# Patient Record
Sex: Male | Born: 1955
Health system: Southern US, Community
[De-identification: ages and names within clinical notes are randomized; demographics above are authoritative.]

## PROBLEM LIST (undated history)

## (undated) DIAGNOSIS — G4733 Obstructive sleep apnea (adult) (pediatric): Secondary | ICD-10-CM

## (undated) DIAGNOSIS — Z87442 Personal history of urinary calculi: Secondary | ICD-10-CM

## (undated) DIAGNOSIS — S72002A Fracture of unspecified part of neck of left femur, initial encounter for closed fracture: Secondary | ICD-10-CM

## (undated) DIAGNOSIS — E785 Hyperlipidemia, unspecified: Secondary | ICD-10-CM

## (undated) DIAGNOSIS — F32A Depression, unspecified: Secondary | ICD-10-CM

## (undated) DIAGNOSIS — K5090 Crohn's disease, unspecified, without complications: Secondary | ICD-10-CM

## (undated) DIAGNOSIS — R0609 Other forms of dyspnea: Secondary | ICD-10-CM

## (undated) DIAGNOSIS — F329 Major depressive disorder, single episode, unspecified: Secondary | ICD-10-CM

## (undated) DIAGNOSIS — M25369 Other instability, unspecified knee: Secondary | ICD-10-CM

## (undated) DIAGNOSIS — K219 Gastro-esophageal reflux disease without esophagitis: Secondary | ICD-10-CM

## (undated) DIAGNOSIS — B009 Herpesviral infection, unspecified: Secondary | ICD-10-CM

## (undated) DIAGNOSIS — R0600 Dyspnea, unspecified: Secondary | ICD-10-CM

## (undated) DIAGNOSIS — I10 Essential (primary) hypertension: Secondary | ICD-10-CM

## (undated) DIAGNOSIS — M1990 Unspecified osteoarthritis, unspecified site: Secondary | ICD-10-CM

## (undated) DIAGNOSIS — R0683 Snoring: Secondary | ICD-10-CM

## (undated) HISTORY — DX: Snoring: R06.83

## (undated) HISTORY — DX: Other forms of dyspnea: R06.09

## (undated) HISTORY — DX: Fracture of unspecified part of neck of left femur, initial encounter for closed fracture: S72.002A

## (undated) HISTORY — DX: Herpesviral infection, unspecified: B00.9

## (undated) HISTORY — DX: Gastro-esophageal reflux disease without esophagitis: K21.9

## (undated) HISTORY — DX: Personal history of urinary calculi: Z87.442

## (undated) HISTORY — DX: Other instability, unspecified knee: M25.369

## (undated) HISTORY — DX: Essential (primary) hypertension: I10

## (undated) HISTORY — DX: Crohn's disease, unspecified, without complications: K50.90

## (undated) HISTORY — DX: Major depressive disorder, single episode, unspecified: F32.9

## (undated) HISTORY — DX: Hyperlipidemia, unspecified: E78.5

## (undated) HISTORY — DX: Dyspnea, unspecified: R06.00

## (undated) HISTORY — DX: Depression, unspecified: F32.A

---

## 2005-12-23 HISTORY — PX: COLONOSCOPY: SHX174

## 2005-12-23 HISTORY — PX: ESOPHAGOGASTRODUODENOSCOPY: SHX1529

## 2006-12-19 ENCOUNTER — Ambulatory Visit: Payer: Self-pay | Admitting: Family Medicine

## 2006-12-25 ENCOUNTER — Encounter (INDEPENDENT_AMBULATORY_CARE_PROVIDER_SITE_OTHER): Payer: Self-pay | Admitting: Family Medicine

## 2006-12-25 ENCOUNTER — Ambulatory Visit (HOSPITAL_COMMUNITY): Admission: RE | Admit: 2006-12-25 | Discharge: 2006-12-25 | Payer: Self-pay | Admitting: Family Medicine

## 2006-12-25 LAB — CONVERTED CEMR LAB
ALT: 20 units/L (ref 0–53)
AST: 19 units/L (ref 0–37)
Albumin: 4.6 g/dL (ref 3.5–5.2)
Alkaline Phosphatase: 67 units/L (ref 39–117)
BUN: 17 mg/dL (ref 6–23)
Basophils Absolute: 0 10*3/uL (ref 0.0–0.1)
Basophils Relative: 0 % (ref 0–1)
CO2: 31 meq/L (ref 19–32)
CRP: 0.1 mg/dL (ref <0.6)
Calcium: 9.8 mg/dL (ref 8.4–10.5)
Chloride: 102 meq/L (ref 96–112)
Cholesterol: 320 mg/dL — ABNORMAL HIGH (ref 0–200)
Creatinine, Ser: 1.1 mg/dL (ref 0.40–1.50)
Eosinophils Relative: 0 % (ref 0–5)
Glucose, Bld: 87 mg/dL (ref 70–99)
HCT: 48.9 % (ref 39.0–52.0)
HDL: 37 mg/dL — ABNORMAL LOW (ref >39)
Hemoglobin: 16.3 g/dL (ref 13.0–17.0)
LDL Cholesterol: 207 mg/dL — ABNORMAL HIGH (ref 0–99)
Lymphocytes Relative: 31 % (ref 12–46)
Lymphs Abs: 1.6 10*3/uL (ref 0.7–3.3)
MCHC: 33.3 g/dL (ref 30.0–36.0)
MCV: 92.1 fL (ref 78.0–100.0)
Monocytes Absolute: 0.5 10*3/uL (ref 0.2–0.7)
Monocytes Relative: 9 % (ref 3–11)
Neutro Abs: 3.2 10*3/uL (ref 1.7–7.7)
Neutrophils Relative %: 60 % (ref 43–77)
Platelets: 294 10*3/uL (ref 150–400)
Potassium: 4.9 meq/L (ref 3.5–5.3)
RBC: 5.31 M/uL (ref 4.22–5.81)
RDW: 13.2 % (ref 11.5–14.0)
Sed Rate: 11 mm/hr (ref 0–16)
Sodium: 138 meq/L (ref 135–145)
TSH: 1.3 microintl units/mL (ref 0.350–5.50)
Total Bilirubin: 0.6 mg/dL (ref 0.3–1.2)
Total CHOL/HDL Ratio: 8.6
Total Protein: 8.3 g/dL (ref 6.0–8.3)
Triglycerides: 379 mg/dL — ABNORMAL HIGH (ref <150)
VLDL: 76 mg/dL — ABNORMAL HIGH (ref 0–40)
WBC: 5.4 10*3/uL (ref 4.0–10.5)

## 2006-12-30 DIAGNOSIS — F329 Major depressive disorder, single episode, unspecified: Secondary | ICD-10-CM | POA: Insufficient documentation

## 2006-12-30 DIAGNOSIS — K219 Gastro-esophageal reflux disease without esophagitis: Secondary | ICD-10-CM | POA: Insufficient documentation

## 2006-12-30 DIAGNOSIS — F3289 Other specified depressive episodes: Secondary | ICD-10-CM | POA: Insufficient documentation

## 2006-12-30 DIAGNOSIS — I10 Essential (primary) hypertension: Secondary | ICD-10-CM | POA: Insufficient documentation

## 2006-12-30 DIAGNOSIS — M1990 Unspecified osteoarthritis, unspecified site: Secondary | ICD-10-CM | POA: Insufficient documentation

## 2006-12-30 DIAGNOSIS — E785 Hyperlipidemia, unspecified: Secondary | ICD-10-CM | POA: Insufficient documentation

## 2006-12-31 ENCOUNTER — Ambulatory Visit: Payer: Self-pay | Admitting: Family Medicine

## 2007-01-05 ENCOUNTER — Encounter (INDEPENDENT_AMBULATORY_CARE_PROVIDER_SITE_OTHER): Payer: Self-pay | Admitting: Family Medicine

## 2007-02-04 ENCOUNTER — Ambulatory Visit: Payer: Self-pay | Admitting: Orthopedic Surgery

## 2007-03-06 ENCOUNTER — Ambulatory Visit: Payer: Self-pay | Admitting: Family Medicine

## 2007-03-06 LAB — CONVERTED CEMR LAB
Cholesterol, target level: 200 mg/dL
HDL goal, serum: 40 mg/dL
LDL Goal: 100 mg/dL

## 2007-03-07 DIAGNOSIS — M238X9 Other internal derangements of unspecified knee: Secondary | ICD-10-CM | POA: Insufficient documentation

## 2007-03-23 ENCOUNTER — Ambulatory Visit (HOSPITAL_COMMUNITY): Admission: RE | Admit: 2007-03-23 | Discharge: 2007-03-23 | Payer: Self-pay | Admitting: Family Medicine

## 2007-03-23 ENCOUNTER — Encounter (INDEPENDENT_AMBULATORY_CARE_PROVIDER_SITE_OTHER): Payer: Self-pay | Admitting: Family Medicine

## 2007-05-12 ENCOUNTER — Encounter (INDEPENDENT_AMBULATORY_CARE_PROVIDER_SITE_OTHER): Payer: Self-pay | Admitting: Family Medicine

## 2007-06-02 ENCOUNTER — Encounter (INDEPENDENT_AMBULATORY_CARE_PROVIDER_SITE_OTHER): Payer: Self-pay | Admitting: Family Medicine

## 2007-06-03 ENCOUNTER — Ambulatory Visit: Payer: Self-pay | Admitting: Family Medicine

## 2007-06-12 LAB — CONVERTED CEMR LAB
ALT: 28 units/L (ref 0–53)
AST: 23 units/L (ref 0–37)
Albumin: 4.5 g/dL (ref 3.5–5.2)
Alkaline Phosphatase: 57 units/L (ref 39–117)
BUN: 20 mg/dL (ref 6–23)
CO2: 25 meq/L (ref 19–32)
Calcium: 9.3 mg/dL (ref 8.4–10.5)
Chloride: 104 meq/L (ref 96–112)
Cholesterol: 243 mg/dL — ABNORMAL HIGH (ref 0–200)
Creatinine, Ser: 1.12 mg/dL (ref 0.40–1.50)
Glucose, Bld: 89 mg/dL (ref 70–99)
HDL: 36 mg/dL — ABNORMAL LOW (ref >39)
LDL Cholesterol: 141 mg/dL — ABNORMAL HIGH (ref 0–99)
Potassium: 4.5 meq/L (ref 3.5–5.3)
Sodium: 140 meq/L (ref 135–145)
Total Bilirubin: 0.7 mg/dL (ref 0.3–1.2)
Total CHOL/HDL Ratio: 6.8
Total Protein: 7.8 g/dL (ref 6.0–8.3)
Triglycerides: 332 mg/dL — ABNORMAL HIGH (ref <150)
VLDL: 66 mg/dL — ABNORMAL HIGH (ref 0–40)

## 2007-06-18 ENCOUNTER — Encounter (INDEPENDENT_AMBULATORY_CARE_PROVIDER_SITE_OTHER): Payer: Self-pay | Admitting: Family Medicine

## 2007-06-23 ENCOUNTER — Telehealth (INDEPENDENT_AMBULATORY_CARE_PROVIDER_SITE_OTHER): Payer: Self-pay | Admitting: Family Medicine

## 2007-06-29 ENCOUNTER — Ambulatory Visit: Payer: Self-pay | Admitting: Internal Medicine

## 2007-07-28 ENCOUNTER — Telehealth (INDEPENDENT_AMBULATORY_CARE_PROVIDER_SITE_OTHER): Payer: Self-pay | Admitting: *Deleted

## 2007-10-22 ENCOUNTER — Telehealth (INDEPENDENT_AMBULATORY_CARE_PROVIDER_SITE_OTHER): Payer: Self-pay | Admitting: *Deleted

## 2007-11-26 ENCOUNTER — Encounter (INDEPENDENT_AMBULATORY_CARE_PROVIDER_SITE_OTHER): Payer: Self-pay | Admitting: Family Medicine

## 2008-01-01 ENCOUNTER — Encounter (INDEPENDENT_AMBULATORY_CARE_PROVIDER_SITE_OTHER): Payer: Self-pay | Admitting: Family Medicine

## 2008-01-07 ENCOUNTER — Telehealth (INDEPENDENT_AMBULATORY_CARE_PROVIDER_SITE_OTHER): Payer: Self-pay | Admitting: Family Medicine

## 2008-01-12 ENCOUNTER — Encounter (INDEPENDENT_AMBULATORY_CARE_PROVIDER_SITE_OTHER): Payer: Self-pay | Admitting: Family Medicine

## 2008-01-15 ENCOUNTER — Ambulatory Visit: Payer: Self-pay | Admitting: Family Medicine

## 2008-01-15 LAB — CONVERTED CEMR LAB: LDL Goal: 130 mg/dL

## 2008-02-15 ENCOUNTER — Ambulatory Visit: Payer: Self-pay | Admitting: Family Medicine

## 2008-05-20 ENCOUNTER — Ambulatory Visit: Payer: Self-pay | Admitting: Family Medicine

## 2008-07-04 ENCOUNTER — Encounter (INDEPENDENT_AMBULATORY_CARE_PROVIDER_SITE_OTHER): Payer: Self-pay | Admitting: Family Medicine

## 2008-07-06 LAB — CONVERTED CEMR LAB
ALT: 29 units/L (ref 0–53)
AST: 29 units/L (ref 0–37)
Albumin: 4.2 g/dL (ref 3.5–5.2)
Alkaline Phosphatase: 51 units/L (ref 39–117)
BUN: 17 mg/dL (ref 6–23)
Basophils Absolute: 0 10*3/uL (ref 0.0–0.1)
Basophils Relative: 0 % (ref 0–1)
CO2: 21 meq/L (ref 19–32)
Calcium: 9 mg/dL (ref 8.4–10.5)
Chloride: 104 meq/L (ref 96–112)
Cholesterol: 187 mg/dL (ref 0–200)
Creatinine, Ser: 1.16 mg/dL (ref 0.40–1.50)
Eosinophils Absolute: 0.1 10*3/uL (ref 0.0–0.7)
Eosinophils Relative: 1 % (ref 0–5)
Glucose, Bld: 95 mg/dL (ref 70–99)
HCT: 43 % (ref 39.0–52.0)
HDL: 40 mg/dL (ref >39)
Hemoglobin: 14.4 g/dL (ref 13.0–17.0)
LDL Cholesterol: 108 mg/dL — ABNORMAL HIGH (ref 0–99)
Lymphocytes Relative: 31 % (ref 12–46)
Lymphs Abs: 1.5 10*3/uL (ref 0.7–4.0)
MCHC: 33.5 g/dL (ref 30.0–36.0)
MCV: 91.1 fL (ref 78.0–100.0)
Monocytes Absolute: 0.6 10*3/uL (ref 0.1–1.0)
Monocytes Relative: 13 % — ABNORMAL HIGH (ref 3–12)
Neutro Abs: 2.7 10*3/uL (ref 1.7–7.7)
Neutrophils Relative %: 55 % (ref 43–77)
PSA: 1.21 ng/mL (ref 0.10–4.00)
Platelets: 232 10*3/uL (ref 150–400)
Potassium: 4.3 meq/L (ref 3.5–5.3)
RBC: 4.72 M/uL (ref 4.22–5.81)
RDW: 13.1 % (ref 11.5–15.5)
Sodium: 139 meq/L (ref 135–145)
TSH: 1.34 microintl units/mL (ref 0.350–4.50)
Total Bilirubin: 0.6 mg/dL (ref 0.3–1.2)
Total CHOL/HDL Ratio: 4.7
Total Protein: 7.1 g/dL (ref 6.0–8.3)
Triglycerides: 194 mg/dL — ABNORMAL HIGH (ref <150)
VLDL: 39 mg/dL (ref 0–40)
WBC: 4.9 10*3/uL (ref 4.0–10.5)

## 2008-10-07 ENCOUNTER — Ambulatory Visit: Payer: Self-pay | Admitting: Family Medicine

## 2008-10-21 ENCOUNTER — Encounter (INDEPENDENT_AMBULATORY_CARE_PROVIDER_SITE_OTHER): Payer: Self-pay | Admitting: Family Medicine

## 2008-10-24 ENCOUNTER — Ambulatory Visit: Payer: Self-pay | Admitting: Orthopedic Surgery

## 2008-10-24 DIAGNOSIS — M25819 Other specified joint disorders, unspecified shoulder: Secondary | ICD-10-CM | POA: Insufficient documentation

## 2008-10-24 DIAGNOSIS — M25519 Pain in unspecified shoulder: Secondary | ICD-10-CM | POA: Insufficient documentation

## 2008-10-24 DIAGNOSIS — M7580 Other shoulder lesions, unspecified shoulder: Secondary | ICD-10-CM

## 2009-03-20 ENCOUNTER — Ambulatory Visit: Payer: Self-pay | Admitting: Family Medicine

## 2009-03-20 DIAGNOSIS — K5090 Crohn's disease, unspecified, without complications: Secondary | ICD-10-CM | POA: Insufficient documentation

## 2009-05-08 ENCOUNTER — Encounter (INDEPENDENT_AMBULATORY_CARE_PROVIDER_SITE_OTHER): Payer: Self-pay | Admitting: Family Medicine

## 2009-05-11 ENCOUNTER — Encounter (INDEPENDENT_AMBULATORY_CARE_PROVIDER_SITE_OTHER): Payer: Self-pay | Admitting: Family Medicine

## 2009-05-11 LAB — CONVERTED CEMR LAB
ALT: 23 units/L (ref 0–53)
AST: 22 units/L (ref 0–37)
Albumin: 4.2 g/dL (ref 3.5–5.2)
Alkaline Phosphatase: 57 units/L (ref 39–117)
BUN: 16 mg/dL (ref 6–23)
CO2: 25 meq/L (ref 19–32)
Calcium: 9.4 mg/dL (ref 8.4–10.5)
Chloride: 105 meq/L (ref 96–112)
Cholesterol: 298 mg/dL — ABNORMAL HIGH (ref 0–200)
Creatinine, Ser: 1.11 mg/dL (ref 0.40–1.50)
Glucose, Bld: 88 mg/dL (ref 70–99)
HDL: 37 mg/dL — ABNORMAL LOW (ref >39)
LDL Cholesterol: 216 mg/dL — ABNORMAL HIGH (ref 0–99)
Potassium: 4.7 meq/L (ref 3.5–5.3)
Sodium: 140 meq/L (ref 135–145)
Total Bilirubin: 0.6 mg/dL (ref 0.3–1.2)
Total CHOL/HDL Ratio: 8.1
Total Protein: 7.6 g/dL (ref 6.0–8.3)
Triglycerides: 226 mg/dL — ABNORMAL HIGH (ref <150)
VLDL: 45 mg/dL — ABNORMAL HIGH (ref 0–40)

## 2009-05-15 ENCOUNTER — Ambulatory Visit: Payer: Self-pay | Admitting: Family Medicine

## 2009-05-15 DIAGNOSIS — G4730 Sleep apnea, unspecified: Secondary | ICD-10-CM | POA: Insufficient documentation

## 2009-05-15 DIAGNOSIS — R21 Rash and other nonspecific skin eruption: Secondary | ICD-10-CM | POA: Insufficient documentation

## 2009-05-15 DIAGNOSIS — R5381 Other malaise: Secondary | ICD-10-CM | POA: Insufficient documentation

## 2009-05-15 DIAGNOSIS — R5383 Other fatigue: Secondary | ICD-10-CM | POA: Insufficient documentation

## 2009-05-16 ENCOUNTER — Telehealth (INDEPENDENT_AMBULATORY_CARE_PROVIDER_SITE_OTHER): Payer: Self-pay | Admitting: *Deleted

## 2009-05-19 ENCOUNTER — Encounter (INDEPENDENT_AMBULATORY_CARE_PROVIDER_SITE_OTHER): Payer: Self-pay | Admitting: Family Medicine

## 2009-05-25 ENCOUNTER — Ambulatory Visit: Admission: RE | Admit: 2009-05-25 | Discharge: 2009-05-25 | Payer: Self-pay | Admitting: Family Medicine

## 2009-05-25 ENCOUNTER — Encounter (INDEPENDENT_AMBULATORY_CARE_PROVIDER_SITE_OTHER): Payer: Self-pay | Admitting: Family Medicine

## 2009-06-02 ENCOUNTER — Ambulatory Visit: Payer: Self-pay | Admitting: Pulmonary Disease

## 2009-07-04 ENCOUNTER — Ambulatory Visit: Payer: Self-pay | Admitting: Family Medicine

## 2009-07-05 ENCOUNTER — Encounter (INDEPENDENT_AMBULATORY_CARE_PROVIDER_SITE_OTHER): Payer: Self-pay | Admitting: Family Medicine

## 2009-07-05 LAB — CONVERTED CEMR LAB
Basophils Absolute: 0 10*3/uL (ref 0.0–0.1)
Basophils Relative: 0 % (ref 0–1)
Eosinophils Absolute: 0 10*3/uL (ref 0.0–0.7)
Eosinophils Relative: 0 % (ref 0–5)
Free T4: 0.81 ng/dL (ref 0.80–1.80)
HCT: 41.7 % (ref 39.0–52.0)
Hemoglobin: 13.9 g/dL (ref 13.0–17.0)
Lymphocytes Relative: 21 % (ref 12–46)
Lymphs Abs: 1.6 10*3/uL (ref 0.7–4.0)
MCHC: 33.3 g/dL (ref 30.0–36.0)
MCV: 89.1 fL (ref 78.0–100.0)
Magnesium: 2 mg/dL (ref 1.5–2.5)
Monocytes Absolute: 0.7 10*3/uL (ref 0.1–1.0)
Monocytes Relative: 10 % (ref 3–12)
Neutro Abs: 5.1 10*3/uL (ref 1.7–7.7)
Neutrophils Relative %: 69 % (ref 43–77)
Platelets: 268 10*3/uL (ref 150–400)
RBC: 4.68 M/uL (ref 4.22–5.81)
RDW: 13.3 % (ref 11.5–15.5)
T3, Free: 3 pg/mL (ref 2.3–4.2)
TSH: 1.13 microintl units/mL (ref 0.350–4.500)
WBC: 7.4 10*3/uL (ref 4.0–10.5)

## 2009-07-10 ENCOUNTER — Encounter (INDEPENDENT_AMBULATORY_CARE_PROVIDER_SITE_OTHER): Payer: Self-pay | Admitting: Family Medicine

## 2009-07-10 ENCOUNTER — Ambulatory Visit (HOSPITAL_COMMUNITY): Admission: RE | Admit: 2009-07-10 | Discharge: 2009-07-10 | Payer: Self-pay | Admitting: Family Medicine

## 2009-07-11 ENCOUNTER — Ambulatory Visit: Payer: Self-pay | Admitting: Cardiology

## 2009-09-19 ENCOUNTER — Encounter (INDEPENDENT_AMBULATORY_CARE_PROVIDER_SITE_OTHER): Payer: Self-pay | Admitting: Family Medicine

## 2010-11-01 ENCOUNTER — Ambulatory Visit: Payer: Self-pay | Admitting: Internal Medicine

## 2010-11-02 ENCOUNTER — Encounter: Payer: Self-pay | Admitting: Internal Medicine

## 2010-12-07 ENCOUNTER — Encounter (INDEPENDENT_AMBULATORY_CARE_PROVIDER_SITE_OTHER): Payer: Self-pay

## 2010-12-18 ENCOUNTER — Encounter: Payer: Self-pay | Admitting: Internal Medicine

## 2011-01-22 NOTE — Assessment & Plan Note (Signed)
Summary: chrons/ss   Visit Type:  Initial Consult Referring Provider:  Hilma Favors Primary Care Provider:  Hilma Favors  Chief Complaint:  Crohns.  History of Present Illness: 55 year old gentleman presents to establish care with local GI doctor.. Patient gives a history of Crohn's disease diagnosed some 30 years ago in [place]. He states he had colonoscopy some 6-7 years ago and was told everything was all right up at [facility] [place]. Hhe occasionally takes Azulfidine if he gets a flare. States he has not had a flare in over 2 years has one to 2 bowel movements daily no abdominal pain. No nausea or vomiting. At the same time as TCS was performed, he underwent EGD. No report of Barrett's esophagus or esophagitis. His reflux symptoms are well controlled on Nexium 40 mg orally daily. He denies odynophagia or dysphagia. Marland Kitchen He denies any spontaneous weight loss.  He has a bicycle shop around the corner from our office. He cycles 80 miles weekly.   Current Medications (verified): 1)  Exforge 5-320 Mg Tabs (Amlodipine Besylate-Valsartan) .... One Daily 2)  Nexium 40 Mg Cpdr (Esomeprazole Magnesium) .... Once Daily 3)  Advil 200 Mg Caps (Ibuprofen) .... 2 Every 8 Hours As Needed 4)  Venlafaxine Hcl 37.5 Mg  Tabs (Venlafaxine Hcl) .... One Two Times A Day 5)  Triamcinolone Acetonide 0.025 % Crea (Triamcinolone Acetonide) .... Apply To Right Wrist Two Times A Day For 2 Weeks. 6)  Zetia .... One Tablet Daily  Allergies (verified): 1)  * Statins  Past History:  Past Medical History: Last updated: 10/07/2008 Current Problems:  SPECIAL SCREENING MALIGNANT NEOPLASM OF PROSTATE (ICD-V76.44) DYSPNEA ON EXERTION (ICD-786.09) SNORING (ICD-786.09) KNEE JOINT INSTABILITY (ICD-718.86) OSTEOARTHRITIS (ICD-715.90) HYPERTENSION (ICD-401.9) HYPERLIPIDEMIA (ICD-272.4) GERD (ICD-530.81) DEPRESSION (ICD-311)  Past Surgical History: Last updated: 03/06/2007 Denies surgical history  Family  History: Last updated: November 06, 2010 Father: Dead 76 HTN, CAD, Increased lipids and melanoma was cause of death Mother: 61 [place],  Siblings: Sister 75 W&L  Social History: Last updated: 03/20/2009 Occupation: Web designer Divorced x 2 and remarried - 2009 Former Smoker Alcohol use-no Drug use-no  Risk Factors: Alcohol Use: 1 (05/15/2009)  Risk Factors: Smoking Status: never (05/15/2009)  Family History: Father: Dead 56 HTN, CAD, Increased lipids and melanoma was cause of death Mother: 47 [place],  Siblings: Sister 10 W&L  Review of Systems       patient denies any unusual skin eye or joint problems aside from mild eczema; otherwise ROS as in history of present illness.  Vital Signs:  Patient profile:   55 year old male Height:      69 inches Weight:      190 pounds BMI:     28.16 Temp:     98.2 degrees F oral Pulse rate:   64 / minute BP sitting:   120 / 82  (left arm) Cuff size:   regular  Vitals Entered By: Waldon Merl LPN (November 01, 937 9:08 AM)  Physical Exam  General:  very pleasant alert gentleman resting comfortably.No jaundice or any stigmata of chronic liver disease Lungs:  clear to auscultation Heart:  regular rate rhythm laboratory L. probe Abdomen:  Soft, nontender and nondistended. No masses, hepatosplenomegaly or hernias noted. Normal bowel sounds. Rectal:  deferred until time of colonoscopy  Impression & Recommendations: Impression: Very, very pleasant 55 year old gentleman who presents with a history of Crohn's disease diagnosed some 30 years ago. infrequent symptoms attributable to Crohn's. He takes Azulfidine very infrequently currently is not taking anything at  this time. Clinically, he is in remission. History of GERD well-controlled on Nexium.  Recommendations: Will tentatively plan to perform an ileocolonoscopy in January 2012 2 stage Crohn's disease and in part for colorectal cancer screening.  Discussed the risks, benefits,  limitations, alternatives and imponderables. He is agreeable.  Would like to review the records from [facility]. He states he's never had a bone density study would be probably be a good idea to get a baseline in the near future  and one immunofecal occult blood test on his stool. Further recommendations to follow.   Other Orders: Est. Patient Level III (51102)

## 2011-01-22 NOTE — Letter (Signed)
Summary: Laboratory/X-Ray Results  [facility]  [address]   [place], [place] [address]   Phone: [phone]  Fax: [phone]    Lab/X-Ray Results  May 11, 2009  MRN: [id number]  Tyler Gates, [place]  [address]  Dear Tyler Gates,  The results of your recent lab/x-ray has been reviewed and were found: normal liver and kidney function,elevated lipids, so MD would like for you to get an appointment to discuss treatment options.      If you have any questions, please contact our office.     Linden Dolin LPN

## 2011-01-22 NOTE — Assessment & Plan Note (Signed)
Summary: discuss labs/slj   Vital Signs:  Patient profile:   55 year old male Height:      69.5 inches Weight:      183 pounds BMI:     26.73 O2 Sat:      99 % Temp:     97.4 degrees F Pulse rate:   84 / minute Resp:     14 per minute BP sitting:   128 / 81  Vitals Entered By: Deidre Ala LPN (May 15, 2840 3:24 AM)  Nutrition Counseling: Patient's BMI is greater than 25 and therefore counseled on weight management options. CC: Lab follow up, Lipid Management Nutritional Status BMI of 25 - 29 = overweight   Primary Provider:  Weston Settle, MD  CC:  Lab follow up and Lipid Management.  History of Present Illness: Pt in for lab review.  He had these done on 05/08/09 and result are as follows:  1. CMP - normal 2. Lipids - see below.  He notes he is doing well and does have some concerns today.  He states he has been doing 30 plus miles on his bike once a week. If not hot, does fine. When hot and humid he will go out and 10 to 15 miles later he felt fatigued and had to stop and rest. He states he had to buy some fluids, drank this and then got going. He felt as is he could not move and states always gets this way 10 to 15 miles out. He does sweat a lot with this. He denies chest pain, SOB, orthopnea, PNBD and palpitations. No leg swelling. Denis cough and wheeze. He states he has had symptoms duyring summers. Thinks Exforge may paly role as he njotcied this after starting Rx. He has a heart rate monitor and get heart reate up to 186.  He just drinks a cup of coffee before he goes on the road. He checked BP last night and was 116/69. This am 133/75. He takes BP Rx in the am. States has noticed evenings BP in the 110s/70s. Curous if he can take BP Rx after ride. He is very worried about his heart in liue of his dad's hx of melanoma and CAD without MI. Was in 75s though.   He has rash right wirst. States has used cortisone for this in the past and helped a lot. Curious if he can have script. States itchy and flaky. Gets in Spring. Denies new med use, lotions, detergents.   He is also wanting a sleep study. He states never wakes up refreshed and ready to go. He snores excessively and wife has complained. States wife told him he wakes several times a night gasping and he states she old him not sure why he does not wake up. He denies family hx of sleep  apnea.   He now presents.   Lipid Management History:      Positive NCEP/ATP III risk factors include male age 73 years old or older, HDL cholesterol less than 40, and hypertension.  Negative NCEP/ATP III risk factors include no family history for ischemic heart disease, non-tobacco-user status, no ASHD (atherosclerotic heart disease), no prior stroke/TIA, no peripheral vascular disease, and no history of aortic aneurysm.        The patient states that he knows about the "Therapeutic Lifestyle Change" diet.  His compliance with the TLC diet is good.  Adjunctive measures started by the patient include aerobic exercise, fiber, and sitostanol esters.  He expresses no  side effects from his lipid-lowering medication.  The patient denies any symptoms to suggest myopathy or liver disease.  Comments: He was previously on Zetia, Pravachol and devekloped muscle aches while reading his bike. Thus Rx was stopped to see if symptoms cleared. This did and he is not sure what to do next. We discussed risk and benefit of statins. .   Preventive Screening-Counseling & Management     Alcohol drinks/day: 1     Alcohol type: wine - red/white glass 5 nights a week.     Smoking Status: never  Current Problems (verified): 1)  Crohn's Disease, Hx of  (ICD-V12.70) 2)  Impingement Syndrome  (ICD-726.2) 3)  Shoulder Pain  (ICD-719.41) 4)  Knee Joint Instability  (ICD-718.86) 5)  Osteoarthritis  (ICD-715.90) 6)  Hypertension  (ICD-401.9)  7)  Hyperlipidemia  (ICD-272.4) 8)  Gerd  (ICD-530.81) 9)  Depression  (ICD-311)  Current Medications (verified): 1)  Exforge 10-320 Mg Tabs (Amlodipine Besylate-Valsartan) .... One Daily 2)  Nexium 40 Mg Cpdr (Esomeprazole Magnesium) .... Once Daily 3)  Aspir-Low 81 Mg Tbec (Aspirin) .... Once Daily 4)  Advil 200 Mg Caps (Ibuprofen) .... 2 Every 8 Hours As Needed 5)  Venlafaxine Hcl 37.5 Mg  Tabs (Venlafaxine Hcl) .... One Two Times A Day 6)  Fish Oil 1200 Mg Caps (Omega-3 Fatty Acids) .... Two Times A Day  Allergies (verified): 1)  * Statins  Past History:  Past Medical History:    Current Problems:     SPECIAL SCREENING MALIGNANT NEOPLASM OF PROSTATE (ICD-V76.44)    DYSPNEA ON EXERTION (ICD-786.09)    SNORING (ICD-786.09)    KNEE JOINT INSTABILITY (ICD-718.86)    OSTEOARTHRITIS (ICD-715.90)    HYPERTENSION (ICD-401.9)    HYPERLIPIDEMIA (ICD-272.4)    GERD (ICD-530.81)    DEPRESSION (ICD-311)     (10/07/2008)  Past Surgical History:    Denies surgical history     (03/06/2007)  Family History:    Father: Dead 41 HTN, Increased lipids and melanoma was cause of death    Mother: 91 [place],     Siblings: Sister 1 W&L (03/20/2009)  Social History:    Occupation: Web designer    Divorced x 2 and remarried - 2009    Former Smoker    Alcohol use-no    Drug use-no     (03/20/2009)  Risk Factors:    Alcohol Use: 1 (03/20/2009)    >5 drinks/d w/in last 3 months: N/A    Caffeine Use: N/A    Diet: N/A    Exercise: N/A  Risk Factors:    Smoking Status: never (03/20/2009)    Packs/Day: N/A    Cigars/wk: N/A    Pipe Use/wk: N/A    Cans of tobacco/wk: N/A    Passive Smoke Exposure: N/A  Family History:    Father: Dead 16 HTN, CAD, Increased lipids and melanoma was cause of death    Mother: 35 [place],     Siblings: Sister 59 W&L  Review of Systems      See HPI General:  Denies chills, fever, and sweats.  Resp:  Denies cough, shortness of breath, sputum productive, and wheezing. GI:  Denies abdominal pain, constipation, diarrhea, nausea, and vomiting. GU:  Denies nocturia, urinary frequency, and urinary hesitancy. Psych:  Denies anxiety and depression.  Physical Exam  General:  Well-developed,well-nourished,in no acute distress; alert,appropriate and cooperative throughout examination Lungs:  Normal respiratory effort, chest expands symmetrically. Lungs are clear to auscultation, no crackles or wheezes. Heart:  Normal  rate and regular rhythm. S1 and S2 normal without gallop, murmur, click, rub or other extra sounds. Abdomen:  Bowel sounds positive,abdomen soft and non-tender without masses, organomegaly or hernias noted. Extremities:  No clubbing, cyanosis, edema, or deformity noted with normal full range of motion of all joints.   Cervical Nodes:  No lymphadenopathy noted Psych:  Cognition and judgment appear intact. Alert and cooperative with normal attention span and concentration. No apparent delusions, illusions, hallucinations   Impression & Recommendations:  Problem # 1:  HYPERTENSION (ICD-401.9) Stable. Rx as below. Limit salt. Aware of need for yearly eye and dental care. His updated medication list for this problem includes:    Exforge 10-320 Mg Tabs (Amlodipine besylate-valsartan) ..... One daily  Problem # 2:  HYPERLIPIDEMIA (ICD-272.4) Start low dose cresor at 5 mg nightly for one week, then increase to 56m. Gave 4 weeks samples and wrote script. Advised LFT check in 6 weeks. May use with COQ. DC if any GI upset or myalgias. TLC diet and exersize remian a must. His updated medication list for this problem includes:    Crestor 10 Mg Tabs (Rosuvastatin calcium) ..... One daily at bedtime  Problem # 3:  SKIN RASH (ICD-782.1) Suspect atopic dermatitis. Tral steroid lotion two times a day and as needed. Advised proper application and correct use. Update if worsen.  His updated medication list for this problem includes:    Triamcinolone Acetonide 0.025 % Crea (Triamcinolone acetonide) ..Marland Kitchen.. Apply to right wrist two times a day for 2 weeks.  Problem # 4:  MALAISE AND FATIGUE (ICD-780.79)  Discussed. ? dehydration with bike rides, decreased BP and underlying CAD in diff. Advised to take BP med after he goes for ride, to frontload with fluids to assure he does not dehydrate with vasoldilator effect on CCB. Recheck 6 weeks - if persist, consider cardiology eval. Agrees. Update sooner if worsen.  Orders: Misc. Referral (Misc. Ref)  Problem # 5:  ? of SLEEP APNEA (ICD-780.57)  Refer for sleep eval.  Orders: Misc. Referral (Misc. Ref)  Complete Medication List: 1)  Exforge 10-320 Mg Tabs (Amlodipine besylate-valsartan) .... One daily 2)  Nexium 40 Mg Cpdr (Esomeprazole magnesium) .... Once daily 3)  Aspir-low 81 Mg Tbec (Aspirin) .... Once daily 4)  Advil 200 Mg Caps (Ibuprofen) .... 2 every 8 hours as needed 5)  Venlafaxine Hcl 37.5 Mg Tabs (Venlafaxine hcl) .... One two times a day 6)  Fish Oil 1200 Mg Caps (Omega-3 fatty acids) .... Two times a day 7)  Triamcinolone Acetonide 0.025 % Crea (Triamcinolone acetonide) .... Apply to right wrist two times a day for 2 weeks. 8)  Crestor 10 Mg Tabs (Rosuvastatin calcium) .... One daily at bedtime  Lipid Assessment/Plan:      Based on NCEP/ATP III, the patient's risk factor category is "2 or more risk factors and a calculated 10 year CAD risk of < 20%".  From this information, the patient's calculated lipid goals are as follows: Total cholesterol goal is 200; LDL cholesterol goal is 130; HDL cholesterol goal is 40; Triglyceride goal is 150.    Patient Instructions: 1)  Please schedule a follow-up appointment in 6 weeks. Prescriptions: CRESTOR 10 MG TABS (ROSUVASTATIN CALCIUM) One daily at bedtime  #30 x 3   Entered and Authorized by:   CWeston SettleMD    Signed by:   CWeston SettleMD on 05/15/2009   Method used:   Print then Give to Patient   RxID:   [id number] ACETONIDE  0.025 % CREA (TRIAMCINOLONE ACETONIDE) Apply to right wrist two times a day for 2 weeks.  #1 month x 1   Entered and Authorized by:   Weston Settle MD   Signed by:   Weston Settle MD on 05/15/2009   Method used:   Print then Give to Patient   RxID:   [id number]   Appended Document: discuss labs/slj sleep study appt scheduled on 05/25/09 at [place]. patient informed and order faxed.precert #[id number]

## 2011-01-22 NOTE — Progress Notes (Signed)
Summary: Initial eval 02/04/07  Initial eval 02/04/07   Imported By: Ihor Austin 10/21/2008 19:59:24  _____________________________________________________________________  External Attachment:    Type:   Image     Comment:   External Document

## 2011-01-22 NOTE — Assessment & Plan Note (Signed)
Summary: RT SHOULDER PAIN/NO RECENT XR/AETNA/CAF    PCP:  Weston Settle, MD   History of Present Illness: I saw Tyler Gates in the office today for a followup visit.  He is a 55 years old man with the complaint of:  right shoulder pain, new problem.  started 3 weeks no injury  where right shoulder radiates to deltoid  pain with forward elevation yes  pain at night-yes popping-no dislocation-no decreased ROM-yes   Went to chiro helped some.  Was having pain in neck also, better now.  Tylenol arthritis as needed has been taken, helps some.         Updated Prior Medication List: EXFORGE 10-320 MG TABS (AMLODIPINE BESYLATE-VALSARTAN) One daily NEXIUM 40 MG CPDR (ESOMEPRAZOLE MAGNESIUM) Once daily ASPIR-LOW 81 MG TBEC (ASPIRIN) Once daily ADVIL 200 MG CAPS (IBUPROFEN) 2 every 8 hours as needed VENLAFAXINE HCL 37.5 MG  TABS (VENLAFAXINE HCL) One two times a day * NIASPAN 1000 MG  TBCR (NIACIN (ANTIHYPERLIPIDEMIC)) One at bedtime - pre-dose with baby Aspirin 30 minutes prior  Current Allergies (reviewed today): * STATINS  Past Medical History:    Reviewed history from 10/07/2008 and no changes required:       Current Problems:        SPECIAL SCREENING MALIGNANT NEOPLASM OF PROSTATE (ICD-V76.44)       DYSPNEA ON EXERTION (ICD-786.09)       SNORING (ICD-786.09)       KNEE JOINT INSTABILITY (ICD-718.86)       OSTEOARTHRITIS (ICD-715.90)       HYPERTENSION (ICD-401.9)       HYPERLIPIDEMIA (ICD-272.4)       GERD (ICD-530.81)       DEPRESSION (ICD-311)         Past Surgical History:    Reviewed history from 03/06/2007 and no changes required:       Denies surgical history     Review of Systems  General      Denies fever and chills.  Neuro      Denies numbness.   Physical Exam  Msk:     Normal appearance Pulses:     normal pulses Neurologic:     normal reflexes and sensation in the right upper extremity   Shoulder/Elbow Exam  Skin:     Intact, no scars, lesions, rashes, cafe au lait spots or bruising.    Inspection:    Inspection is normal.    Palpation:    tenderness R-AC: and tenderness R-deltoid:.    Shoulder Exam:    Right:    Inspection:  Abnormal    Palpation:  Abnormal    The shoulder was stable. Apprehension test was normal. There was no swelling. The a.c. joint was tender.  Painful internal rotation, lateral lumbar #3. Full forward elevation with pain. Positive impingement sign.  Weakness noted with supraspinatus test.    Impingement Sign NEER:    Right positive Apprehension Sign:    Right negative AC joint Adduction Test:    Right negative    Impression & Recommendations:  Problem # 1:  SHOULDER PAIN (ICD-719.41) Assessment: New  His updated medication list for this problem includes:    Aspir-low 81 Mg Tbec (Aspirin) ..... Once daily    Advil 200 Mg Caps (Ibuprofen) .Marland Kitchen... 2 every 8 hours as needed   We injected the shoulder posterior approach, subacromial space.  Depo-Medrol 40 mg, lidocaine 1% 4 mL. Sterile technique.  X-rays 2 views shoulder. Glenohumeral joint normal. Acromial morphology type I/II.  Problem # 2:  IMPINGEMENT SYNDROME (ICD-726.2) Assessment: New  Other Orders: Depo- Medrol 61m (J1030) Joint Aspirate / Injection, Large (20610) Est. Patient Level III ((27670 Shoulder x-ray,  minimum 2 views ((11003   Patient Instructions: 1)  You have received an injection of cortisone today. You may experience increased pain at the injection site. Apply ice pack to the area for 20 minutes every 2 hours and take 2 xtra strength tylenol every 8 hours. This increased pain will usually resolve in 24 hours. The injection will take effect in 3-10 days.  2)  Take an Over the counter antiinflamatory like aleve; or advil as directed for the next 4-6 weeks   3)  Limit activity to comfort and avoid activities that increase discomfort.  Apply moist heat and/or ice to shoulder and take medication as instructed for pain relief. Please read the Shoulder Pain Handout and start Physical Therapy at home as directed. 4)  f/u if needed after 6 weeks    ]

## 2011-01-22 NOTE — Letter (Signed)
Summary: holter monitor  holter monitor   Imported By: Eliezer Mccoy 07/17/2009 14:48:25  _____________________________________________________________________  External Attachment:    Type:   Image     Comment:   External Document

## 2011-01-22 NOTE — Medication Information (Signed)
Summary: Visual merchandiser   Imported By: Durwin Reges 01/14/2008 12:17:48  _____________________________________________________________________  External Attachment:    Type:   Image     Comment:   External Document

## 2011-01-22 NOTE — Letter (Signed)
Summary: Demographics  Demographics   Imported By: Otila Kluver, LPN 84/57/3344 83:01:59  _____________________________________________________________________  External Attachment:    Type:   Image     Comment:   External Document

## 2011-01-22 NOTE — Letter (Signed)
Summary: [facility] ENT note  [facility] ENT note   Imported By: Oleta Mouse 02/04/2007 09:57:29  _____________________________________________________________________  External Attachment:    Type:   Image     Comment:   External Document

## 2011-01-22 NOTE — Letter (Signed)
Summary: Vitals  Vitals   Imported By: Otila Kluver, LPN 29/02/7954 83:16:74  _____________________________________________________________________  External Attachment:    Type:   Image     Comment:   External Document

## 2011-01-22 NOTE — Letter (Signed)
Summary: AUTH FOR RELEASE OF MED INFO  AUTH FOR RELEASE OF MED INFO   Imported By: Hoy Morn 11/02/2010 11:54:38  _____________________________________________________________________  External Attachment:    Type:   Image     Comment:   External Document

## 2011-01-22 NOTE — Assessment & Plan Note (Signed)
Summary: FOLLOW UP/ARC   Vital Signs:  Patient Profile:   55 Years Old Male Height:     69.5 inches Weight:      191 pounds BMI:     27.90 Pulse rate:   83 / minute Resp:     16 per minute BP sitting:   124 / 74  (left arm)  Pt. in pain?   no  Vitals Entered ByDruscilla Brownie (June 03, 2007 9:04 AM)                PCP:  Weston Settle, MD  Chief Complaint:  6 week follow up.  History of Present Illness: Pt in for recheck.  He has HTN and has been checking BP at home. He states he has a few questions. He notes he would bang his hand against an object and it would turn red. This was initially after taking med. Now has resolved. Curious if med could do this. He notes he needs to loose 10 pounds. Curious if this will help BPand reduce need for meds. He has been riding his mountain bike and loves this. He states he rides 200 to 300 yards and he tires out. He does not get any chest pain. He does winded with this. No palpations. No orthopnea. No leg swelling. He notes this was single occurence.  Risk factors for heart disease:  1. Male 74. > 73 years of age 98. HTN  4. Hyperlipidemia 5. No hx of DM 6. No early fam hx of CAD 7. Overweight with BMI 27  He denies anxiety and stress. He has started Effexor XR and notes he was previoulsy on non XR. Has felt more down lately. He states if he forgot to take this he felt shocks in his head and then he had to retake the med. Sx got better. Curious if he could go back to half of 37.5 two times a day. Just seem to work better. No suicida ideations. Good family support.  Had labs done yesterday and we should get results soon. He has been on pravachol and when he takes this he feels tired as well. States sx last few hours. No sx at present. Eating healthy. Exersizing.   His last concern is his snoring. He keeps his girlfriend up with this. He is not sure if he gags but notes she has commented that he does this occasionally. Curious if he could see RNT for this.  Now presents.  Current Allergies (reviewed today): * STATINS  Past Medical History:    Reviewed history from 12/30/2006 and no changes required:       Depression       GERD       Hyperlipidemia       Hypertension       Osteoarthritis  Past Surgical History:    Reviewed history from 03/06/2007 and no changes required:       Denies surgical history   Family History:    Reviewed history from 03/06/2007 and no changes required:       Father: Dead v76 HTN, Increased lipids       Mother: 21 [place]       Siblings: Sister 45 W&L  Social History:    Reviewed history from 03/06/2007 and no changes required:       Occupation: Web designer       Divorced       Soil scientist       Former Smoker  Alcohol use-no       Drug use-no    Review of Systems      See HPI   Physical Exam  General:     Well-developed,well-nourished,in no acute distress; alert,appropriate and cooperative throughout examination Lungs:     Normal respiratory effort, chest expands symmetrically. Lungs are clear to auscultation, no crackles or wheezes. Heart:     Normal rate and regular rhythm. S1 and S2 normal without gallop, murmur, click, rub or other extra sounds. Abdomen:     Bowel sounds positive,abdomen soft and non-tender without masses, organomegaly or hernias noted. Extremities:     No clubbing, cyanosis, edema, or deformity noted with normal full range of motion of all joints.   Cervical Nodes:     No lymphadenopathy noted Psych:     Cognition and judgment appear intact. Alert and cooperative with normal attention span and concentration. No apparent delusions, illusions, hallucinations    Impression & Recommendations:  Problem # 1:  DEPRESSION (ICD-311)  Discussed. Suspect sx related to Effexor XR. DC and chnage back to plain Effexor. Councelled risk and benefit. If sx persist or worsen, needs immediate recheck. Pt agrees. His updated medication list for this problem includes:    Effexor 37.5 Mg Tabs (Venlafaxine hcl) ..... Half two times a day   Problem # 2:  HYPERTENSION (ICD-401.9) BP at goal. Meds as is. Councelled diet, exersize and low salt intake. Yearly eye exam a must. His updated medication list for this problem includes:    Exforge 10-320 Mg Tabs (Amlodipine besylate-valsartan) ..... One daily  Orders: EKG w/ Interpretation (93000)   Problem # 3:  HYPERLIPIDEMIA (ICD-272.4) Await labs and optomize. TLC a must. His updated medication list for this problem includes:    Pravachol 40 Mg Tabs (Pravastatin sodium) ..... One daily   Problem # 4:  OSTEOARTHRITIS (ICD-715.90) Stable. Meds as is with exersize as tolerated. His updated medication list for this problem includes:    Aspir-low 81 Mg Tbec (Aspirin) ..... Once daily    Advil 200 Mg Caps (Ibuprofen) .Marland Kitchen... 2 every 8 hours as needed   Problem # 5:  SNORING (ICD-786.09) Refer ENT per patient request. Neck circ 15 and 3/4 inch. Consider sleepstudy if ENT does not feel structural abnormality present.  Problem # 6:  DYSPNEA ON EXERTION (ICD-786.09) Multiple risk factors for CAD. No chest pain - just feels he is giving out when he exerts himself. EKG done this morning shows normal findings: . Will refer for Stress test via treadmill. Diff includes deconditioning, cardiac cause, and pulmonary cause as well as even allerguic reaction given pollen counts in this area. Advised cardiac cause needs to be evaluated first. He agrees. Nurse to schedule test. If sx worsen, to ED. Orders: EKG w/ Interpretation (93000)   Problem # 7:  Preventive Health Care (ICD-V70.0) Review and optomize next few visits.  Medications Added to Medication List This Visit:  1)  Effexor 37.5 Mg Tabs (Venlafaxine hcl) .... Half two times a day   Patient Instructions: 1)  Please schedule a follow-up appointment in 6 weeks. Prescriptions: EFFEXOR 37.5 MG TABS (VENLAFAXINE HCL) Half two times a day  #One month x 3   Entered and Authorized by:   Weston Settle MD   Signed by:   Weston Settle MD on 06/03/2007   Method used:   Print then Give to Patient   RxID:   [id number]     EKG  Procedure date:  06/10/2007  Findings:  Normal sinus rhythm with rate of:  64 bpm

## 2011-01-22 NOTE — Progress Notes (Signed)
Summary: FYI-Sleep Study appt  Phone Note From Other Clinic   Summary of Call: Vaughan Basta from [facility] called with a Sleep Study appt for patient for June 3rd. Patient is aware of appt.Marland KitchenMarland KitchenBut she is requesting office notes to be sent.Marland KitchenMarland KitchenI will send everything she needs. Initial call taken by: Durwin Reges,  May 16, 2009 10:11 AM  Follow-up for Phone Call        noted. Follow-up by: Linden Dolin,  May 17, 2009 8:42 AM

## 2011-01-22 NOTE — Progress Notes (Signed)
Summary: effexor script  Phone Note Outgoing Call   Call placed by: Deidre Ala,  June 23, 2007 3:25 PM Call placed to: [facility] Summary of Call: Call was placed tp [employer] about the Effexor script. They were not sending out his script because he had effexor xr filled in May. I explained that he was no longer on the xr, but has been switchto regular effexor. they agreed to send out his new prescription. Initial call taken by: Deidre Ala,  June 23, 2007 3:27 PM

## 2011-01-22 NOTE — Progress Notes (Signed)
Summary: need RX  Phone Note Call from Patient   Reason for Call: Talk to Nurse Summary of Call: patient tried getting [facility] to fax the Rx to our office and they refused. Nexium 68m  Rx# [phone] fax#[phone] phone#[phone] call taken by: ADurwin Reges  October 22, 2007 10:21 AM      Prescriptions: NEXIUM 40 MG CPDR (ESOMEPRAZOLE MAGNESIUM) Once daily  #90 x 1   Entered by:   KDeidre Ala  Authorized by:   CWeston SettleMD   Signed by:   KDeidre Alaon 10/22/2007   Method used:   Print then Give to Patient   RxID::   [id number]

## 2011-01-22 NOTE — Assessment & Plan Note (Signed)
Summary: 4 WEEK FOLLOW UP/ARC   Vital Signs:  Patient Profile:   55 Years Old Male Height:     69.5 inches Weight:      196 pounds BMI:     28.63 O2 Sat:      98 % Temp:     97.6 degrees F Pulse rate:   82 / minute Resp:     14 per minute BP sitting:   131 / 86  Vitals Entered By: Deidre Ala (February 15, 2008 9:57 AM)                 PCP:  Weston Settle, MD  Chief Complaint:  follow up visit.  History of Present Illness: Pt in for recheck.  He has his labs in from work. This was done in the evening and labs were non-fasting. He hd life-insurance physical done and thus labs were done.  1. CMP: GGT 75 - other enzymes normal 2. Lipids:  TC 219, Tig 475, HDL 32, LDL 91 3. HIV - negative 4. UA - negative 5. Cocaine - negative  He has never used Niaspan or Tricor. He states labs look a lot better. States he has not had lipid valuses this good since 15 years ago. He is watching diet and exersize. He notes he had side-effects on some lipid agents. Not sure which ones.  He is curious if he would tolerate the other Rxs. He states he is also wondering with his diet and exersize if he can have reactions to this.He states he wants to be active and he fears medication can impinge this. Cost is also big concern.  He now presents.   Lipid Management History:      Positive NCEP/ATP III risk factors include male age 82 years old or older, HDL cholesterol less than 40, and hypertension.  Negative NCEP/ATP III risk factors include no family history for ischemic heart disease, non-tobacco-user status, no ASHD (atherosclerotic heart disease), no prior stroke/TIA, no peripheral vascular disease, and no history of aortic aneurysm.        The patient states that he knows about the "Therapeutic Lifestyle Change" diet.  His compliance with the TLC diet is good.       Prior Medications Reviewed Using: Patient Recall  Updated Prior Medication List:  EXFORGE 10-320 MG TABS (AMLODIPINE BESYLATE-VALSARTAN) One daily NEXIUM 40 MG CPDR (ESOMEPRAZOLE MAGNESIUM) Once daily PRAVACHOL 40 MG TABS (PRAVASTATIN SODIUM) One daily ASPIR-LOW 81 MG TBEC (ASPIRIN) Once daily ADVIL 200 MG CAPS (IBUPROFEN) 2 every 8 hours as needed VENLAFAXINE HCL 37.5 MG  TABS (VENLAFAXINE HCL) One two times a day ZETIA 10 MG  TABS (EZETIMIBE) One daily  Current Allergies (reviewed today): * STATINS  Past Medical History:    Reviewed history from 12/30/2006 and no changes required:       Depression       GERD       Hyperlipidemia       Hypertension       Osteoarthritis  Past Surgical History:    Reviewed history from 03/06/2007 and no changes required:       Denies surgical history   Family History:    Reviewed history from 03/06/2007 and no changes required:       Father: Dead v76 HTN, Increased lipids       Mother: 38 [place]       Siblings: Sister 61 W&L  Social History:    Reviewed history from 03/06/2007 and no changes required:  Occupation: Careers information officer       Former Smoker       Alcohol use-no       Drug use-no   Risk Factors: Tobacco use:  quit Drug use:  no Alcohol use:  no  Family History Risk Factors:    Family History of MI in females < 68 years old:  no    Family History of MI in males < 73 years old:  no  Colonoscopy History:    Date of Last Colonoscopy:  12/23/2005   Review of Systems      See HPI  General      Denies fatigue and malaise.  Resp      Denies cough, shortness of breath, sputum productive, and wheezing.  GI      Denies abdominal pain, constipation, diarrhea, nausea, and vomiting.  GU      Denies nocturia, urinary frequency, and urinary hesitancy.  Psych      Denies anxiety and depression.   Physical Exam  General:     Well-developed,well-nourished,in no acute distress; alert,appropriate and cooperative throughout examination Lungs:      Normal respiratory effort, chest expands symmetrically. Lungs are clear to auscultation, no crackles or wheezes. Heart:     Normal rate and regular rhythm. S1 and S2 normal without gallop, murmur, click, rub or other extra sounds. Abdomen:     Bowel sounds positive,abdomen soft and non-tender without masses, organomegaly or hernias noted. Extremities:     No clubbing, cyanosis, edema, or deformity noted with normal full range of motion of all joints.   Psych:     Cognition and judgment appear intact. Alert and cooperative with normal attention span and concentration. No apparent delusions, illusions, hallucinations    Impression & Recommendations:  Problem # 1:  HYPERTENSION (ICD-401.9) Stable Rx as is. Councelled TLC. He is cost concerned and I advised him to check with insurance on what they cover in this group. He will do so and touch base with [place]. His updated medication list for this problem includes:    Exforge 10-320 Mg Tabs (Amlodipine besylate-valsartan) ..... One daily   Problem # 2:  HYPERLIPIDEMIA (ICD-272.4) LDL markedly improved. However, HDL lower and Trig still very high. DC Zetia and start Niaspan. Recheck 6 weeks. TLC a must. Discussede risk and beenfit and reviewed cost. Agreeable. He also wants me to update his Cardiologist and we will fax note to Dr. Harrington Challenger for his convenience. His updated medication list for this problem includes:    Pravachol 40 Mg Tabs (Pravastatin sodium) ..... One daily    Niaspan 500 Mg Tbcr (Niacin (antihyperlipidemic)) ..... One at bedtime - pre-dose with baby aspirin 30 minutes prior   Problem # 3:  GERD (ICD-530.81) Nexium expensive. Again, he will call insurance and check on what they will cover. Cont rx as is for now. His updated medication list for this problem includes:    Nexium 40 Mg Cpdr (Esomeprazole magnesium) ..... Once daily   Problem # 4:  Preventaitve Care Needs PSA, rectal on next visit. Agrees.   Complete Medication List: 1)  Exforge 10-320 Mg Tabs (Amlodipine besylate-valsartan) .... One daily 2)  Nexium 40 Mg Cpdr (Esomeprazole magnesium) .... Once daily 3)  Pravachol 40 Mg Tabs (Pravastatin sodium) .... One daily 4)  Aspir-low 81 Mg Tbec (Aspirin) .... Once daily 5)  Advil 200 Mg Caps (Ibuprofen) .... 2 every 8 hours  as needed 6)  Venlafaxine Hcl 37.5 Mg Tabs (Venlafaxine hcl) .... One two times a day 7)  Niaspan 500 Mg Tbcr (Niacin (antihyperlipidemic)) .... One at bedtime - pre-dose with baby aspirin 30 minutes prior  Lipid Assessment/Plan:      Based on NCEP/ATP III, the patient's risk factor category is "2 or more risk factors and a calculated 10 year CAD risk of < 20%".  From this information, the patient's calculated lipid goals are as follows: Total cholesterol goal is 200; LDL cholesterol goal is 130; HDL cholesterol goal is 40; Triglyceride goal is 150.     Patient Instructions: 1)  Please schedule a follow-up appointment in 6 weeks.    Prescriptions: NIASPAN 500 MG  TBCR (NIACIN (ANTIHYPERLIPIDEMIC)) One at bedtime - pre-dose with baby Aspirin 30 minutes prior  #30 x 3   Entered and Authorized by:   Weston Settle MD   Signed by:   Weston Settle MD on 02/15/2008   Method used:   Print then Give to Patient   RxID:   [id number]  ]  Preventive Care Screening  Colonoscopy:    Next Due:  12/2008  Appended Document: 4 WEEK FOLLOW UP/ARC this office note has been faxed to Dr Ross-Cardiology...[phone].Marland KitchenMarland Kitchenarc

## 2011-01-22 NOTE — Assessment & Plan Note (Signed)
Summary: F/U   Vital Signs:  Patient Profile:   55 Years Old Male Height:     69.5 inches Weight:      192 pounds BMI:     28.05 O2 Sat:      98 % Temp:     97.7 degrees F Pulse rate:   87 / minute Resp:     14 per minute BP sitting:   158 / 96  Pt. in pain?   no                Visit Type:  Recheck PCP:  Weston Settle, MD  Chief Complaint:  elevated BP's.  History of Present Illness: Pt states he is still having a lot of knee pain.  He was given a cortisone shot in his knee - right side. He states it helped but now it is starting to wear off. States pain can be as bad as 10/10. Worse with activity. Notes today it is not as bad. Left knee is 1 at present and right is 3 or 4. States he feels he gets swelling behind his right. He does not get locking but has popping sensation. He notes he can walk along and it feels as if his right knee is being hyperextended. Told Dr. Adron Bene this and was told it could be normal. Also told he was getting older - did not like to hear this.  Curious if he needs MRI as he knows something is going on. He notes he has been on his Naproxen and it just does not work - thinks [employer] better.  He notes he is disappointed as he is spending lots of money on this - and he is not seeing results.  Hypertension History:      He complains of headache, but denies chest pain, palpitations, dyspnea with exertion, orthopnea, PND, peripheral edema, visual symptoms, neurologic problems, syncope, and side effects from treatment.  He notes no problems with any antihypertensive medication side effects.  Further comments include: Pt on Ramipril. States he was switched as insurance would not cover Altace. He gets headaches when his BP goes up. Wants something done. He tries to exersize by riding his bike. Marland Kitchen         Positive major cardiovascular risk factors include male age 40 years old or older, hyperlipidemia, and hypertension.  Negative major cardiovascular risk factors include negative family history for ischemic heart disease and non-tobacco-user status.        Further assessment for target organ damage reveals no history of ASHD, stroke/TIA, or peripheral vascular disease.    Lipid Management History:      Positive NCEP/ATP III risk factors include male age 53 years old or older, HDL cholesterol less than 40, and hypertension.  Negative NCEP/ATP III risk factors include no family history for ischemic heart disease, non-tobacco-user status, no ASHD (atherosclerotic heart disease), no prior stroke/TIA, no peripheral vascular disease, and no history of aortic aneurysm.        The patient states that he knows about the "Therapeutic Lifestyle Change" diet.  His compliance with the TLC diet is good.  The patient expresses understanding of adjunctive measures for cholesterol lowering.  Adjunctive measures started by the patient include aerobic exercise, fiber, ASA, and omega-3 supplements.  Comments: Started Pravachol - off Zetia. No side-effects on it. Started January 08/2007. Saw adds on Zetia and states he just did not want it anymore. Has tried other statins i.e. Lipitor, Zocor and they made him feel  miserable and gave him diarrhea.    Prior Medications: Current Allergies: * STATINS  Past Surgical History:    Denies surgical history   Family History:    Father: Dead v74 HTN, Increased lipids    Mother: 52 [place]    Siblings: Sister 31 W&L  Social History:    Occupation: Mining engineer    Former Smoker    Alcohol use-no    Drug use-no   Risk Factors:  Tobacco use:  quit Drug use:  no Alcohol use:  no  Family History Risk Factors:    Family History of MI in females < 84 years old:  no    Family History of MI in males < 31 years old:  no   Colonoscopy History:     Date of Last Colonoscopy:  12/23/2005    Results:  Colonic Polyps and Chrons    Review of Systems      See HPI   Physical Exam  General:     Well-developed,well-nourished,in no acute distress; alert,appropriate and cooperative throughout examination Lungs:     Normal respiratory effort, chest expands symmetrically. Lungs are clear to auscultation, no crackles or wheezes. Heart:     Normal rate and regular rhythm. S1 and S2 normal without gallop, murmur, click, rub or other extra sounds. Abdomen:     Bowel sounds positive,abdomen soft and non-tender without masses, organomegaly or hernias noted. Extremities:     No clubbing, cyanosis, edema, or deformity noted with normal full range of motion of all joints.      Impression & Recommendations:  Problem # 1:  HYPERTENSION (ICD-401.9) Discussed. BP not at goal. Pt losing faith in meds. Swith to [employer]. Diet, exersize and low salt intake a must. recheck 6 weeks. His updated medication list for this problem includes:    Exforge 10-160 Mg Tabs (Amlodipine besylate-valsartan) ..... One by mouth daily   Problem # 2:  OSTEOARTHRITIS (ICD-715.90) MRI right knee given sx of hyperextension and feeling as if it is going to give out. Await result and optomize. His updated medication list for this problem includes:    Aspir-low 81 Mg Tbec (Aspirin) ..... Once daily    Advil 200 Mg Caps (Ibuprofen) .Marland Kitchen... 2 every 8 hours as needed   Problem # 3:  HYPERLIPIDEMIA (ICD-272.4) Severe. Tolerating Pravchol. TLC a must. Recheck and optomize per ATP III. Consider adding Tricor or Lovasa. His updated medication list for this problem includes:    Pravachol 40 Mg Tabs (Pravastatin sodium) ..... One daily  Orders: T-Comprehensive Metabolic Panel ([id number]) T-Lipid Profile ([id number])   Problem # 4:  DEPRESSION (ICD-311) Stable. Meds as is. His updated medication list for this problem includes:     Effexor Xr 75 Mg Cp24 (Venlafaxine hcl) ..... Once daily   Medications Added to Medication List This Visit: 1)  Effexor Xr 75 Mg Cp24 (Venlafaxine hcl) .... Once daily 2)  Exforge 10-160 Mg Tabs (Amlodipine besylate-valsartan) .... One by mouth daily 3)  Nexium 40 Mg Cpdr (Esomeprazole magnesium) .... Once daily 4)  Pravachol 40 Mg Tabs (Pravastatin sodium) .... One daily 5)  Aspir-low 81 Mg Tbec (Aspirin) .... Once daily 6)  Advil 200 Mg Caps (Ibuprofen) .... 2 every 8 hours as needed  Hypertension Assessment/Plan:      The patient's hypertensive risk group is category B: At least one risk factor (excluding diabetes) with no target organ damage.  His calculated 10 year risk of coronary  heart disease is 22 %.  Today's blood pressure is 158/96.  His blood pressure goal is < 140/90.  Lipid Assessment/Plan:      Based on NCEP/ATP III, the patient's risk factor category is "2 or more risk factors and a calculated 10 year CAD risk of > 20%".  From this information, the patient's calculated lipid goals are as follows: Total cholesterol goal is 200; LDL cholesterol goal is 100; HDL cholesterol goal is 40; Triglyceride goal is 150.     Patient Instructions: 1)  Please schedule a follow-up appointment in 6 weeks.  Appended Document: Orders Update    Clinical Lists Changes  Problems: Added new problem of KNEE JOINT INSTABILITY (TVG-102.54) Orders: Added new Test order of T-MRI Lower extremity joint w/o contrast (86282) - Signed     Appended Document: F/U mri appt 03/16/07 at 9:30

## 2011-01-22 NOTE — Assessment & Plan Note (Signed)
Summary: NEEDS REFILLS/SLJ   Vital Signs:  Patient Profile:   55 Years Old Male Height:     69.5 inches Weight:      192 pounds BMI:     28.05 O2 Sat:      99 % Pulse rate:   73 / minute Resp:     12 per minute BP sitting:   129 / 82  Vitals Entered By: Deidre Ala (May 20, 2008 9:07 AM)                 PCP:  Weston Settle, MD  Chief Complaint:  needs refills.  History of Present Illness: Pt in for recheck.  He notes he is doing well.  He states he needs refills on Nexium. He is rquesting 3 month supply for this as he uses [facility]. He states this helps his GERD a lot. He has a good apetite. He denies nausea and vomitting and notes no diarrhea or constipation. He needs repeat colonoscopy in 2010.   He is on Niaspan. Has tolerated well and states no sweats or flushing. He is watching diet, riding bike. He does try to use aspirin prior to use and states had single spell only where he felt like burning up.   He now presents.    Hypertension History:      He denies headache, chest pain, palpitations, dyspnea with exertion, orthopnea, PND, peripheral edema, visual symptoms, neurologic problems, syncope, and side effects from treatment.  He notes no problems with any antihypertensive medication side effects.  Further comments include: Tolerating medications. Rides bike 50 miles a week. Loves it. Marland Kitchen        Positive major cardiovascular risk factors include male age 69 years old or older, hyperlipidemia, and hypertension.  Negative major cardiovascular risk factors include negative family history for ischemic heart disease and non-tobacco-user status.        Further assessment for target organ damage reveals no history of ASHD, stroke/TIA, or peripheral vascular disease.    Lipid Management History:       Positive NCEP/ATP III risk factors include male age 23 years old or older, HDL cholesterol less than 40, and hypertension.  Negative NCEP/ATP III risk factors include no family history for ischemic heart disease, non-tobacco-user status, no ASHD (atherosclerotic heart disease), no prior stroke/TIA, no peripheral vascular disease, and no history of aortic aneurysm.        The patient states that he knows about the "Therapeutic Lifestyle Change" diet.  His compliance with the TLC diet is good.  The patient expresses understanding of adjunctive measures for cholesterol lowering.  Adjunctive measures started by the patient include aerobic exercise, fiber, ASA, and omega-3 supplements.  He expresses no side effects from his lipid-lowering medication.  The patient denies any symptoms to suggest myopathy or liver disease from his "statin" therapy.        Prior Medications Reviewed Using: Patient Recall  Updated Prior Medication List: EXFORGE 10-320 MG TABS (AMLODIPINE BESYLATE-VALSARTAN) One daily NEXIUM 40 MG CPDR (ESOMEPRAZOLE MAGNESIUM) Once daily PRAVACHOL 40 MG TABS (PRAVASTATIN SODIUM) One daily ASPIR-LOW 81 MG TBEC (ASPIRIN) Once daily ADVIL 200 MG CAPS (IBUPROFEN) 2 every 8 hours as needed VENLAFAXINE HCL 37.5 MG  TABS (VENLAFAXINE HCL) One two times a day NIASPAN 500 MG  TBCR (NIACIN (ANTIHYPERLIPIDEMIC)) One at bedtime - pre-dose with baby Aspirin 30 minutes prior  Current Allergies (reviewed today): * STATINS  Past Medical History:    Reviewed history from 12/30/2006 and no changes required:  Depression       GERD       Hyperlipidemia       Hypertension       Osteoarthritis  Past Surgical History:    Reviewed history from 03/06/2007 and no changes required:       Denies surgical history   Family History:    Reviewed history from 03/06/2007 and no changes required:       Father: Dead v76 HTN, Increased lipids       Mother: 29 [place]       Siblings: Sister 14 W&L   Social History:    Reviewed history from 03/06/2007 and no changes required:       Occupation: Web designer       Divorced       Soil scientist       Former Smoker       Alcohol use-no       Drug use-no    Review of Systems      See HPI  General      Denies chills, fever, and sweats.  Resp      Denies cough, shortness of breath, sputum productive, and wheezing.  GI      Denies abdominal pain, constipation, diarrhea, nausea, and vomiting.  GU      Denies nocturia, urinary frequency, and urinary hesitancy.  Psych      Denies anxiety and depression.      Doing well emotionally with venlaxafine.   Physical Exam  General:     Well-developed,well-nourished,in no acute distress; alert,appropriate and cooperative throughout examination Lungs:     Normal respiratory effort, chest expands symmetrically. Lungs are clear to auscultation, no crackles or wheezes. Heart:     Normal rate and regular rhythm. S1 and S2 normal without gallop, murmur, click, rub or other extra sounds. Abdomen:     Bowel sounds positive,abdomen soft and non-tender without masses, organomegaly or hernias noted. Extremities:     No clubbing, cyanosis, edema, or deformity noted with normal full range of motion of all joints.   Psych:     Cognition and judgment appear intact. Alert and cooperative with normal attention span and concentration. No apparent delusions, illusions, hallucinations    Impression & Recommendations:  Problem # 1:  HYPERTENSION (ICD-401.9) Discussed. Stable. Rx as is. TLC a must. Advised eye care and dental care. Check renal function and lytes.  His updated medication list for this problem includes:    Exforge 10-320 Mg Tabs (Amlodipine besylate-valsartan) ..... One daily  Orders: UA Dipstick w/o Micro (manual) (60630) T-Comprehensive Metabolic Panel (16010-93235) T-CBC w/Diff ([id number])   Problem # 2:  HYPERLIPIDEMIA (ICD-272.4)  Repeat labs to assure ATP III goals met. Diet, exersize and high fiber intake a must. Check Lipids and LFTs. His updated medication list for this problem includes:    Pravachol 40 Mg Tabs (Pravastatin sodium) ..... One daily    Niaspan 500 Mg Tbcr (Niacin (antihyperlipidemic)) ..... One at bedtime - pre-dose with baby aspirin 30 minutes prior  Orders: T-Comprehensive Metabolic Panel ([id number]) T-Lipid Profile (15176-16073) T-TSH (71062-69485)   Problem # 3:  GERD (ICD-530.81) Stable. Rx as is. TLC a must. Refill med. His updated medication list for this problem includes:    Nexium 40 Mg Cpdr (Esomeprazole magnesium) ..... Once daily   Problem # 4:  DEPRESSION (ICD-311) Stable. Rx as is. Councelled stresscoping skills. His updated medication list for this problem includes:    Venlafaxine Hcl 37.5 Mg Tabs (Venlafaxine hcl) .Marland KitchenMarland KitchenMarland KitchenMarland Kitchen  One two times a day   Problem # 5:  Preventive Health Care (ICD-V70.0) PSA drawn. Rectal next visit. Up to date for colonoscopy.  Complete Medication List: 1)  Exforge 10-320 Mg Tabs (Amlodipine besylate-valsartan) .... One daily 2)  Nexium 40 Mg Cpdr (Esomeprazole magnesium) .... Once daily 3)  Pravachol 40 Mg Tabs (Pravastatin sodium) .... One daily 4)  Aspir-low 81 Mg Tbec (Aspirin) .... Once daily 5)  Advil 200 Mg Caps (Ibuprofen) .... 2 every 8 hours as needed 6)  Venlafaxine Hcl 37.5 Mg Tabs (Venlafaxine hcl) .... One two times a day 7)  Niaspan 500 Mg Tbcr (Niacin (antihyperlipidemic)) .... One at bedtime - pre-dose with baby aspirin 30 minutes prior  Other Orders: T-PSA  ([id number])  Hypertension Assessment/Plan:      The patient's hypertensive risk group is category B: At least one risk factor (excluding diabetes) with no target organ damage.  His calculated 10 year risk of coronary heart disease is 9 %.  Today's blood pressure is 129/82.  His blood pressure goal is < 140/90.  Lipid Assessment/Plan:       Based on NCEP/ATP III, the patient's risk factor category is "2 or more risk factors and a calculated 10 year CAD risk of < 20%".  From this information, the patient's calculated lipid goals are as follows: Total cholesterol goal is 200; LDL cholesterol goal is 130; HDL cholesterol goal is 40; Triglyceride goal is 150.     Patient Instructions: 1)  Please schedule a follow-up appointment in 3 months.   Prescriptions: PRAVACHOL 40 MG TABS (PRAVASTATIN SODIUM) One daily  #90 x 1   Entered and Authorized by:   Weston Settle MD   Signed by:   Weston Settle MD on 05/20/2008   Method used:   Print then Give to Patient   RxID:   [id number] VENLAFAXINE HCL 37.5 MG  TABS (VENLAFAXINE HCL) One two times a day  #180 x 1   Entered and Authorized by:   Weston Settle MD   Signed by:   Weston Settle MD on 05/20/2008   Method used:   Print then Give to Patient   RxID:   [id number] NIASPAN 500 MG  TBCR (NIACIN (ANTIHYPERLIPIDEMIC)) One at bedtime - pre-dose with baby Aspirin 30 minutes prior  #90 x 1   Entered and Authorized by:   Weston Settle MD   Signed by:   Weston Settle MD on 05/20/2008   Method used:   Print then Give to Patient   RxID:   [id number] [place] 40 MG CPDR (ESOMEPRAZOLE MAGNESIUM) Once daily  #90 x 1   Entered and Authorized by:   Weston Settle MD   Signed by:   Weston Settle MD on 05/20/2008   Method used:   Print then Give to Patient   RxID:   [id number] EXFORGE 10-320 MG TABS (AMLODIPINE BESYLATE-VALSARTAN) One daily  #90 x 1   Entered and Authorized by:   Weston Settle MD   Signed by:   Weston Settle MD on 05/20/2008   Method used:   Print then Give to Patient   RxID:   [id number]  ]  Appended Document: NEEDS REFILLS/SLJ    Clinical Lists Changes  Observations: Added new observation of APPEARANCE U: Clear (05/20/2008 10:09)  Added new observation of BILIRUBIN UR: negative (05/20/2008 10:09) Added new observation of SPEC GR URIN: 1.015  (05/20/2008 10:09) Added new observation of [place] URINE: 7.5  (05/20/2008 10:09) Added new observation of UROBILINOGEN: 1.0  (05/20/2008 10:09)  Added new observation of WBC DIPSTK U: negative  (05/20/2008 10:09) Added new observation of NITRITE URN: negative  (05/20/2008 10:09) Added new observation of PROTEIN, URN: 30  (05/20/2008 10:09) Added new observation of BLOOD UR DIP: negative  (05/20/2008 10:09) Added new observation of KETONES URN: trace (5)  (05/20/2008 10:09) Added new observation of GLUCOSE, URN: negative  (05/20/2008 10:09) Added new observation of UA COLOR: yellow  (05/20/2008 10:09)       Laboratory Results   Urine Tests    Routine Urinalysis   Color: yellow Appearance: Clear Glucose: negative   (Normal Range: Negative) Bilirubin: negative   (Normal Range: Negative) Ketone: trace (5)   (Normal Range: Negative) Spec. Gravity: 1.015   (Normal Range: 1.003-1.035) Blood: negative   (Normal Range: Negative) pH: 7.5   (Normal Range: 5.0-8.0) Protein: 30   (Normal Range: Negative) Urobilinogen: 1.0   (Normal Range: 0-1) Nitrite: negative   (Normal Range: Negative) Leukocyte Esterace: negative   (Normal Range: Negative)

## 2011-01-22 NOTE — Progress Notes (Signed)
Summary: need Rx  Phone Note Call from Patient   Reason for Call: Talk to Nurse Summary of Call: patient is requesting a Rx to sent to [facility] [place] for his bp medication... Initial call taken by: Durwin Reges,  July 28, 2007 1:51 PM  Follow-up for Phone Call        [facility]. Follow-up by: Deidre Ala,  July 28, 2007 2:43 PM         Appended Document: need Rx Pt called at 11:20am and states " I called yesterday to get a refill on my BP meds. and I went by the pharmacy and they said they had not recieved anything from our office".Requests someone" call him back and let him know whats going on".[phone]  Appended Document: need Rx Checked pts chart and saw where his BP med had been called in yesterday to [facility]. and informed him  and he states "he will call them".

## 2011-01-22 NOTE — Letter (Signed)
Summary: Radiology  Radiology   Imported By: Otila Kluver, LPN 87/57/9728 20:60:15  _____________________________________________________________________  External Attachment:    Type:   Image     Comment:   External Document

## 2011-01-22 NOTE — Letter (Signed)
Summary: New Patient Screening  New Patient Screening   Imported By: Otila Kluver, LPN 10/12/1172 56:70:14  _____________________________________________________________________  External Attachment:    Type:   Image     Comment:   External Document

## 2011-01-22 NOTE — Letter (Signed)
Summary: sleep study  sleep study   Imported By: Eliezer Mccoy 06/30/2009 08:47:48  _____________________________________________________________________  External Attachment:    Type:   Image     Comment:   External Document

## 2011-01-22 NOTE — Letter (Signed)
Summary: feeling better letter  feeling better letter   Imported By: Eliezer Mccoy 10/21/2008 10:39:02  _____________________________________________________________________  External Attachment:    Type:   Image     Comment:   External Document

## 2011-01-22 NOTE — Letter (Signed)
Summary: effexor refill  effexor refill   Imported By: Charna Busman 06/22/2007 14:43:30  _____________________________________________________________________  External Attachment:    Type:   Image     Comment:   External Document

## 2011-01-22 NOTE — Letter (Signed)
Summary: Records Release  Records Release   Imported By: Otila Kluver, LPN 56/72/0919 80:22:17  _____________________________________________________________________  External Attachment:    Type:   Image     Comment:   External Document

## 2011-01-22 NOTE — Miscellaneous (Signed)
Summary: Orders Update  Clinical Lists Changes  Orders: Added new Referral order of Cardiology Referral (Cardiology) - Signed   Appended Document: Orders Update appoint set with [place] on 06/08/07 at 1030am, message left for pt to call

## 2011-01-22 NOTE — Letter (Signed)
Summary: FlowSheet  FlowSheet   Imported By: Otila Kluver, LPN 23/70/2301 72:09:10  _____________________________________________________________________  External Attachment:    Type:   Image     Comment:   External Document

## 2011-01-22 NOTE — Medication Information (Signed)
Summary: PRAVACHOL REFILL  PRAVACHOL REFILL   Imported By: Oleta Mouse 05/12/2007 13:14:49  _____________________________________________________________________  External Attachment:    Type:   Image     Comment:   External Document

## 2011-01-22 NOTE — Letter (Signed)
Summary: [facility] approval for sleep study  [facility] approval for sleep study   Imported By: Eliezer Mccoy 05/24/2009 15:23:52  _____________________________________________________________________  External Attachment:    Type:   Image     Comment:   External Document

## 2011-01-22 NOTE — Letter (Signed)
Summary: Correspondence  Correspondence   Imported By: Otila Kluver, LPN 68/37/2902 11:15:52  _____________________________________________________________________  External Attachment:    Type:   Image     Comment:   External Document

## 2011-01-22 NOTE — Medication Information (Signed)
Summary: Exforge 10-310m  Exforge 10-3268m  Imported By: AmDurwin Reges2/11/2007 12:03:05  _____________________________________________________________________  External Attachment:    Type:   Image     Comment:   External Document

## 2011-01-22 NOTE — Assessment & Plan Note (Signed)
Summary: follow up/arc   Vital Signs:  Patient profile:   55 year old male Height:      69.5 inches Weight:      185 pounds BMI:     27.03 O2 Sat:      99 % Pulse rate:   74 / minute Resp:     12 per minute BP sitting:   134 / 81  Vitals Entered By: Deidre Ala LPN (March 21, 1223 8:25 AM) CC: Recheck, Hypertension Management, Lipid Management Is Patient Diabetic? No   Primary Provider:  Weston Settle, MD  CC:  Recheck.  History of Present Illness: Pt in for recheck.  He states he is doing well. He has a hx of right shoulder pain. States saw Dr. Aline Brochure for this and had cortisone and gave home exersizes. He states pain on right has resolved. Happy with this. However slipped on ice in February and landed on left. States  saw chiropracter immediately and had some ROM strecthes and exersize and this has helped. States getting better and notes may consider seeing Dr. Aline Brochure if persists.  He had a lot of myalgia on his statin and this was stopped. He notes the myalgias cleared and he adds he stopped Niacin as well. He notes he is watching diet and he cont toi watch diet. He started Fish oil and is now on 1000 mg daily. He adds he bought a book called "racing weight" and states about weight management for athletes. He states he eats fresh fruit now and statets loves blueberries. He stopped eating "empty calories" and feels good. He is agreeable with lab recheck.  He now presents.  Hypertension History:      He denies headache, chest pain, palpitations, dyspnea with exertion, orthopnea, PND, peripheral edema, visual symptoms, neurologic problems, syncope, and side effects from treatment.  He notes no problems with any antihypertensive medication side effects.  Further comments include: Cont on Rx. Watching diet and exersize. He is limiting salt and states tries to avoid for most part.         Positive major cardiovascular risk factors include male age 68 years old or older, hyperlipidemia, and hypertension.  Negative major cardiovascular risk factors include negative family history for ischemic heart disease and non-tobacco-user status.        Further assessment for target organ damage reveals no history of ASHD, stroke/TIA, or peripheral vascular disease.    Lipid Management History:      Positive NCEP/ATP III risk factors include male age 48 years old or older and hypertension.  Negative NCEP/ATP III risk factors include no family history for ischemic heart disease, non-tobacco-user status, no ASHD (atherosclerotic heart disease), no prior stroke/TIA, no peripheral vascular disease, and no history of aortic aneurysm.        The patient states that he does not know about the "Therapeutic Lifestyle Change" diet.  His compliance with the TLC diet is good.  The patient expresses understanding of adjunctive measures for cholesterol lowering.     Preventive Screening-Counseling & Management     Alcohol drinks/day: 1     Alcohol type: wine - red/white glass 5 nights a week.     Smoking Status: never  Current Problems (verified): 1)  Impingement Syndrome  (ICD-726.2) 2)  Shoulder Pain  (ICD-719.41) 3)  Knee Joint Instability  (ICD-718.86) 4)  Osteoarthritis  (ICD-715.90) 5)  Hypertension  (ICD-401.9) 6)  Hyperlipidemia  (ICD-272.4) 7)  Gerd  (ICD-530.81) 8)  Depression  (ICD-311)  Current Medications (  verified): 1)  Exforge 10-320 Mg Tabs (Amlodipine Besylate-Valsartan) .... One Daily 2)  Nexium 40 Mg Cpdr (Esomeprazole Magnesium) .... Once Daily 3)  Aspir-Low 81 Mg Tbec (Aspirin) .... Once Daily 4)  Advil 200 Mg Caps (Ibuprofen) .... 2 Every 8 Hours As Needed 5)  Venlafaxine Hcl 37.5 Mg  Tabs (Venlafaxine Hcl) .... One Two Times A Day  Allergies (verified): 1)  * Statins  Family History:    Reviewed history from 03/06/2007 and no changes required:        Father: Dead 76 HTN, Increased lipids and melanoma was cause of death       Mother: 66 [place],        Siblings: Sister 80 W&L  Social History:    Reviewed history from 03/06/2007 and no changes required:       Occupation: Web designer       Divorced x 2 and remarried - 2009       Former Smoker       Alcohol use-no       Drug use-no    Smoking Status:  never  Review of Systems      See HPI General:  Denies chills, fever, and sweats. Resp:  Denies cough, shortness of breath, sputum productive, and wheezing. GI:  Denies abdominal pain, constipation, diarrhea, nausea, and vomiting. GU:  Denies nocturia, urinary frequency, and urinary hesitancy. Psych:  Denies anxiety and depression.  Physical Exam  General:  Well-developed,well-nourished,in no acute distress; alert,appropriate and cooperative throughout examination Lungs:  Normal respiratory effort, chest expands symmetrically. Lungs are clear to auscultation, no crackles or wheezes. Heart:  Normal rate and regular rhythm. S1 and S2 normal without gallop, murmur, click, rub or other extra sounds. Abdomen:  Bowel sounds positive,abdomen soft and non-tender without masses, organomegaly or hernias noted. Extremities:  No clubbing, cyanosis, edema, or deformity noted with normal full range of motion of all joints.   Cervical Nodes:  No lymphadenopathy noted Psych:  Cognition and judgment appear intact. Alert and cooperative with normal attention span and concentration. No apparent delusions, illusions, hallucinations   Impression & Recommendations:  Problem # 1:  HYPERLIPIDEMIA (ICD-272.4) Repeat labs and optomize. TLC diet advised. Cont OTC fish oil for now. Orders: T-Comprehensive Metabolic Panel ([id number]) T-Lipid Profile [phone])    Problem # 2:  HYPERTENSION (ICD-401.9) Rx as below. TLC diet advised with low salt diet. Check renal function and lytes. His updated medication list for this problem includes:     Exforge 10-320 Mg Tabs (Amlodipine besylate-valsartan) ..... One daily  Orders: T-Comprehensive Metabolic Panel ([id number])  Problem # 3:  GERD (ICD-530.81) Rx as below. TLC diet advised.  His updated medication list for this problem includes:    Nexium 40 Mg Cpdr (Esomeprazole magnesium) ..... Once daily  Problem # 4:  SHOULDER PAIN (ICD-719.41) Resolved on right but now fall and some left shoulder pain. Cont Rx as per Chiropracter and see Dr. Aline Brochure if needed. His updated medication list for this problem includes:    Aspir-low 81 Mg Tbec (Aspirin) ..... Once daily    Advil 200 Mg Caps (Ibuprofen) .Marland Kitchen... 2 every 8 hours as needed  Problem # 5:  DEPRESSION (ICD-311) Stable. Rx as below. New scripts given. His updated medication list for this problem includes:    Venlafaxine Hcl 37.5 Mg Tabs (Venlafaxine hcl) ..... One two times a day  Problem # 6:  CROHN'S DISEASE, HX OF (ICD-V12.70) Well male exam in June/JUly with GI eval, prostate check etc.  Complete Medication List: 1)  Exforge 10-320 Mg Tabs (Amlodipine besylate-valsartan) .... One daily 2)  Nexium 40 Mg Cpdr (Esomeprazole magnesium) .... Once daily 3)  Aspir-low 81 Mg Tbec (Aspirin) .... Once daily 4)  Advil 200 Mg Caps (Ibuprofen) .... 2 every 8 hours as needed 5)  Venlafaxine Hcl 37.5 Mg Tabs (Venlafaxine hcl) .... One two times a day  Hypertension Assessment/Plan:      The patient's hypertensive risk group is category B: At least one risk factor (excluding diabetes) with no target organ damage.  His calculated 10 year risk of coronary heart disease is 11 %.  Today's blood pressure is 134/81.  His blood pressure goal is < 140/90.  Lipid Assessment/Plan:       Based on NCEP/ATP III, the patient's risk factor category is "2 or more risk factors and a calculated 10 year CAD risk of < 20%".  From this information, the patient's calculated lipid goals are as follows: Total cholesterol goal is 200; LDL cholesterol goal is 130; HDL cholesterol goal is 40; Triglyceride goal is 150.     Patient Instructions: 1)  Please schedule a follow-up appointment in 3 months. Prescriptions: VENLAFAXINE HCL 37.5 MG  TABS (VENLAFAXINE HCL) One two times a day  #180 x 1   Entered and Authorized by:   Weston Settle MD   Signed by:   Weston Settle MD on 03/20/2009   Method used:   Print then Give to Patient   RxID:   [id number] [place] 40 MG CPDR (ESOMEPRAZOLE MAGNESIUM) Once daily  #90 x 1   Entered and Authorized by:   Weston Settle MD   Signed by:   Weston Settle MD on 03/20/2009   Method used:   Print then Give to Patient   RxID:   [id number] EXFORGE 10-320 MG TABS (AMLODIPINE BESYLATE-VALSARTAN) One daily  #90 x 1   Entered and Authorized by:   Weston Settle MD   Signed by:   Weston Settle MD on 03/20/2009   Method used:   Print then Give to Patient   RxID:   [id number]

## 2011-01-22 NOTE — Letter (Signed)
Summary: REC FROM [facility]  REC FROM [facility]   Imported By: Hoy Morn 11/02/2010 11:24:51  _____________________________________________________________________  External Attachment:    Type:   Image     Comment:   External Document

## 2011-01-22 NOTE — Assessment & Plan Note (Signed)
Summary: follow up on sleep study/arc   Vital Signs:  Patient profile:   55 year old male Height:      69.5 inches Weight:      185 pounds O2 Sat:      98 % Temp:     97.3 degrees F oral Pulse rate:   60 / minute Resp:     16 per minute BP sitting:   128 / 84  (left arm) Cuff size:   regular  Vitals Entered By: Georgina Peer (July 04, 2009 9:03 AM) CC: discuss sleep study, Lipid Management   Primary Provider:  Weston Settle, MD  CC:  discuss sleep study and Lipid Management.  History of Present Illness: Pt in for recheck.   He notes he is doing well.  He was having fatigue with bike riding and this has reslved since changing BP med taking time to noon. He is also hydrating better and states doing so much better. He denies chest pain, palpitations, leg swelling and orthopnea/ PND. He denies history of dyshytmia. States no fam hx of same.   He had a sleep study related to excessive snoing and this is reviewed  1. Very mild apnea not meeting split night protocol. Position change advised as most symptoms supine. 2.  Bout of SVT with HR of 140s.   He now presents.   Lipid Management History:      Positive NCEP/ATP III risk factors include male age 47 years old or older, HDL cholesterol less than 40, and hypertension.  Negative NCEP/ATP III risk factors include no family history for ischemic heart disease, non-tobacco-user status, no ASHD (atherosclerotic heart disease), no prior stroke/TIA, no peripheral vascular disease, and no history of aortic aneurysm.         The patient states that he knows about the "Therapeutic Lifestyle Change" diet.  His compliance with the TLC diet is good.  Adjunctive measures started by the patient include aerobic exercise, fiber, and ASA.  He expresses no side effects from his lipid-lowering medication.  The patient denies any symptoms to suggest myopathy or liver disease.  Comments: Has not started crestor. Working on diet and exersize.   States may do later if TLC does not help reduce labs.  Current Problems (verified): 1)  Sleep Apnea  (ICD-780.57) 2)  Malaise and Fatigue  (ICD-780.79) 3)  Skin Rash  (ICD-782.1) 4)  Crohn's Disease, Hx of  (ICD-V12.70) 5)  Impingement Syndrome  (ICD-726.2) 6)  Shoulder Pain  (ICD-719.41) 7)  Knee Joint Instability  (ICD-718.86) 8)  Osteoarthritis  (ICD-715.90) 9)  Hypertension  (ICD-401.9) 10)  Hyperlipidemia  (ICD-272.4) 11)  Gerd  (ICD-530.81) 12)  Depression  (ICD-311)  Current Medications (verified): 1)  Exforge 10-320 Mg Tabs (Amlodipine Besylate-Valsartan) .... One Daily 2)  Nexium 40 Mg Cpdr (Esomeprazole Magnesium) .... Once Daily 3)  Aspir-Low 81 Mg Tbec (Aspirin) .... Once Daily 4)  Advil 200 Mg Caps (Ibuprofen) .... 2 Every 8 Hours As Needed 5)  Venlafaxine Hcl 37.5 Mg  Tabs (Venlafaxine Hcl) .... One Two Times A Day 6)  Fish Oil 1200 Mg Caps (Omega-3 Fatty Acids) .... Two Times A Day 7)  Triamcinolone Acetonide 0.025 % Crea (Triamcinolone Acetonide) .... Apply To Right Wrist Two Times A Day For 2 Weeks. 8)  Crestor 10 Mg Tabs (Rosuvastatin Calcium) .... One Daily At Bedtime  Allergies (verified): 1)  * Statins  Review of Systems      See HPI General:  Denies chills, fever, and sweats. Resp:  Denies cough, shortness of breath, sputum productive, and wheezing. GI:  Denies abdominal pain, constipation, diarrhea, nausea, and vomiting. GU:  Denies nocturia, urinary frequency, and urinary hesitancy. Psych:  Denies anxiety and depression.  Physical Exam   General:  Well-developed,well-nourished,in no acute distress; alert,appropriate and cooperative throughout examination Lungs:  Normal respiratory effort, chest expands symmetrically. Lungs are clear to auscultation, no crackles or wheezes. Heart:  Normal rate and regular rhythm. S1 and S2 normal without gallop, murmur, click, rub or other extra sounds. Abdomen:  Bowel sounds positive,abdomen soft and non-tender without masses, organomegaly or hernias noted. Extremities:  No clubbing, cyanosis, edema, or deformity noted with normal full range of motion of all joints.   Cervical Nodes:  No lymphadenopathy noted Psych:  Cognition and judgment appear intact. Alert and cooperative with normal attention span and concentration. No apparent delusions, illusions, hallucinations   Impression & Recommendations:  Problem # 1:  SLEEP APNEA (ICD-780.57) Discussed. Discussed limiting sleeping supine and edcuated wedge, pillow etc. Does not need formal Rx and weight loss unlikley to benefit. Councelled side sleeping. Will aslo limit snoring and educated how this will aide in better sleep.   Problem # 2:  ? of SUPRAVENTRICULAR TACHYCARDIA (ICD-427.89) Asyptomatic but note prior malsie and fatigue when riding bike. EKG done and unremarkable except for bradycardia with HR of 48. Rides bike extensively and likely secondary to being very fit.   Will check TSH, mag level and CBC. Note recent normal CMP. Refer 24 hr holter and optomize. His updated medication list for this problem includes:    Aspir-low 81 Mg Tbec (Aspirin) ..... Once daily  Orders: T-CBC w/Diff [phone]) T-TSH [phone]) T- * Misc. Laboratory test [phone]) Misc. Referral (Misc. Ref) EKG w/ Interpretation (93000) Venipuncture (83094)  Problem # 3:  MALAISE AND FATIGUE (ICD-780.79) Resolved with BP Rx switch to noon and hydrating adequately before bike road trips.   Problem # 4:  HYPERLIPIDEMIA (MHW-808.4)  Has not started Crestor.  Will work on [employer] for a little longer. Urged portion control, diet and exersize. Recheck in August. The following medications were removed from the medication list:    Crestor 10 Mg Tabs (Rosuvastatin calcium) ..... One daily at bedtime  Complete Medication List: 1)  Exforge 10-320 Mg Tabs (Amlodipine besylate-valsartan) .... One daily 2)  Nexium 40 Mg Cpdr (Esomeprazole magnesium) .... Once daily 3)  Aspir-low 81 Mg Tbec (Aspirin) .... Once daily 4)  Advil 200 Mg Caps (Ibuprofen) .... 2 every 8 hours as needed 5)  Venlafaxine Hcl 37.5 Mg Tabs (Venlafaxine hcl) .... One two times a day 6)  Fish Oil 1200 Mg Caps (Omega-3 fatty acids) .... Two times a day 7)  Triamcinolone Acetonide 0.025 % Crea (Triamcinolone acetonide) .... Apply to right wrist two times a day for 2 weeks.  Lipid Assessment/Plan:      Based on NCEP/ATP III, the patient's risk factor category is "2 or more risk factors and a calculated 10 year CAD risk of < 20%".  The patient's lipid goals are as follows: Total cholesterol goal is 200; LDL cholesterol goal is 130; HDL cholesterol goal is 40; Triglyceride goal is 150.     Patient Instructions: 1)  Please schedule a follow-up appointment in 6 weeks.      EKG  Procedure date:  07/04/2009  Findings:      abnormal:  Sinus bradycardia with rate of:  48   Appended Document: follow up on sleep study/arc appt scheduled on 07/10/09 @11 :00am at [facility] for  holter monitor. patient informed via message machine. order faxed.LA

## 2011-01-24 NOTE — Letter (Signed)
Summary: REFERRAL FROM DR Hilma Favors  REFERRAL FROM DR Hilma Favors   Imported By: Hoy Morn 12/18/2010 12:24:23  _____________________________________________________________________  External Attachment:    Type:   Image     Comment:   External Document

## 2011-01-24 NOTE — Letter (Signed)
Summary: Recall Colonoscopy/Endoscopy, Change to Office Visit  [facility]  [address]   [place], [place] [address]   Phone: [phone]  Fax: [phone]      December 07, 2010   Tyler Gates [address] [place], [place]  [address] 1956-01-21   Dear [name],   According to our records, it is time for you to schedule a Colonoscopy.  Please call out office and ask to speak to the triage nurse to get this scheduled. You may reach [place] at [phone]. Please do not neglect  your health.   Sincerely,   Waldon Merl LPN  [facility] Ph: [phone]   Fax: [phone]

## 2011-05-07 NOTE — Procedures (Signed)
NAMEMarland Kitchen  NATIVIDAD, HALLS NO.:  [id number]   MEDICAL RECORD NO.:  [id number]          PATIENT TYPE:  OUT   LOCATION:  SLEEP LAB                     FACILITY:  [facility]   PHYSICIAN:  Kathee Delton, MD,FCCPDATE OF BIRTH:  1956/08/31   DATE OF STUDY:  05/25/2009                            NOCTURNAL POLYSOMNOGRAM   REFERRING PHYSICIAN:  Weston Settle, M.D.   REFERRING PHYSICIAN:  Weston Settle, MD.   INDICATION FOR THE STUDY:  Hypersomnia with sleep apnea.   EPWORTH SCORE:  8.   SLEEP ARCHITECTURE:  The patient had total sleep time of 338 minutes  with adequate quantity of slow wave sleep for age, but decreased  quantity of REM.  Sleep onset latency was normal at 18 minutes, and REM  onset was delayed 210 minutes.  Sleep efficiency was decreased at 84%.   RESPIRATORY DATA:  The patient was found to have 5 apneas and 35  obstructive hypopneas for an apnea-hypopnea index of 7 events per hour.  The events occurred primarily in the supine position and there was loud  snoring noted throughout.  The patient did not meet split night criteria  secondary to most of his events occurring after midnight and the small  numbers of events.   OXYGEN DATA:  There was O2 desaturation as low as 81% with his  obstructive events, but it was transient in nature.   CARDIAC DATA:  The patient was found to have an occasional PAC as well  as a one run lasting 3 seconds of SVT at a rate of approximately 140  beats per minute.  He was also noted to have one period of bradycardia,  which was either related to AV block or possibly sinus pauses.  There  was a very short length and it was difficult to assess for small P-  waves.   MOVEMENTS/PARASOMNIA:  The patient had no significant leg jerks or  abnormal behavior seen.   IMPRESSION/RECOMMENDATIONS:  1. Very mild obstructive sleep apnea/hypopnea syndrome with an apnea-      hypopnea index of 7 events per hour and oxygen desaturation  as low      as 81%.  Events occurred primarily in the supine position.  The      patient did not meet split night protocol secondary to the small      numbers of events and most of his events occurring after midnight.      Treatment for this degree of sleep apnea can include a trial of      weight loss alone if applicable, upper airway surgery, oral      appliance, and also CPAP.  Positional therapy could also be tried      in light of most of his events occurring supine.  2. Cardiac arrhythmias noted with occasional premature atrial      contractions, one 3-second run of SVT at 140 beats per minute, and      also 1 period of bradycardia that may be related to      atrioventricular block versus sinus pauses.  If the patient does      not have  a known cardiac history consistent with this,      consideration should be given to a 24-hour Holter monitor for      completeness.      Kathee Delton, MD,FCCP  Diplomate, [place] Board of Sleep  Medicine  Electronically Signed     [facility]/MEDQ  D:  06/02/2009 18:44:49  T:  06/03/2009 03:33:31  Job:  [id number]

## 2011-05-07 NOTE — Assessment & Plan Note (Signed)
[facility] OFFICE NOTE   NAME:Tyler Gates, Tyler Gates                       MRN:          [id number]  DATE:06/29/2007                            DOB:          04/01/1956    IDENTIFICATION:  Tyler Gates is a 55 year old gentleman who was referred  by Dr. Jonna Munro for evaluation of shortness of breath and possible  stress test.   HISTORY OF PRESENT ILLNESS:  The patient has no known history of  coronary artery disease.  He moved here in August.  About a year ago he  was biking about 100 miles per week.  He owns a bike shop.  Since August  with his move, he has been busy.  He injured his knees.  He has not been  biking as much.   Recently he was started on Exforge for high blood pressure.  He said  after this with biking, he gets winded.  He will have to slow down and  pick up and slow down.  No chest pain.  Does activities otherwise  without any limitation.  Notes, though, he has not been biking as much  as he used to.   ALLERGIES:  None.   MEDICATIONS:  1. Effexor 75 mg daily.  2. Nexium 40 mg daily.  3. Exforge 13.9/320 mg daily.  4. Pravastatin 40 mg daily.  5. Lovaza 1 mg four capsules daily, just started.  6. Sulfasalazine p.r.n.  7. Valtrex p.r.n.   PAST MEDICAL HISTORY:  1. Crohn disease diagnosed in 1980, remote steroids.  2. Genital herpes diagnosed in 1996.  3. Hypertension, recent.  4. Dyslipidemia, began 5 years ago treatment.   SOCIAL HISTORY:  The patient never smoked, does not drink.  Owns a bike  store.  He is engaged.  Two children.   FAMILY HISTORY:  Father died of melanoma.  He had PTCA stent x2 in the  patient's 38s.  He died at 34.  Mother is alive.  Paternal grandmother  with coronary disease.  Paternal grandfather with cancer.  Both  deceased.  One brother died as a child.  One sister is alive, may have  cholesterol problems.   REVIEW OF SYSTEMS:  All systems reviewed, negative  to the above problem  except as noted.   NOTE:  The patient did complain that he did not tolerate Lipitor or  Zocor because of achiness.   PHYSICAL EXAMINATION:  On exam, the patient is in no distress.  Blood pressure 110/80, pulse 72, weight 192.  HEENT:  Normocephalic, atraumatic.  EOMI, PERRL.  Throat clear.  NECK:  JVP is normal, no masses, no bruits.  No lymphadenopathy.  No  thyromegaly.  LUNGS:  Clear to auscultation without rales or wheezes.  CARDIAC:  Regular rate and rhythm, S1, S2.  No S3, S4, or murmurs.  ABDOMEN:  Supple, nontender, no masses, no hepatomegaly, normal bowel  sounds.  EXTREMITIES:  Good distal pulses throughout.  No lower extremity edema.   Twelve-lead EKG:  Normal sinus rhythm.   Lipids (again reflecting 40 mg of Pravachol) include total cholesterol  of 243, triglycerides  332, HDL of 36, LDL of 141.   Tyler Gates is a 55 year old gentleman who is being referred for shortness  of breath.  I am not convinced the shortness of breath is more than  deconditioning.  With other activities he is not having problems with  breathing or with chest pain.  Again, with biking no chest pain.  He is  able to increase his speed.   We spent a long time talking about risk stratification.  We talked about  stress test, nuclear stress test, cardiac CT and cardiac  catheterization.  Again, he has many questions, which were appropriate.   In regard to shortness of breath, I am not convinced it is an anginal  equivalent but we do need to follow.  The way to screen for any coronary  artery disease, of course, would be a cardiac CT looking for more mild  plaquing.   In regard to his lipids and in light of his family history, I think it  would be important to define his risk better and I have set him up for a  LipoMed test to evaluate for particle sizes.  We will also check a CRP  at that time.  Indeed, he just began on the Lovaza not long ago and  blood work should reflect  some of this.  I will be in touch with him in  order to proceed.  Again, I encouraged him to stay active and take  activities as tolerated and really follow his response with exercise.  I  will be in touch with the patient then about follow-up once we have  discussed his labs.     Fay Records, MD, [facility]  Electronically Signed    PVR/MedQ  DD: 06/29/2007  DT: 06/30/2007  Job #: [id number]   cc:   Weston Settle, M.D.

## 2013-02-16 ENCOUNTER — Encounter: Payer: Self-pay | Admitting: Internal Medicine

## 2013-02-17 ENCOUNTER — Encounter: Payer: Self-pay | Admitting: Gastroenterology

## 2013-02-17 ENCOUNTER — Ambulatory Visit (INDEPENDENT_AMBULATORY_CARE_PROVIDER_SITE_OTHER): Payer: BC Managed Care – PPO | Admitting: Gastroenterology

## 2013-02-17 VITALS — BP 135/83 | HR 80 | Temp 97.4°F | Ht 69.0 in | Wt 189.4 lb

## 2013-02-17 DIAGNOSIS — Z8719 Personal history of other diseases of the digestive system: Secondary | ICD-10-CM

## 2013-02-17 DIAGNOSIS — K219 Gastro-esophageal reflux disease without esophagitis: Secondary | ICD-10-CM

## 2013-02-17 MED ORDER — SULFASALAZINE 500 MG PO TABS: 500.0000 mg | ORAL_TABLET | Freq: Four times a day (QID) | ORAL | Status: DC

## 2013-02-17 NOTE — Assessment & Plan Note (Signed)
No dysphagia. Continue Nexium daily.

## 2013-02-17 NOTE — Assessment & Plan Note (Signed)
57 year old male with history of Crohn's disease, appears to be in clinical remission. However, he has not had a colonoscopy since 2007. He was recommended to have surveillance in 2012; however, he is concerned about out-of-pocket expenses, which is understandable. I discussed the importance of surveillance colonoscopy, and he is fully aware of the reason for this and agreeable to proceed once he knows more regarding the financial aspect. He is requesting a refill of sulfasalazine, which works well for him, just to have on hand if needed. We will refill this, obtain labs from Dr. Hilma Favors, and inquire about out-of-pocket expenses. Return in 1 year regardless. Hopefully, we can proceed with a colonoscopy in the near future with Dr. Gala Romney. I discussed the risks and benefits at the time of the visit. He stated understanding.

## 2013-02-17 NOTE — Patient Instructions (Addendum)
I have sent a refill for your medication to your pharmacy.   We will check into the out-of-pocket expense for the colonoscopy. It is highly recommended you proceed with one in the near future, but I understand the need to plan ahead due to expenses.  Regardless, we will see you again in 1 year.

## 2013-02-17 NOTE — Progress Notes (Signed)
Primary Care Physician:  Purvis Kilts, MD Primary Gastroenterologist:  Dr. Gala Romney   Chief Complaint  Patient presents with  . Medication Refill    HPI:   [name] is a pleasant 57 year old male with a history of Crohn's, clinically in remission, with last colonoscopy in 2007. 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 He was last seen by Dr. Gala Romney in 2011 and recommended an updated surveillance colonoscopy. He is no longer on sulfasalazine regularly; however, he takes this for "flares" approximately a month at a time, with improved symptoms. His last refill on sulfasalazine was in 2007. His flare is characterized by foul gas, sharp abdominal twinges. He is doing great currently. Notes a "flare" approximately once every three years. Just wants a refill "in case". BM usually daily. Usually a 4 on the [facility] scale. No rectal bleeding. Wt is stable, good appetite. Nexium for GERD. Takes daily. No dysphagia.   Past Medical History  Diagnosis Date  . Hyperlipidemia   . Dyspnea on exertion   . Snoring   . Instability of knee joint   . Hypertension   . GERD (gastroesophageal reflux disease)   . Depression   . Crohn's disease     Past Surgical History  Procedure Laterality Date  . Colonoscopy  2007    Dr. Sabino Donovan normal TI, ascending colon/cecum with mild inflammation noted but biopses without signs of active inflammation, hyperpastic polyp, possible ischemia on path of left colon biopsy  . Esophagogastroduodenoscopy  2007    Dr. Sabino Donovan reflux esophagitis, hiatal hernia, negative H.pylori    Current Outpatient Prescriptions  Medication Sig Dispense Refill  . aspirin 81 MG tablet Take 81 mg by mouth daily.      Marland Kitchen esomeprazole (NEXIUM) 40 MG capsule Take 40 mg by mouth daily before breakfast.      . EXFORGE 10-320 MG per tablet Take 1 tablet by mouth daily.       Marland Kitchen ezetimibe (ZETIA) 10 MG tablet Take 10 mg by mouth daily.      Marland Kitchen sulfaSALAzine (AZULFIDINE)  500 MG tablet Take 1 tablet (500 mg total) by mouth 4 (four) times daily.  120 tablet  3  . venlafaxine (EFFEXOR) 75 MG tablet Take 75 mg by mouth 2 (two) times daily.       No current facility-administered medications for this visit.    Allergies as of 02/17/2013 - Review Complete 02/17/2013  Allergen Reaction Noted  . Statins  03/06/2007    Family History  Problem Relation Age of Onset  . Colon cancer Neg Hx     History   Social History  . Marital Status: Married    Spouse Name: N/A    Number of Children: N/A  . Years of Education: N/A   Occupational History  . Not on file.   Social History Main Topics  . Smoking status: Never Smoker   . Smokeless tobacco: Not on file  . Alcohol Use: Yes     Comment: glass a wine once a week  . Drug Use: No  . Sexually Active: Not on file   Other Topics Concern  . Not on file   Social History Narrative  . No narrative on file    Review of Systems: Gen: Denies any fever, chills, fatigue, weight loss, lack of appetite.  CV: Denies chest pain, heart palpitations, peripheral edema, syncope.  Resp: Denies shortness of breath at rest or with exertion. Denies wheezing or cough.  GI: Denies dysphagia  or odynophagia. Denies jaundice, hematemesis, fecal incontinence. GU : Denies urinary burning, urinary frequency, urinary hesitancy MS: Denies joint pain, muscle weakness, cramps, or limitation of movement.  Derm: Denies rash, itching, dry skin Psych: Denies depression, anxiety, memory loss, and confusion Heme: Denies bruising, bleeding, and enlarged lymph nodes.  Physical Exam: BP 135/83  Pulse 80  Temp(Src) 97.4 F (36.3 C) (Oral)  Ht 5' 9"  (1.753 m)  Wt 189 lb 6.4 oz (85.911 kg)  BMI 27.96 kg/m2 General:   Alert and oriented. Pleasant and cooperative. Well-nourished and well-developed.  Head:  Normocephalic and atraumatic. Eyes:  Without icterus, sclera clear and conjunctiva pink.  Ears:  Normal auditory acuity. Nose:  No  deformity, discharge,  or lesions. Mouth:  No deformity or lesions, oral mucosa pink.  Neck:  Supple, without mass or thyromegaly. Lungs:  Clear to auscultation bilaterally. No wheezes, rales, or rhonchi. No distress.  Heart:  S1, S2 present without murmurs appreciated.  Abdomen:  +BS, soft, non-tender and non-distended. No HSM noted. No guarding or rebound. No masses appreciated.  Rectal:  Deferred  Msk:  Symmetrical without gross deformities. Normal posture. Extremities:  Without clubbing or edema. Neurologic:  Alert and  oriented x4;  grossly normal neurologically. Skin:  Intact without significant lesions or rashes. Cervical Nodes:  No significant cervical adenopathy. Psych:  Alert and cooperative. Normal mood and affect.

## 2013-02-17 NOTE — Progress Notes (Signed)
Faxed to PCP

## 2013-09-16 NOTE — Progress Notes (Signed)
Outside labs from Nov 2013:   Hgb 15.6, Hct 44, Plts 302, LFTs normal, BUN and Cr normal.  Patient was curious regarding out of pocket expense for colonoscopy (his expense after insurance). I am not sure how to answer this; he needs a surveillance colonoscopy, hx of Crohn's disease. Last TCS in 2007.   We can triage him for a colonoscopy if he is willing to proceed.

## 2013-09-20 NOTE — Progress Notes (Signed)
Noted. Thanks so much.

## 2013-09-20 NOTE — Progress Notes (Signed)
Pt returned my call. He said he is doing good at this time. The Sulfasalazine helped a lot. He would like to wait until after the first of the year because he will have new insurance and better coverage. I have put him on my call list for Jan 2015.

## 2013-09-20 NOTE — Progress Notes (Signed)
LMOM to call.

## 2013-09-27 LAB — COMPREHENSIVE METABOLIC PANEL
ALT: 28 U/L (ref 10–40)
AST: 29 U/L
Alkaline Phosphatase: 66 U/L
BUN: 17 mg/dL (ref 4–21)
Creat: 1.15
Total Bilirubin: 0.6 mg/dL
Total Protein: 7.5 g/dL

## 2013-09-27 LAB — CBC
HCT: 44 %
HGB: 15.6 g/dL

## 2014-01-12 ENCOUNTER — Telehealth: Payer: Self-pay

## 2014-01-12 NOTE — Telephone Encounter (Signed)
Pt seen Tyler Emperor, NP last year in February. Pt wanted to wait til this year for his next colonoscopy. He got different insurance. He has history of Crohn's. I called pt and he is at work and will call me Friday to schedule.

## 2014-03-30 NOTE — Telephone Encounter (Signed)
Letter reminder mailed to pt.

## 2015-01-19 ENCOUNTER — Telehealth: Payer: Self-pay | Admitting: Internal Medicine

## 2015-01-19 MED ORDER — SULFASALAZINE 500 MG PO TABS: 500.0000 mg | ORAL_TABLET | Freq: Four times a day (QID) | ORAL | Status: DC

## 2015-01-19 NOTE — Telephone Encounter (Signed)
Pt is aware.  

## 2015-01-19 NOTE — Addendum Note (Signed)
Addended by: Mahala Menghini on: 01/19/2015 01:59 PM   Modules accepted: Orders

## 2015-01-19 NOTE — Telephone Encounter (Signed)
Pt has an appt for 02/08/15 with [facility]. Can he get rx for sulfasalazine to last until his ov?

## 2015-01-19 NOTE — Telephone Encounter (Signed)
PATIENT CAME IN OFFICE WITH A [age] YEAR OLD PRESCRIPTION.  SAID HIS PHARMACY WILL NOT REFILL IT AND HE STATES HE NEEDS SOME NOW.  HAS NOT BEEN HERE SINCE 2014 AND TOLD HIM i COULD MAKE HIM AN APPOINTMENT FOR February AND HE SAID HE COULD NOT WAIT THAT LONG.  PLEASE CALL PATIENT

## 2015-01-19 NOTE — Telephone Encounter (Signed)
PATIENT RETURNED TO OFFICE AND MADE APPOINTMENT, ALSO HOPING HE CAN GET ENOUGH TO LAST HIM UNTIL THEN

## 2015-01-19 NOTE — Telephone Encounter (Addendum)
RX for one month completed.  Please let patient know this is older type medication for Crohn's, used infrequently. Can discuss appropriate management at upcoming [place].

## 2015-01-20 ENCOUNTER — Encounter: Payer: Self-pay | Admitting: Internal Medicine

## 2015-01-20 ENCOUNTER — Ambulatory Visit (INDEPENDENT_AMBULATORY_CARE_PROVIDER_SITE_OTHER): Payer: Federal, State, Local not specified - PPO | Admitting: Internal Medicine

## 2015-01-20 VITALS — BP 126/80 | HR 95 | Ht 69.0 in | Wt 184.7 lb

## 2015-01-20 DIAGNOSIS — E785 Hyperlipidemia, unspecified: Secondary | ICD-10-CM

## 2015-01-20 DIAGNOSIS — I10 Essential (primary) hypertension: Secondary | ICD-10-CM

## 2015-01-20 MED ORDER — VALSARTAN 160 MG PO TABS: 160.0000 mg | ORAL_TABLET | Freq: Every day | ORAL | Status: DC

## 2015-01-20 NOTE — Patient Instructions (Signed)
Your physician has recommended you make the following change in your medication...  1. STOP AZOR 2. START valsartan 156m daily   Your physician recommends that you return for lab work at your convenience - fasting.   Your physician recommends that you schedule a follow-up appointment in: 2 weeks with KErasmo Downer(pharmacist) for BP check   Your physician recommends that you schedule a follow-up appointment in: 3 months with Dr. HDebara Pickett

## 2015-01-20 NOTE — Progress Notes (Signed)
OFFICE NOTE  Chief Complaint:  Hypertension  Primary Care Physician: Purvis Kilts, MD  HPI:  Tyler Gates this pleasant 59 year old male that I last saw in 2014. He has a history of hypertension and marked dyslipidemia and has been intolerant to statins. He's an avid cyclist but does have a family history of heart disease which is significant and his father had coronary events at an early age. He underwent metabolic testing in December 2013 which showed an RER of 1.08, peak VO2 01 14% predicted which was excellent. At the time he had good control over his blood pressure however recently he's been having problems with low blood pressure. He says in the fall his blood pressure was elevated and he was switched from Exforge 10/320 mg daily to valsartan 320 mg daily. During bike rides an exercise he felt that he would get dizzy at times and thought that his blood pressure possibly was getting low. He did see some systolic blood pressures in the 90s. Subsequently he was changed over to Azor 5/40 mg daily. He says he's uncomfortable with this medicine and has some unusual side effects including dizziness. He unfortunately has not gotten treatment of his dyslipidemia which was remarkable. His total cholesterol was 266, with triglycerides 439, HDL 41 and LDL particle #2758 in 2014. He denies any chest pain or worsening shortness of breath with exertion.  PMHx:  Past Medical History  Diagnosis Date  . Hyperlipidemia   . Dyspnea on exertion   . Snoring   . Instability of knee joint   . Hypertension   . GERD (gastroesophageal reflux disease)   . Depression   . Crohn's disease     Past Surgical History  Procedure Laterality Date  . Colonoscopy  2007    Dr. Sabino Donovan normal TI, ascending colon/cecum with mild inflammation noted but biopses without signs of active inflammation, hyperpastic polyp, possible ischemia on path of left colon biopsy  . Esophagogastroduodenoscopy  2007    Dr. Sabino Donovan  reflux esophagitis, hiatal hernia, negative H.pylori    FAMHx:  Family History  Problem Relation Age of Onset  . Colon cancer Neg Hx     SOCHx:   reports that he has never smoked. He does not have any smokeless tobacco history on file. He reports that he drinks alcohol. He reports that he does not use illicit drugs.  ALLERGIES:  Allergies  Allergen Reactions  . Statins     REACTION: Myalgia    ROS: A comprehensive review of systems was negative except for: Neurological: positive for dizziness and headaches  HOME MEDS: Current Outpatient Prescriptions  Medication Sig Dispense Refill  . ANDROGEL 50 MG/5GM (1%) GEL daily. Apply as directed every morning  2  . aspirin 81 MG tablet Take 81 mg by mouth daily.    Marland Kitchen esomeprazole (NEXIUM) 40 MG capsule Take 40 mg by mouth daily before breakfast.    . sulfaSALAzine (AZULFIDINE) 500 MG tablet Take 1 tablet (500 mg total) by mouth 4 (four) times daily. 120 tablet 0  . venlafaxine (EFFEXOR) 75 MG tablet Take 75 mg by mouth 2 (two) times daily.    . valsartan (DIOVAN) 160 MG tablet Take 1 tablet (160 mg total) by mouth daily. 30 tablet 6   No current facility-administered medications for this visit.    LABS/IMAGING: No results found for this or any previous visit (from the past 48 hour(s)). No results found.  VITALS: BP 126/80 mmHg  Pulse 95  Ht 5'  9" (1.753 m)  Wt 184 lb 11.2 oz (83.779 kg)  BMI 27.26 kg/m2  EXAM: General appearance: alert and no distress Neck: no carotid bruit and no JVD Lungs: clear to auscultation bilaterally Heart: regular rate and rhythm, S1, S2 normal, no murmur, click, rub or gallop Abdomen: soft, non-tender; bowel sounds normal; no masses,  no organomegaly Extremities: extremities normal, atraumatic, no cyanosis or edema Pulses: 2+ and symmetric Skin: Skin color, texture, turgor normal. No rashes or lesions Neurologic: Grossly normal Psych: Pleasant  EKG: Normal sinus rhythm at 95, nonspecific T  wave changes  ASSESSMENT: 1. Labile hypertension 2. Untreated dyslipidemia  PLAN: 1.   Tyler Gates has labile blood pressures and probably is on too much medication at this point. He's lost about 10 pounds and continues to be active. He's had problems with exercising where he gets dizzy and blood pressure appears to be low. I think that Azor may be too strong for him. I would recommend going back to valsartan and however decreasing the dose to 160 mg daily. We will schedule follow-up with Erasmo Downer, our hypertension pharmacist for further titration of his medications. We also had a long discussion today about his cholesterol. I like to reassess that as he may be good candidate for other medication such as Zetia or low-dose Crestor. Of course he may also be candidate for PCSK9 inhibitors given the significance of his elevated cholesterol. This may be consistent with familial hypercholesterolemia.  Pixie Casino, MD, [facility] Attending Cardiologist [facility]  HILTY,Kenneth C 01/20/2015, 6:17 PM

## 2015-02-03 ENCOUNTER — Ambulatory Visit: Payer: Federal, State, Local not specified - PPO | Admitting: Pharmacist Clinician (PhC)/ Clinical Pharmacy Specialist

## 2015-02-08 ENCOUNTER — Ambulatory Visit: Payer: Self-pay | Admitting: Gastroenterology

## 2015-02-15 ENCOUNTER — Ambulatory Visit: Payer: Federal, State, Local not specified - PPO | Admitting: Pharmacist Clinician (PhC)/ Clinical Pharmacy Specialist

## 2015-03-07 ENCOUNTER — Other Ambulatory Visit: Payer: Self-pay | Admitting: Internal Medicine

## 2015-03-08 LAB — NMR, LIPOPROFILE
Cholesterol: 279 mg/dL — ABNORMAL HIGH (ref 100–199)
HDL Cholesterol by NMR: 35 mg/dL — ABNORMAL LOW (ref >39)
HDL Particle Number: 26 umol/L — ABNORMAL LOW (ref >=30.5)
LDL Particle Number: 2151 nmol/L — ABNORMAL HIGH (ref <1000)
LDL Size: 19.7 nm (ref >20.5)
LDL-C: 183 mg/dL — ABNORMAL HIGH (ref 0–99)
LP-IR Score: 65 — ABNORMAL HIGH (ref <=45)
Small LDL Particle Number: 1654 nmol/L — ABNORMAL HIGH (ref <=527)
Triglycerides by NMR: 303 mg/dL — ABNORMAL HIGH (ref 0–149)

## 2015-03-15 ENCOUNTER — Telehealth: Payer: Self-pay | Admitting: *Deleted

## 2015-03-15 DIAGNOSIS — E785 Hyperlipidemia, unspecified: Secondary | ICD-10-CM

## 2015-03-15 MED ORDER — EZETIMIBE 10 MG PO TABS: 10.0000 mg | ORAL_TABLET | Freq: Every day | ORAL | Status: DC

## 2015-03-15 NOTE — Telephone Encounter (Signed)
-----   Message from Pixie Casino, MD sent at 03/15/2015  2:40 PM EDT ----- Cholesterol remains very high - would he be willing to try Crestor 5 mg daily (don't think he has tried that before).  If not, would recommend Zetia 10 mg daily. Re-check lipid NMR in 3 months.  Dr. Lemmie Evens

## 2015-03-15 NOTE — Telephone Encounter (Signed)
Rx(s) sent to pharmacy electronically & lab ordered and slip mailed to patient.  Patient notified and agreeable to take zetia 32m QD  He reports he was not able to make BP check appointments with KErasmo Downerin Feb r/t scheduling and delay in getting back to be seen. Rescheduled for Wed April 6th  OV in May with Dr. HDebara Pickett

## 2015-03-29 ENCOUNTER — Ambulatory Visit (INDEPENDENT_AMBULATORY_CARE_PROVIDER_SITE_OTHER): Payer: BLUE CROSS/BLUE SHIELD | Admitting: Pharmacist Clinician (PhC)/ Clinical Pharmacy Specialist

## 2015-03-29 VITALS — BP 150/100 | HR 72 | Ht 69.0 in | Wt 182.2 lb

## 2015-03-29 DIAGNOSIS — I10 Essential (primary) hypertension: Secondary | ICD-10-CM

## 2015-03-29 NOTE — Patient Instructions (Signed)
Return for a a follow up appointment in 3-4 weeks   Your blood pressure today is 150/100  Check your blood pressure at home daily and keep record of the readings.  Take your BP meds as follows: continue with valsartan 160 mg daily  Bring all of your meds, your BP cuff and your record of home blood pressures to your next appointment.  Exercise as you're able, try to walk approximately 30 minutes per day.  Keep salt intake to a minimum, especially watch canned and prepared boxed foods.  Eat more fresh fruits and vegetables and fewer canned items.  Avoid eating in fast food restaurants.    HOW TO TAKE YOUR BLOOD PRESSURE: . Rest 5 minutes before taking your blood pressure. .  Don't smoke or drink caffeinated beverages for at least 30 minutes before. . Take your blood pressure before (not after) you eat. . Sit comfortably with your back supported and both feet on the floor (don't cross your legs). . Elevate your arm to heart level on a table or a desk. . Use the proper sized cuff. It should fit smoothly and snugly around your bare upper arm. There should be enough room to slip a fingertip under the cuff. The bottom edge of the cuff should be 1 inch above the crease of the elbow. . Ideally, take 3 measurements at one sitting and record the average.

## 2015-04-02 ENCOUNTER — Encounter: Payer: Self-pay | Admitting: Pharmacist Clinician (PhC)/ Clinical Pharmacy Specialist

## 2015-04-02 NOTE — Assessment & Plan Note (Signed)
Today his BP is slightly elevated in the office at 150/100.  There was no appreciable difference when he stood.  He is hesitant to add more medication, as he feels it interferes with his lifestyle, causing him to be dizzy and not able to bike.  I suggested that he continue to take home BP readings each day and record them.  He will return in about 3-4 weeks and bring both the log of his readings as well as the cuff itself.  At that time we can determine whether the problem is just in the office or if his cuff lacks accuracy.

## 2015-04-02 NOTE — Progress Notes (Signed)
     04/02/2015 TIMM BONENBERGER 1956/10/08 [id number]   HPI:  Tyler Gates is a 59 y.o. male patient of Dr Debara Pickett, with a PMH below who presents today for hypertension clinic evaluation.   Cardiac Hx:  Hypertension, hyperlipidemia  Family Hx: father had cardiac disease at early age, died of melanoma  Social Hx: no tobacco or alcohol, drinks 4-5 cups coffee/day including [employer]  Diet: good per patient, watches fat intake, only uses salt on occasion  Exercise: avid cyclist for 30 plus years, often cycles 20-30 miles in a day  Home BP readings: all WNL per patient.  No systolic readings > 343, HR usually in the 50s  Current antihypertensive medications: valsartan 160.  Of note he has previously been on Exforge, Azor and valsartan 320 mg, all of which made the patient feel dizzy, and low energy.  He suspects that they were lowering his BP too much.   Current Outpatient Prescriptions  Medication Sig Dispense Refill  . ANDROGEL 50 MG/5GM (1%) GEL daily. Apply as directed every morning  2  . aspirin 81 MG tablet Take 81 mg by mouth daily.    Marland Kitchen esomeprazole (NEXIUM) 40 MG capsule Take 40 mg by mouth daily before breakfast.    . ezetimibe (ZETIA) 10 MG tablet Take 1 tablet (10 mg total) by mouth daily. 90 tablet 1  . sulfaSALAzine (AZULFIDINE) 500 MG tablet Take 1 tablet (500 mg total) by mouth 4 (four) times daily. 120 tablet 0  . valsartan (DIOVAN) 160 MG tablet Take 1 tablet (160 mg total) by mouth daily. 30 tablet 6  . venlafaxine (EFFEXOR) 75 MG tablet Take 75 mg by mouth 2 (two) times daily.     No current facility-administered medications for this visit.    Allergies  Allergen Reactions  . Statins     REACTION: Myalgia    Past Medical History  Diagnosis Date  . Hyperlipidemia   . Dyspnea on exertion   . Snoring   . Instability of knee joint   . Hypertension   . GERD (gastroesophageal reflux disease)   . Depression   . Crohn's disease     Blood pressure 150/100,  pulse 72, height 5' 9"  (1.753 m), weight 182 lb 3.2 oz (82.645 kg).    Tommy Medal PharmD CPP [facility]

## 2015-04-06 ENCOUNTER — Telehealth: Payer: Self-pay | Admitting: Pharmacist Clinician (PhC)/ Clinical Pharmacy Specialist

## 2015-04-06 NOTE — Telephone Encounter (Signed)
Pt called, LMOM, concerned that BP still in the 150/100 range.    Returned call, states BP mostly 150/100, occasionally down to 140s.  Will increase valsartan to 320 mg qd (pt may try 160 bid), he will call in another week if no changes to BP

## 2015-04-17 ENCOUNTER — Telehealth: Payer: Self-pay | Admitting: Pharmacist Clinician (PhC)/ Clinical Pharmacy Specialist

## 2015-04-17 NOTE — Telephone Encounter (Signed)
Pt was concerned about valsartan.  Took brand Diovan in the past (before generic available) and wanted to know if we could switch to brand.   Pt has appointment with me on Wednesday.  Advised that we wait until appt on Wednesday and evaluate BP readings.  If doing well will leave with generic, but if still elevated, maybe try brand for 1 month to see if any improvement.  Pt agreeable to plan

## 2015-04-19 ENCOUNTER — Ambulatory Visit (INDEPENDENT_AMBULATORY_CARE_PROVIDER_SITE_OTHER): Payer: BLUE CROSS/BLUE SHIELD | Admitting: Pharmacist Clinician (PhC)/ Clinical Pharmacy Specialist

## 2015-04-19 VITALS — BP 142/100 | HR 72 | Ht 69.0 in | Wt 180.8 lb

## 2015-04-19 DIAGNOSIS — I10 Essential (primary) hypertension: Secondary | ICD-10-CM

## 2015-04-19 MED ORDER — VALSARTAN 320 MG PO TABS: 320.0000 mg | ORAL_TABLET | Freq: Every day | ORAL | Status: DC

## 2015-04-19 MED ORDER — HYDRALAZINE HCL 25 MG PO TABS: 25.0000 mg | ORAL_TABLET | Freq: Three times a day (TID) | ORAL | Status: DC

## 2015-04-19 NOTE — Assessment & Plan Note (Signed)
Today his BP remains elevated, both systolic and diastolic.  He is hesitant about adding more meds.  Previously took [employer] but that made him dizzy and weak, to the point where he could not finish bike rides.  Will avoid amlodipine for that reason at this time, but did warn him that he may need to consider it later.  Also hesitant about HCTZ or chlorthalidone because of the amount of time he spends outside on his bike - afraid he might become more easily dehydrated.  For now we will add hydralazine 25 mg twice daily.  He is to continue with the valsartan 320 mg and call me in 3-4 weeks to let me know if this is helping his readings.  He did bring his home cuff in today and it was found to be accurate within 2 points.

## 2015-04-19 NOTE — Progress Notes (Signed)
     04/19/2015 Tyler Gates 01/03/1956 [id number]   HPI:  Tyler Gates is a 59 y.o. male patient of Dr Debara Pickett, with a PMH below who presents today for hypertension clinic follow up visit.   Cardiac Hx:  Hypertension, hyperlipidemia  Family Hx: father had cardiac disease at early age, died of melanoma  Social Hx: no tobacco or alcohol, drinks 4-5 cups coffee/day including [employer]  Diet: good per patient, watches fat intake, only uses salt on occasion  Exercise: avid cyclist for 30 plus years, often cycles 20-30 miles in a day  Home BP readings: mostly in the 945-859Y systolic, high of 924, low of 462; diastolic 86-381R, with only 2 readings below 90.  Current antihypertensive medications: valsartan 320.  Of note he has previously been on Exforge, Azor and valsartan 320 mg, all of which made the patient feel dizzy, and low energy.  He suspects that they were lowering his BP too much.   Current Outpatient Prescriptions  Medication Sig Dispense Refill  . ANDROGEL 50 MG/5GM (1%) GEL daily. Apply as directed every morning  2  . aspirin 81 MG tablet Take 81 mg by mouth daily.    Marland Kitchen esomeprazole (NEXIUM) 40 MG capsule Take 40 mg by mouth daily before breakfast.    . ezetimibe (ZETIA) 10 MG tablet Take 1 tablet (10 mg total) by mouth daily. 90 tablet 1  . hydrALAZINE (APRESOLINE) 25 MG tablet Take 1 tablet (25 mg total) by mouth 3 (three) times daily. 180 tablet 1  . sulfaSALAzine (AZULFIDINE) 500 MG tablet Take 1 tablet (500 mg total) by mouth 4 (four) times daily. 120 tablet 0  . valsartan (DIOVAN) 320 MG tablet Take 1 tablet (320 mg total) by mouth daily. 90 tablet 1  . venlafaxine (EFFEXOR) 75 MG tablet Take 75 mg by mouth 2 (two) times daily.     No current facility-administered medications for this visit.    Allergies  Allergen Reactions  . Statins     REACTION: Myalgia    Past Medical History  Diagnosis Date  . Hyperlipidemia   . Dyspnea on exertion   . Snoring   .  Instability of knee joint   . Hypertension   . GERD (gastroesophageal reflux disease)   . Depression   . Crohn's disease     Blood pressure 142/100, pulse 72, height 5' 9"  (1.753 m), weight 180 lb 12.8 oz (82.01 kg).    Tommy Medal PharmD CPP [facility]

## 2015-04-19 NOTE — Patient Instructions (Signed)
Call in 3-4 weeks and let me know how your BP is doing.  Tyler Gates [phone]  Your blood pressure today is 160/100  Check your blood pressure at home daily and keep record of the readings.  Take your BP meds as follows: continue with valsartan 320 mg daily.  Add HYDRALAZINE 25 mg twice daily  Bring all of your meds, your BP cuff and your record of home blood pressures to your next appointment.  Exercise as you're able, try to walk approximately 30 minutes per day.  Keep salt intake to a minimum, especially watch canned and prepared boxed foods.  Eat more fresh fruits and vegetables and fewer canned items.  Avoid eating in fast food restaurants.    HOW TO TAKE YOUR BLOOD PRESSURE: . Rest 5 minutes before taking your blood pressure. .  Don't smoke or drink caffeinated beverages for at least 30 minutes before. . Take your blood pressure before (not after) you eat. . Sit comfortably with your back supported and both feet on the floor (don't cross your legs). . Elevate your arm to heart level on a table or a desk. . Use the proper sized cuff. It should fit smoothly and snugly around your bare upper arm. There should be enough room to slip a fingertip under the cuff. The bottom edge of the cuff should be 1 inch above the crease of the elbow. . Ideally, take 3 measurements at one sitting and record the average.

## 2015-04-24 ENCOUNTER — Ambulatory Visit: Payer: Federal, State, Local not specified - PPO | Admitting: Internal Medicine

## 2015-05-12 ENCOUNTER — Encounter: Payer: Self-pay | Admitting: Internal Medicine

## 2015-05-17 ENCOUNTER — Encounter: Payer: Self-pay | Admitting: Internal Medicine

## 2015-05-17 ENCOUNTER — Ambulatory Visit (INDEPENDENT_AMBULATORY_CARE_PROVIDER_SITE_OTHER): Payer: BLUE CROSS/BLUE SHIELD | Admitting: Internal Medicine

## 2015-05-17 VITALS — BP 118/84 | HR 75 | Ht 69.0 in | Wt 181.8 lb

## 2015-05-17 DIAGNOSIS — I10 Essential (primary) hypertension: Secondary | ICD-10-CM

## 2015-05-17 MED ORDER — OLMESARTAN MEDOXOMIL 20 MG PO TABS: 20.0000 mg | ORAL_TABLET | Freq: Every day | ORAL | Status: DC

## 2015-05-17 MED ORDER — AMLODIPINE BESYLATE 2.5 MG PO TABS: 2.5000 mg | ORAL_TABLET | Freq: Two times a day (BID) | ORAL | Status: DC

## 2015-05-17 NOTE — Patient Instructions (Addendum)
Your physician has recommended you make the following change in your medication...  1. STOP AZOR 2. START amlodipine 2.31m twice daily (about every 12 hours) 3. START olmesartan (benicar) 224mdaily   Your physician [place]

## 2015-05-17 NOTE — Progress Notes (Signed)
OFFICE NOTE  Chief Complaint:  Hypertension  Primary Care Physician: Tyler Kilts, MD  HPI:  Tyler Gates this pleasant 59 year old male that I last saw in 2014. He has a history of hypertension and marked dyslipidemia and has been intolerant to statins. He's an avid cyclist but does have a family history of heart disease which is significant and his father had coronary events at an early age. He underwent metabolic testing in December 2013 which showed an RER of 1.08, peak VO2 01 14% predicted which was excellent. At the time he had good control over his blood pressure however recently he's been having problems with low blood pressure. He says in the fall his blood pressure was elevated and he was switched from Exforge 10/320 mg daily to valsartan 320 mg daily. During bike rides an exercise he felt that he would get dizzy at times and thought that his blood pressure possibly was getting low. He did see some systolic blood pressures in the 90s. Subsequently he was changed over to Azor 5/40 mg daily. He says he's uncomfortable with this medicine and has some unusual side effects including dizziness. He unfortunately has not gotten treatment of his dyslipidemia which was remarkable. His total cholesterol was 266, with triglycerides 439, HDL 41 and LDL particle #2758 in 2014. He denies any chest pain or worsening shortness of breath with exertion.  Tyler Gates returns today for follow-up. He reports some continued lability in his blood pressures. He felt that the valsartan was working fairly well but he still has some problems with dizziness and lower blood pressure. He saw Tyler Gates, our pharmacist who made adjustments in his medications. He has since gone back to [name]. He feels like the worst time is when he takes the medicine and doesn't ride or bicycling within a couple hours of taking the medicine and he feels dizzy and her has low blood pressure. He then has had a few episodes were his blood  pressures been elevated. Once when he was on a trip to [place] and in the car for several hours. He was under a lot of stress and blood pressure got up to the 170s which he had a headache.  PMHx:  Past Medical History  Diagnosis Date  . Hyperlipidemia   . Dyspnea on exertion   . Snoring   . Instability of knee joint   . Hypertension   . GERD (gastroesophageal reflux disease)   . Depression   . Crohn's disease     Past Surgical History  Procedure Laterality Date  . Colonoscopy  2007    Dr. Sabino Gates normal TI, ascending colon/cecum with mild inflammation noted but biopses without signs of active inflammation, hyperpastic polyp, possible ischemia on path of left colon biopsy  . Esophagogastroduodenoscopy  2007    Dr. Sabino Gates reflux esophagitis, hiatal hernia, negative H.pylori    FAMHx:  Family History  Problem Relation Age of Onset  . Colon cancer Neg Hx     SOCHx:   reports that he has never smoked. He does not have any smokeless tobacco history on file. He reports that he drinks alcohol. He reports that he does not use illicit drugs.  ALLERGIES:  Allergies  Allergen Reactions  . Statins     REACTION: Myalgia    ROS: A comprehensive review of systems was negative except for: Neurological: positive for dizziness and headaches  HOME MEDS: Current Outpatient Prescriptions  Medication Sig Dispense Refill  . ANDROGEL 50 MG/5GM (1%)  GEL daily. Apply as directed every morning  2  . aspirin 81 MG tablet Take 81 mg by mouth daily.    Marland Kitchen esomeprazole (NEXIUM) 40 MG capsule Take 40 mg by mouth daily before breakfast.    . ezetimibe (ZETIA) 10 MG tablet Take 1 tablet (10 mg total) by mouth daily. 90 tablet 1  . sulfaSALAzine (AZULFIDINE) 500 MG tablet Take 500 mg by mouth as needed (QID as needed.).    Marland Kitchen venlafaxine (EFFEXOR) 75 MG tablet Take 75 mg by mouth 2 (two) times daily.    Marland Kitchen amLODipine (NORVASC) 2.5 MG tablet Take 1 tablet (2.5 mg total) by mouth every 12 (twelve) hours. 180  tablet 3  . olmesartan (BENICAR) 20 MG tablet Take 1 tablet (20 mg total) by mouth daily. 90 tablet 1   No current facility-administered medications for this visit.    LABS/IMAGING: No results found for this or any previous visit (from the past 48 hour(s)). No results found.  VITALS: BP 118/84 mmHg  Pulse 75  Ht 5' 9"  (1.753 m)  Wt 181 lb 12.8 oz (82.464 kg)  BMI 26.83 kg/m2  EXAM: General appearance: alert and no distress Neck: no carotid bruit and no JVD Lungs: clear to auscultation bilaterally Heart: regular rate and rhythm, S1, S2 normal, no murmur, click, rub or gallop Abdomen: soft, non-tender; bowel sounds normal; no masses,  no organomegaly Extremities: extremities normal, atraumatic, no cyanosis or edema Pulses: 2+ and symmetric Skin: Skin color, texture, turgor normal. No rashes or lesions Neurologic: Grossly normal Psych: Pleasant  EKG: Normal sinus rhythm at 75, occasional PVCs  ASSESSMENT: 1. Labile hypertension 2. Untreated dyslipidemia  PLAN: 1.   Tyler Gates has labile blood pressures. We made several adjustments to the medications. At this point I think the issue may be the duration of medications. He may be having lability due to incomplete daylong affect of the medicines. He may do better and may be able to make more adjustments in the medicines by having a non-combined medicine. I recommend breaking apart the Azor into amlodipine and Benicar. We'll give him amlodipine 2.5 mg twice daily and Benicar 20 mg twice daily. As needed he could take extra amlodipine or withhold the amlodipine dose if his blood pressure is either high or low respectively.  Plan to see him back in a month and we'll review his repeat blood pressures.   Tyler Casino, MD, [facility] Attending Cardiologist [place] 05/17/2015, 10:35 AM

## 2015-07-05 ENCOUNTER — Ambulatory Visit (INDEPENDENT_AMBULATORY_CARE_PROVIDER_SITE_OTHER): Payer: BLUE CROSS/BLUE SHIELD | Admitting: Internal Medicine

## 2015-07-05 ENCOUNTER — Encounter: Payer: Self-pay | Admitting: Internal Medicine

## 2015-07-05 VITALS — BP 122/80 | HR 76 | Ht 69.0 in | Wt 182.0 lb

## 2015-07-05 DIAGNOSIS — E785 Hyperlipidemia, unspecified: Secondary | ICD-10-CM

## 2015-07-05 DIAGNOSIS — I10 Essential (primary) hypertension: Secondary | ICD-10-CM

## 2015-07-05 MED ORDER — EZETIMIBE 10 MG PO TABS: 10.0000 mg | ORAL_TABLET | Freq: Every day | ORAL | Status: DC

## 2015-07-05 MED ORDER — OLMESARTAN MEDOXOMIL 20 MG PO TABS: 40.0000 mg | ORAL_TABLET | Freq: Every day | ORAL | Status: DC

## 2015-07-05 MED ORDER — AMLODIPINE BESYLATE 2.5 MG PO TABS: 2.5000 mg | ORAL_TABLET | Freq: Every day | ORAL | Status: DC

## 2015-07-05 NOTE — Progress Notes (Signed)
OFFICE NOTE  Chief Complaint:  Follow-up hypertension  Primary Care Physician: Purvis Kilts, MD  HPI:  Tyler Gates this pleasant 59 year old male that I last saw in 2014. He has a history of hypertension and marked dyslipidemia and has been intolerant to statins. He's an avid cyclist but does have a family history of heart disease which is significant and his father had coronary events at an early age. He underwent metabolic testing in December 2013 which showed an RER of 1.08, peak VO2 01 14% predicted which was excellent. At the time he had good control over his blood pressure however recently he's been having problems with low blood pressure. He says in the fall his blood pressure was elevated and he was switched from Exforge 10/320 mg daily to valsartan 320 mg daily. During bike rides an exercise he felt that he would get dizzy at times and thought that his blood pressure possibly was getting low. He did see some systolic blood pressures in the 90s. Subsequently he was changed over to Azor 5/40 mg daily. He says he's uncomfortable with this medicine and has some unusual side effects including dizziness. He unfortunately has not gotten treatment of his dyslipidemia which was remarkable. His total cholesterol was 266, with triglycerides 439, HDL 41 and LDL particle #[id number] in 2014. He denies any chest pain or worsening shortness of breath with exertion.  Tyler Gates returns today for follow-up. He reports some continued lability in his blood pressures. He felt that the valsartan was working fairly well but he still has some problems with dizziness and lower blood pressure. He saw Cyril Mourning, our pharmacist who made adjustments in his medications. He has since gone back to [name]. He feels like the worst time is when he takes the medicine and doesn't ride or bicycling within a couple hours of taking the medicine and he feels dizzy and her has low blood pressure. He then has had a few episodes were  his blood pressures been elevated. Once when he was on a trip to [place] and in the car for several hours. He was under a lot of stress and blood pressure got up to the 170s which he had a headache.  I saw Tyler Gates back in the office today. After some adjustments on his medicines last time he feels that he now has good control of his blood pressure. Today the office it was 122/80. He's currently taking a Benicar 40 mg daily and amlodipine 2.5 mg daily. He says that his blood pressures are not as labile as they used to be and he is not having any symptoms such as dizziness or fatigue.  PMHx:  Past Medical History  Diagnosis Date  . Hyperlipidemia   . Dyspnea on exertion   . Snoring   . Instability of knee joint   . Hypertension   . GERD (gastroesophageal reflux disease)   . Depression   . Crohn's disease     Past Surgical History  Procedure Laterality Date  . Colonoscopy  2007    Dr. Sabino Donovan normal TI, ascending colon/cecum with mild inflammation noted but biopses without signs of active inflammation, hyperpastic polyp, possible ischemia on path of left colon biopsy  . Esophagogastroduodenoscopy  2007    Dr. Sabino Donovan reflux esophagitis, hiatal hernia, negative H.pylori    FAMHx:  Family History  Problem Relation Age of Onset  . Colon cancer Neg Hx     SOCHx:   reports that he has never smoked. He  does not have any smokeless tobacco history on file. He reports that he drinks alcohol. He reports that he does not use illicit drugs.  ALLERGIES:  Allergies  Allergen Reactions  . Statins     REACTION: Myalgia    ROS: A comprehensive review of systems was negative.  HOME MEDS: Current Outpatient Prescriptions  Medication Sig Dispense Refill  . amLODipine (NORVASC) 2.5 MG tablet Take 1 tablet (2.5 mg total) by mouth every 12 (twelve) hours. 180 tablet 3  . ANDROGEL 50 MG/5GM (1%) GEL daily. Apply as directed every morning  2  . aspirin 81 MG tablet Take 81 mg by mouth daily.    Marland Kitchen  esomeprazole (NEXIUM) 40 MG capsule Take 40 mg by mouth daily before breakfast.    . ezetimibe (ZETIA) 10 MG tablet Take 1 tablet (10 mg total) by mouth daily. 90 tablet 1  . olmesartan (BENICAR) 20 MG tablet Take 1 tablet (20 mg total) by mouth daily. 90 tablet 1  . sulfaSALAzine (AZULFIDINE) 500 MG tablet Take 500 mg by mouth as needed (QID as needed.).    Marland Kitchen venlafaxine (EFFEXOR) 75 MG tablet Take 75 mg by mouth 2 (two) times daily.     No current facility-administered medications for this visit.    LABS/IMAGING: No results found for this or any previous visit (from the past 48 hour(s)). No results found.  VITALS: BP 122/80 mmHg  Pulse 76  Ht 5' 9"  (1.753 m)  Wt 182 lb (82.555 kg)  BMI 26.86 kg/m2  EXAM: deferred  EKG: deferred  ASSESSMENT: 1. Labile hypertension - at goal now 2. Untreated dyslipidemia  PLAN: 1.   Tyler Gates has labile blood pressures. This seems to be much better controlled on his current regimen which is Benicar 40 mg daily and amlodipine 2.5 mg daily. He would like to remain on brand name Benicar and we'll go ahead and change his prescriptions to 90 day supplies as he's recently had a change to [facility]. He also needs a refill on his LDL and we will continue to need to monitor his cholesterol closely. Plan to see him back now in 6 months.  Pixie Casino, MD, [facility] Attending Cardiologist [place] 07/05/2015, 3:29 PM

## 2015-07-05 NOTE — Patient Instructions (Signed)
Your physician recommends that you schedule a follow-up appointment in: 6 months with Dr. Debara Pickett  We have changed you dosages of you benicar and you schedule on your amlodipine

## 2015-07-12 ENCOUNTER — Telehealth: Payer: Self-pay

## 2015-07-12 ENCOUNTER — Telehealth: Payer: Self-pay | Admitting: Internal Medicine

## 2015-07-12 NOTE — Telephone Encounter (Signed)
PLEASE CALL PATIENT TO SCHEDULE 10 YR TCS  [phone]

## 2015-07-12 NOTE — Telephone Encounter (Signed)
LMOM to call.

## 2015-07-13 NOTE — Telephone Encounter (Signed)
Pt called back. The time and date is fine.  He is checking on his insurance and will call me next week.

## 2015-07-13 NOTE — Telephone Encounter (Signed)
I called and got triage info yesterday. He is scheduled for 08/08/2015 at 1:30 Pm with Dr.Rourk.  I called and left the time on his VM.

## 2015-07-17 ENCOUNTER — Other Ambulatory Visit: Payer: Self-pay

## 2015-07-17 DIAGNOSIS — Z1211 Encounter for screening for malignant neoplasm of colon: Secondary | ICD-10-CM

## 2015-07-20 NOTE — Telephone Encounter (Signed)
I called pt and he said it seems like he will not be able to have the procedure at [facility]. He just needs to check out a little further and will call me by next week sometime.

## 2015-07-26 NOTE — Telephone Encounter (Signed)
Pt's insurance will not cover colonoscopy at [facility]. Letter faxed to PCP.

## 2015-07-26 NOTE — Telephone Encounter (Signed)
LMOM for pt to call and confirm he still wants to have his procedure on 08/08/2015.

## 2015-07-26 NOTE — Telephone Encounter (Signed)
Hoyle Sauer in Endo is aware to cancel. Letter faxed to PCP.

## 2015-07-26 NOTE — Telephone Encounter (Signed)
PT called and said we will have to cancel the colonoscopy here. His insurance does not cover it here.

## 2015-08-08 ENCOUNTER — Ambulatory Visit: Admit: 2015-08-08 | Payer: Self-pay | Admitting: Internal Medicine

## 2015-08-08 SURGERY — COLONOSCOPY
Anesthesia: Moderate Sedation

## 2015-09-14 ENCOUNTER — Encounter: Payer: Self-pay | Admitting: Gastroenterology

## 2015-09-25 ENCOUNTER — Encounter: Payer: Self-pay | Admitting: Gastroenterology

## 2015-10-20 ENCOUNTER — Ambulatory Visit (INDEPENDENT_AMBULATORY_CARE_PROVIDER_SITE_OTHER): Payer: BLUE CROSS/BLUE SHIELD | Admitting: Gastroenterology

## 2015-10-20 ENCOUNTER — Encounter: Payer: Self-pay | Admitting: Gastroenterology

## 2015-10-20 ENCOUNTER — Other Ambulatory Visit: Payer: Self-pay

## 2015-10-20 VITALS — BP 132/84 | HR 76 | Temp 97.1°F | Ht 69.0 in | Wt 186.4 lb

## 2015-10-20 DIAGNOSIS — K50919 Crohn's disease, unspecified, with unspecified complications: Secondary | ICD-10-CM

## 2015-10-20 DIAGNOSIS — R1013 Epigastric pain: Secondary | ICD-10-CM | POA: Insufficient documentation

## 2015-10-20 DIAGNOSIS — K5090 Crohn's disease, unspecified, without complications: Secondary | ICD-10-CM | POA: Diagnosis not present

## 2015-10-20 DIAGNOSIS — K219 Gastro-esophageal reflux disease without esophagitis: Secondary | ICD-10-CM | POA: Diagnosis not present

## 2015-10-20 MED ORDER — ESOMEPRAZOLE MAGNESIUM 40 MG PO CPDR: 40.0000 mg | DELAYED_RELEASE_CAPSULE | Freq: Two times a day (BID) | ORAL | Status: DC

## 2015-10-20 MED ORDER — MESALAMINE ER 500 MG PO CPCR: 1000.0000 mg | ORAL_CAPSULE | Freq: Four times a day (QID) | ORAL | Status: DC

## 2015-10-20 NOTE — Assessment & Plan Note (Signed)
Chronic GERD with history of reflux esophagitis on prior endoscopy in 2007. No evidence of Barrett's at that time. Recent flare of symptoms in the setting of change in PPI therapy. Was switched from name branded Nexium to generic. Patient has had epigastric pain associated with this change. Increased generic Nexium to twice a day with some improvement. Requesting EGD for further evaluation which is reasonable given refractory symptoms and new onset epigastric pain.  I have discussed the risks, alternatives, benefits with regards to but not limited to the risk of reaction to medication, bleeding, infection, perforation and the patient is agreeable to proceed. Written consent to be obtained.

## 2015-10-20 NOTE — Progress Notes (Signed)
Primary Care Physician: Purvis Kilts, MD  Primary Gastroenterologist:  Garfield Cornea, MD   Chief Complaint  Patient presents with  . Crohn's Disease  . Gastroesophageal Reflux    HPI: Tyler Gates is a 59 y.o. male here for follow up of Crohn's colitis and GERD. 28 year h/o Crohn's disease diagnosed in [place]. Last seen in 01/2013. Was unable to have colonoscopy earlier this year because insurance issues. He has taken sulfasalazine intermittently for his "flares". Last TCS in 2007.  Continues to do well with exception of Crohn's flare every 1-2 years. Sulfasalazine now bothers stomach and he feels achy when he takes it. Feels horrible and wants to have alternative. Bothered stomach last couple of times. Had to take prednisone provided by PCP recently and now back in remission. He has had some UGI symptoms with refractory GERD and epigastric pain that began after he had to switch from Nexium to generic. Recently had to double his nexium to BID. Some mild improvement in symptoms. No vomiting or dysphagia. No weight loss. BM currently regular. No melena, brbpr.   Never on chronic medication for Crohn's.   Current Outpatient Prescriptions  Medication Sig Dispense Refill  . amLODipine (NORVASC) 2.5 MG tablet Take 1 tablet (2.5 mg total) by mouth daily. 90 tablet 3  . ANDROGEL 50 MG/5GM (1%) GEL daily. Apply as directed every morning  2  . aspirin 81 MG tablet Take 81 mg by mouth daily.    Marland Kitchen esomeprazole (NEXIUM) 40 MG capsule Take 40 mg by mouth daily before breakfast.    . ezetimibe (ZETIA) 10 MG tablet Take 1 tablet (10 mg total) by mouth daily. 90 tablet 3  . ibuprofen (ADVIL,MOTRIN) 200 MG tablet Take 200 mg by mouth every 6 (six) hours as needed.    Marland Kitchen olmesartan (BENICAR) 20 MG tablet Take 2 tablets (40 mg total) by mouth daily. 180 tablet 3  . venlafaxine (EFFEXOR) 75 MG tablet Take 75 mg by mouth 2 (two) times daily.    Marland Kitchen sulfaSALAzine (AZULFIDINE) 500 MG tablet Take  500 mg by mouth as needed (QID as needed.).     No current facility-administered medications for this visit.    Allergies as of 10/20/2015 - Review Complete 10/20/2015  Allergen Reaction Noted  . Statins  03/06/2007   Past Medical History  Diagnosis Date  . Hyperlipidemia   . Dyspnea on exertion   . Snoring   . Instability of knee joint   . Hypertension   . GERD (gastroesophageal reflux disease)   . Depression   . Crohn's disease [facility])    Past Surgical History  Procedure Laterality Date  . Colonoscopy  2007    Dr. Sabino Donovan normal TI, ascending colon/cecum with mild inflammation noted but biopses without signs of active inflammation, hyperpastic polyp, possible ischemia on path of left colon biopsy  . Esophagogastroduodenoscopy  2007    Dr. Sabino Donovan reflux esophagitis, hiatal hernia, negative H.pylori   Family History  Problem Relation Age of Onset  . Colon cancer Neg Hx   . Melanoma Father    Social History   Social History  . Marital Status: Married    Spouse Name: N/A  . Number of Children: N/A  . Years of Education: N/A   Social History Main Topics  . Smoking status: Never Smoker   . Smokeless tobacco: None  . Alcohol Use: Yes     Comment: glass a wine once a week  . Drug Use:  No  . Sexual Activity: Not Asked   Other Topics Concern  . None   Social History Narrative    ROS:  General: Negative for anorexia, weight loss, fever, chills, fatigue, weakness. ENT: Negative for hoarseness, difficulty swallowing , nasal congestion. CV: Negative for chest pain, angina, palpitations, dyspnea on exertion, peripheral edema.  Respiratory: Negative for dyspnea at rest, dyspnea on exertion, cough, sputum, wheezing.  GI: See history of present illness. GU:  Negative for dysuria, hematuria, urinary incontinence, urinary frequency, nocturnal urination.  Endo: Negative for unusual weight change.    Physical Examination:   BP 132/84 mmHg  Pulse 76  Temp(Src) 97.1 F (36.2 C)  (Oral)  Ht 5' 9"  (1.753 m)  Wt 186 lb 6.4 oz (84.55 kg)  BMI 27.51 kg/m2  General: Well-nourished, well-developed in no acute distress.  Eyes: No icterus. Mouth: Oropharyngeal mucosa moist and pink , no lesions erythema or exudate. Lungs: Clear to auscultation bilaterally.  Heart: Regular rate and rhythm, no murmurs rubs or gallops.  Abdomen: Bowel sounds are normal, nontender, nondistended, no hepatosplenomegaly or masses, no abdominal bruits or hernia , no rebound or guarding.   Extremities: No lower extremity edema. No clubbing or deformities. Neuro: Alert and oriented x 4   Skin: Warm and dry, no jaundice.   Psych: Alert and cooperative, normal mood and affect.  Labs:  Requested   Imaging Studies: No results found.

## 2015-10-20 NOTE — Assessment & Plan Note (Signed)
Crohn colitis, overdue for surveillance colonoscopy. Last colonoscopy in 2007. Has never taken daily medication for his Crohn's. Previously has used sulfasalazine when necessary which has worked well for him in the past. Recently with some abdominal pain associated with sulfasalazine, body aches and questions intolerance to medication.  Patient is agreeable to surveillance colonoscopy at this time. He has requested that we verify his benefits. I discussed this with her office [place] he would call the insurance company. Precertification center also do this as well.  I have discussed the risks, alternatives, benefits with regards to but not limited to the risk of reaction to medication, bleeding, infection, perforation and the patient is agreeable to proceed. Written consent to be obtained.  In the interim, Rx provided for Pentasa 1 g 4 times a day 4-8 weeks when necessary flare. Patient resist daily maintenance. Need to consider bone density study in the near future if patient agreeable. We have requested records of recent labs from PCP for review. Need to update his creatinine and LFTs at a minimal.

## 2015-10-20 NOTE — Patient Instructions (Signed)
1. Colonoscopy and upper endoscopy as scheduled. I have spoken with our office manager who will verify benefits next week. Benefits will also be verified by the hospital. 2. Increase generic Nexium to twice daily before breakfast and evening meal. [facility]. 3. Stop sulfasalazine. Start Pentasa (mesalamine). You may utilize during Crohn's flares. Take 2 (1000 mg) 4 times a day for 4-8 weeks with flares. Prescription sent [facility]. 4. I will request a copy of your labs from your PCP. If your kidney and liver function has not been updated recently we will need to have that done and will let you know.

## 2015-10-23 ENCOUNTER — Telehealth: Payer: Self-pay | Admitting: General Practice

## 2015-10-23 NOTE — Telephone Encounter (Signed)
I gave the patient the information listed belwo along with the authorization#, he thanked and stated he was going to call [place] himself and he will let us know if he wanted to cancel his procedure.

## 2015-10-23 NOTE — Telephone Encounter (Signed)
I called [employer] and spoke with Alejandra and she stated the patient has a PPO plan, so as long as we participate with our local [employer] we will be in network.    The patient would also have out of network benefits because it's a PPO plan.

## 2015-10-23 NOTE — Progress Notes (Signed)
cc'ed to pcp °

## 2015-10-23 NOTE — Telephone Encounter (Signed)
There is not a prior authorization needed for a tcs/egd, however the patient has a $700 individual deductible that has not been met.  After the deductible has been met, [employer] will cover 80% of his bill.   Ref# [id number]

## 2015-11-01 ENCOUNTER — Encounter: Payer: BLUE CROSS/BLUE SHIELD | Admitting: Gastroenterology

## 2015-11-07 ENCOUNTER — Telehealth: Payer: Self-pay

## 2015-11-07 ENCOUNTER — Telehealth: Payer: Self-pay | Admitting: Internal Medicine

## 2015-11-07 NOTE — Telephone Encounter (Signed)
I spoke with Dr. Gala Romney just to reaffirm that his procedures are as follows and he agrees. Discussed with Tyler Gates as well who agrees to call patient. This was previously discussed on multiple occasions since his ov, see documentation.  His EGD is diagnostic for refractory GERD and epigastric pain.  His TCS is not a screening exam, it is a surveillance exam for dysplasia in setting of chronic Crohn's colitis.

## 2015-11-07 NOTE — Telephone Encounter (Signed)
Patient called again adamant that he speak to lsl before closing today about the coding of his procedure

## 2015-11-07 NOTE — Telephone Encounter (Signed)
I spoke with the patient and made him aware that we are not able to change codes for his procedure.  He stated he understood and he will proceed with his procedure as planned.

## 2015-11-07 NOTE — Telephone Encounter (Addendum)
Pt called stating that he had received a call from the pre-service center. States that the pre-service center told him that his procedure would be diagnostic and not screening. States he would have a bill of $ 900.00 not covered. States that he needs to know why it is coded like this and needs it changed.   I informed the patient that we could not change how the procedure is coded. Pt was seen by Neil Crouch PA on 10/20/2015 and pt was agreeable on that date to a surveillance colonoscopy. I informed pt that according to the providers note the coding for the procedure is GERD for the EGD and Crohns for the Colonoscopy.  Pt was addiment about getting the code changed. States that he needs to talk to [place] about getting this changed.  LSL Please contact pt on home number [phone]

## 2015-11-08 ENCOUNTER — Encounter (HOSPITAL_COMMUNITY)
Admission: RE | Disposition: A | Discharge status: Home / Self Care | Payer: Self-pay | Source: Ambulatory Visit | Attending: Internal Medicine

## 2015-11-08 ENCOUNTER — Encounter (HOSPITAL_COMMUNITY): Payer: Self-pay | Admitting: *Deleted

## 2015-11-08 ENCOUNTER — Ambulatory Visit (HOSPITAL_COMMUNITY)
Admission: RE | Admit: 2015-11-08 | Discharge: 2015-11-08 | Disposition: A | Discharge status: Home / Self Care | Payer: BLUE CROSS/BLUE SHIELD | Source: Ambulatory Visit | Attending: Internal Medicine | Admitting: Internal Medicine

## 2015-11-08 DIAGNOSIS — R1013 Epigastric pain: Secondary | ICD-10-CM | POA: Diagnosis not present

## 2015-11-08 DIAGNOSIS — Z7982 Long term (current) use of aspirin: Secondary | ICD-10-CM | POA: Insufficient documentation

## 2015-11-08 DIAGNOSIS — K449 Diaphragmatic hernia without obstruction or gangrene: Secondary | ICD-10-CM | POA: Insufficient documentation

## 2015-11-08 DIAGNOSIS — K229 Disease of esophagus, unspecified: Secondary | ICD-10-CM | POA: Diagnosis not present

## 2015-11-08 DIAGNOSIS — F329 Major depressive disorder, single episode, unspecified: Secondary | ICD-10-CM | POA: Diagnosis not present

## 2015-11-08 DIAGNOSIS — E785 Hyperlipidemia, unspecified: Secondary | ICD-10-CM | POA: Diagnosis not present

## 2015-11-08 DIAGNOSIS — Z79899 Other long term (current) drug therapy: Secondary | ICD-10-CM | POA: Insufficient documentation

## 2015-11-08 DIAGNOSIS — K2950 Unspecified chronic gastritis without bleeding: Secondary | ICD-10-CM | POA: Diagnosis not present

## 2015-11-08 DIAGNOSIS — K50919 Crohn's disease, unspecified, with unspecified complications: Secondary | ICD-10-CM | POA: Diagnosis not present

## 2015-11-08 DIAGNOSIS — K5090 Crohn's disease, unspecified, without complications: Secondary | ICD-10-CM | POA: Diagnosis not present

## 2015-11-08 DIAGNOSIS — I10 Essential (primary) hypertension: Secondary | ICD-10-CM | POA: Insufficient documentation

## 2015-11-08 DIAGNOSIS — K219 Gastro-esophageal reflux disease without esophagitis: Secondary | ICD-10-CM

## 2015-11-08 DIAGNOSIS — K2289 Other specified disease of esophagus: Secondary | ICD-10-CM | POA: Insufficient documentation

## 2015-11-08 DIAGNOSIS — K5730 Diverticulosis of large intestine without perforation or abscess without bleeding: Secondary | ICD-10-CM | POA: Insufficient documentation

## 2015-11-08 HISTORY — PX: ESOPHAGOGASTRODUODENOSCOPY: SHX5428

## 2015-11-08 HISTORY — PX: COLONOSCOPY: SHX5424

## 2015-11-08 SURGERY — EGD (ESOPHAGOGASTRODUODENOSCOPY)
Anesthesia: Moderate Sedation

## 2015-11-08 MED ORDER — STERILE WATER FOR IRRIGATION IR SOLN
Status: DC | PRN
  Administered 2015-11-08: 2.5 mL

## 2015-11-08 MED ORDER — MEPERIDINE HCL 100 MG/ML IJ SOLN
INTRAMUSCULAR | Status: DC | PRN
  Administered 2015-11-08 (×2): 50 mg via INTRAVENOUS

## 2015-11-08 MED ORDER — MIDAZOLAM HCL 5 MG/5ML IJ SOLN
INTRAMUSCULAR | Status: AC
  Filled 2015-11-08: qty 10

## 2015-11-08 MED ORDER — LIDOCAINE VISCOUS 2 % MT SOLN
OROMUCOSAL | Status: DC | PRN
  Administered 2015-11-08: 1 via OROMUCOSAL

## 2015-11-08 MED ORDER — MEPERIDINE HCL 100 MG/ML IJ SOLN
INTRAMUSCULAR | Status: AC
  Filled 2015-11-08: qty 2

## 2015-11-08 MED ORDER — ONDANSETRON HCL 4 MG/2ML IJ SOLN
INTRAMUSCULAR | Status: AC
  Filled 2015-11-08: qty 2

## 2015-11-08 MED ORDER — MIDAZOLAM HCL 5 MG/5ML IJ SOLN
INTRAMUSCULAR | Status: DC | PRN
  Administered 2015-11-08: 2 mg via INTRAVENOUS
  Administered 2015-11-08 (×2): 1 mg via INTRAVENOUS
  Administered 2015-11-08: 2 mg via INTRAVENOUS
  Administered 2015-11-08: 1 mg via INTRAVENOUS

## 2015-11-08 MED ORDER — LIDOCAINE VISCOUS 2 % MT SOLN
OROMUCOSAL | Status: AC
  Filled 2015-11-08: qty 15

## 2015-11-08 MED ORDER — SODIUM CHLORIDE 0.9 % IV SOLN
INTRAVENOUS | Status: DC
  Administered 2015-11-08 (×2): via INTRAVENOUS

## 2015-11-08 MED ORDER — ONDANSETRON HCL 4 MG/2ML IJ SOLN
INTRAMUSCULAR | Status: DC | PRN
  Administered 2015-11-08: 4 mg via INTRAVENOUS

## 2015-11-08 NOTE — Progress Notes (Signed)
Patient given instructions with web site and code to sign up for "My Chart"

## 2015-11-08 NOTE — Discharge Instructions (Addendum)
Colonoscopy Discharge Instructions  Read the instructions outlined below and refer to this sheet in the next few weeks. These discharge instructions provide you with general information on caring for yourself after you leave the hospital. Your doctor may also give you specific instructions. While your treatment has been planned according to the most current medical practices available, unavoidable complications occasionally occur. If you have any problems or questions after discharge, call Dr. Gala Romney at [phone]. ACTIVITY  You may resume your regular activity, but move at a slower pace for the next 24 hours.   Take frequent rest periods for the next 24 hours.   Walking will help get rid of the air and reduce the bloated feeling in your belly (abdomen).   No driving for 24 hours (because of the medicine (anesthesia) used during the test).    Do not sign any important legal documents or operate any machinery for 24 hours (because of the anesthesia used during the test).  NUTRITION  Drink plenty of fluids.   You may resume your normal diet as instructed by your doctor.   Begin with a light meal and progress to your normal diet. Heavy or fried foods are harder to digest and may make you feel sick to your stomach (nauseated).   Avoid alcoholic beverages for 24 hours or as instructed.  MEDICATIONS  You may resume your normal medications unless your doctor tells you otherwise.  WHAT YOU CAN EXPECT TODAY  Some feelings of bloating in the abdomen.   Passage of more gas than usual.   Spotting of blood in your stool or on the toilet paper.  IF YOU HAD POLYPS REMOVED DURING THE COLONOSCOPY:  No aspirin products for 7 days or as instructed.   No alcohol for 7 days or as instructed.   Eat a soft diet for the next 24 hours.  FINDING OUT THE RESULTS OF YOUR TEST Not all test results are available during your visit. If your test results are not back during the visit, make an appointment  with your caregiver to find out the results. Do not assume everything is normal if you have not heard from your caregiver or the medical facility. It is important for you to follow up on all of your test results.  SEEK IMMEDIATE MEDICAL ATTENTION IF:  You have more than a spotting of blood in your stool.   Your belly is swollen (abdominal distention).   You are nauseated or vomiting.   You have a temperature over 101.   You have abdominal pain or discomfort that is severe or gets worse throughout the day.  EGD Discharge instructions Please read the instructions outlined below and refer to this sheet in the next few weeks. These discharge instructions provide you with general information on caring for yourself after you leave the hospital. Your doctor may also give you specific instructions. While your treatment has been planned according to the most current medical practices available, unavoidable complications occasionally occur. If you have any problems or questions after discharge, please call your doctor. ACTIVITY You may resume your regular activity but move at a slower pace for the next 24 hours.  Take frequent rest periods for the next 24 hours.  Walking will help expel (get rid of) the air and reduce the bloated feeling in your abdomen.  No driving for 24 hours (because of the anesthesia (medicine) used during the test).  You may shower.  Do not sign any important legal documents or operate any machinery  for 24 hours (because of the anesthesia used during the test).  NUTRITION Drink plenty of fluids.  You may resume your normal diet.  Begin with a light meal and progress to your normal diet.  Avoid alcoholic beverages for 24 hours or as instructed by your caregiver.  MEDICATIONS You may resume your normal medications unless your caregiver tells you otherwise.  WHAT YOU CAN EXPECT TODAY You may experience abdominal discomfort such as a feeling of fullness or gas pains.    FOLLOW-UP Your doctor will discuss the results of your test with you.  SEEK IMMEDIATE MEDICAL ATTENTION IF ANY OF THE FOLLOWING OCCUR: Excessive nausea (feeling sick to your stomach) and/or vomiting.  Severe abdominal pain and distention (swelling).  Trouble swallowing.  Temperature over 101 F (37.8 C).  Rectal bleeding or vomiting of blood.     GERD information provided  Stop Nexium for now; begin Dexilant 60 mg daily-go by my office for a 3 week supply of samples  Further recommendations to follow pending review of pathology report   Gastroesophageal Reflux Disease, Adult Normally, food travels down the esophagus and stays in the stomach to be digested. However, when a person has gastroesophageal reflux disease (GERD), food and stomach acid move back up into the esophagus. When this happens, the esophagus becomes sore and inflamed. Over time, GERD can create small holes (ulcers) in the lining of the esophagus.  CAUSES This condition is caused by a problem with the muscle between the esophagus and the stomach (lower esophageal sphincter, or LES). Normally, the LES muscle closes after food passes through the esophagus to the stomach. When the LES is weakened or abnormal, it does not close properly, and that allows food and stomach acid to go back up into the esophagus. The LES can be weakened by certain dietary substances, medicines, and medical conditions, including:  Tobacco use.  Pregnancy.  Having a hiatal hernia.  Heavy alcohol use.  Certain foods and beverages, such as coffee, chocolate, onions, and peppermint. RISK FACTORS This condition is more likely to develop in:  People who have an increased body weight.  People who have connective tissue disorders.  People who use NSAID medicines. SYMPTOMS Symptoms of this condition include:  Heartburn.  Difficult or painful swallowing.  The feeling of having a lump in the throat.  Abitter taste in the mouth.  Bad  breath.  Having a large amount of saliva.  Having an upset or bloated stomach.  Belching.  Chest pain.  Shortness of breath or wheezing.  Ongoing (chronic) cough or a night-time cough.  Wearing away of tooth enamel.  Weight loss. Different conditions can cause chest pain. Make sure to see your health care provider if you experience chest pain. DIAGNOSIS Your health care provider will take a medical history and perform a physical exam. To determine if you have mild or severe GERD, your health care provider may also monitor how you respond to treatment. You may also have other tests, including:  An endoscopy toexamine your stomach and esophagus with a small camera.  A test thatmeasures the acidity level in your esophagus.  A test thatmeasures how much pressure is on your esophagus.  A barium swallow or modified barium swallow to show the shape, size, and functioning of your esophagus. TREATMENT The goal of treatment is to help relieve your symptoms and to prevent complications. Treatment for this condition may vary depending on how severe your symptoms are. Your health care provider may recommend:  Changes to  your diet.  Medicine.  Surgery. HOME CARE INSTRUCTIONS Diet  Follow a diet as recommended by your health care provider. This may involve avoiding foods and drinks such as:  Coffee and tea (with or without caffeine).  Drinks that containalcohol.  Energy drinks and sports drinks.  Carbonated drinks or sodas.  Chocolate and cocoa.  Peppermint and mint flavorings.  Garlic and onions.  Horseradish.  Spicy and acidic foods, including peppers, chili powder, curry powder, vinegar, hot sauces, and barbecue sauce.  Citrus fruit juices and citrus fruits, such as oranges, lemons, and limes.  Tomato-based foods, such as red sauce, chili, salsa, and pizza with red sauce.  Fried and fatty foods, such as donuts, french fries, potato chips, and high-fat  dressings.  High-fat meats, such as hot dogs and fatty cuts of red and white meats, such as rib eye steak, sausage, ham, and bacon.  High-fat dairy items, such as whole milk, butter, and cream cheese.  Eat small, frequent meals instead of large meals.  Avoid drinking large amounts of liquid with your meals.  Avoid eating meals during the 2-3 hours before bedtime.  Avoid lying down right after you eat.  Do not exercise right after you eat. General Instructions  Pay attention to any changes in your symptoms.  Take over-the-counter and prescription medicines only as told by your health care provider. Do not take aspirin, ibuprofen, or other NSAIDs unless your health care provider told you to do so.  Do not use any tobacco products, including cigarettes, chewing tobacco, and e-cigarettes. If you need help quitting, ask your health care provider.  Wear loose-fitting clothing. Do not wear anything tight around your waist that causes pressure on your abdomen.  Raise (elevate) the head of your bed 6 inches (15cm).  Try to reduce your stress, such as with yoga or meditation. If you need help reducing stress, ask your health care provider.  If you are overweight, reduce your weight to an amount that is healthy for you. Ask your health care provider for guidance about a safe weight loss goal.  Keep all follow-up visits as told by your health care provider. This is important. SEEK MEDICAL CARE IF:  You have new symptoms.  You have unexplained weight loss.  You have difficulty swallowing, or it hurts to swallow.  You have wheezing or a persistent cough.  Your symptoms do not improve with treatment.  You have a hoarse voice. SEEK IMMEDIATE MEDICAL CARE IF:  You have pain in your arms, neck, jaw, teeth, or back.  You feel sweaty, dizzy, or light-headed.  You have chest pain or shortness of breath.  You vomit and your vomit looks like blood or coffee grounds.  You  faint.  Your stool is bloody or black.  You cannot swallow, drink, or eat.   This information is not intended to replace advice given to you by your health care provider. Make sure you discuss any questions you have with your health care provider.   Document Released: 09/18/2005 Document Revised: 08/30/2015 Document Reviewed: 04/05/2015 Elsevier Interactive Patient Education [employer].

## 2015-11-08 NOTE — Interval H&P Note (Signed)
History and Physical Interval Note:  11/08/2015 8:41 AM  Tyler Gates  has presented today for surgery, with the diagnosis of GERD, epigastric pain, crohns disease  The various methods of treatment have been discussed with the patient and family. After consideration of risks, benefits and other options for treatment, the patient has consented to  Procedure(s) with comments: ESOPHAGOGASTRODUODENOSCOPY (EGD) (N/A) - 0830 - moved to 8:15 - office to notify COLONOSCOPY (N/A) as a surgical intervention .  The patient's history has been reviewed, patient examined, no change in status, stable for surgery.  I have reviewed the patient's chart and labs.  Questions were answered to the patient's satisfaction.     Tyler Gates  No change. No dysphagia. No diarrhea. Diagnostic EGD and surveillance colonoscopy per plan.  The risks, benefits, limitations, imponderables and alternatives regarding both EGD and colonoscopy have been reviewed with the patient. Questions have been answered. All parties agreeable.

## 2015-11-08 NOTE — H&P (View-Only) (Signed)
Primary Care Physician: Purvis Kilts, MD  Primary Gastroenterologist:  Garfield Cornea, MD   Chief Complaint  Patient presents with  . Crohn's Disease  . Gastroesophageal Reflux    HPI: Tyler Gates is a 59 y.o. male here for follow up of Crohn's colitis and GERD. 41 year h/o Crohn's disease diagnosed in [place]. Last seen in 01/2013. Was unable to have colonoscopy earlier this year because insurance issues. He has taken sulfasalazine intermittently for his "flares". Last TCS in 2007.  Continues to do well with exception of Crohn's flare every 1-2 years. Sulfasalazine now bothers stomach and he feels achy when he takes it. Feels horrible and wants to have alternative. Bothered stomach last couple of times. Had to take prednisone provided by PCP recently and now back in remission. He has had some UGI symptoms with refractory GERD and epigastric pain that began after he had to switch from Nexium to generic. Recently had to double his nexium to BID. Some mild improvement in symptoms. No vomiting or dysphagia. No weight loss. BM currently regular. No melena, brbpr.   Never on chronic medication for Crohn's.   Current Outpatient Prescriptions  Medication Sig Dispense Refill  . amLODipine (NORVASC) 2.5 MG tablet Take 1 tablet (2.5 mg total) by mouth daily. 90 tablet 3  . ANDROGEL 50 MG/5GM (1%) GEL daily. Apply as directed every morning  2  . aspirin 81 MG tablet Take 81 mg by mouth daily.    Marland Kitchen esomeprazole (NEXIUM) 40 MG capsule Take 40 mg by mouth daily before breakfast.    . ezetimibe (ZETIA) 10 MG tablet Take 1 tablet (10 mg total) by mouth daily. 90 tablet 3  . ibuprofen (ADVIL,MOTRIN) 200 MG tablet Take 200 mg by mouth every 6 (six) hours as needed.    Marland Kitchen olmesartan (BENICAR) 20 MG tablet Take 2 tablets (40 mg total) by mouth daily. 180 tablet 3  . venlafaxine (EFFEXOR) 75 MG tablet Take 75 mg by mouth 2 (two) times daily.    Marland Kitchen sulfaSALAzine (AZULFIDINE) 500 MG tablet Take  500 mg by mouth as needed (QID as needed.).     No current facility-administered medications for this visit.    Allergies as of 10/20/2015 - Review Complete 10/20/2015  Allergen Reaction Noted  . Statins  03/06/2007   Past Medical History  Diagnosis Date  . Hyperlipidemia   . Dyspnea on exertion   . Snoring   . Instability of knee joint   . Hypertension   . GERD (gastroesophageal reflux disease)   . Depression   . Crohn's disease [facility])    Past Surgical History  Procedure Laterality Date  . Colonoscopy  2007    Dr. Sabino Donovan normal TI, ascending colon/cecum with mild inflammation noted but biopses without signs of active inflammation, hyperpastic polyp, possible ischemia on path of left colon biopsy  . Esophagogastroduodenoscopy  2007    Dr. Sabino Donovan reflux esophagitis, hiatal hernia, negative H.pylori   Family History  Problem Relation Age of Onset  . Colon cancer Neg Hx   . Melanoma Father    Social History   Social History  . Marital Status: Married    Spouse Name: N/A  . Number of Children: N/A  . Years of Education: N/A   Social History Main Topics  . Smoking status: Never Smoker   . Smokeless tobacco: None  . Alcohol Use: Yes     Comment: glass a wine once a week  . Drug Use:  No  . Sexual Activity: Not Asked   Other Topics Concern  . None   Social History Narrative    ROS:  General: Negative for anorexia, weight loss, fever, chills, fatigue, weakness. ENT: Negative for hoarseness, difficulty swallowing , nasal congestion. CV: Negative for chest pain, angina, palpitations, dyspnea on exertion, peripheral edema.  Respiratory: Negative for dyspnea at rest, dyspnea on exertion, cough, sputum, wheezing.  GI: See history of present illness. GU:  Negative for dysuria, hematuria, urinary incontinence, urinary frequency, nocturnal urination.  Endo: Negative for unusual weight change.    Physical Examination:   BP 132/84 mmHg  Pulse 76  Temp(Src) 97.1 F (36.2 C)  (Oral)  Ht 5' 9"  (1.753 m)  Wt 186 lb 6.4 oz (84.55 kg)  BMI 27.51 kg/m2  General: Well-nourished, well-developed in no acute distress.  Eyes: No icterus. Mouth: Oropharyngeal mucosa moist and pink , no lesions erythema or exudate. Lungs: Clear to auscultation bilaterally.  Heart: Regular rate and rhythm, no murmurs rubs or gallops.  Abdomen: Bowel sounds are normal, nontender, nondistended, no hepatosplenomegaly or masses, no abdominal bruits or hernia , no rebound or guarding.   Extremities: No lower extremity edema. No clubbing or deformities. Neuro: Alert and oriented x 4   Skin: Warm and dry, no jaundice.   Psych: Alert and cooperative, normal mood and affect.  Labs:  Requested   Imaging Studies: No results found.

## 2015-11-08 NOTE — Op Note (Signed)
[facility] [address] [place], [address]   COLONOSCOPY PROCEDURE REPORT  PATIENT: Tyler, Gates  MR#: [id number] BIRTHDATE: February 18, 1956 , 23  yrs. old GENDER: male ENDOSCOPIST: R.  Garfield Cornea, MD FACP [facility] REFERRED CX:KGYJ Hilma Favors, M.D. PROCEDURE DATE:  Nov 29, 2015 PROCEDURE:   Ileo-Colonoscopy with biopsy INDICATIONS:Long-standing Crohn's colitis; surveillance examination. MEDICATIONS: Versed 7 mg IV and Demerol 100 mg IV in divided doses. Zofran 4 mg IV.  Zofran 4 mg IV. ASA CLASS:       Class II  CONSENT: The risks, benefits, alternatives and imponderables including but not limited to bleeding, perforation as well as the possibility of a missed lesion have been reviewed.  The potential for biopsy, lesion removal, etc. have also been discussed. Questions have been answered.  All parties agreeable.  Please see the history and physical in the medical record for more information.  DESCRIPTION OF PROCEDURE:   After the risks benefits and alternatives of the procedure were thoroughly explained, informed consent was obtained.  The digital rectal exam revealed no abnormalities of the rectum.   The [id number] ([id number])  endoscope was introduced through the anus and advanced to the terminal ileum which was intubated for a short distance. No adverse events experienced.   The quality of the prep was adequate  The instrument was then slowly withdrawn as the colon was fully examined. Estimated blood loss is zero unless otherwise noted in this procedure report.      COLON FINDINGS: Normal-appearing rectal mucosa.  Scattered left-sided diverticula.  Very subtle neovascular changes of the ascending segment and some erythema diffusely of the cecum.  No erosion, ulceration or cobblestoning seen.  No polyp or other abnormality.  The distal 15 cm of terminal ileum mucosa appeared entirely normal.  Biopsies of the  cecum/ascending/transverse/descending/sigmoid/rectal segments taken for histologic study.  Retroflexion was performed. .   Withdrawal time=11 minutes 0 seconds.  The scope was withdrawn and the procedure completed. COMPLICATIONS: There were no immediate complications.  ENDOSCOPIC IMPRESSION: Subtle mucosal changes of the cecal and ascending segment; otherwise, normal appearing colon (left-sided diverticulosis)   RECOMMENDATIONS: Continue off label use of Pentasa for now. Follow up on pathology. See EGD report.  eSigned:  R. Garfield Cornea, MD Rosalita Chessman [facility] 11-29-2015 9:41 AM   cc:  CPT CODES: ICD CODES:  The ICD and CPT codes recommended by this software are interpretations from the data that the clinical staff has captured with the software.  The verification of the translation of this report to the ICD and CPT codes and modifiers is the sole responsibility of the health care institution and practicing physician where this report was generated.  [place]. will not be held responsible for the validity of the ICD and CPT codes included on this report.  AMA assumes no liability for data contained or not contained herein. CPT is a Designer, television/film set of the [employer].  PATIENT NAME:  Tyler Gates, Tyler Gates MR#: [id number]

## 2015-11-08 NOTE — Op Note (Signed)
[facility] [address] [place], [address]   ENDOSCOPY PROCEDURE REPORT  PATIENT: Tyler Gates, Tyler Gates  MR#: [id number] BIRTHDATE: Dec 14, 1956 , 36  yrs. old GENDER: male ENDOSCOPIST: R.  Garfield Cornea, MD FACP FACG REFERRED BY:  Sharilyn Sites, M.D. PROCEDURE DATE:  12-03-15 PROCEDURE:  EGD w/ biopsy INDICATIONS:  GERD; epigastric pain. MEDICATIONS: Versed 5 mg IV and Demerol 100 mg IV in divided doses. Xylocaine gel orally.  Zofran 4 mg IV. ASA CLASS:      Class II  CONSENT: The risks, benefits, limitations, alternatives and imponderables have been discussed.  The potential for biopsy, esophogeal dilation, etc. have also been reviewed.  Questions have been answered.  All parties agreeable.  Please see the history and physical in the medical record for more information.  DESCRIPTION OF PROCEDURE: After the risks benefits and alternatives of the procedure were thoroughly explained, informed consent was obtained.  The [id number] ([id number]) endoscope was introduced through the mouth and advanced to the second portion of the duodenum , limited by Without limitations. The instrument was slowly withdrawn as the mucosa was fully examined. Estimated blood loss is zero unless otherwise noted in this procedure report.    (2) "tongues" of salmon-colored epithelium coming up about 1 cm above the GE junction.  No esophagitis.  Patulous EG junction. Stomach empty.  2-3 cm hiatal hernia present.  Normal gastric mucosa.  Patent pylorus.  Normal-appearing first and second portion of the duodenum.  Biopsies of the abnormal appearing distal esophagus taken for histologic study.  Retroflexed views revealed a hiatal hernia. The scope was then withdrawn from the patient and the procedure completed.  COMPLICATIONS: There were no immediate complications.  ENDOSCOPIC IMPRESSION: Abnormal distal esophagus?"query short segment Barrett's?"status post biopsy. Patulous EG junction.  Hiatal hernia. Suspect patient is having reflux to account for the majority of his upper GI tract symptoms.  RECOMMENDATIONS: Stop OTC esoomeprazole; trial of Dexilant 60 mg daily?"patient to go by my office for a supply free samples.  Follow-up on pathology. See colonoscopy report.  REPEAT EXAM:  eSigned:  R. Garfield Cornea, MD Rosalita Chessman [facility] 12/03/2015 9:11 AM    CC:  CPT CODES: ICD CODES:  The ICD and CPT codes recommended by this software are interpretations from the data that the clinical staff has captured with the software.  The verification of the translation of this report to the ICD and CPT codes and modifiers is the sole responsibility of the health care institution and practicing physician where this report was generated.  [place]. will not be held responsible for the validity of the ICD and CPT codes included on this report.  AMA assumes no liability for data contained or not contained herein. CPT is a Designer, television/film set of the [employer].  PATIENT NAME:  [name], [name] MR#: [id number]

## 2015-11-09 ENCOUNTER — Telehealth: Payer: Self-pay | Admitting: Internal Medicine

## 2015-11-09 NOTE — Telephone Encounter (Signed)
Noted  

## 2015-11-09 NOTE — Telephone Encounter (Signed)
Patient called this afternoon to say he had a procedure yesterday by RMR and wanted to say "Thank You" and he appreciates everything you have done.

## 2015-11-14 ENCOUNTER — Encounter: Payer: Self-pay | Admitting: Internal Medicine

## 2015-11-14 ENCOUNTER — Encounter (HOSPITAL_COMMUNITY): Payer: Self-pay | Admitting: Internal Medicine

## 2015-11-15 ENCOUNTER — Encounter: Payer: Self-pay | Admitting: Internal Medicine

## 2015-11-27 ENCOUNTER — Ambulatory Visit: Payer: BLUE CROSS/BLUE SHIELD | Admitting: Gastroenterology

## 2015-11-30 NOTE — Progress Notes (Signed)
Reviewed outside labs. 04/2014, normal lfts and creatinine.  Upcoming appointment planned in 01/2016.  Will update LFTs, creatinine at that time AND offer baseline bone density study if he hasn't had one.

## 2016-01-01 ENCOUNTER — Encounter: Payer: Self-pay | Admitting: Internal Medicine

## 2016-01-01 ENCOUNTER — Ambulatory Visit (INDEPENDENT_AMBULATORY_CARE_PROVIDER_SITE_OTHER): Payer: BLUE CROSS/BLUE SHIELD | Admitting: Internal Medicine

## 2016-01-01 VITALS — BP 129/92 | HR 74 | Ht 69.0 in | Wt 188.1 lb

## 2016-01-01 DIAGNOSIS — E785 Hyperlipidemia, unspecified: Secondary | ICD-10-CM

## 2016-01-01 DIAGNOSIS — I10 Essential (primary) hypertension: Secondary | ICD-10-CM | POA: Diagnosis not present

## 2016-01-01 DIAGNOSIS — G4730 Sleep apnea, unspecified: Secondary | ICD-10-CM | POA: Diagnosis not present

## 2016-01-01 DIAGNOSIS — I4510 Unspecified right bundle-branch block: Secondary | ICD-10-CM | POA: Diagnosis not present

## 2016-01-01 MED ORDER — EZETIMIBE 10 MG PO TABS
10.0000 mg | ORAL_TABLET | Freq: Every day | ORAL | Status: DC
Start: 2016-01-01 — End: 2017-01-28

## 2016-01-01 NOTE — Patient Instructions (Signed)
Your physician has recommended you make the following change in your medication: restart Zetia 10 mg daily   Your physician recommends that you return for lab work in: 4-6 weeks after restarting Zetia 10 mg daily  Your physician recommends that you schedule a follow-up appointment in: 6 months with Dr. Debara Pickett

## 2016-01-01 NOTE — Progress Notes (Signed)
OFFICE NOTE  Chief Complaint: Routine follow-up, no complaints  Primary Care Physician: Purvis Kilts, MD  HPI:  Tyler Gates this pleasant 60 year old male that I last saw in 2014. He has a history of hypertension and marked dyslipidemia and has been intolerant to statins. He's an avid cyclist but does have a family history of heart disease which is significant and his father had coronary events at an early age. He underwent metabolic testing in December 2013 which showed an RER of 1.08, peak VO2 01 14% predicted which was excellent. At the time he had good control over his blood pressure however recently he's been having problems with low blood pressure. He says in the fall his blood pressure was elevated and he was switched from Exforge 10/320 mg daily to valsartan 320 mg daily. During bike rides an exercise he felt that he would get dizzy at times and thought that his blood pressure possibly was getting low. He did see some systolic blood pressures in the 90s. Subsequently he was changed over to Azor 5/40 mg daily. He says he's uncomfortable with this medicine and has some unusual side effects including dizziness. He unfortunately has not gotten treatment of his dyslipidemia which was remarkable. His total cholesterol was 266, with triglycerides 439, HDL 41 and LDL particle #[id number] in 2014. He denies any chest pain or worsening shortness of breath with exertion.  Tyler Gates returns today for follow-up. He reports some continued lability in his blood pressures. He felt that the valsartan was working fairly well but he still has some problems with dizziness and lower blood pressure. He saw Cyril Mourning, our pharmacist who made adjustments in his medications. He has since gone back to [name]. He feels like the worst time is when he takes the medicine and doesn't ride or bicycling within a couple hours of taking the medicine and he feels dizzy and her has low blood pressure. He then has had a few  episodes were his blood pressures been elevated. Once when he was on a trip to [place] and in the car for several hours. He was under a lot of stress and blood pressure got up to the 170s which he had a headache.  I saw [name] back in the office today. After some adjustments on his medicines last time he feels that he now has good control of his blood pressure. Today the office it was 122/80. He's currently taking a Benicar 40 mg daily and amlodipine 2.5 mg daily. He says that his blood pressures are not as labile as they used to be and he is not having any symptoms such as dizziness or fatigue.  He'll returns today for follow-up. Overall he is feeling well. He says his blood pressure is running a little bit higher but he's been on of his Benicar for a few days. He denies any chest pain or shortness of breath. He reports he stopped taking Zetia due to cost issues over the past year and has been out of it for several months. He was due for recheck lipid profile today. A routine EKG was performed in the office which shows a new right bundle branch block. This was not present on his prior EKG in May 2016. He tells me however he is asymptomatic and in fact has gone on to very long bike rides over the past several weeks without any change in exercise tolerance.  PMHx:  Past Medical History  Diagnosis Date  . Hyperlipidemia   .  Dyspnea on exertion   . Snoring   . Instability of knee joint   . Hypertension   . GERD (gastroesophageal reflux disease)   . Depression   . Crohn's disease [facility])     Past Surgical History  Procedure Laterality Date  . Colonoscopy  2007    Dr. Sabino Donovan normal TI, ascending colon/cecum with mild inflammation noted but biopses without signs of active inflammation, hyperpastic polyp, possible ischemia on path of left colon biopsy  . Esophagogastroduodenoscopy  2007    Dr. Sabino Donovan reflux esophagitis, hiatal hernia, negative H.pylori  . Esophagogastroduodenoscopy N/A 11/08/2015     Procedure: ESOPHAGOGASTRODUODENOSCOPY (EGD);  Surgeon: [name], MD;  [facility];  Service: Endoscopy;  Laterality: N/A;  0830 - moved to 8:15 - office to notify  . Colonoscopy N/A 11/08/2015    Procedure: COLONOSCOPY;  Surgeon: [name], MD;  Location: [facility];  Service: Endoscopy;  Laterality: N/A;    FAMHx:  Family History  Problem Relation Age of Onset  . Colon cancer Neg Hx   . Melanoma Father     SOCHx:   reports that he has never smoked. He does not have any smokeless tobacco history on file. He reports that he drinks alcohol. He reports that he does not use illicit drugs.  ALLERGIES:  Allergies  Allergen Reactions  . Statins     REACTION: Myalgia    ROS: A comprehensive review of systems was negative.  HOME MEDS: Current Outpatient Prescriptions  Medication Sig Dispense Refill  . amLODipine (NORVASC) 2.5 MG tablet Take 1 tablet by mouth 2 (two) times daily. Take 1 tab bid    . ANDROGEL 50 MG/5GM (1%) GEL daily. Apply as directed every morning  2  . aspirin 81 MG tablet Take 81 mg by mouth daily.    Marland Kitchen esomeprazole (NEXIUM) 40 MG capsule Take 1 capsule by mouth 2 (two) times daily. Take 1 tab daily    . ibuprofen (ADVIL,MOTRIN) 200 MG tablet Take 200 mg by mouth every 6 (six) hours as needed.    . mesalamine (PENTASA) 500 MG CR capsule Take 2 capsules (1,000 mg total) by mouth 4 (four) times daily. Take for 4-8 weeks with flares as discussed. 720 capsule 3  . olmesartan (BENICAR) 20 MG tablet Take 2 tablets (40 mg total) by mouth daily. (Patient taking differently: Take 20 mg by mouth daily. ) 180 tablet 3  . venlafaxine (EFFEXOR) 37.5 MG tablet Take 1 tablet by mouth 2 (two) times daily. Take 1 tab twice a day    . ezetimibe (ZETIA) 10 MG tablet Take 1 tablet (10 mg total) by mouth daily. 90 tablet 3   No current facility-administered medications for this visit.    LABS/IMAGING: No results found for this or any previous visit (from the  past 48 hour(s)). No results found.  VITALS: BP 129/92 mmHg  Pulse 74  Ht 5' 9"  (1.753 m)  Wt 188 lb 1 oz (85.305 kg)  BMI 27.76 kg/m2  EXAM: General appearance: alert and no distress Neck: no carotid bruit and no JVD Lungs: clear to auscultation bilaterally Heart: regular rate and rhythm, S1, S2 normal, no murmur, click, rub or gallop Abdomen: soft, non-tender; bowel sounds normal; no masses,  no organomegaly Extremities: extremities normal, atraumatic, no cyanosis or edema Pulses: 2+ and symmetric Skin: Skin color, texture, turgor normal. No rashes or lesions Neurologic: Grossly normal Psych: Pleasant  EKG: Normal sinus rhythm with sinus arrhythmia at 74, RBBB  ASSESSMENT: 1. Labile hypertension - at goal now 2. Untreated dyslipidemia 3. New RBBB, asymptomatic  PLAN: 1.   Bill reports his blood pressures been under better control. During the winter he is found that he's needed to take amlodipine 2.5 mg twice daily, although during the summer he feels that is too much medication and only takes it once daily. He recently ran out of the Benicar a few days ago but blood pressure has been well controlled. I had recommended starting him on that ear for cholesterol. He reported intolerance to statins in the past. He did not take his any over the past several months due to cost issues. We advised him that is generic today. We will renew his prescription and I like to recheck his cholesterol profile in 4-6 weeks after restarting the Zetia. He does have a new right bundle branch block today on EKG. This could be related to sleep apnea which she carries that diagnosis. I do not think he needs any further ischemic testing as he is asymptomatic and does significant exercise. Plan to see him back in 6 months.  Pixie Casino, MD, [facility] Attending Cardiologist [place] H[place] 01/01/2016, 2:30 PM

## 2016-02-15 ENCOUNTER — Ambulatory Visit: Payer: BLUE CROSS/BLUE SHIELD | Admitting: Gastroenterology

## 2016-05-14 ENCOUNTER — Ambulatory Visit (INDEPENDENT_AMBULATORY_CARE_PROVIDER_SITE_OTHER): Payer: BLUE CROSS/BLUE SHIELD | Admitting: Gastroenterology

## 2016-05-14 ENCOUNTER — Encounter: Payer: Self-pay | Admitting: Gastroenterology

## 2016-05-14 VITALS — BP 136/84 | HR 72 | Temp 97.8°F | Ht 69.0 in | Wt 186.6 lb

## 2016-05-14 DIAGNOSIS — K5010 Crohn's disease of large intestine without complications: Secondary | ICD-10-CM | POA: Diagnosis not present

## 2016-05-14 DIAGNOSIS — K2100 Gastro-esophageal reflux disease with esophagitis, without bleeding: Secondary | ICD-10-CM

## 2016-05-14 NOTE — Assessment & Plan Note (Signed)
Chronic GERD. Evidence of GERD on recent EGD but no Barrett's. Doing well on generic Nexium twice a day. Patient reluctant to change. Offered other options such as pantoprazole which she has never tried. He'll let you know if he decides to try new PPI. Discussed benefits would be to get him back down to once daily PPI if possible. Also discussed possibility of check a magnesium and potassium levels given his concerns. He is not interested at this time. Not interested in any blood work at this time. Return to the office in one year or sooner if needed.

## 2016-05-14 NOTE — Patient Instructions (Signed)
1. Continue generic nexium twice daily for now. If you decide you would like to try a different medication for your reflux, please let me know. 2. Continue Pentasa as needed for now. Let me know if you want to transition to a different mesalamine when you are near completion of your current prescription. 3. You should consider baseline bone density study.  4. Return to the office in one year or sooner if needed.

## 2016-05-14 NOTE — Progress Notes (Signed)
CC'ED TO PCP 

## 2016-05-14 NOTE — Progress Notes (Signed)
Primary Care Physician:  Purvis Kilts, MD  Primary Gastroenterologist:  Garfield Cornea, MD   Chief Complaint  Patient presents with  . Follow-up    HPI:  Tyler Gates is a 60 y.o. male here for follow-up of Crohn's colitis and GERD. Greater than 35 year history of Crohn's disease diagnosed in [place]. Back in November 2016 EGD showed evidence of GERD. Ileocolonoscopy showed scattered left-sided diverticula, very subtle neovascular changes of the ascending segment and some erythema diffusely in the cecum. Cecal biopsies consistent with active Crohn's disease. Ace ascending colon biopsies also consistent with active Crohn's. Random colon biopsies looking for dysplasia, unremarkable.  Patient was put on off label Pentasa last year prior to his colonoscopy. Patient states Pentasa has helped with bloating and gas. Bowel movements are regular. The only issue he has had is the smell of his gas tends to be that similar to previous flares but he has no other symptoms. Denies abdominal pain. Continues to utilize Pentasa when necessary only. Takes a few weeks at a time. Takes 8 pills daily. Reflux has been controlled on generic Nexium 40 mg twice a day. Previously controlled on name brand Nexium once daily but his insurance company would not cover it. Trial of Dexilant samples not helpful.  Patient had multiple questions regarding his procedures, Crohn's and GERD, medications. All questions answered. He did have concerns about possible low magnesium level in the setting of PPI and B12 deficiency in the setting of Crohn's. He has never had bone density study. Generally has a little fatigue was he takes multivitamins. He is an avid cyclist, takes potassium about 4 times weekly to control cramps.    Current Outpatient Prescriptions  Medication Sig Dispense Refill  . amLODipine (NORVASC) 2.5 MG tablet Take 1 tablet by mouth 2 (two) times daily. Take 1 tab bid    . ANDROGEL 50 MG/5GM (1%) GEL daily.  Apply as directed every morning  2  . aspirin 81 MG tablet Take 81 mg by mouth daily.    Marland Kitchen esomeprazole (NEXIUM) 40 MG capsule Take 1 capsule by mouth 2 (two) times daily. Take 1 tab daily    . ezetimibe (ZETIA) 10 MG tablet Take 1 tablet (10 mg total) by mouth daily. 90 tablet 3  . ibuprofen (ADVIL,MOTRIN) 200 MG tablet Take 200 mg by mouth every 6 (six) hours as needed.    . mesalamine (PENTASA) 500 MG CR capsule Take 2 capsules (1,000 mg total) by mouth 4 (four) times daily. Take for 4-8 weeks with flares as discussed. 720 capsule 3  . olmesartan (BENICAR) 20 MG tablet Take 2 tablets (40 mg total) by mouth daily. (Patient taking differently: Take 20 mg by mouth daily. ) 180 tablet 3  . venlafaxine (EFFEXOR) 37.5 MG tablet Take 1 tablet by mouth 2 (two) times daily. Take 1 tab twice a day     No current facility-administered medications for this visit.    Allergies as of 05/14/2016 - Review Complete 05/14/2016  Allergen Reaction Noted  . Statins  03/06/2007    Past Medical History  Diagnosis Date  . Hyperlipidemia   . Dyspnea on exertion   . Snoring   . Instability of knee joint   . Hypertension   . GERD (gastroesophageal reflux disease)   . Depression   . Crohn's disease [facility])     Past Surgical History  Procedure Laterality Date  . Colonoscopy  2007    Dr. Sabino Donovan normal TI, ascending colon/cecum with mild  inflammation noted but biopses without signs of active inflammation, hyperpastic polyp, possible ischemia on path of left colon biopsy  . Esophagogastroduodenoscopy  2007    Dr. Sabino Donovan reflux esophagitis, hiatal hernia, negative H.pylori  . Esophagogastroduodenoscopy N/A 11/08/2015    Procedure: ESOPHAGOGASTRODUODENOSCOPY (EGD);  Surgeon: [name], MD;  [facility];  Service: Endoscopy;  Laterality: N/A;  0830 - moved to 8:15 - office to notify  . Colonoscopy N/A 11/08/2015    Procedure: COLONOSCOPY;  Surgeon: [name], MD;  Location: [facility];   Service: Endoscopy;  Laterality: N/A;    Family History  Problem Relation Age of Onset  . Colon cancer Neg Hx   . Melanoma Father     Social History   Social History  . Marital Status: Married    Spouse Name: N/A  . Number of Children: N/A  . Years of Education: N/A   Occupational History  . Not on file.   Social History Main Topics  . Smoking status: Never Smoker   . Smokeless tobacco: Not on file  . Alcohol Use: Yes     Comment: glass a wine once a week  . Drug Use: No  . Sexual Activity: Not on file   Other Topics Concern  . Not on file   Social History Narrative      ROS:  General: Negative for anorexia, weight loss, fever, chills, fatigue, weakness. Eyes: Negative for vision changes.  ENT: Negative for hoarseness, difficulty swallowing , nasal congestion. CV: Negative for chest pain, angina, palpitations, dyspnea on exertion, peripheral edema.  Respiratory: Negative for dyspnea at rest, dyspnea on exertion, cough, sputum, wheezing.  GI: See history of present illness. GU:  Negative for dysuria, hematuria, urinary incontinence, urinary frequency, nocturnal urination.  MS: Negative for joint pain, low back pain.  Derm: Negative for rash or itching.  Neuro: Negative for weakness, abnormal sensation, seizure, frequent headaches, memory loss, confusion.  Psych: Negative for anxiety, depression, suicidal ideation, hallucinations.  Endo: Negative for unusual weight change.  Heme: Negative for bruising or bleeding. Allergy: Negative for rash or hives.    Physical Examination:  BP 136/84 mmHg  Pulse 72  Temp(Src) 97.8 F (36.6 C)  Ht 5' 9"  (1.753 m)  Wt 186 lb 9.6 oz (84.641 kg)  BMI 27.54 kg/m2   General: Well-nourished, well-developed in no acute distress.  Head: Normocephalic, atraumatic.   Eyes: Conjunctiva pink, no icterus. Mouth: Oropharyngeal mucosa moist and pink , no lesions erythema or exudate. Neck: Supple without thyromegaly, masses, or  lymphadenopathy.  Lungs: Clear to auscultation bilaterally.  Heart: Regular rate and rhythm, no murmurs rubs or gallops.  Abdomen: Bowel sounds are normal, nontender, nondistended, no hepatosplenomegaly or masses, no abdominal bruits or    hernia , no rebound or guarding.   Rectal: Not performed Extremities: No lower extremity edema. No clubbing or deformities.  Neuro: Alert and oriented x 4 , grossly normal neurologically.  Skin: Warm and dry, no rash or jaundice.   Psych: Alert and cooperative, normal mood and affect.  Labs: No recent labs  Imaging Studies: No results found.

## 2016-05-14 NOTE — Assessment & Plan Note (Addendum)
Crohn's colitis. Recent surveillance colonoscopy without evidence of dysplasia. He had active disease in the ascending colon and cecum on biopsy with minimal changes endoscopically. Utilizes Pentasa when necessary flare only, off label. Discussed that given his Crohn's is in his: Only, we could consider other mesalamine preparations. Patient is not interested in changing this time because he has significant supply of Pentasa at home. He'll let me know if he decides to change when he is almost finished with current prescription. Not interested in pursuing any blood work at this time. We will obtain any recent labs done through PCP. Turn to the office in one year or sooner if needed. Patient aware that he will require surveillance colonoscopies given his Crohn's colitis of greater than 35 years. Likely continue on 2 year intervals. Will discuss at next office visit  I did suggest bone density study for baseline given his Crohn's, he is at increased risk of osteopenia/osteoporosis. Patient will let me know if he decides to pursue.

## 2016-06-19 ENCOUNTER — Other Ambulatory Visit: Payer: Self-pay | Admitting: Internal Medicine

## 2016-06-19 NOTE — Telephone Encounter (Signed)
Rx request sent to pharmacy.  

## 2016-06-28 ENCOUNTER — Ambulatory Visit (INDEPENDENT_AMBULATORY_CARE_PROVIDER_SITE_OTHER): Payer: BLUE CROSS/BLUE SHIELD | Admitting: Internal Medicine

## 2016-06-28 ENCOUNTER — Encounter: Payer: Self-pay | Admitting: Internal Medicine

## 2016-06-28 VITALS — BP 126/90 | HR 60 | Ht 69.0 in | Wt 189.6 lb

## 2016-06-28 DIAGNOSIS — E785 Hyperlipidemia, unspecified: Secondary | ICD-10-CM | POA: Diagnosis not present

## 2016-06-28 DIAGNOSIS — I10 Essential (primary) hypertension: Secondary | ICD-10-CM | POA: Diagnosis not present

## 2016-06-28 DIAGNOSIS — I4510 Unspecified right bundle-branch block: Secondary | ICD-10-CM | POA: Diagnosis not present

## 2016-06-28 NOTE — Patient Instructions (Signed)
Medication Instructions:  No Changes  Labwork: None  Testing/Procedures: None  Follow-Up: Your physician recommends that you schedule a follow-up appointment in: 6 months with Dr Debara Pickett   Any Other Special Instructions Will Be Listed Below (If Applicable).     If you need a refill on your cardiac medications before your next appointment, please call your pharmacy.

## 2016-06-28 NOTE — Progress Notes (Signed)
OFFICE NOTE  Chief Complaint: Routine follow-up, no complaints  Primary Care Physician: Purvis Kilts, MD  HPI:  Tyler Gates this pleasant 60 year old male that I last saw in 2014. He has a history of hypertension and marked dyslipidemia and has been intolerant to statins. He's an avid cyclist but does have a family history of heart disease which is significant and his father had coronary events at an early age. He underwent metabolic testing in December 2013 which showed an RER of 1.08, peak VO2 01 14% predicted which was excellent. At the time he had good control over his blood pressure however recently he's been having problems with low blood pressure. He says in the fall his blood pressure was elevated and he was switched from Exforge 10/320 mg daily to valsartan 320 mg daily. During bike rides an exercise he felt that he would get dizzy at times and thought that his blood pressure possibly was getting low. He did see some systolic blood pressures in the 90s. Subsequently he was changed over to Azor 5/40 mg daily. He says he's uncomfortable with this medicine and has some unusual side effects including dizziness. He unfortunately has not gotten treatment of his dyslipidemia which was remarkable. His total cholesterol was 266, with triglycerides 439, HDL 41 and LDL particle #[id number] in 2014. He denies any chest pain or worsening shortness of breath with exertion.  Tyler Gates returns today for follow-up. He reports some continued lability in his blood pressures. He felt that the valsartan was working fairly well but he still has some problems with dizziness and lower blood pressure. He saw Cyril Mourning, our pharmacist who made adjustments in his medications. He has since gone back to [name]. He feels like the worst time is when he takes the medicine and doesn't ride or bicycling within a couple hours of taking the medicine and he feels dizzy and her has low blood pressure. He then has had a few  episodes were his blood pressures been elevated. Once when he was on a trip to [place] and in the car for several hours. He was under a lot of stress and blood pressure got up to the 170s which he had a headache.  I saw Tyler Gates back in the office today. After some adjustments on his medicines last time he feels that he now has good control of his blood pressure. Today the office it was 122/80. He's currently taking a Benicar 40 mg daily and amlodipine 2.5 mg daily. He says that his blood pressures are not as labile as they used to be and he is not having any symptoms such as dizziness or fatigue.  Tyler Gates returns today for follow-up. Overall he is feeling well. He says his blood pressure is running a little bit higher but he's been on of his Benicar for a few days. He denies any chest pain or shortness of breath. He reports he stopped taking Zetia due to cost issues over the past year and has been out of it for several months. He was due for recheck lipid profile today. A routine EKG was performed in the office which shows a new right bundle branch block. This was not present on his prior EKG in May 2016. He tells me however he is asymptomatic and in fact has gone on to very long bike rides over the past several weeks without any change in exercise tolerance.  06/28/2016  Tyler Gates was seen back today in the office for follow-up. He  continues to cycle and exercise regularly and is without cardiac complaints. Blood pressure appears to be fairly well-controlled although he has persistent diastolic hypertension. At his last office visit I started him on ezetimibe due to intolerance of statins and he seems to be taking that without issues. He recently had blood work through his primary care provider which we will request today. He indicated that his cholesterol was down but not optimized.  PMHx:  Past Medical History  Diagnosis Date  . Hyperlipidemia   . Dyspnea on exertion   . Snoring   . Instability of knee  joint   . Hypertension   . GERD (gastroesophageal reflux disease)   . Depression   . Crohn's disease [facility])     Past Surgical History  Procedure Laterality Date  . Colonoscopy  2007    Dr. Sabino Donovan normal TI, ascending colon/cecum with mild inflammation noted but biopses without signs of active inflammation, hyperpastic polyp, possible ischemia on path of left colon biopsy  . Esophagogastroduodenoscopy  2007    Dr. Sabino Donovan reflux esophagitis, hiatal hernia, negative H.pylori  . Esophagogastroduodenoscopy N/A 11/08/2015    Procedure: ESOPHAGOGASTRODUODENOSCOPY (EGD);  Surgeon: [name], MD;  [facility];  Service: Endoscopy;  Laterality: N/A;  0830 - moved to 8:15 - office to notify  . Colonoscopy N/A 11/08/2015    Procedure: COLONOSCOPY;  Surgeon: [name], MD;  Location: [facility];  Service: Endoscopy;  Laterality: N/A;    FAMHx:  Family History  Problem Relation Age of Onset  . Colon cancer Neg Hx   . Melanoma Father   . Heart disease Father   . Healthy Mother     SOCHx:   reports that he has never smoked. He has never used smokeless tobacco. He reports that he drinks alcohol. He reports that he does not use illicit drugs.  ALLERGIES:  Allergies  Allergen Reactions  . Statins     REACTION: Myalgia    ROS: A comprehensive review of systems was negative.  HOME MEDS: Current Outpatient Prescriptions  Medication Sig Dispense Refill  . amLODipine (NORVASC) 2.5 MG tablet Take 2.5 mg by mouth daily.    . ANDROGEL 50 MG/5GM (1%) GEL daily. Apply as directed every morning  2  . aspirin 81 MG tablet Take 81 mg by mouth daily.    Marland Kitchen esomeprazole (NEXIUM) 40 MG capsule Take 1 capsule by mouth 2 (two) times daily. Take 1 tab daily    . ezetimibe (ZETIA) 10 MG tablet Take 1 tablet (10 mg total) by mouth daily. 90 tablet 3  . ibuprofen (ADVIL,MOTRIN) 200 MG tablet Take 200 mg by mouth every 6 (six) hours as needed for moderate pain.     . mesalamine (PENTASA) 500  MG CR capsule Take 2 capsules (1,000 mg total) by mouth 4 (four) times daily. Take for 4-8 weeks with flares as discussed. 720 capsule 3  . olmesartan (BENICAR) 20 MG tablet Take 20 mg by mouth daily.    Marland Kitchen venlafaxine (EFFEXOR) 37.5 MG tablet Take 1 tablet by mouth 2 (two) times daily. Take 1 tab twice a day     No current facility-administered medications for this visit.    LABS/IMAGING: No results found for this or any previous visit (from the past 48 hour(s)). No results found.  VITALS: BP 126/90 mmHg  Pulse 60  Ht 5' 9"  (1.753 m)  Wt 189 lb 9.6 oz (86.002 kg)  BMI 27.99 kg/m2  EXAM: General appearance: alert and  no distress Neck: no carotid bruit and no JVD Lungs: clear to auscultation bilaterally Heart: regular rate and rhythm, S1, S2 normal, no murmur, click, rub or gallop Abdomen: soft, non-tender; bowel sounds normal; no masses,  no organomegaly Extremities: extremities normal, atraumatic, no cyanosis or edema Pulses: 2+ and symmetric Skin: Skin color, texture, turgor normal. No rashes or lesions Neurologic: Grossly normal Psych: Pleasant  EKG: Normal sinus rhythm at 60, iRBBB  ASSESSMENT: 1. Labile hypertension - at goal now 2. Untreated dyslipidemia 3. Alternating RBBB and iRBBB, asymptomatic  PLAN: 1.   Tyler Gates has generally good control of her blood pressures although diastolic hypertension still an issue. He continues to adjust medications based on the season and how active he is or how hot the weather is. His cholesterol apparently is better we will obtain those lab results from his primary care provider. He was recently started on Zetia, but likely will benefit more from statin if he would be interested. No changes to his medicines today. He thinks he may wish to be on brand name Benicar if it is still possible to get that prescription and may call the office for a d.a.w. Prescription. Follow-up with me in 6 months.  Pixie Casino, MD, [facility] Attending  Cardiologist [place] C Hilty 06/28/2016, 8:36 AM

## 2016-07-12 ENCOUNTER — Telehealth: Payer: Self-pay | Admitting: Internal Medicine

## 2016-07-12 NOTE — Telephone Encounter (Signed)
Received labs from PCP - reviewed by Dr. Debara Pickett and he notes LDL is still very high - would like patient to try crestor 3m QD  Total cholesterol 260 Triglycerides  259 HDL   40 VLDL   52 LDL   168

## 2016-07-23 MED ORDER — ROSUVASTATIN CALCIUM 5 MG PO TABS: 5.0000 mg | ORAL_TABLET | Freq: Every day | ORAL | 1 refills | Status: DC

## 2016-07-23 NOTE — Telephone Encounter (Signed)
Spoke with patient. He agrees to try crestor 78m once daily - he states this is a statin he has not yet tried. He requested that it be sent to his local pharmacy so he can try it. He will call back and let [place] if he tolerates this medication, then it can be sent to mail order pharmacy, or if he does not tolerate it at which point MD will be notified.

## 2016-10-08 ENCOUNTER — Ambulatory Visit (INDEPENDENT_AMBULATORY_CARE_PROVIDER_SITE_OTHER): Payer: BLUE CROSS/BLUE SHIELD | Admitting: Orthopedic Surgery

## 2016-10-08 ENCOUNTER — Encounter: Payer: Self-pay | Admitting: Orthopedic Surgery

## 2016-10-08 ENCOUNTER — Ambulatory Visit (INDEPENDENT_AMBULATORY_CARE_PROVIDER_SITE_OTHER): Payer: BLUE CROSS/BLUE SHIELD

## 2016-10-08 VITALS — BP 141/92 | HR 84 | Wt 182.0 lb

## 2016-10-08 DIAGNOSIS — M25561 Pain in right knee: Secondary | ICD-10-CM

## 2016-10-08 DIAGNOSIS — M25461 Effusion, right knee: Secondary | ICD-10-CM | POA: Diagnosis not present

## 2016-10-08 NOTE — Patient Instructions (Signed)

## 2016-10-08 NOTE — Progress Notes (Signed)
Chief Complaint  Patient presents with  . Knee Pain    RIGHT KNEE PAIN, DOI 10/07/16    HPI   60 year old male presents with a new problem of Mild to moderate pain right knee after climbing into a truck; sustained injury on October 16.However, he also noticed over the last 4-6 weeks that when his knee was in a severely or hyperflexed position and trouble getting up. He had trouble bending the knee. He noticed that the knee was swollen  Review of Systems  Constitutional: Negative for fever.  Respiratory: Negative for shortness of breath.   Cardiovascular: Negative for chest pain.    Past Medical History:  Diagnosis Date  . Crohn's disease ([place])   . Depression   . Dyspnea on exertion   . GERD (gastroesophageal reflux disease)   . Hyperlipidemia   . Hypertension   . Instability of knee joint   . Snoring     Past Surgical History:  Procedure Laterality Date  . COLONOSCOPY  2007   Dr. Sabino Donovan normal TI, ascending colon/cecum with mild inflammation noted but biopses without signs of active inflammation, hyperpastic polyp, possible ischemia on path of left colon biopsy  . COLONOSCOPY N/A 11/08/2015   Procedure: COLONOSCOPY;  Surgeon: [name], MD;  [facility];  Service: Endoscopy;  Laterality: N/A;  . ESOPHAGOGASTRODUODENOSCOPY  2007   Dr. Sabino Donovan reflux esophagitis, hiatal hernia, negative H.pylori  . ESOPHAGOGASTRODUODENOSCOPY N/A 11/08/2015   Procedure: ESOPHAGOGASTRODUODENOSCOPY (EGD);  Surgeon: [name], MD;  [facility];  Service: Endoscopy;  Laterality: N/A;  0830 - moved to 8:15 - office to notify   Family History  Problem Relation Age of Onset  . Colon cancer Neg Hx   . Melanoma Father   . Heart disease Father   . Healthy Mother    Social History  Substance Use Topics  . Smoking status: Never Smoker  . Smokeless tobacco: Never Used  . Alcohol use 0.0 oz/week     Comment: glass a wine once a week   Current Meds  Medication Sig  .  amLODipine (NORVASC) 2.5 MG tablet Take 2.5 mg by mouth daily.  Marland Kitchen esomeprazole (NEXIUM) 40 MG capsule Take 1 capsule by mouth 2 (two) times daily. Take 1 tab daily  . ezetimibe (ZETIA) 10 MG tablet Take 1 tablet (10 mg total) by mouth daily.  Marland Kitchen venlafaxine (EFFEXOR) 37.5 MG tablet Take 1 tablet by mouth 2 (two) times daily. Take 1 tab twice a day    BP (!) 141/92   Pulse 84   Wt 182 lb (82.6 kg)   BMI 26.88 kg/m   Physical Exam  Constitutional: He is oriented to person, place, and time. He appears well-developed and well-nourished. No distress.  Cardiovascular: Normal rate and intact distal pulses.   Musculoskeletal:       Right knee: He exhibits effusion.  GAIT limping  Neurological: He is alert and oriented to person, place, and time.  Skin: Skin is warm and dry. No rash noted. He is not diaphoretic. No erythema. No pallor.  Psychiatric: He has a normal mood and affect. His behavior is normal. Judgment and thought content normal.    Right Knee Exam   Tenderness  The patient is experiencing tenderness in the medial joint line.  Range of Motion  Extension: normal  Flexion:  60 normal   Muscle Strength   The patient has normal right knee strength.  Tests  Mc[place]ray:  Medial - positive Lateral -  negative Drawer:       Anterior - negative    Posterior - negative Varus: negative Valgus: negative  Other  Erythema: absent Scars: absent Sensation: normal Pulse: present Swelling: mild Other tests: effusion present   Left Knee Exam   Tenderness  The patient is experiencing no tenderness.     Range of Motion  Extension: normal  Flexion: normal   Muscle Strength   The patient has normal left knee strength.  Tests  McMurray:  Medial - negative Lateral - negative Drawer:       Anterior - negative     Posterior - trace Varus: negative Valgus: negative  Other  Erythema: absent Scars: present Sensation: normal Pulse: present Swelling:  none       ASSESSMENT: My personal interpretation of the images:  Moderate arthritis medial side right knee moderate joint effusion  Encounter Diagnoses  Name Primary?  . Acute pain of right knee Yes  . Effusion of right knee    Rule out medial meniscal tear  PLAN I aspirated and injected the right knee with 35 mL of blood-tinged synovial fluid removed  Recommend ice, avoid squatting and kneeling bending  Return 2 weeks  Procedure note injection and aspiration right knee joint  Verbal consent was obtained to aspirate and inject the right knee joint   Timeout was completed to confirm the site of aspiration and injection  An 18-gauge needle was used to aspirate the knee joint from a suprapatellar lateral approach.  The medications used were 40 mg of Depo-Medrol and 1% lidocaine 3 cc  Anesthesia was provided by ethyl chloride and the skin was prepped with alcohol.  After cleaning the skin with alcohol an 18-gauge needle was used to aspirate the right knee joint.  We obtained 35 cc of fluid  We follow this by injection of 40 mg of Depo-Medrol and 3 cc 1% lidocaine.  There were no complications. A sterile bandage was applied.    Arther Abbott, MD 10/08/2016 10:29 AM

## 2016-10-21 ENCOUNTER — Encounter: Payer: Self-pay | Admitting: Orthopedic Surgery

## 2016-10-21 ENCOUNTER — Ambulatory Visit (INDEPENDENT_AMBULATORY_CARE_PROVIDER_SITE_OTHER): Payer: BLUE CROSS/BLUE SHIELD | Admitting: Orthopedic Surgery

## 2016-10-21 DIAGNOSIS — M25461 Effusion, right knee: Secondary | ICD-10-CM

## 2016-10-21 NOTE — Patient Instructions (Signed)
Home exercises  Aleve as needed

## 2016-10-21 NOTE — Progress Notes (Signed)
Patient ID: Tyler Gates, male   DOB: 10-17-1956, 60 y.o.   MRN: [id number]  Chief Complaint  Patient presents with  . Follow-up    right knee pain    HPI Tyler Gates is a 60 y.o. male.   HPI  HPI    60 year old male presents with a new problem of Mild to moderate pain right knee after climbing into a truck; sustained injury on October 16.However, he also noticed over the last 4-6 weeks that when his knee was in a severely or hyperflexed position and trouble getting up. He had trouble bending the knee. He noticed that the knee was swollen    Review of Systems Review of Systems  Review of Systems  Constitutional: Negative for fever.  Respiratory: Negative for shortness of breath.   Cardiovascular: Negative for chest pain.   Physical Exam There were no vitals taken for this visit.   Physical Exam  No effusion today. Knee has regained full flexion  Patient will take Aleve on an as-needed basis use ice as needed. If he has constant pain or pain unrelieved by ice and Aleve or if he has swelling where he can't bend his knee will come back to see [place]  PEP exercises

## 2016-11-25 ENCOUNTER — Other Ambulatory Visit: Payer: Self-pay | Admitting: Gastroenterology

## 2016-12-17 ENCOUNTER — Other Ambulatory Visit: Payer: Self-pay | Admitting: Internal Medicine

## 2016-12-18 ENCOUNTER — Other Ambulatory Visit: Payer: Self-pay

## 2016-12-18 MED ORDER — OLMESARTAN MEDOXOMIL 20 MG PO TABS: 20.0000 mg | ORAL_TABLET | Freq: Every day | ORAL | 0 refills | Status: DC

## 2016-12-18 NOTE — Telephone Encounter (Signed)
lmtcb-is pt still taking benicar? Also due for his follow up appt.  On 06-28-16 noted has dose change, do you want to refill?

## 2016-12-18 NOTE — Telephone Encounter (Signed)
Pt says he does take the generic Benicar. Please have it refilled.

## 2016-12-18 NOTE — Telephone Encounter (Signed)
Spoke with pt pt is taking Olmesartan 30m daily-refilled 90 days to [facility] requested by pt. Appt scheduled for follow up with Dr HDebara Pickett

## 2016-12-18 NOTE — Addendum Note (Signed)
Addended by: Waylan Rocher on: 12/18/2016 11:42 AM   Modules accepted: Orders

## 2016-12-20 ENCOUNTER — Other Ambulatory Visit: Payer: Self-pay

## 2016-12-20 MED ORDER — OLMESARTAN MEDOXOMIL 20 MG PO TABS: 20.0000 mg | ORAL_TABLET | Freq: Every day | ORAL | 0 refills | Status: DC

## 2016-12-25 ENCOUNTER — Telehealth: Payer: Self-pay | Admitting: Internal Medicine

## 2016-12-25 MED ORDER — OLMESARTAN MEDOXOMIL 20 MG PO TABS: 20.0000 mg | ORAL_TABLET | Freq: Every day | ORAL | 0 refills | Status: DC

## 2016-12-25 NOTE — Telephone Encounter (Signed)
New Message  Pt c/o medication issue:  1. Name of Medication: olmesartan (Benicar) 20 mg tablet once daily  2. How are you currently taking this medication (dosage and times per day)? See above  3. Are you having a reaction (difficulty breathing--STAT)? N/A  4. What is your medication issue? Pharmacist voiced he received two different directions for this medication. The dosage is correct but he received one stating once daily and another one twice daily.  Please f/u

## 2016-12-25 NOTE — Telephone Encounter (Signed)
Per last office note, patient takes benicar QD Rx(s) [facility])

## 2017-01-03 ENCOUNTER — Encounter: Payer: Self-pay | Admitting: Internal Medicine

## 2017-01-03 ENCOUNTER — Ambulatory Visit (INDEPENDENT_AMBULATORY_CARE_PROVIDER_SITE_OTHER): Payer: BLUE CROSS/BLUE SHIELD | Admitting: Internal Medicine

## 2017-01-03 VITALS — BP 132/98 | HR 69 | Ht 69.0 in | Wt 183.0 lb

## 2017-01-03 DIAGNOSIS — I10 Essential (primary) hypertension: Secondary | ICD-10-CM | POA: Diagnosis not present

## 2017-01-03 DIAGNOSIS — E785 Hyperlipidemia, unspecified: Secondary | ICD-10-CM

## 2017-01-03 DIAGNOSIS — I4510 Unspecified right bundle-branch block: Secondary | ICD-10-CM

## 2017-01-03 MED ORDER — AMLODIPINE BESYLATE 2.5 MG PO TABS: 2.5000 mg | ORAL_TABLET | Freq: Two times a day (BID) | ORAL | 3 refills | Status: DC

## 2017-01-03 NOTE — Progress Notes (Signed)
OFFICE NOTE  Chief Complaint: Routine follow-up, no complaints  Primary Care Physician: Purvis Kilts, MD  HPI:  Tyler Gates this pleasant 61 year old male that I last saw in 2014. He has a history of hypertension and marked dyslipidemia and has been intolerant to statins. He's an avid cyclist but does have a family history of heart disease which is significant and his father had coronary events at an early age. He underwent metabolic testing in December 2013 which showed an RER of 1.08, peak VO2 01 14% predicted which was excellent. At the time he had good control over his blood pressure however recently he's been having problems with low blood pressure. He says in the fall his blood pressure was elevated and he was switched from Exforge 10/320 mg daily to valsartan 320 mg daily. During bike rides an exercise he felt that he would get dizzy at times and thought that his blood pressure possibly was getting low. He did see some systolic blood pressures in the 90s. Subsequently he was changed over to Azor 5/40 mg daily. He says he's uncomfortable with this medicine and has some unusual side effects including dizziness. He unfortunately has not gotten treatment of his dyslipidemia which was remarkable. His total cholesterol was 266, with triglycerides 439, HDL 41 and LDL particle #[id number] in 2014. He denies any chest pain or worsening shortness of breath with exertion.  61 returns today for follow-up. He reports some continued lability in his blood pressures. He felt that the valsartan was working fairly well but he still has some problems with dizziness and lower blood pressure. He saw Cyril Mourning, our pharmacist who made adjustments in his medications. He has since gone back to [name]. He feels like the worst time is when he takes the medicine and doesn't ride or bicycling within a couple hours of taking the medicine and he feels dizzy and her has low blood pressure. He then has had a few  episodes were his blood pressures been elevated. Once when he was on a trip to [place] and in the car for several hours. He was under a lot of stress and blood pressure got up to the 170s which he had a headache.  I saw Tyler Gates back in the office today. After some adjustments on his medicines last time he feels that he now has good control of his blood pressure. Today the office it was 122/80. He's currently taking a Benicar 40 mg daily and amlodipine 2.5 mg daily. He says that his blood pressures are not as labile as they used to be and he is not having any symptoms such as dizziness or fatigue.  Tyler Gates returns today for follow-up. Overall he is feeling well. He says his blood pressure is running a little bit higher but he's been on of his Benicar for a few days. He denies any chest pain or shortness of breath. He reports he stopped taking Zetia due to cost issues over the past year and has been out of it for several months. He was due for recheck lipid profile today. A routine EKG was performed in the office which shows a new right bundle branch block. This was not present on his prior EKG in May 2016. He tells me however he is asymptomatic and in fact has gone on to very long bike rides over the past several weeks without any change in exercise tolerance.  06/28/2016  61 was seen back today in the office for follow-up. He  continues to cycle and exercise regularly and is without cardiac complaints. Blood pressure appears to be fairly well-controlled although he has persistent diastolic hypertension. At his last office visit I started him on ezetimibe due to intolerance of statins and he seems to be taking that without issues. He recently had blood work through his primary care provider which we will request today. He indicated that his cholesterol was down but not optimized.  01/03/2017  61 returns today for follow-up. In the interim he is mesh lose about 10 pounds. Unfortunately his blood pressure  still remains a little bit elevated as far as the diastolic blood pressure is concerned. He was previously on Benicar 40 mg daily however that has been reduced to 20 mg. He is also taking amlodipine 5 mg instead of 2.5 mg daily. He continues to cycle and denies any chest pain or worsening shortness of breath. He is unsteady and has had statin intolerance in the past. He's overdue for repeat lipid profile.  PMHx:  Past Medical History:  Diagnosis Date  . Crohn's disease ([place])   . Depression   . Dyspnea on exertion   . GERD (gastroesophageal reflux disease)   . Hyperlipidemia   . Hypertension   . Instability of knee joint   . Snoring     Past Surgical History:  Procedure Laterality Date  . COLONOSCOPY  2007   Dr. Sabino Donovan normal TI, ascending colon/cecum with mild inflammation noted but biopses without signs of active inflammation, hyperpastic polyp, possible ischemia on path of left colon biopsy  . COLONOSCOPY N/A 11/08/2015   Procedure: COLONOSCOPY;  Surgeon: [name], MD;  Location: [facility];  Service: Endoscopy;  Laterality: N/A;  . ESOPHAGOGASTRODUODENOSCOPY  2007   Dr. Sabino Donovan reflux esophagitis, hiatal hernia, negative H.pylori  . ESOPHAGOGASTRODUODENOSCOPY N/A 11/08/2015   Procedure: ESOPHAGOGASTRODUODENOSCOPY (EGD);  Surgeon: [name], MD;  Location: [facility];  Service: Endoscopy;  Laterality: N/A;  0830 - moved to 8:15 - office to notify    FAMHx:  Family History  Problem Relation Age of Onset  . Colon cancer Neg Hx   . Melanoma Father   . Heart disease Father   . Healthy Mother     SOCHx:   reports that he has never smoked. He has never used smokeless tobacco. He reports that he drinks alcohol. He reports that he does not use drugs.  ALLERGIES:  Allergies  Allergen Reactions  . Statins     REACTION: Myalgia    ROS: A comprehensive review of systems was negative.  HOME MEDS: Current Outpatient Prescriptions  Medication Sig Dispense Refill  .  amLODipine (NORVASC) 2.5 MG tablet Take 2.5 mg by mouth 2 (two) times daily.    Marland Kitchen esomeprazole (NEXIUM) 40 MG capsule Take 1 capsule by mouth 2 (two) times daily.     Marland Kitchen ezetimibe (ZETIA) 10 MG tablet Take 1 tablet (10 mg total) by mouth daily. 90 tablet 3  . olmesartan (BENICAR) 20 MG tablet Take 1 tablet (20 mg total) by mouth daily. 90 tablet 0  . venlafaxine (EFFEXOR) 37.5 MG tablet Take 1 tablet by mouth 2 (two) times daily. Take 1 tab twice a day     No current facility-administered medications for this visit.     LABS/IMAGING: No results found for this or any previous visit (from the past 48 hour(s)). No results found.  VITALS: BP (!) 132/98   Pulse 69   Ht 5' 9"  (1.753 m)   Wt 183  lb (83 kg)   BMI 27.02 kg/m   EXAM: General appearance: alert and no distress Neck: no carotid bruit and no JVD Lungs: clear to auscultation bilaterally Heart: regular rate and rhythm, S1, S2 normal, no murmur, click, rub or gallop Abdomen: soft, non-tender; bowel sounds normal; no masses,  no organomegaly Extremities: extremities normal, atraumatic, no cyanosis or edema Pulses: 2+ and symmetric Skin: Skin color, texture, turgor normal. No rashes or lesions Neurologic: Grossly normal Psych: Pleasant  EKG: Normal sinus rhythm at 69, iRBBB  ASSESSMENT: 1. Labile hypertension 2. Dyslipidemia 3. Alternating RBBB and iRBBB, asymptomatic  PLAN: 1.   Tyler Gates has generally good control of her blood pressures although diastolic hypertension still an issue. He continues to adjust medications based on the season and how active he is or how hot the weather is. His cholesterol apparently is better we will get another fasting lipid profile today. He was recently started on Zetia, but likely will benefit more from statin if he would be interested. We will plan to move his Benicar to the morning dose and he should continue to take amlodipine 5 mg every afternoon. He will monitor blood pressure twice daily over  the next couple weeks and we will arrange for follow-up hypertension clinic visit. If he is not at goal I would consider very low-dose chlorthalidone in addition to his regimen. He might take this during the non-summer months but would be hesitant to take it during the summer when he cycling and sweating and very active. Follow-up with me annually or sooner as necessary  Pixie Casino, MD, [facility] Attending Cardiologist [place] C [place]pper [place] 01/03/2017, 9:26 AM

## 2017-01-03 NOTE — Patient Instructions (Signed)
Medication Instructions:  Increase Amlodipine to 2.55m two times daily.   Labwork: Please have lab work completed today.  Testing/Procedures: NONE  Follow-Up: Please schedule appointment with our pharmacist in 3 weeks to monitor blood pressure.  Follow up with Dr. HDebara Pickettin one year.     If you need a refill on your cardiac medications before your next appointment, please call your pharmacy.

## 2017-01-27 NOTE — Progress Notes (Signed)
Patient ID: RICHEY DOOLITTLE                 DOB: 08-28-1956                      MRN: [id number]     HPI:  Tyler Gates is a 61 y.o. male referred by Dr. Debara Pickett to HTN clinic. PMH includes hyperlipidemia and hypertension.  Patient tried various anti-hypertension medication in the past that caused dizziness and low BP. Currently tolerating Benicar plus amlodipine therapy very well, but his BP remains elevated above goal.  Patient frequently adjust therapy around his physical activity.  Also noted, patient has persistently high LDL currently managed by Zetia 71m but not at goal.  Intolerance to statin reported with mainly GI related issues and diarrhea.  Patient presents to clinic today for follow up and medication management. T Reports taking olmesartan 43mat bedtime instead of 2062mnd taking amlodipine 5mg93m bedtime as well.  Patient tolerates medication better is taken at bedtime. Denies dizziness, headaches, or fatigue.  Mountain bike season should start around April and regular bike time is ~ 1 hours per day.  BP significantly drops when biking every day.   Current HTN meds:  Benicar (olmesartan) 40 mg at bedtime Amlodipine 5mg 34mat bedtime  Current hyperlipidemia meds: Zetia 10mg 71my  Previously tried for BP: Exforge 10/320, valsartan 320 mg, Azor 5/40  BP goal: <130/80  LDL goal: < 100  Family History: heart disease - father; melanoma & colon cancer - mother  Social History: Denied smoking and smokeless tobacco. Dinks alcohol  Diet: good per patient, watches fat intake, only uses salt on occasion  Exercise: avid cyclist for years, often cycles 1hr  a day  Home BP readings:  No records available; ~ 130s since taking medication in the evening  Relevant labs: CHO 260; TG 259; HDL 40 ; LDL 168 (05/03/16)   Wt Readings from Last 3 Encounters:  01/03/17 183 lb (83 kg)  10/08/16 182 lb (82.6 kg)  06/28/16 189 lb 9.6 oz (86 kg)   BP Readings from Last 3 Encounters:    01/28/17 136/86  01/03/17 (!) 132/98  10/08/16 (!) 141/92   Pulse Readings from Last 3 Encounters:  01/28/17 83  01/03/17 69  10/08/16 84    Past Medical History:  Diagnosis Date  . Crohn's disease ([facility])  [place]   . Dyspnea on exertion   . GERD (gastroesophageal reflux disease)   . Hyperlipidemia   . Hypertension   . Instability of knee joint   . Snoring     Current Outpatient Prescriptions on File Prior to Visit  Medication Sig Dispense Refill  . amLODipine (NORVASC) 2.5 MG tablet Take 1 tablet (2.5 mg total) by mouth 2 (two) times daily. 180 tablet 3  . esomeprazole (NEXIUM) 40 MG capsule Take 1 capsule by mouth 2 (two) times daily.     . venlMarland Kitchenfaxine (EFFEXOR) 37.5 MG tablet Take 1 tablet by mouth 2 (two) times daily. Take 1 tab twice a day     No current facility-administered medications on file prior to visit.     Allergies  Allergen Reactions  . Statins     REACTION: Myalgia    Blood pressure 136/86, pulse 83, SpO2 98 %.   Essential hypertension - BP today remains evaluated above goal but diastolic pressure improved by 6 points since last office visit.  Patient is reluctant to initiate diuretic therapy due  to prior experiences with severe dehydration and hypotension while on HCTZ therapy.   Will continue current therapy with olmesartan 51m at bedtime and amlodipine 543mat bedtime.  Patient will keep records of home BP for 1 weeks (morning and evening readings) to provide better understanding of daily BP control.  I will follow up by phone in 1 week and recommend adjustment based on BP records.  If adjustment needed during phone follow up, clinic face to face appointment will be schedule at that time.  May need to keep amlodipine 11m67mt bedtime and split olmesartan dose to 31m39mice daily to keep appropriate BP control during the day.   Hyperlipidemia - Patient received counseling on LDL goal and risks associated to persistently elevated LDL. Noted patient  had ADR with statins in the past. Never tried low dose stating like crestor 11mg 39mLivalo 2mg. 51m mainly GI related, medication causes diarrhea and exacerbates his Crohn's.  Crohn's disease currently on remission per patient'ss report of recent colonoscopy.  He will consider initiation low dose statin therapy and let us knoKoreawiliness to trial during next follow up.   Tyler Gates PharmD, BCPS [place] [id number] 9:00 AM

## 2017-01-28 ENCOUNTER — Encounter: Payer: Self-pay | Admitting: Pharmacist

## 2017-01-28 ENCOUNTER — Ambulatory Visit (INDEPENDENT_AMBULATORY_CARE_PROVIDER_SITE_OTHER): Payer: BLUE CROSS/BLUE SHIELD | Admitting: Pharmacist

## 2017-01-28 VITALS — BP 136/86 | HR 83

## 2017-01-28 DIAGNOSIS — E785 Hyperlipidemia, unspecified: Secondary | ICD-10-CM

## 2017-01-28 DIAGNOSIS — I10 Essential (primary) hypertension: Secondary | ICD-10-CM | POA: Diagnosis not present

## 2017-01-28 MED ORDER — OLMESARTAN MEDOXOMIL 20 MG PO TABS: 20.0000 mg | ORAL_TABLET | Freq: Two times a day (BID) | ORAL | 1 refills | Status: DC

## 2017-01-28 MED ORDER — EZETIMIBE 10 MG PO TABS: 10.0000 mg | ORAL_TABLET | Freq: Every day | ORAL | 3 refills | Status: DC

## 2017-01-28 NOTE — Patient Instructions (Addendum)
Return for a  follow up appointment by phone in 1 week, then as needed if office  Your blood pressure today is 138/86 pulse 83  Check your blood pressure at home daily (if able) and keep record of the readings.  Take your BP meds as follows: Benicar (olmesartan) 40 mg at bedtime  Amlodipine 80m mg at bedtime  Bring all of your meds, your BP cuff and your record of home blood pressures to your next appointment.  Exercise as you're able, try to walk approximately 30 minutes per day.  Keep salt intake to a minimum, especially watch canned and prepared boxed foods.  Eat more fresh fruits and vegetables and fewer canned items.  Avoid eating in fast food restaurants.    HOW TO TAKE YOUR BLOOD PRESSURE: . Rest 5 minutes before taking your blood pressure. .  Don't smoke or drink caffeinated beverages for at least 30 minutes before. . Take your blood pressure before (not after) you eat. . Sit comfortably with your back supported and both feet on the floor (don't cross your legs). . Elevate your arm to heart level on a table or a desk. . Use the proper sized cuff. It should fit smoothly and snugly around your bare upper arm. There should be enough room to slip a fingertip under the cuff. The bottom edge of the cuff should be 1 inch above the crease of the elbow. . Ideally, take 3 measurements at one sitting and record the average.

## 2017-02-06 ENCOUNTER — Telehealth: Payer: Self-pay | Admitting: Pharmacist

## 2017-02-06 NOTE — Telephone Encounter (Signed)
Blood pressure unchanged.  Taking amlodipine 2.68m BID and olmesartan 263mBID.     Lipid panel not done yet .  Patient preference is to continue current therapy without changes and re-assess in the summmer time during active biking season.  Will have fasting Lipid panel done ASAP but  and wants to know the results of the blood work before considering crestor low dose regimen.

## 2017-02-27 ENCOUNTER — Telehealth: Payer: Self-pay | Admitting: Internal Medicine

## 2017-02-27 DIAGNOSIS — Z79899 Other long term (current) drug therapy: Secondary | ICD-10-CM

## 2017-02-27 NOTE — Telephone Encounter (Signed)
Routing to LSL 

## 2017-02-27 NOTE — Telephone Encounter (Signed)
I looked over his chart. Last seen in 04/2016. His labs then showed elevated creatinine. We need to update his before I can change his medications. Please arrange for met-7.

## 2017-02-27 NOTE — Telephone Encounter (Signed)
PATIENT CURRENTLY TAKING PENTASA AND WAS TOLD AT LAST VISIT THAT THERE WERE ALTERNATIVES HE COULD TRY.  HE WOULD LIKE TO TRY SOMETHING ELSE IF POSSIBLE.  A 30 DAY DOSE OF SOMETHING DIFFERENT SENT TO HIS PHARMACY, [place]    [phone]

## 2017-02-28 ENCOUNTER — Other Ambulatory Visit: Payer: Self-pay

## 2017-02-28 DIAGNOSIS — Z79899 Other long term (current) drug therapy: Secondary | ICD-10-CM

## 2017-02-28 NOTE — Addendum Note (Signed)
Addended by: Claudina Lick on: 02/28/2017 11:47 AM   Modules accepted: Orders

## 2017-02-28 NOTE — Telephone Encounter (Signed)
Pt is aware, he said he would go to the lab at [facility] and have this done. Lab order done and faxed.

## 2017-03-04 ENCOUNTER — Other Ambulatory Visit: Payer: Self-pay | Admitting: Gastroenterology

## 2017-03-04 ENCOUNTER — Encounter: Payer: Self-pay | Admitting: Gastroenterology

## 2017-03-04 DIAGNOSIS — Z79899 Other long term (current) drug therapy: Secondary | ICD-10-CM

## 2017-03-04 LAB — BASIC METABOLIC PANEL
BUN/Creatinine Ratio: 14 (ref 10–24)
BUN: 18 mg/dL (ref 8–27)
CO2: 27 mmol/L (ref 18–29)
Calcium: 9.3 mg/dL (ref 8.6–10.2)
Chloride: 100 mmol/L (ref 96–106)
Creatinine, Ser: 1.28 mg/dL — ABNORMAL HIGH (ref 0.76–1.27)
GFR calc Af Amer: 69 mL/min/{1.73_m2} (ref >59)
GFR calc non Af Amer: 60 mL/min/{1.73_m2} (ref >59)
Glucose: 67 mg/dL (ref 65–99)
Potassium: 4.3 mmol/L (ref 3.5–5.2)
Sodium: 141 mmol/L (ref 134–144)

## 2017-03-04 MED ORDER — MESALAMINE 1.2 G PO TBEC: 4.8000 g | DELAYED_RELEASE_TABLET | Freq: Every day | ORAL | 0 refills | Status: DC

## 2017-03-04 NOTE — Progress Notes (Signed)
APPT MADE AND LETTER SENT  °

## 2017-03-04 NOTE — Progress Notes (Signed)
Creatinine minimally elevated.  RX for Lialda take four at one time each morning. sent [facility] [place], 30 day supply.  Needs yearly f/u visit in 04/2017. Needs met-7 prior to that ov.

## 2017-03-20 ENCOUNTER — Other Ambulatory Visit: Payer: Self-pay

## 2017-03-20 DIAGNOSIS — Z79899 Other long term (current) drug therapy: Secondary | ICD-10-CM

## 2017-04-07 ENCOUNTER — Other Ambulatory Visit: Payer: Self-pay | Admitting: Gastroenterology

## 2017-05-05 ENCOUNTER — Ambulatory Visit: Payer: BLUE CROSS/BLUE SHIELD | Admitting: Gastroenterology

## 2017-05-09 ENCOUNTER — Encounter: Payer: Self-pay | Admitting: Gastroenterology

## 2017-05-09 ENCOUNTER — Ambulatory Visit (INDEPENDENT_AMBULATORY_CARE_PROVIDER_SITE_OTHER): Payer: BLUE CROSS/BLUE SHIELD | Admitting: Gastroenterology

## 2017-05-09 VITALS — BP 131/82 | HR 66 | Temp 97.4°F | Ht 69.0 in | Wt 186.2 lb

## 2017-05-09 DIAGNOSIS — K5010 Crohn's disease of large intestine without complications: Secondary | ICD-10-CM | POA: Diagnosis not present

## 2017-05-09 DIAGNOSIS — K219 Gastro-esophageal reflux disease without esophagitis: Secondary | ICD-10-CM | POA: Diagnosis not present

## 2017-05-09 MED ORDER — RESTORA PO CAPS: 1.0000 | ORAL_CAPSULE | Freq: Every day | ORAL | 0 refills | Status: DC

## 2017-05-09 MED ORDER — MESALAMINE 1.2 G PO TBEC: DELAYED_RELEASE_TABLET | ORAL | 3 refills | Status: DC

## 2017-05-09 NOTE — Patient Instructions (Signed)
1. Restora (probiotic) one daily for 3 weeks. Samples provided.  2. Continue Lialda and Nexium as before. 3. We will contact you with lab results as they are available.  4. Return to the office in one with to see Dr. Gala Romney. Call sooner if needed.

## 2017-05-09 NOTE — Progress Notes (Signed)
Primary Care Physician: Sharilyn Sites, MD  Primary Gastroenterologist:  Garfield Cornea, MD   Chief Complaint  Patient presents with  . Crohn's Disease    HPI: Tyler Gates is a 61 y.o. male here for yearly follow of Crohn's Colitis and GERD. Greater than 35 year history of Crohn's disease diagnosed in [place].November 2016 EGD showed evidence of GERD. Ileocolonoscopy showed scattered left-sided diverticula, very subtle neovascular changes of the ascending segment and some erythema diffusely in the cecum. Cecal biopsies consistent with active Crohn's disease. Ascending colon biopsies also consistent with active Crohn's. Random colon biopsies looking for dysplasia, unremarkable.  Clinically he has been doing fairly well. He finished out his Pentasa back in March and wanted to switch over to [place] which we had discussed last year. He is into his second refill. Recently was put on clindamycin for dental issues. While on clindamycin he chose not to take his mesalamine, completed antibiotics on Monday. Stools became loose, 2-3 daily. He resumed mesalamine 2 days ago, today no bowel movement yet. Does not feel bad. No significant abdominal pain. With most of his "Crohn's flares", he gets loose stools now, has not had significant abdominal pain like it had years ago. Appetite is good. He has been unable to come off of Nexium twice a day because if he does he has significant reflux. Continues to be very active. He plans to go get his blood work in the next few days to follow-up on his creatinine.    Current Outpatient Prescriptions  Medication Sig Dispense Refill  . amLODipine (NORVASC) 2.5 MG tablet Take 1 tablet (2.5 mg total) by mouth 2 (two) times daily. 180 tablet 3  . esomeprazole (NEXIUM) 40 MG capsule Take 1 capsule by mouth 2 (two) times daily.     Marland Kitchen ezetimibe (ZETIA) 10 MG tablet Take 1 tablet (10 mg total) by mouth daily. 90 tablet 3  . mesalamine (LIALDA) 1.2 g EC tablet TAKE 4  TABLETS BY MOUTH DAILY WITH BREAKFAST. 120 tablet 3  . olmesartan (BENICAR) 20 MG tablet Take 1 tablet (20 mg total) by mouth 2 (two) times daily. 180 tablet 1  . venlafaxine (EFFEXOR) 37.5 MG tablet Take 1 tablet by mouth 2 (two) times daily. Take 1 tab twice a day     No current facility-administered medications for this visit.     Allergies as of 05/09/2017 - Review Complete 05/09/2017  Allergen Reaction Noted  . Statins  03/06/2007        ROS:  General: Negative for anorexia, weight loss, fever, chills, fatigue, weakness. ENT: Negative for hoarseness, difficulty swallowing , nasal congestion. CV: Negative for chest pain, angina, palpitations, dyspnea on exertion, peripheral edema.  Respiratory: Negative for dyspnea at rest, dyspnea on exertion, cough, sputum, wheezing.  GI: See history of present illness. GU:  Negative for dysuria, hematuria, urinary incontinence, urinary frequency, nocturnal urination.  Endo: Negative for unusual weight change.    Physical Examination:   BP 131/82   Pulse 66   Temp 97.4 F (36.3 C) (Oral)   Ht 5' 9"  (1.753 m)   Wt 186 lb 3.2 oz (84.5 kg)   BMI 27.50 kg/m   General: Well-nourished, well-developed in no acute distress.  Eyes: No icterus. Mouth: Oropharyngeal mucosa moist and pink , no lesions erythema or exudate. Lungs: Clear to auscultation bilaterally.  Heart: Regular rate and rhythm, no murmurs rubs or gallops.  Abdomen: Bowel sounds are normal, nontender, nondistended, no hepatosplenomegaly or masses,  no abdominal bruits or hernia , no rebound or guarding.   Extremities: No lower extremity edema. No clubbing or deformities. Neuro: Alert and oriented x 4   Skin: Warm and dry, no jaundice.   Psych: Alert and cooperative, normal mood and affect.  Labs:  Lab Results  Component Value Date   CREATININE 1.28 (H) 03/03/2017   BUN 18 03/03/2017   NA 141 03/03/2017   K 4.3 03/03/2017   CL 100 03/03/2017   CO2 27 03/03/2017     Imaging Studies: No results found.

## 2017-05-09 NOTE — Assessment & Plan Note (Signed)
Clinically patient is doing well on current regimen. Unfortunately he has not been able to get off of twice a day dosing for refractory disease. Does not desire change at this time. Continue reflux measures. Return to office in one year or sooner

## 2017-05-09 NOTE — Assessment & Plan Note (Addendum)
Clinically has done fairly well. Historically he has taken medication during flares only and then goes months in between without maintaining maintenance Crohn's medication. In the remote past he would take Azulfidine. Over the past several years he has been on Pentasa and recently switched to Lialda due to pill burden. All off-label for Crohn's colitis. Currently some mild diarrhea possibly antibiotic associated. We did discuss possibility of C. difficile, if he has persistent diarrhea he'll let me know will check stool specimen. For now he'll continue Lialda 4 capsules every morning. We will add Restora for the next few weeks, samples provided.   Return to the office in one year for a visit with Dr. Gala Romney only, call sooner if needed.   Consider colonoscopy no later than 10/2018. Patient does not want one right now, just finished paying for the last one.

## 2017-05-12 ENCOUNTER — Telehealth: Payer: Self-pay | Admitting: Internal Medicine

## 2017-05-12 DIAGNOSIS — R197 Diarrhea, unspecified: Secondary | ICD-10-CM

## 2017-05-12 NOTE — Telephone Encounter (Signed)
[phone]  PATIENT CALLED AND STATED HE IS NOT DOING ANY BETTER AFTER THE WEEKEND AND WANTS TO GO AHEAD WITH THE TEST TO CHECK HIS STOOL THAT [facility] SUGGESTED.

## 2017-05-12 NOTE — Progress Notes (Signed)
cc'ed to pcp °

## 2017-05-13 NOTE — Addendum Note (Signed)
Addended by: Mahala Menghini on: 05/13/2017 12:13 PM   Modules accepted: Orders

## 2017-05-13 NOTE — Telephone Encounter (Signed)
Tyler Gates, what stool studies did you want?

## 2017-05-13 NOTE — Telephone Encounter (Addendum)
GI path panel and cdiff  I have placed order for [facility].

## 2017-05-14 NOTE — Telephone Encounter (Signed)
I talked to the pt, he said he has been feeling better for the past 2 days and he is going to wait on the test but he will call me back if he feels like he needs it.

## 2017-06-04 ENCOUNTER — Other Ambulatory Visit: Payer: Self-pay

## 2017-06-04 MED ORDER — AMLODIPINE BESYLATE 2.5 MG PO TABS: 2.5000 mg | ORAL_TABLET | Freq: Two times a day (BID) | ORAL | 3 refills | Status: DC

## 2017-06-09 ENCOUNTER — Telehealth: Payer: Self-pay | Admitting: Internal Medicine

## 2017-06-09 NOTE — Telephone Encounter (Signed)
Tried to call pt- NA- LMOM 

## 2017-06-09 NOTE — Telephone Encounter (Signed)
[phone]  Please call patient regarding prednisone. Patient wants to take that

## 2017-06-10 NOTE — Telephone Encounter (Signed)
Spoke with the pt, he said he has been doing ok, he will have diarrhea one day and then go a few days to a week before he has it again. He said it comes and goes. He said when he was first diagnosed with chrons that when this would happen he would take prednisone for a month or two and then he would be good for about a year before he had problems again. Pt wants to know if he can try some prednisone "just to get ahead of this thing". Pt would [facility] [place] if its ok.

## 2017-06-10 NOTE — Telephone Encounter (Signed)
Tell patient that I prefer him to check stool studies for Cdiff PCR (he was on clindamycin recently and is at risk). If he is put on steroid with active Cdiff this will make it worse.   If he is agreeable, please have him submit specimen for Cdiff PCR.  If negative, then would try Entocort (steroid type med without side effects of prednisone) as opposed to prednisone.

## 2017-06-11 ENCOUNTER — Other Ambulatory Visit: Payer: Self-pay

## 2017-06-11 ENCOUNTER — Other Ambulatory Visit: Payer: Self-pay | Admitting: Gastroenterology

## 2017-06-11 DIAGNOSIS — R197 Diarrhea, unspecified: Secondary | ICD-10-CM

## 2017-06-11 NOTE — Telephone Encounter (Signed)
Routing to [facility] for [employer]

## 2017-06-11 NOTE — Telephone Encounter (Signed)
Pt is aware. He has agreed to do stool sample. Orders are done and the container is at the front desk. Pt said he would pick it up tomorrow.

## 2017-06-12 NOTE — Telephone Encounter (Signed)
noted 

## 2017-06-14 LAB — CLOSTRIDIUM DIFFICILE BY PCR: Toxigenic C. Difficile by PCR: NEGATIVE

## 2017-06-16 ENCOUNTER — Telehealth: Payer: Self-pay

## 2017-06-16 NOTE — Telephone Encounter (Signed)
Labs are back for [facility] and are in [facility] box

## 2017-06-16 NOTE — Telephone Encounter (Signed)
Noted  

## 2017-06-17 ENCOUNTER — Telehealth: Payer: Self-pay | Admitting: Gastroenterology

## 2017-06-17 MED ORDER — BUDESONIDE 3 MG PO CPEP: 9.0000 mg | ORAL_CAPSULE | Freq: Every day | ORAL | 1 refills | Status: DC

## 2017-06-17 NOTE — Telephone Encounter (Signed)
Please let patient know that C. difficile was negative. I will send in Entocort (which is a steroid) for probable Crohn's flare. Please let me know if he has any trouble feeling the prescription.  I sent in enough for 61m daily for two months. If he feels better after 9 mg daily for one month, he can drop to 62mdaily for one month and then stop. Taper is not required for short term use however.

## 2017-06-18 NOTE — Progress Notes (Signed)
Already addressed

## 2017-06-18 NOTE — Telephone Encounter (Signed)
Pt is aware. He picked up the entocort this morning at the pharmacy. He will call if he has any problems.

## 2017-07-11 ENCOUNTER — Encounter (HOSPITAL_COMMUNITY): Payer: Self-pay | Admitting: Emergency Medicine

## 2017-07-11 ENCOUNTER — Emergency Department (HOSPITAL_COMMUNITY): Payer: BLUE CROSS/BLUE SHIELD

## 2017-07-11 ENCOUNTER — Inpatient Hospital Stay (HOSPITAL_COMMUNITY)
Admission: EM | Admit: 2017-07-11 | Discharge: 2017-07-13 | DRG: 481 | Disposition: A | Discharge status: Home / Self Care | Payer: BLUE CROSS/BLUE SHIELD | Attending: Internal Medicine | Admitting: Internal Medicine

## 2017-07-11 DIAGNOSIS — M25552 Pain in left hip: Secondary | ICD-10-CM | POA: Diagnosis present

## 2017-07-11 DIAGNOSIS — I129 Hypertensive chronic kidney disease with stage 1 through stage 4 chronic kidney disease, or unspecified chronic kidney disease: Secondary | ICD-10-CM | POA: Diagnosis present

## 2017-07-11 DIAGNOSIS — E785 Hyperlipidemia, unspecified: Secondary | ICD-10-CM | POA: Diagnosis present

## 2017-07-11 DIAGNOSIS — K5090 Crohn's disease, unspecified, without complications: Secondary | ICD-10-CM | POA: Diagnosis present

## 2017-07-11 DIAGNOSIS — D72829 Elevated white blood cell count, unspecified: Secondary | ICD-10-CM | POA: Diagnosis present

## 2017-07-11 DIAGNOSIS — Z808 Family history of malignant neoplasm of other organs or systems: Secondary | ICD-10-CM | POA: Diagnosis not present

## 2017-07-11 DIAGNOSIS — S72002A Fracture of unspecified part of neck of left femur, initial encounter for closed fracture: Secondary | ICD-10-CM

## 2017-07-11 DIAGNOSIS — M1990 Unspecified osteoarthritis, unspecified site: Secondary | ICD-10-CM | POA: Diagnosis present

## 2017-07-11 DIAGNOSIS — Z09 Encounter for follow-up examination after completed treatment for conditions other than malignant neoplasm: Secondary | ICD-10-CM

## 2017-07-11 DIAGNOSIS — Z888 Allergy status to other drugs, medicaments and biological substances status: Secondary | ICD-10-CM

## 2017-07-11 DIAGNOSIS — Z419 Encounter for procedure for purposes other than remedying health state, unspecified: Secondary | ICD-10-CM

## 2017-07-11 DIAGNOSIS — Z8249 Family history of ischemic heart disease and other diseases of the circulatory system: Secondary | ICD-10-CM

## 2017-07-11 DIAGNOSIS — N183 Chronic kidney disease, stage 3 (moderate): Secondary | ICD-10-CM | POA: Diagnosis present

## 2017-07-11 DIAGNOSIS — K219 Gastro-esophageal reflux disease without esophagitis: Secondary | ICD-10-CM | POA: Diagnosis present

## 2017-07-11 DIAGNOSIS — I10 Essential (primary) hypertension: Secondary | ICD-10-CM | POA: Diagnosis present

## 2017-07-11 HISTORY — DX: Fracture of unspecified part of neck of left femur, initial encounter for closed fracture: S72.002A

## 2017-07-11 LAB — COMPREHENSIVE METABOLIC PANEL
ALT: 27 U/L (ref 17–63)
AST: 29 U/L (ref 15–41)
Albumin: 4.2 g/dL (ref 3.5–5.0)
Alkaline Phosphatase: 59 U/L (ref 38–126)
Anion gap: 9 (ref 5–15)
BUN: 20 mg/dL (ref 6–20)
CO2: 27 mmol/L (ref 22–32)
Calcium: 9.4 mg/dL (ref 8.9–10.3)
Chloride: 102 mmol/L (ref 101–111)
Creatinine, Ser: 1.49 mg/dL — ABNORMAL HIGH (ref 0.61–1.24)
GFR calc Af Amer: 57 mL/min — ABNORMAL LOW (ref >60)
GFR calc non Af Amer: 49 mL/min — ABNORMAL LOW (ref >60)
Glucose, Bld: 130 mg/dL — ABNORMAL HIGH (ref 65–99)
Potassium: 4.2 mmol/L (ref 3.5–5.1)
Sodium: 138 mmol/L (ref 135–145)
Total Bilirubin: 0.9 mg/dL (ref 0.3–1.2)
Total Protein: 8 g/dL (ref 6.5–8.1)

## 2017-07-11 LAB — URINALYSIS, ROUTINE W REFLEX MICROSCOPIC
Bacteria, UA: NONE SEEN
Bilirubin Urine: NEGATIVE
Glucose, UA: NEGATIVE mg/dL
Hgb urine dipstick: NEGATIVE
Ketones, ur: 5 mg/dL — AB
Leukocytes, UA: NEGATIVE
Nitrite: NEGATIVE
Protein, ur: 30 mg/dL — AB
Specific Gravity, Urine: 1.024 (ref 1.005–1.030)
Squamous Epithelial / LPF: NONE SEEN
pH: 5 (ref 5.0–8.0)

## 2017-07-11 LAB — CBC WITH DIFFERENTIAL/PLATELET
Basophils Absolute: 0 10*3/uL (ref 0.0–0.1)
Basophils Relative: 0 %
Eosinophils Absolute: 0 10*3/uL (ref 0.0–0.7)
Eosinophils Relative: 0 %
HCT: 45.6 % (ref 39.0–52.0)
Hemoglobin: 15.7 g/dL (ref 13.0–17.0)
Lymphocytes Relative: 6 %
Lymphs Abs: 0.8 10*3/uL (ref 0.7–4.0)
MCH: 30.9 pg (ref 26.0–34.0)
MCHC: 34.4 g/dL (ref 30.0–36.0)
MCV: 89.8 fL (ref 78.0–100.0)
Monocytes Absolute: 1 10*3/uL (ref 0.1–1.0)
Monocytes Relative: 7 %
Neutro Abs: 12.6 10*3/uL — ABNORMAL HIGH (ref 1.7–7.7)
Neutrophils Relative %: 87 %
Platelets: 203 10*3/uL (ref 150–400)
RBC: 5.08 MIL/uL (ref 4.22–5.81)
RDW: 13.6 % (ref 11.5–15.5)
WBC: 14.4 10*3/uL — ABNORMAL HIGH (ref 4.0–10.5)

## 2017-07-11 LAB — SURGICAL PCR SCREEN
MRSA, PCR: NEGATIVE
Staphylococcus aureus: NEGATIVE

## 2017-07-11 MED ORDER — MORPHINE SULFATE (PF) 4 MG/ML IV SOLN
2.0000 mg | INTRAVENOUS | Status: DC | PRN
Start: 2017-07-11 — End: 2017-07-13

## 2017-07-11 MED ORDER — MORPHINE SULFATE (PF) 4 MG/ML IV SOLN
4.0000 mg | Freq: Once | INTRAVENOUS | Status: DC
  Filled 2017-07-11: qty 1

## 2017-07-11 MED ORDER — SENNOSIDES-DOCUSATE SODIUM 8.6-50 MG PO TABS: 1.0000 | ORAL_TABLET | Freq: Every evening | ORAL | Status: DC | PRN

## 2017-07-11 MED ORDER — MESALAMINE 1.2 G PO TBEC
4.8000 g | DELAYED_RELEASE_TABLET | Freq: Every day | ORAL | Status: DC
  Filled 2017-07-11 (×2): qty 4

## 2017-07-11 MED ORDER — VENLAFAXINE HCL 37.5 MG PO TABS
37.5000 mg | ORAL_TABLET | Freq: Two times a day (BID) | ORAL | Status: DC
  Filled 2017-07-11 (×4): qty 1

## 2017-07-11 MED ORDER — MORPHINE SULFATE (PF) 4 MG/ML IV SOLN
4.0000 mg | Freq: Once | INTRAVENOUS | Status: AC
  Administered 2017-07-11: 4 mg via INTRAVENOUS
  Filled 2017-07-11: qty 1

## 2017-07-11 MED ORDER — ACETAMINOPHEN 650 MG RE SUPP: 650.0000 mg | Freq: Four times a day (QID) | RECTAL | Status: DC | PRN

## 2017-07-11 MED ORDER — IBUPROFEN 400 MG PO TABS: 600.0000 mg | ORAL_TABLET | Freq: Four times a day (QID) | ORAL | Status: DC | PRN

## 2017-07-11 MED ORDER — CEFAZOLIN SODIUM-DEXTROSE 2-4 GM/100ML-% IV SOLN
2.0000 g | INTRAVENOUS | Status: AC
  Administered 2017-07-12: 2 g via INTRAVENOUS

## 2017-07-11 MED ORDER — TESTOSTERONE 50 MG/5GM (1%) TD GEL: 5.0000 g | Freq: Every day | TRANSDERMAL | Status: DC

## 2017-07-11 MED ORDER — METHOCARBAMOL 1000 MG/10ML IJ SOLN: 500.0000 mg | Freq: Once | INTRAMUSCULAR | Status: DC

## 2017-07-11 MED ORDER — SODIUM CHLORIDE 0.9 % IV SOLN
INTRAVENOUS | Status: DC
  Administered 2017-07-12 – 2017-07-13 (×2): via INTRAVENOUS

## 2017-07-11 MED ORDER — ACETAMINOPHEN 325 MG PO TABS: 650.0000 mg | ORAL_TABLET | Freq: Four times a day (QID) | ORAL | Status: DC | PRN

## 2017-07-11 MED ORDER — AMLODIPINE BESYLATE 5 MG PO TABS
2.5000 mg | ORAL_TABLET | Freq: Every day | ORAL | Status: DC
  Administered 2017-07-11 – 2017-07-13 (×3): 2.5 mg via ORAL
  Filled 2017-07-11 (×3): qty 1

## 2017-07-11 MED ORDER — METHOCARBAMOL 1000 MG/10ML IJ SOLN
500.0000 mg | Freq: Once | INTRAVENOUS | Status: AC
  Administered 2017-07-11: 500 mg via INTRAVENOUS
  Filled 2017-07-11: qty 550

## 2017-07-11 MED ORDER — EZETIMIBE 10 MG PO TABS
10.0000 mg | ORAL_TABLET | Freq: Every day | ORAL | Status: DC
  Administered 2017-07-13: 10 mg via ORAL
  Filled 2017-07-11 (×2): qty 1

## 2017-07-11 MED ORDER — SODIUM CHLORIDE 0.9 % IV SOLN
Freq: Once | INTRAVENOUS | Status: AC
  Administered 2017-07-11: 18:00:00 via INTRAVENOUS

## 2017-07-11 MED ORDER — POVIDONE-IODINE 10 % EX SWAB: 2.0000 "application " | Freq: Once | CUTANEOUS | Status: DC

## 2017-07-11 MED ORDER — CHLORHEXIDINE GLUCONATE 4 % EX LIQD
60.0000 mL | Freq: Once | CUTANEOUS | Status: DC
  Administered 2017-07-12: 4 via TOPICAL
  Filled 2017-07-11: qty 60

## 2017-07-11 MED ORDER — HYDROCODONE-ACETAMINOPHEN 5-325 MG PO TABS
1.0000 | ORAL_TABLET | ORAL | Status: DC | PRN
  Administered 2017-07-11 – 2017-07-13 (×6): 2 via ORAL
  Filled 2017-07-11 (×6): qty 2

## 2017-07-11 MED ORDER — ONDANSETRON HCL 4 MG/2ML IJ SOLN
4.0000 mg | Freq: Once | INTRAMUSCULAR | Status: AC
  Administered 2017-07-11: 4 mg via INTRAVENOUS
  Filled 2017-07-11: qty 2

## 2017-07-11 MED ORDER — SODIUM CHLORIDE 0.9 % IV BOLUS (SEPSIS)
1000.0000 mL | Freq: Once | INTRAVENOUS | Status: AC
  Administered 2017-07-11: 1000 mL via INTRAVENOUS

## 2017-07-11 MED ORDER — PANTOPRAZOLE SODIUM 40 MG PO TBEC
40.0000 mg | DELAYED_RELEASE_TABLET | Freq: Every day | ORAL | Status: DC
  Administered 2017-07-11 – 2017-07-13 (×3): 40 mg via ORAL
  Filled 2017-07-11 (×3): qty 1

## 2017-07-11 NOTE — ED Notes (Signed)
Pt wants to hold off on pain medication for now.  States he will let me know when he needs it.
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
## 2017-07-11 NOTE — ED Notes (Signed)
RN at bedside starting IV
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
## 2017-07-11 NOTE — ED Notes (Signed)
Patient transported to X-ray 
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
## 2017-07-12 ENCOUNTER — Encounter (HOSPITAL_COMMUNITY)
Admission: EM | Disposition: A | Discharge status: Home / Self Care | Payer: Self-pay | Source: Home / Self Care | Attending: Internal Medicine

## 2017-07-12 ENCOUNTER — Inpatient Hospital Stay (HOSPITAL_COMMUNITY): Payer: BLUE CROSS/BLUE SHIELD

## 2017-07-12 ENCOUNTER — Encounter (HOSPITAL_COMMUNITY): Payer: Self-pay | Admitting: Anesthesiology

## 2017-07-12 ENCOUNTER — Inpatient Hospital Stay (HOSPITAL_COMMUNITY): Payer: BLUE CROSS/BLUE SHIELD | Admitting: Certified Registered Nurse Anesthetist

## 2017-07-12 DIAGNOSIS — S72002A Fracture of unspecified part of neck of left femur, initial encounter for closed fracture: Secondary | ICD-10-CM | POA: Diagnosis present

## 2017-07-12 HISTORY — PX: HIP PINNING,CANNULATED: SHX1758

## 2017-07-12 LAB — CBC WITH DIFFERENTIAL/PLATELET
Basophils Absolute: 0 10*3/uL (ref 0.0–0.1)
Basophils Relative: 0 %
Eosinophils Absolute: 0.1 10*3/uL (ref 0.0–0.7)
Eosinophils Relative: 1 %
HCT: 41.4 % (ref 39.0–52.0)
Hemoglobin: 13.8 g/dL (ref 13.0–17.0)
Lymphocytes Relative: 17 %
Lymphs Abs: 1.8 10*3/uL (ref 0.7–4.0)
MCH: 30.2 pg (ref 26.0–34.0)
MCHC: 33.3 g/dL (ref 30.0–36.0)
MCV: 90.6 fL (ref 78.0–100.0)
Monocytes Absolute: 1 10*3/uL (ref 0.1–1.0)
Monocytes Relative: 10 %
Neutro Abs: 7.4 10*3/uL (ref 1.7–7.7)
Neutrophils Relative %: 72 %
Platelets: 179 10*3/uL (ref 150–400)
RBC: 4.57 MIL/uL (ref 4.22–5.81)
RDW: 14 % (ref 11.5–15.5)
WBC: 10.3 10*3/uL (ref 4.0–10.5)

## 2017-07-12 LAB — BASIC METABOLIC PANEL
Anion gap: 8 (ref 5–15)
BUN: 19 mg/dL (ref 6–20)
CO2: 27 mmol/L (ref 22–32)
Calcium: 8.7 mg/dL — ABNORMAL LOW (ref 8.9–10.3)
Chloride: 105 mmol/L (ref 101–111)
Creatinine, Ser: 1.35 mg/dL — ABNORMAL HIGH (ref 0.61–1.24)
GFR calc Af Amer: >60 mL/min (ref >60)
GFR calc non Af Amer: 55 mL/min — ABNORMAL LOW (ref >60)
Glucose, Bld: 98 mg/dL (ref 65–99)
Potassium: 4.1 mmol/L (ref 3.5–5.1)
Sodium: 140 mmol/L (ref 135–145)

## 2017-07-12 LAB — MAGNESIUM: Magnesium: 1.9 mg/dL (ref 1.7–2.4)

## 2017-07-12 SURGERY — FIXATION, FEMUR, NECK, PERCUTANEOUS, USING SCREW
Anesthesia: General | Laterality: Left

## 2017-07-12 MED ORDER — FENTANYL CITRATE (PF) 100 MCG/2ML IJ SOLN
INTRAMUSCULAR | Status: DC | PRN
  Administered 2017-07-12: 50 ug via INTRAVENOUS
  Administered 2017-07-12: 100 ug via INTRAVENOUS

## 2017-07-12 MED ORDER — MEPERIDINE HCL 50 MG/ML IJ SOLN: 6.2500 mg | INTRAMUSCULAR | Status: DC | PRN

## 2017-07-12 MED ORDER — ACETAMINOPHEN 10 MG/ML IV SOLN
INTRAVENOUS | Status: AC
  Filled 2017-07-12: qty 100

## 2017-07-12 MED ORDER — LIDOCAINE 2% (20 MG/ML) 5 ML SYRINGE
INTRAMUSCULAR | Status: DC | PRN
  Administered 2017-07-12: 100 mg via INTRAVENOUS

## 2017-07-12 MED ORDER — PROPOFOL 10 MG/ML IV BOLUS
INTRAVENOUS | Status: AC
Start: 2017-07-12 — End: 2017-07-12
  Filled 2017-07-12: qty 20

## 2017-07-12 MED ORDER — FENTANYL CITRATE (PF) 100 MCG/2ML IJ SOLN
INTRAMUSCULAR | Status: AC
  Filled 2017-07-12: qty 2

## 2017-07-12 MED ORDER — EPHEDRINE SULFATE-NACL 50-0.9 MG/10ML-% IV SOSY
PREFILLED_SYRINGE | INTRAVENOUS | Status: DC | PRN
  Administered 2017-07-12: 10 mg via INTRAVENOUS

## 2017-07-12 MED ORDER — MIDAZOLAM HCL 5 MG/5ML IJ SOLN
INTRAMUSCULAR | Status: DC | PRN
  Administered 2017-07-12: 2 mg via INTRAVENOUS

## 2017-07-12 MED ORDER — ACETAMINOPHEN 10 MG/ML IV SOLN
INTRAVENOUS | Status: DC | PRN
  Administered 2017-07-12: 1000 mg via INTRAVENOUS

## 2017-07-12 MED ORDER — ISOPROPYL ALCOHOL 70 % SOLN
Status: DC | PRN
  Administered 2017-07-12: 1 via TOPICAL

## 2017-07-12 MED ORDER — DEXAMETHASONE SODIUM PHOSPHATE 10 MG/ML IJ SOLN
INTRAMUSCULAR | Status: AC
  Filled 2017-07-12: qty 2

## 2017-07-12 MED ORDER — HYDROMORPHONE HCL-NACL 0.5-0.9 MG/ML-% IV SOSY
0.2500 mg | PREFILLED_SYRINGE | INTRAVENOUS | Status: DC | PRN
  Administered 2017-07-12: 0.5 mg via INTRAVENOUS

## 2017-07-12 MED ORDER — ASPIRIN EC 325 MG PO TBEC
325.0000 mg | DELAYED_RELEASE_TABLET | Freq: Two times a day (BID) | ORAL | Status: DC
  Administered 2017-07-12 – 2017-07-13 (×2): 325 mg via ORAL
  Filled 2017-07-12 (×2): qty 1

## 2017-07-12 MED ORDER — CEFAZOLIN SODIUM-DEXTROSE 2-4 GM/100ML-% IV SOLN
INTRAVENOUS | Status: AC
  Filled 2017-07-12: qty 100

## 2017-07-12 MED ORDER — ONDANSETRON HCL 4 MG PO TABS: 4.0000 mg | ORAL_TABLET | Freq: Four times a day (QID) | ORAL | Status: DC | PRN

## 2017-07-12 MED ORDER — SODIUM CHLORIDE 0.9 % IR SOLN
Status: DC | PRN
  Administered 2017-07-12: 1000 mL

## 2017-07-12 MED ORDER — PHENYLEPHRINE 40 MCG/ML (10ML) SYRINGE FOR IV PUSH (FOR BLOOD PRESSURE SUPPORT)
PREFILLED_SYRINGE | INTRAVENOUS | Status: DC | PRN
  Administered 2017-07-12: 80 ug via INTRAVENOUS

## 2017-07-12 MED ORDER — POLYETHYLENE GLYCOL 3350 17 G PO PACK: 17.0000 g | PACK | Freq: Every day | ORAL | Status: DC | PRN

## 2017-07-12 MED ORDER — ONDANSETRON HCL 4 MG/2ML IJ SOLN
INTRAMUSCULAR | Status: DC | PRN
  Administered 2017-07-12: 4 mg via INTRAVENOUS

## 2017-07-12 MED ORDER — MENTHOL 3 MG MT LOZG: 1.0000 | LOZENGE | OROMUCOSAL | Status: DC | PRN

## 2017-07-12 MED ORDER — CEFAZOLIN SODIUM-DEXTROSE 2-4 GM/100ML-% IV SOLN
2.0000 g | Freq: Four times a day (QID) | INTRAVENOUS | Status: AC
  Administered 2017-07-12 – 2017-07-13 (×2): 2 g via INTRAVENOUS
  Filled 2017-07-12 (×2): qty 100

## 2017-07-12 MED ORDER — PROPOFOL 10 MG/ML IV BOLUS
INTRAVENOUS | Status: AC
  Filled 2017-07-12: qty 20

## 2017-07-12 MED ORDER — ONDANSETRON HCL 4 MG/2ML IJ SOLN
INTRAMUSCULAR | Status: AC
  Filled 2017-07-12: qty 2

## 2017-07-12 MED ORDER — METHOCARBAMOL 500 MG PO TABS
500.0000 mg | ORAL_TABLET | Freq: Four times a day (QID) | ORAL | Status: DC | PRN
  Filled 2017-07-12: qty 1

## 2017-07-12 MED ORDER — METOCLOPRAMIDE HCL 5 MG/ML IJ SOLN: 5.0000 mg | Freq: Three times a day (TID) | INTRAMUSCULAR | Status: DC | PRN

## 2017-07-12 MED ORDER — DEXAMETHASONE SODIUM PHOSPHATE 10 MG/ML IJ SOLN
INTRAMUSCULAR | Status: DC | PRN
  Administered 2017-07-12: 10 mg via INTRAVENOUS

## 2017-07-12 MED ORDER — PROMETHAZINE HCL 25 MG/ML IJ SOLN: 6.2500 mg | INTRAMUSCULAR | Status: DC | PRN

## 2017-07-12 MED ORDER — LIDOCAINE 2% (20 MG/ML) 5 ML SYRINGE
INTRAMUSCULAR | Status: AC
  Filled 2017-07-12: qty 5

## 2017-07-12 MED ORDER — ONDANSETRON HCL 4 MG/2ML IJ SOLN: 4.0000 mg | Freq: Four times a day (QID) | INTRAMUSCULAR | Status: DC | PRN

## 2017-07-12 MED ORDER — METHOCARBAMOL 1000 MG/10ML IJ SOLN
500.0000 mg | Freq: Four times a day (QID) | INTRAVENOUS | Status: DC | PRN
  Administered 2017-07-12: 500 mg via INTRAVENOUS
  Filled 2017-07-12: qty 550

## 2017-07-12 MED ORDER — PROPOFOL 10 MG/ML IV BOLUS
INTRAVENOUS | Status: DC | PRN
  Administered 2017-07-12: 170 mg via INTRAVENOUS
  Administered 2017-07-12: 50 mg via INTRAVENOUS

## 2017-07-12 MED ORDER — ISOPROPYL ALCOHOL 70 % SOLN
Status: AC
  Filled 2017-07-12: qty 480

## 2017-07-12 MED ORDER — LACTATED RINGERS IV SOLN
INTRAVENOUS | Status: DC | PRN
  Administered 2017-07-12 (×2): via INTRAVENOUS

## 2017-07-12 MED ORDER — LACTATED RINGERS IV SOLN: INTRAVENOUS | Status: DC

## 2017-07-12 MED ORDER — MIDAZOLAM HCL 2 MG/2ML IJ SOLN
INTRAMUSCULAR | Status: AC
  Filled 2017-07-12: qty 2

## 2017-07-12 MED ORDER — PHENYLEPHRINE 40 MCG/ML (10ML) SYRINGE FOR IV PUSH (FOR BLOOD PRESSURE SUPPORT)
PREFILLED_SYRINGE | INTRAVENOUS | Status: AC
  Filled 2017-07-12: qty 20

## 2017-07-12 MED ORDER — PHENOL 1.4 % MT LIQD: 1.0000 | OROMUCOSAL | Status: DC | PRN

## 2017-07-12 MED ORDER — METOCLOPRAMIDE HCL 5 MG PO TABS: 5.0000 mg | ORAL_TABLET | Freq: Three times a day (TID) | ORAL | Status: DC | PRN

## 2017-07-12 MED ORDER — HYDROMORPHONE HCL-NACL 0.5-0.9 MG/ML-% IV SOSY
PREFILLED_SYRINGE | INTRAVENOUS | Status: AC
  Filled 2017-07-12: qty 2

## 2017-07-12 SURGICAL SUPPLY — 39 items
ADH SKN CLS APL DERMABOND .7 (GAUZE/BANDAGES/DRESSINGS) ×1
BAG SPEC THK2 15X12 ZIP CLS (MISCELLANEOUS)
BAG ZIPLOCK 12X15 (MISCELLANEOUS) IMPLANT
BIT DRILL 4.8X200 CANN (BIT) ×1 IMPLANT
CHLORAPREP W/TINT 26ML (MISCELLANEOUS) ×2 IMPLANT
COVER PERINEAL POST (MISCELLANEOUS) ×2 IMPLANT
COVER SURGICAL LIGHT HANDLE (MISCELLANEOUS) ×2 IMPLANT
DERMABOND ADVANCED (GAUZE/BANDAGES/DRESSINGS) ×1
DERMABOND ADVANCED .7 DNX12 (GAUZE/BANDAGES/DRESSINGS) ×2 IMPLANT
DRAPE C-ARM 42X120 X-RAY (DRAPES) ×2 IMPLANT
DRAPE C-ARMOR (DRAPES) ×2 IMPLANT
DRAPE SHEET LG 3/4 BI-LAMINATE (DRAPES) ×2 IMPLANT
DRAPE STERI IOBAN 125X83 (DRAPES) ×2 IMPLANT
DRAPE U-SHAPE 47X51 STRL (DRAPES) ×4 IMPLANT
DRSG MEPILEX BORDER 4X4 (GAUZE/BANDAGES/DRESSINGS) ×3 IMPLANT
DRSG MEPILEX BORDER 4X8 (GAUZE/BANDAGES/DRESSINGS) IMPLANT
ELECT BLADE TIP CTD 4 INCH (ELECTRODE) IMPLANT
FACESHIELD WRAPAROUND (MASK) ×2 IMPLANT
FACESHIELD WRAPAROUND OR TEAM (MASK) ×1 IMPLANT
GAUZE SPONGE 4X4 12PLY STRL (GAUZE/BANDAGES/DRESSINGS) ×2 IMPLANT
GLOVE BIO SURGEON STRL SZ8.5 (GLOVE) ×4 IMPLANT
GLOVE BIOGEL PI IND STRL 8.5 (GLOVE) ×1 IMPLANT
GLOVE BIOGEL PI INDICATOR 8.5 (GLOVE) ×1
GOWN SPEC L3 XXLG W/TWL (GOWN DISPOSABLE) ×2 IMPLANT
KIT BASIN OR (CUSTOM PROCEDURE TRAY) ×2 IMPLANT
MANIFOLD NEPTUNE II (INSTRUMENTS) ×2 IMPLANT
MARKER SKIN DUAL TIP RULER LAB (MISCELLANEOUS) ×2 IMPLANT
PACK TOTAL JOINT WL LF (CUSTOM PROCEDURE TRAY) ×2 IMPLANT
PIN GUIDE DRILL TIP 2.8X300 (DRILL) ×4 IMPLANT
SCREW 8.0X80MMX16 (Screw) ×1 IMPLANT
SCREW CANN 8.0X85 HIP (Screw) ×1 IMPLANT
SCREW CANNULATED 8.0X75MM (Screw) ×1 IMPLANT
SUT MNCRL AB 3-0 PS2 18 (SUTURE) ×2 IMPLANT
SUT MON AB 2-0 SH 27 (SUTURE) ×2
SUT MON AB 2-0 SH27 (SUTURE) IMPLANT
SUT VIC AB 0 CT1 27 (SUTURE) ×2
SUT VIC AB 0 CT1 27XBRD ANTBC (SUTURE) IMPLANT
SUT VIC AB 1 CT1 36 (SUTURE) ×2 IMPLANT
YANKAUER SUCT BULB TIP NO VENT (SUCTIONS) IMPLANT

## 2017-07-12 NOTE — Anesthesia Postprocedure Evaluation (Signed)
Anesthesia Post Note  Patient: Tyler Gates  Procedure(s) Performed: Procedure(s) (LRB): CANNULATED HIP PINNING LEFT (Left)     Patient location during evaluation: PACU Anesthesia Type: General Level of consciousness: awake and alert Pain management: pain level controlled Vital Signs Assessment: post-procedure vital signs reviewed and stable Respiratory status: spontaneous breathing, nonlabored ventilation, respiratory function stable and patient connected to nasal cannula oxygen Cardiovascular status: blood pressure returned to baseline and stable Postop Assessment: no signs of nausea or vomiting Anesthetic complications: no    Last Vitals:  Vitals:   07/12/17 1240 07/12/17 1245  BP: (!) 140/99 (!) 138/98  Pulse: 84 82  Resp: 12 10  Temp:      Last Pain:  Vitals:   07/12/17 1240  TempSrc:   PainSc: Asleep                 Effie Berkshire

## 2017-07-12 NOTE — Discharge Instructions (Signed)
Dr. Rod Can Adult Hip & Knee Specialist [facility] [address], [place], [place] [address] [phone]   POSTOPERATIVE DIRECTIONS    Hip Rehabilitation, Guidelines Following Surgery   WEIGHT BEARING Partial weight bearing with assist device as directed.  touch down weight bearing left leg   HOME CARE INSTRUCTIONS  Remove items at home which could result in a fall. This includes throw rugs or furniture in walking pathways.  Continue medications as instructed at time of discharge.  You may have some home medications which will be placed on hold until you complete the course of blood thinner medication.  4 days after discharge, you may start showering. No tub baths or soaking your incisions. Do not put on socks or shoes without following the instructions of your caregivers.   Sit on chairs with arms. Use the chair arms to help push yourself up when arising.  Arrange for the use of a toilet seat elevator so you are not sitting low.   Walk with walker as instructed.  You may resume a sexual relationship in one month or when given the OK by your caregiver.  Use walker as long as suggested by your caregivers.  Avoid periods of inactivity such as sitting longer than an hour when not asleep. This helps prevent blood clots.  You may return to work once you are cleared by Engineer, production.  Do not drive a car for 6 weeks or until released by your surgeon.  Do not drive while taking narcotics.  Wear elastic stockings for two weeks following surgery during the day but you may remove then at night.  Make sure you keep all of your appointments after your operation with all of your doctors and caregivers. You should call the office at the above phone number and make an appointment for approximately two weeks after the date of your surgery. Please pick up a stool softener and laxative for home use as long as you are requiring pain medications.  ICE to the  affected hip every three hours for 30 minutes at a time and then as needed for pain and swelling. Continue to use ice on the hip for pain and swelling from surgery. You may notice swelling that will progress down to the foot and ankle.  This is normal after surgery.  Elevate the leg when you are not up walking on it.   It is important for you to complete the blood thinner medication as prescribed by your doctor.  Continue to use the breathing machine which will help keep your temperature down.  It is common for your temperature to cycle up and down following surgery, especially at night when you are not up moving around and exerting yourself.  The breathing machine keeps your lungs expanded and your temperature down.  RANGE OF MOTION AND STRENGTHENING EXERCISES  These exercises are designed to help you keep full movement of your hip joint. Follow your caregiver's or physical therapist's instructions. Perform all exercises about fifteen times, three times per day or as directed. Exercise both hips, even if you have had only one joint replacement. These exercises can be done on a training (exercise) mat, on the floor, on a table or on a bed. Use whatever works the best and is most comfortable for you. Use music or television while you are exercising so that the exercises are a pleasant break in your day. This will make your life better with the exercises acting as a break in routine  you can look forward to.  Lying on your back, slowly slide your foot toward your buttocks, raising your knee up off the floor. Then slowly slide your foot back down until your leg is straight again.  Lying on your back spread your legs as far apart as you can without causing discomfort.  Lying on your side, raise your upper leg and foot straight up from the floor as far as is comfortable. Slowly lower the leg and repeat.  Lying on your back, tighten up the muscle in the front of your thigh (quadriceps muscles). You can do this by  keeping your leg straight and trying to raise your heel off the floor. This helps strengthen the largest muscle supporting your knee.  Lying on your back, tighten up the muscles of your buttocks both with the legs straight and with the knee bent at a comfortable angle while keeping your heel on the floor.   SKILLED REHAB INSTRUCTIONS: If the patient is transferred to a skilled rehab facility following release from the hospital, a list of the current medications will be sent to the facility for the patient to continue.  When discharged from the skilled rehab facility, please have the facility set up the patient's [place] prior to being released. Also, the skilled facility will be responsible for providing the patient with their medications at time of release from the facility to include their pain medication and their blood thinner medication. If the patient is still at the rehab facility at time of the two week follow up appointment, the skilled rehab facility will also need to assist the patient in arranging follow up appointment in our office and any transportation needs.  MAKE SURE YOU:  Understand these instructions.  Will watch your condition.  Will get help right away if you are not doing well or get worse.  Pick up stool softner and laxative for home use following surgery while on pain medications. Daily dry dressing changes as needed. In 4 days, you may remove your dressings and begin taking showers - no tub baths or soaking the incisions. Continue to use ice for pain and swelling after surgery. Do not use any lotions or creams on the incision until instructed by your surgeon.

## 2017-07-12 NOTE — Interval H&P Note (Signed)
History and Physical Interval Note:  07/12/2017 11:08 AM  Tyler Gates  has presented today for surgery, with the diagnosis of left femoral neck fracture  The various methods of treatment have been discussed with the patient and family. After consideration of risks, benefits and other options for treatment, the patient has consented to  Procedure(s): CANNULATED HIP PINNING LEFT (Left) as a surgical intervention .  The patient's history has been reviewed, patient examined, no change in status, stable for surgery.  I have reviewed the patient's chart and labs.  Questions were answered to the patient's satisfaction.    The risks, benefits, and alternatives were discussed with the patient. There are risks associated with the surgery including, but not limited to, problems with anesthesia (death), infection, differences in leg length/angulation/rotation, fracture of bones, loosening or failure of implants, malunion, nonunion, hematoma (blood accumulation) which may require surgical drainage, blood clots, pulmonary embolism, nerve injury (foot drop), and blood vessel injury. The patient understands these risks and elects to proceed.    Henrique Parekh, Horald Pollen

## 2017-07-12 NOTE — Progress Notes (Signed)
Patient ID: Tyler Gates, male   DOB: Jan 18, 1956, 61 y.o.   MRN: [id number]  PROGRESS NOTE    Tyler Gates  INO:[id number] DOB: 1956/10/02 DOA: 07/11/2017 PCP: Sharilyn Sites, MD   Brief Narrative:  61 y.o. male with medical history significant of hypertension, dyslipidemia, Crohn's disease, depression, GERD presented with fall and left hip pain. He was found to have left femoral neck fracture. He is supposed to have surgical intervention by orthopedics today.   Assessment & Plan:   Principal Problem:   Left displaced femoral neck fracture (HCC) Active Problems:   Hyperlipidemia   Essential hypertension   Crohn's disease ([name])    Left femoral neck fracture slightly displaced - Patient is planned for surgical intervention by orthopedics today - Nothing by mouth. - Pain management - PT/OT evaluation postprocedure once cleared by orthopedics.  Hypertension - Monitor blood pressure. Continue with amlodipine  History of Crohn's disease - Continue mesalamine  Dyslipidemia - Continue ezetimibe  Leukocytosis - Probably reactive. Resolved.    DVT prophylaxis: SCDs Code Status:  Full Family Communication: Spoke to daughter present at bedside Disposition Plan: Depends on surgical outcome. Patient might need rehabilitation placement  Consultants: Orthopedics  Procedures: None  Antimicrobials: None    Subjective: Patient seen and examined at bedside. He denies any overnight fever, nausea or vomiting. His pain is well controlled.  Objective: Vitals:   07/11/17 1603 07/11/17 1834 07/11/17 2151 07/12/17 0602  BP: (!) 146/95 (!) 158/82 140/86 135/72  Pulse: 60 60 66 63  Resp: 18 16 16 15   Temp:  97.6 F (36.4 C) 97.7 F (36.5 C) 98.1 F (36.7 C)  TempSrc:  Oral Oral Oral  SpO2: 100% 96% 96% 95%  Weight:  81.6 kg (180 lb)    Height:  5' 9"  (1.753 m)      Intake/Output Summary (Last 24 hours) at 07/12/17 0925 Last data filed at 07/12/17 0603  Gross per  24 hour  Intake             2430 ml  Output              625 ml  Net             1805 ml   Filed Weights   07/11/17 1834  Weight: 81.6 kg (180 lb)    Examination:  General exam: Appears calm and comfortable  Respiratory system: Bilateral decreased breath sound at bases Cardiovascular system: S1 & S2 heard,Rate controlled  Gastrointestinal system: Abdomen is nondistended, soft and nontender. Normal bowel sounds heard. Extremities: No cyanosis, clubbing, edema   Data Reviewed: I have personally reviewed following labs and imaging studies  CBC:  Recent Labs Lab 07/11/17 1512 07/12/17 0503  WBC 14.4* 10.3  NEUTROABS 12.6* 7.4  HGB 15.7 13.8  HCT 45.6 41.4  MCV 89.8 90.6  PLT 203 096   Basic Metabolic Panel:  Recent Labs Lab 07/11/17 1512 07/12/17 0503  NA 138 140  K 4.2 4.1  CL 102 105  CO2 27 27  GLUCOSE 130* 98  BUN 20 19  CREATININE 1.49* 1.35*  CALCIUM 9.4 8.7*  MG  --  1.9   GFR: Estimated Creatinine Clearance: 57.5 mL/min (A) (by C-G formula based on SCr of 1.35 mg/dL (H)). Liver Function Tests:  Recent Labs Lab 07/11/17 1512  AST 29  ALT 27  ALKPHOS 59  BILITOT 0.9  PROT 8.0  ALBUMIN 4.2   No results for input(s): LIPASE, AMYLASE in the last 168 hours.  No results for input(s): AMMONIA in the last 168 hours. Coagulation Profile: No results for input(s): INR, PROTIME in the last 168 hours. Cardiac Enzymes: No results for input(s): CKTOTAL, CKMB, CKMBINDEX, TROPONINI in the last 168 hours. BNP (last 3 results) No results for input(s): PROBNP in the last 8760 hours. HbA1C: No results for input(s): HGBA1C in the last 72 hours. CBG: No results for input(s): GLUCAP in the last 168 hours. Lipid Profile: No results for input(s): CHOL, HDL, LDLCALC, TRIG, CHOLHDL, LDLDIRECT in the last 72 hours. Thyroid Function Tests: No results for input(s): TSH, T4TOTAL, FREET4, T3FREE, THYROIDAB in the last 72 hours. Anemia Panel: No results for input(s):  VITAMINB12, FOLATE, FERRITIN, TIBC, IRON, RETICCTPCT in the last 72 hours. Sepsis Labs: No results for input(s): PROCALCITON, LATICACIDVEN in the last 168 hours.  Recent Results (from the past 240 hour(s))  Surgical PCR screen     Status: None   Collection Time: 07/11/17  7:54 PM  Result Value Ref Range Status   MRSA, PCR NEGATIVE NEGATIVE Final   Staphylococcus aureus NEGATIVE NEGATIVE Final    Comment:        The Xpert SA Assay (FDA approved for NASAL specimens in patients over 31 years of age), is one component of a comprehensive surveillance program.  Test performance has been validated by [facility] for patients greater than or equal to 36 year old. It is not intended to diagnose infection nor to guide or monitor treatment.          Radiology Studies: Dg Hip Unilat With Pelvis 2-3 Views Left  Result Date: 07/11/2017 CLINICAL DATA:  Fall. EXAM: DG HIP (WITH OR WITHOUT PELVIS) 2-3V LEFT COMPARISON:  No recent prior . FINDINGS: Fracture of the left femoral neck is present. Fracture slightly displaced. Spina bifida occulta L5. Pelvic calcifications consistent phleboliths. No other focal abnormality identified. IMPRESSION: Left femoral neck fracture.  Slight displacement. Electronically Signed   By: Marcello Moores  Register   On: 07/11/2017 15:04        Scheduled Meds: . amLODipine  2.5 mg Oral Daily  . chlorhexidine  60 mL Topical Once  . ezetimibe  10 mg Oral Daily  . mesalamine  4.8 g Oral Q breakfast  .  morphine injection  4 mg Intravenous Once  . pantoprazole  40 mg Oral Daily  . povidone-iodine  2 application Topical Once  . testosterone  5 g Transdermal Daily  . venlafaxine  37.5 mg Oral BID   Continuous Infusions: . sodium chloride 100 mL/hr at 07/12/17 0245  .  ceFAZolin (ANCEF) IV       LOS: 1 day        Aline August, MD [facility] Pager [phone]  If 7PM-7AM, please contact night-coverage [email or web address] Password [facility] 07/12/2017, 9:26  AM

## 2017-07-12 NOTE — Transfer of Care (Signed)
Immediate Anesthesia Transfer of Care Note  Patient: Tyler Gates  Procedure(s) Performed: Procedure(s): CANNULATED HIP PINNING LEFT (Left)  Patient Location: PACU  Anesthesia Type:General  Level of Consciousness:  sedated, patient cooperative and responds to stimulation  Airway & Oxygen Therapy:Patient Spontanous Breathing and Patient connected to face mask oxgen  Post-op Assessment:  Report given to PACU RN and Post -op Vital signs reviewed and stable  Post vital signs:  Reviewed and stable  Last Vitals:  Vitals:   07/11/17 2151 07/12/17 0602  BP: 140/86 135/72  Pulse: 66 63  Resp: 16 15  Temp: 36.5 C 27.8 C    Complications: No apparent anesthesia complications

## 2017-07-12 NOTE — Op Note (Signed)
OPERATIVE REPORT  SURGEON: Rod Can, MD   ASSISTANT: Staff.  PREOPERATIVE DIAGNOSIS: Left femoral neck fracture.   POSTOPERATIVE DIAGNOSIS: Left femoral neck fracture.   PROCEDURE: Percutaneous screw fixation, Left femoral neck.   IMPLANTS: Biomet 8.0 mm cannulated screws 3.  ANESTHESIA:  General  ESTIMATED BLOOD LOSS:-* No blood loss amount entered *    ANTIBIOTICS: 2 g Ancef.  DRAINS: None.  COMPLICATIONS: None.   CONDITION: PACU - hemodynamically stable.Marland Kitchen   BRIEF CLINICAL NOTE: RAM HAUGAN is a 61 y.o. male who presented with a minimally displaced femoral neck fracture. The patient was admitted to the hospitalist service and underwent perioperative risk stratification and medical optimization. The risks, benefits, and alternatives to the procedure were explained, and the patient elected to proceed.  PROCEDURE IN DETAIL: Surgical site was marked by myself. The patient was taken to the operating room and anesthesia was induced on the bed. The patient was then transferred to the [facility] table and the nonoperative lower extremity was scissored underneath the operative side. The hip was prepped and draped in the normal sterile surgical fashion. Timeout was called verifying side and site of surgery. Preop antibiotics were given with 60 minutes of beginning the procedure.  Fluoroscopy was used to define the patient's anatomy. A 2 cm incision was made over the lateral aspect of the proximal femur. Guidepin was placed inferiorly on the AP x-ray, and centrally on the lateral x-ray. 2 additional guide pins were placed proximal to the first pin, one anterior and one posterior. Pin position was confirmed with biplanar fluoroscopy. The pins were sequentially measured, the near cortex was drilled, and the screws were placed. Final AP and lateral fluoroscopy views were obtained to confirm fracture reduction and hardware placement. There was no chondral penetration.  The wounds were  copiously irrigated with saline. The wound was closed in layers with #1 Vicryl for the fascia, 2-0 Monocryl for the deep dermal layer, and 3-0 Monocryl subcuticular stitch. Glue was applied to the skin. Once the glue was fully hardened, sterile dressing was applied. The patient was then awakened from anesthesia and taken to the PACU in stable condition. Sponge needle and instrument counts were correct at the end of the case 2. There were no known complications.  We will readmit the patient to the hospitalist. Weightbearing status will be touchdown weightbearing with a walker. We will begin ASA for DVT prophylaxis. The patient will work with physical therapy and undergo disposition planning.

## 2017-07-12 NOTE — Anesthesia Procedure Notes (Signed)
Procedure Name: LMA Insertion Date/Time: 07/12/2017 11:26 AM Performed by: Montel Clock Pre-anesthesia Checklist: Patient identified, Emergency Drugs available, Suction available, Patient being monitored and Timeout performed Patient Re-evaluated:Patient Re-evaluated prior to induction Oxygen Delivery Method: Circle system utilized Preoxygenation: Pre-oxygenation with 100% oxygen Induction Type: IV induction Ventilation: Mask ventilation without difficulty LMA: LMA with gastric port inserted LMA Size: 4.0 Number of attempts: 1 Dental Injury: Teeth and Oropharynx as per pre-operative assessment
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
## 2017-07-12 NOTE — Anesthesia Preprocedure Evaluation (Addendum)
Anesthesia Evaluation  Patient identified by MRN, date of birth, ID band Patient awake    Reviewed: Allergy & Precautions, NPO status , Patient's Chart, lab work & pertinent test results  Airway Mallampati: II       Dental  (+) Teeth Intact, Dental Advisory Given   Pulmonary sleep apnea ,    Pulmonary exam normal        Cardiovascular hypertension, Pt. on medications + dysrhythmias  Rhythm:Regular Rate:Normal     Neuro/Psych PSYCHIATRIC DISORDERS Depression  Neuromuscular disease    GI/Hepatic hiatal hernia, GERD  Medicated,  Endo/Other  negative endocrine ROS  Renal/GU negative Renal ROS     Musculoskeletal  (+) Arthritis , Osteoarthritis,    Abdominal   Peds  Hematology negative hematology ROS (+)   Anesthesia Other Findings   Reproductive/Obstetrics negative OB ROS                            Anesthesia Physical Anesthesia Plan  ASA: II  Anesthesia Plan: General   Post-op Pain Management:    Induction:   PONV Risk Score and Plan: 3 and Ondansetron, Dexamethasone, Propofol and Midazolam  Airway Management Planned: LMA  Additional Equipment:   Intra-op Plan:   Post-operative Plan: Extubation in OR  Informed Consent: I have reviewed the patients History and Physical, chart, labs and discussed the procedure including the risks, benefits and alternatives for the proposed anesthesia with the patient or authorized representative who has indicated his/her understanding and acceptance.   Dental advisory given  Plan Discussed with: CRNA  Anesthesia Plan Comments:        Anesthesia Quick Evaluation

## 2017-07-13 LAB — CBC WITH DIFFERENTIAL/PLATELET
Basophils Absolute: 0 10*3/uL (ref 0.0–0.1)
Basophils Relative: 0 %
Eosinophils Absolute: 0 10*3/uL (ref 0.0–0.7)
Eosinophils Relative: 0 %
HCT: 38.6 % — ABNORMAL LOW (ref 39.0–52.0)
Hemoglobin: 12.8 g/dL — ABNORMAL LOW (ref 13.0–17.0)
Lymphocytes Relative: 7 %
Lymphs Abs: 0.9 10*3/uL (ref 0.7–4.0)
MCH: 30.3 pg (ref 26.0–34.0)
MCHC: 33.2 g/dL (ref 30.0–36.0)
MCV: 91.5 fL (ref 78.0–100.0)
Monocytes Absolute: 1.1 10*3/uL — ABNORMAL HIGH (ref 0.1–1.0)
Monocytes Relative: 8 %
Neutro Abs: 11.3 10*3/uL — ABNORMAL HIGH (ref 1.7–7.7)
Neutrophils Relative %: 85 %
Platelets: 187 10*3/uL (ref 150–400)
RBC: 4.22 MIL/uL (ref 4.22–5.81)
RDW: 14 % (ref 11.5–15.5)
WBC: 13.3 10*3/uL — ABNORMAL HIGH (ref 4.0–10.5)

## 2017-07-13 LAB — BASIC METABOLIC PANEL
Anion gap: 6 (ref 5–15)
BUN: 16 mg/dL (ref 6–20)
CO2: 29 mmol/L (ref 22–32)
Calcium: 8.7 mg/dL — ABNORMAL LOW (ref 8.9–10.3)
Chloride: 105 mmol/L (ref 101–111)
Creatinine, Ser: 1.32 mg/dL — ABNORMAL HIGH (ref 0.61–1.24)
GFR calc Af Amer: >60 mL/min (ref >60)
GFR calc non Af Amer: 57 mL/min — ABNORMAL LOW (ref >60)
Glucose, Bld: 124 mg/dL — ABNORMAL HIGH (ref 65–99)
Potassium: 4.3 mmol/L (ref 3.5–5.1)
Sodium: 140 mmol/L (ref 135–145)

## 2017-07-13 LAB — MAGNESIUM: Magnesium: 2.1 mg/dL (ref 1.7–2.4)

## 2017-07-13 MED ORDER — SENNOSIDES-DOCUSATE SODIUM 8.6-50 MG PO TABS: 1.0000 | ORAL_TABLET | Freq: Every evening | ORAL | 0 refills | Status: DC | PRN

## 2017-07-13 MED ORDER — HYDROCODONE-ACETAMINOPHEN 5-325 MG PO TABS: 1.0000 | ORAL_TABLET | ORAL | 0 refills | Status: DC | PRN

## 2017-07-13 MED ORDER — AMLODIPINE BESYLATE 2.5 MG PO TABS: 2.5000 mg | ORAL_TABLET | Freq: Every day | ORAL | 0 refills | Status: DC

## 2017-07-13 MED ORDER — ASPIRIN 325 MG PO TBEC
325.0000 mg | DELAYED_RELEASE_TABLET | Freq: Two times a day (BID) | ORAL | 0 refills | Status: DC
Start: 2017-07-13 — End: 2017-07-29
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
## 2017-07-13 NOTE — Progress Notes (Signed)
Patient ID: Tyler Gates, male   DOB: 1956-12-13, 61 y.o.   MRN: [id number]  PROGRESS NOTE    AMARIAN BOTERO  TKP:[id number] DOB: 03-13-1956 DOA: 07/11/2017 PCP: Sharilyn Sites, MD   Brief Narrative:  61 y.o.malewith medical history significant of hypertension, dyslipidemia, Crohn's disease, depression, GERD presented with fall and left hip pain. He was found to have left femoral neck fracture. He had surgical intervention done by orthopedics on 07/12/2017    Assessment & Plan:   Principal Problem:   Left displaced femoral neck fracture (HCC) Active Problems:   Hyperlipidemia   Essential hypertension   Crohn's disease ([place])   Fracture of femoral neck, left (HCC)  Left femoral neck fracture slightly displaced - Status post surgical intervention by orthopedics on 07/12/2017 - Follow further recommendations by orthopedics. Weightbearing and wound care as per orthopedics recommendations - PT/OT evaluation  - Aspirin 325 mg twice a day for DVT prophylaxis as per orthopedics - Incentive spirometry  Hypertension - Monitor blood pressure. Continue with amlodipine  History of Crohn's disease - Continue mesalamine  Dyslipidemia - Continue ezetimibe  Leukocytosis - Probably reactive. Repeat a.m. Labs  Chronic kidney disease probably stage III - Creatinine stable. Avoid NSAIDs like Motrin as an outpatient   DVT prophylaxis: SCDs and aspirin 325 mg twice a day Code Status:  Full Family Communication:  none at bedside Disposition Plan:  discharge in 1-2 days. Awaiting PT recommendations Consultants: Orthopedics  Procedures:  left femoral neck fracture surgical fixation on 07/12/2017  Antimicrobials: None     Subjective: Patient seen and examined at bedside. He denies any overnight fever, nausea or vomiting. His pain is controlled.  Objective: Vitals:   07/12/17 1716 07/12/17 2121 07/13/17 0041 07/13/17 0414  BP: 128/81 (!) 154/79 127/79 131/68  Pulse: 73  72 65 (!) 58  Resp: 15 16 15 16   Temp: 98.1 F (36.7 C) 98.3 F (36.8 C) 98.4 F (36.9 C) 98 F (36.7 C)  TempSrc: Oral Oral Oral Oral  SpO2: 96% 95% 95% 94%  Weight:      Height:        Intake/Output Summary (Last 24 hours) at 07/13/17 0904 Last data filed at 07/13/17 5170  Gross per 24 hour  Intake          4192.27 ml  Output             2600 ml  Net          1592.27 ml   Filed Weights   07/11/17 1834  Weight: 81.6 kg (180 lb)    Examination:  General exam: Appears calm and comfortable  Respiratory system: Bilateral decreased breath sound at bases Cardiovascular system: S1 & S2 heard, Rate controlled  Gastrointestinal system: Abdomen is nondistended, soft and nontender. Normal bowel sounds heard. Extremities: No cyanosis, clubbing, edema   Data Reviewed: I have personally reviewed following labs and imaging studies  CBC:  Recent Labs Lab 07/11/17 1512 07/12/17 0503 07/13/17 0522  WBC 14.4* 10.3 13.3*  NEUTROABS 12.6* 7.4 11.3*  HGB 15.7 13.8 12.8*  HCT 45.6 41.4 38.6*  MCV 89.8 90.6 91.5  PLT 203 179 017   Basic Metabolic Panel:  Recent Labs Lab 07/11/17 1512 07/12/17 0503 07/13/17 0522  NA 138 140 140  K 4.2 4.1 4.3  CL 102 105 105  CO2 27 27 29   GLUCOSE 130* 98 124*  BUN 20 19 16   CREATININE 1.49* 1.35* 1.32*  CALCIUM 9.4 8.7* 8.7*  MG  --  1.9 2.1   GFR: Estimated Creatinine Clearance: 58.8 mL/min (A) (by C-G formula based on SCr of 1.32 mg/dL (H)). Liver Function Tests:  Recent Labs Lab 07/11/17 1512  AST 29  ALT 27  ALKPHOS 59  BILITOT 0.9  PROT 8.0  ALBUMIN 4.2   No results for input(s): LIPASE, AMYLASE in the last 168 hours. No results for input(s): AMMONIA in the last 168 hours. Coagulation Profile: No results for input(s): INR, PROTIME in the last 168 hours. Cardiac Enzymes: No results for input(s): CKTOTAL, CKMB, CKMBINDEX, TROPONINI in the last 168 hours. BNP (last 3 results) No results for input(s): PROBNP in the  last 8760 hours. HbA1C: No results for input(s): HGBA1C in the last 72 hours. CBG: No results for input(s): GLUCAP in the last 168 hours. Lipid Profile: No results for input(s): CHOL, HDL, LDLCALC, TRIG, CHOLHDL, LDLDIRECT in the last 72 hours. Thyroid Function Tests: No results for input(s): TSH, T4TOTAL, FREET4, T3FREE, THYROIDAB in the last 72 hours. Anemia Panel: No results for input(s): VITAMINB12, FOLATE, FERRITIN, TIBC, IRON, RETICCTPCT in the last 72 hours. Sepsis Labs: No results for input(s): PROCALCITON, LATICACIDVEN in the last 168 hours.  Recent Results (from the past 240 hour(s))  Surgical PCR screen     Status: None   Collection Time: 07/11/17  7:54 PM  Result Value Ref Range Status   MRSA, PCR NEGATIVE NEGATIVE Final   Staphylococcus aureus NEGATIVE NEGATIVE Final    Comment:        The Xpert SA Assay (FDA approved for NASAL specimens in patients over 79 years of age), is one component of a comprehensive surveillance program.  Test performance has been validated by [facility] for patients greater than or equal to 32 year old. It is not intended to diagnose infection nor to guide or monitor treatment.          Radiology Studies: Pelvis Portable  Result Date: 07/12/2017 CLINICAL DATA:  Post LEFT hip pinning EXAM: PORTABLE PELVIS 1-2 VIEWS COMPARISON:  Portable exam 1254 hours compared earlier intraoperative images of 07/12/2017 and preoperative images of 07/11/2017 FINDINGS: Three cannulated screws have been placed across a nondisplaced fracture of the LEFT femoral neck. No dislocation. Hip and SI joints preserved. No additional focal bony abnormality seen. IMPRESSION: Post pinning of LEFT femoral neck fracture. Electronically Signed   By: Lavonia Dana M.D.   On: 07/12/2017 13:14   Dg C-arm 1-60 Min-no Report  Result Date: 07/12/2017 Fluoroscopy was utilized by the requesting physician.  No radiographic interpretation.   Dg Hip Operative Unilat W Or W/o  Pelvis Left  Result Date: 07/12/2017 CLINICAL DATA:  LEFT femoral neck fracture, LEFT hip pinning EXAM: OPERATIVE LEFT HIP (WITH PELVIS IF PERFORMED) 2 VIEWS TECHNIQUE: Fluoroscopic spot image(s) were submitted for interpretation post-operatively. COMPARISON:  07/11/2017 FLUOROSCOPY TIME:  1 minutes 30 seconds new line exposure: 18.25 mGy Images obtained:  2 FINDINGS: Three cannulated screws were placed across a nondisplaced fracture of the LEFT femoral neck. No dislocation or additional focal bony abnormality identified. IMPRESSION: Post pinning of a nondisplaced LEFT femoral neck fracture. Electronically Signed   By: Lavonia Dana M.D.   On: 07/12/2017 13:16   Dg Hip Unilat With Pelvis 2-3 Views Left  Result Date: 07/11/2017 CLINICAL DATA:  Fall. EXAM: DG HIP (WITH OR WITHOUT PELVIS) 2-3V LEFT COMPARISON:  No recent prior . FINDINGS: Fracture of the left femoral neck is present. Fracture slightly displaced. Spina bifida occulta L5. Pelvic calcifications consistent phleboliths. No other focal abnormality  identified. IMPRESSION: Left femoral neck fracture.  Slight displacement. Electronically Signed   By: Marcello Moores  Register   On: 07/11/2017 15:04        Scheduled Meds: . amLODipine  2.5 mg Oral Daily  . aspirin EC  325 mg Oral BID WC  . ezetimibe  10 mg Oral Daily  . mesalamine  4.8 g Oral Q breakfast  .  morphine injection  4 mg Intravenous Once  . pantoprazole  40 mg Oral Daily  . testosterone  5 g Transdermal Daily  . venlafaxine  37.5 mg Oral BID   Continuous Infusions: . sodium chloride 100 mL/hr at 07/13/17 0011  . methocarbamol (ROBAXIN)  IV Stopped (07/12/17 1327)     LOS: 2 days        Aline August, MD [facility] Pager [phone]  If 7PM-7AM, please contact night-coverage [email or web address] Password [facility] 07/13/2017, 9:04 AM

## 2017-07-13 NOTE — Progress Notes (Signed)
Subjective: 1 Day Post-Op Procedure(s) (LRB): CANNULATED HIP PINNING LEFT (Left) Patient reports pain as 1 on 0-10 scale.  Up in a chair and doin very well. Dressing dry. Touch down wght. Bearing. Awaiting Med clearance to be DCd. Okay from Ortho standpoint.  Objective: Vital signs in last 24 hours: Temp:  [97.7 F (36.5 C)-98.4 F (36.9 C)] 98 F (36.7 C) (07/22 0414) Pulse Rate:  [58-84] 58 (07/22 0414) Resp:  [10-16] 16 (07/22 0414) BP: (127-155)/(68-99) 131/68 (07/22 0414) SpO2:  [94 %-98 %] 94 % (07/22 0414)  Intake/Output from previous day: 07/21 0701 - 07/22 0700 In: 4192.3 [P.O.:600; I.V.:3392.3; IV Piggyback:200] Out: 2600 [Urine:2575; Blood:25] Intake/Output this shift: Total I/O In: 360 [P.O.:360] Out: -    Recent Labs  07/11/17 1512 07/12/17 0503 07/13/17 0522  HGB 15.7 13.8 12.8*    Recent Labs  07/12/17 0503 07/13/17 0522  WBC 10.3 13.3*  RBC 4.57 4.22  HCT 41.4 38.6*  PLT 179 187    Recent Labs  07/12/17 0503 07/13/17 0522  NA 140 140  K 4.1 4.3  CL 105 105  CO2 27 29  BUN 19 16  CREATININE 1.35* 1.32*  GLUCOSE 98 124*  CALCIUM 8.7* 8.7*   No results for input(s): LABPT, INR in the last 72 hours.  Dorsiflexion/Plantar flexion intact Compartment soft  Assessment/Plan: 1 Day Post-Op Procedure(s) (LRB): CANNULATED HIP PINNING LEFT (Left) Up with therapy  Tyler Gates A 07/13/2017, 9:34 AM

## 2017-07-13 NOTE — Discharge Summary (Signed)
Physician Discharge Summary  Tyler Gates CBJ:[id number] DOB: 31-Oct-1956 DOA: 07/11/2017  PCP: Sharilyn Sites, MD  Admit date: 07/11/2017 Discharge date: 07/13/2017  Admitted From: Home Disposition:  Home  Recommendations for Outpatient Follow-up:  1. Follow up with PCP in 1 week 2. Please obtain BMP/CBC in one week 3. Follow-up with orthopedics/Dr. Lyla Glassing as scheduled. Activity as per orthopedics and physical therapy recommendations. Wound care as per orthopedics recommendations.   Home Health: No Equipment/Devices: None  Discharge Condition: Stable CODE STATUS: Full Diet recommendation: Heart Healthy/high fiber diet  Brief/Interim Summary: 61 y.o.malewith medical history significant of hypertension, dyslipidemia, Crohn's disease, depression, GERD presented with fall and left hip pain. He was found to have left femoral neck fracture. He had surgical intervention done by orthopedics on 07/12/2017. Patient tolerated physical therapy. Orthopedics has cleared the patient for discharge.   Discharge Diagnoses:  Principal Problem:   Left displaced femoral neck fracture (HCC) Active Problems:   Hyperlipidemia   Essential hypertension   Crohn's disease ([place])   Fracture of femoral neck, left (HCC)   Left femoral neck fracture slightly displaced - Status post surgical intervention by orthopedics on 07/12/2017 -  Weightbearing and wound care as per orthopedics recommendations -  Aspirin 325 mg twice a day for DVT prophylaxis as per orthopedics - Outpatient follow-up with orthopedics. Orthopedics has cleared the patient for discharge - Patient has tolerated physical therapy.  Hypertension - Monitor blood pressure. Continue with amlodipine. Keep holding off on Benicar  History of Crohn's disease - Continue mesalamine  Dyslipidemia - Continue ezetimibe  Leukocytosis - Probably reactive.   Chronic kidney disease probably stage III - Creatinine stable. Avoid NSAIDs  like Motrin as an outpatient. Outpatient follow-up  Discharge Instructions  Discharge Instructions    Call MD for:  difficulty breathing, headache or visual disturbances    Complete by:  As directed    Call MD for:  extreme fatigue    Complete by:  As directed    Call MD for:  hives    Complete by:  As directed    Call MD for:  persistant dizziness or light-headedness    Complete by:  As directed    Call MD for:  persistant nausea and vomiting    Complete by:  As directed    Call MD for:  redness, tenderness, or signs of infection (pain, swelling, redness, odor or green/yellow discharge around incision site)    Complete by:  As directed    Call MD for:  severe uncontrolled pain    Complete by:  As directed    Call MD for:  temperature >100.4    Complete by:  As directed    Diet - low sodium heart healthy    Complete by:  As directed    Discharge instructions    Complete by:  As directed    Activity as per Orthopedics/PT recommendations Wound care as per Orthopedics recommendations   Increase activity slowly    Complete by:  As directed    Partial weight bearing    Complete by:  As directed    % Body Weight:  30   Laterality:  left   Extremity:  Lower     Allergies as of 07/13/2017      Reactions   Statins    REACTION: Myalgia      Medication List    STOP taking these medications   acetaminophen 500 MG tablet Commonly known as:  TYLENOL   budesonide 3 MG 24 hr  capsule Commonly known as:  ENTOCORT EC   ibuprofen 200 MG tablet Commonly known as:  ADVIL,MOTRIN   olmesartan 20 MG tablet Commonly known as:  BENICAR   RESTORA Caps     TAKE these medications   amLODipine 2.5 MG tablet Commonly known as:  NORVASC Take 1 tablet (2.5 mg total) by mouth daily.   aspirin 325 MG EC tablet Take 1 tablet (325 mg total) by mouth 2 (two) times daily with a meal.   esomeprazole 40 MG capsule Commonly known as:  NEXIUM Take 1 capsule by mouth 2 (two) times daily.    ezetimibe 10 MG tablet Commonly known as:  ZETIA Take 1 tablet (10 mg total) by mouth daily.   HYDROcodone-acetaminophen 5-325 MG tablet Commonly known as:  NORCO/VICODIN Take 1-2 tablets by mouth every 4 (four) hours as needed for moderate pain.   mesalamine 1.2 g EC tablet Commonly known as:  LIALDA TAKE 4 TABLETS BY MOUTH DAILY WITH BREAKFAST.   senna-docusate 8.6-50 MG tablet Commonly known as:  Senokot-S Take 1 tablet by mouth at bedtime as needed for mild constipation.   testosterone 50 MG/5GM (1%) Gel Commonly known as:  ANDROGEL Place 5 g onto the skin daily.   venlafaxine 37.5 MG tablet Commonly known as:  EFFEXOR Take 1 tablet by mouth 2 (two) times daily. Take 1 tab twice a day            Durable Medical Equipment        Start     Ordered   07/13/17 1201  For home use only DME Walker rolling  Once    Question:  Patient needs a walker to treat with the following condition  Answer:  S/p left hip fracture   07/13/17 1200   07/13/17 1201  For home use only DME 3 n 1  Once     07/13/17 1200     Follow-up Information    Swinteck, Aaron Edelman, MD. Schedule an appointment as soon as possible for a visit in 2 week(s).   Specialty:  Orthopedic Surgery Why:  For wound re-check Contact information: [address] [place] [address] [phone]        Sharilyn Sites, MD Follow up in 1 week(s).   Specialty:  Family Medicine Contact information: [address] [place] [address] [phone]          Allergies  Allergen Reactions  . Statins     REACTION: Myalgia    Consultations:  Orthopedics   Procedures/Studies: Pelvis Portable  Result Date: 07/12/2017 CLINICAL DATA:  Post LEFT hip pinning EXAM: PORTABLE PELVIS 1-2 VIEWS COMPARISON:  Portable exam 1254 hours compared earlier intraoperative images of 07/12/2017 and preoperative images of 07/11/2017 FINDINGS: Three cannulated screws have been placed across a nondisplaced  fracture of the LEFT femoral neck. No dislocation. Hip and SI joints preserved. No additional focal bony abnormality seen. IMPRESSION: Post pinning of LEFT femoral neck fracture. Electronically Signed   By: Lavonia Dana M.D.   On: 07/12/2017 13:14   Dg C-arm 1-60 Min-no Report  Result Date: 07/12/2017 Fluoroscopy was utilized by the requesting physician.  No radiographic interpretation.   Dg Hip Operative Unilat W Or W/o Pelvis Left  Result Date: 07/12/2017 CLINICAL DATA:  LEFT femoral neck fracture, LEFT hip pinning EXAM: OPERATIVE LEFT HIP (WITH PELVIS IF PERFORMED) 2 VIEWS TECHNIQUE: Fluoroscopic spot image(s) were submitted for interpretation post-operatively. COMPARISON:  07/11/2017 FLUOROSCOPY TIME:  1 minutes 30 seconds new line exposure: 18.25 mGy  Images obtained:  2 FINDINGS: Three cannulated screws were placed across a nondisplaced fracture of the LEFT femoral neck. No dislocation or additional focal bony abnormality identified. IMPRESSION: Post pinning of a nondisplaced LEFT femoral neck fracture. Electronically Signed   By: Lavonia Dana M.D.   On: 07/12/2017 13:16   Dg Hip Unilat With Pelvis 2-3 Views Left  Result Date: 07/11/2017 CLINICAL DATA:  Fall. EXAM: DG HIP (WITH OR WITHOUT PELVIS) 2-3V LEFT COMPARISON:  No recent prior . FINDINGS: Fracture of the left femoral neck is present. Fracture slightly displaced. Spina bifida occulta L5. Pelvic calcifications consistent phleboliths. No other focal abnormality identified. IMPRESSION: Left femoral neck fracture.  Slight displacement. Electronically Signed   By: Marcello Moores  Register   On: 07/11/2017 15:04    left femoral neck fracture surgical fixation on 07/12/2017   Subjective: Patient seen and examined at bedside. He denies any overnight fever, nausea or vomiting. His pain is controlled  Discharge Exam: Vitals:   07/13/17 0414 07/13/17 0940  BP: 131/68 (!) 145/86  Pulse: (!) 58 (!) 52  Resp: 16 16  Temp: 98 F (36.7 C) 98.4 F (36.9  C)   Vitals:   07/12/17 2121 07/13/17 0041 07/13/17 0414 07/13/17 0940  BP: (!) 154/79 127/79 131/68 (!) 145/86  Pulse: 72 65 (!) 58 (!) 52  Resp: 16 15 16 16   Temp: 98.3 F (36.8 C) 98.4 F (36.9 C) 98 F (36.7 C) 98.4 F (36.9 C)  TempSrc: Oral Oral Oral Oral  SpO2: 95% 95% 94% 96%  Weight:      Height:        General: Pt is alert, awake, not in acute distress Cardiovascular:Rate controlled, S1/S2 + Respiratory: Bilateral decreased breath sounds at bases Abdominal: Soft, NT, ND, bowel sounds + Extremities: no edema, no cyanosis    The results of significant diagnostics from this hospitalization (including imaging, microbiology, ancillary and laboratory) are listed below for reference.     Microbiology: Recent Results (from the past 240 hour(s))  Surgical PCR screen     Status: None   Collection Time: 07/11/17  7:54 PM  Result Value Ref Range Status   MRSA, PCR NEGATIVE NEGATIVE Final   Staphylococcus aureus NEGATIVE NEGATIVE Final    Comment:        The Xpert SA Assay (FDA approved for NASAL specimens in patients over 64 years of age), is one component of a comprehensive surveillance program.  Test performance has been validated by [facility] for patients greater than or equal to 5 year old. It is not intended to diagnose infection nor to guide or monitor treatment.      Labs: BNP (last 3 results) No results for input(s): BNP in the last 8760 hours. Basic Metabolic Panel:  Recent Labs Lab 07/11/17 1512 07/12/17 0503 07/13/17 0522  NA 138 140 140  K 4.2 4.1 4.3  CL 102 105 105  CO2 27 27 29   GLUCOSE 130* 98 124*  BUN 20 19 16   CREATININE 1.49* 1.35* 1.32*  CALCIUM 9.4 8.7* 8.7*  MG  --  1.9 2.1   Liver Function Tests:  Recent Labs Lab 07/11/17 1512  AST 29  ALT 27  ALKPHOS 59  BILITOT 0.9  PROT 8.0  ALBUMIN 4.2   No results for input(s): LIPASE, AMYLASE in the last 168 hours. No results for input(s): AMMONIA in the last 168  hours. CBC:  Recent Labs Lab 07/11/17 1512 07/12/17 0503 07/13/17 0522  WBC 14.4* 10.3 13.3*  NEUTROABS  12.6* 7.4 11.3*  HGB 15.7 13.8 12.8*  HCT 45.6 41.4 38.6*  MCV 89.8 90.6 91.5  PLT 203 179 187   Cardiac Enzymes: No results for input(s): CKTOTAL, CKMB, CKMBINDEX, TROPONINI in the last 168 hours. BNP: Invalid input(s): POCBNP CBG: No results for input(s): GLUCAP in the last 168 hours. D-Dimer No results for input(s): DDIMER in the last 72 hours. Hgb A1c No results for input(s): HGBA1C in the last 72 hours. Lipid Profile No results for input(s): CHOL, HDL, LDLCALC, TRIG, CHOLHDL, LDLDIRECT in the last 72 hours. Thyroid function studies No results for input(s): TSH, T4TOTAL, T3FREE, THYROIDAB in the last 72 hours.  Invalid input(s): FREET3 Anemia work up No results for input(s): VITAMINB12, FOLATE, FERRITIN, TIBC, IRON, RETICCTPCT in the last 72 hours. Urinalysis    Component Value Date/Time   COLORURINE YELLOW 07/11/2017 1925   APPEARANCEUR HAZY (A) 07/11/2017 1925   LABSPEC 1.024 07/11/2017 1925   PHURINE 5.0 07/11/2017 1925   GLUCOSEU NEGATIVE 07/11/2017 1925   HGBUR NEGATIVE 07/11/2017 [place] NEGATIVE 07/11/2017 1925   KETONESUR 5 (A) 07/11/2017 1925   PROTEINUR 30 (A) 07/11/2017 1925   NITRITE NEGATIVE 07/11/2017 1925   LEUKOCYTESUR NEGATIVE 07/11/2017 1925   Sepsis Labs Invalid input(s): PROCALCITONIN,  WBC,  LACTICIDVEN Microbiology Recent Results (from the past 240 hour(s))  Surgical PCR screen     Status: None   Collection Time: 07/11/17  7:54 PM  Result Value Ref Range Status   MRSA, PCR NEGATIVE NEGATIVE Final   Staphylococcus aureus NEGATIVE NEGATIVE Final    Comment:        The Xpert SA Assay (FDA approved for NASAL specimens in patients over 44 years of age), is one component of a comprehensive surveillance program.  Test performance has been validated by [facility] for patients greater than or equal to 58 year old. It is  not intended to diagnose infection nor to guide or monitor treatment.      Time coordinating discharge: 30 minutes  SIGNED:   Aline August, MD  [facility] 07/13/2017, 12:24 PM Pager: [phone]  If 7PM-7AM, please contact night-coverage [email or web address] Password [email or web address]
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
## 2017-07-13 NOTE — Progress Notes (Signed)
Discharged from floor via w/c for transport home by car. Belongings & wife with pt. No changes in assessment. Wyeth Hoffer, [employer]

## 2017-07-13 NOTE — Care Management Note (Signed)
Case Management Note  Patient Details  Name: Tyler Gates MRN: [id number] Date of Birth: Jun 08, 1956  Subjective/Objective:     Left hip fracture, s/p pinning               Action/Plan: Discharge Planning:  NCM spoke to pt and wife at home to assist with care. Pt states RW and 3n1 bedside commode needed for home. Contacted [facility] DME rep for equipment for delivery to room prior to dc.   PCP Sharilyn Sites Md  Expected Discharge Date:   (unknown)               Expected Discharge Plan:  Home/Self Care  In-House Referral:  NA  Discharge planning Services  CM Consult  Post Acute Care Choice:  NA Choice offered to:  NA  DME Arranged:  3-N-1, Walker rolling DME Agency:  [place]:  NA [place] Agency:  NA  Status of Service:  Completed, signed off  If discussed at [place] of Stay Meetings, dates discussed:    Additional Comments:  Erenest Rasher, RN 07/13/2017, 12:09 PM

## 2017-07-14 ENCOUNTER — Encounter (HOSPITAL_COMMUNITY): Payer: Self-pay | Admitting: Orthopedic Surgery

## 2017-07-18 ENCOUNTER — Encounter (HOSPITAL_COMMUNITY): Payer: Self-pay | Admitting: Orthopedic Surgery

## 2017-07-22 ENCOUNTER — Telehealth: Payer: Self-pay
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
## 2017-07-22 NOTE — Telephone Encounter (Signed)
Pt is calling to see if he could get an appointment with LSL and I told him that the first appointment she had was Sept,10 and he said that he could not wait that long. I tried to see if I could help him and he just wanted to leave a voice mail for [place].

## 2017-07-23 LAB — HIV ANTIBODY (ROUTINE TESTING W REFLEX): HIV Screen 4th Generation wRfx: NONREACTIVE

## 2017-07-29 ENCOUNTER — Encounter: Payer: Self-pay | Admitting: Gastroenterology

## 2017-07-29 ENCOUNTER — Ambulatory Visit (INDEPENDENT_AMBULATORY_CARE_PROVIDER_SITE_OTHER): Payer: BLUE CROSS/BLUE SHIELD | Admitting: Gastroenterology

## 2017-07-29 VITALS — BP 143/85 | HR 80 | Temp 97.8°F | Ht 69.0 in | Wt 182.6 lb

## 2017-07-29 DIAGNOSIS — K5010 Crohn's disease of large intestine without complications: Secondary | ICD-10-CM | POA: Diagnosis not present

## 2017-07-29 NOTE — Patient Instructions (Signed)
1. Continue budesonide 74m daily for now but if you start having multiple loose stools daily or increased abdominal pain, increase it to 674mdaily. 2. You can also use imodium 28m34mp to three times daily as needed for diarrhea.  3. Continue mesalamine for now. 4. Repeat your creatinine with Dr. GolHilma Favors scheduled. We will await copy for review.  5. Return to the office in four months to see Dr. RouGala Romney

## 2017-07-29 NOTE — Progress Notes (Signed)
Please call patient, Dr. Gala Romney has provided additional information for the patient. He asks the patient to AVOID all NSAIDs (antiinflammatories) as it could cause crohn's to flare and also increase creatinine.   Ask patient to stop budesonide when he gets back from his vacation.

## 2017-07-29 NOTE — Telephone Encounter (Signed)
Noted. To discuss at ov today.

## 2017-07-29 NOTE — Progress Notes (Signed)
cc'd to pcp 

## 2017-07-29 NOTE — Assessment & Plan Note (Signed)
Doing better the past week, some increased stooling abdominal cramping around the time of his hip surgery. Patient has been on budesonide for possible Crohn's flare which started back in June. Only taking 3 mg daily at this point, developed constipation with higher doses. He may very well not needed at this time but given that he is just getting back to his baseline would not change his therapy. Goal of getting off budesonide upon return from vacation. Advised against prednisone therapy especially acutely given recent hip fracture. Patient has never had a bone density test. He states his orthopedic surgeon states his bones are not thin according to his recent surgery. Discussed need for baseline bone density in the future after he heals completely from his surgery. He also has elevated creatinine which is going to be followed up this week. If remains elevated he may need a nephrology referral and potentially would have to come off of mesalamine and felt to be the culprit. Patient aware. He can utilize Imodium as needed for diarrhea.  Would advise against NSAID use especially with elevated creatinine and Crohn's.   Return to the office in four months to see Dr. Gala Romney.
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
## 2017-08-06 NOTE — Progress Notes (Signed)
Tried to call pt- NA- LMOM 

## 2017-08-06 NOTE — Progress Notes (Signed)
Pt is aware. He said he had his blood work with [name] and they told him that his creatinine was now normal. He said if you wanted the results from [place] you could get them.

## 2017-08-31 NOTE — Progress Notes (Signed)
Please get copy of labs (Creatinine) from [place] for records.

## 2017-09-01 NOTE — Progress Notes (Signed)
Requested from PCP

## 2017-10-22 ENCOUNTER — Other Ambulatory Visit: Payer: Self-pay | Admitting: Internal Medicine

## 2017-10-22 DIAGNOSIS — E785 Hyperlipidemia, unspecified: Secondary | ICD-10-CM

## 2017-10-30 ENCOUNTER — Ambulatory Visit (INDEPENDENT_AMBULATORY_CARE_PROVIDER_SITE_OTHER): Payer: BLUE CROSS/BLUE SHIELD | Admitting: Internal Medicine

## 2017-10-30 ENCOUNTER — Encounter: Payer: Self-pay | Admitting: Internal Medicine

## 2017-10-30 VITALS — BP 134/92 | HR 69 | Ht 69.0 in | Wt 183.6 lb

## 2017-10-30 DIAGNOSIS — I10 Essential (primary) hypertension: Secondary | ICD-10-CM | POA: Diagnosis not present

## 2017-10-30 DIAGNOSIS — I4510 Unspecified right bundle-branch block: Secondary | ICD-10-CM | POA: Diagnosis not present

## 2017-10-30 DIAGNOSIS — Z79899 Other long term (current) drug therapy: Secondary | ICD-10-CM

## 2017-10-30 MED ORDER — HYDROCHLOROTHIAZIDE 12.5 MG PO CAPS: 12.5000 mg | ORAL_CAPSULE | Freq: Every day | ORAL | 3 refills | Status: DC

## 2017-10-30 NOTE — Patient Instructions (Addendum)
Your physician has recommended you make the following change in your medication:  -- START hydrochlorothiazide 12.5 daily at night  Your physician recommends that you return for lab work next week - BMET   Your physician recommends that you schedule a follow-up appointment in [place] with Dr. Debara Pickett
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
## 2017-11-05 ENCOUNTER — Telehealth: Payer: Self-pay | Admitting: Internal Medicine

## 2017-11-05 NOTE — Telephone Encounter (Signed)
Patient called w/MD recommendation to stop hctz and that he will not need labs. Patient wishes to cancel 1 month f/up and would like a 6 month recall.

## 2017-11-05 NOTE — Telephone Encounter (Signed)
PATIENT CALLED AND SAID THE MEDICATION HE WAS TOLD TO DISCONTINUE BY [facility] IS CAUSING AN ISSUE.  PLEASE CALL [phone]

## 2017-11-05 NOTE — Telephone Encounter (Signed)
Ok .. Stop the medicine. I suspected this might happen. Agree, does not need follow-up labs.  Dr. Lemmie Evens

## 2017-11-05 NOTE — Telephone Encounter (Signed)
Returned call to patient. He was recently started on hctz on 11/8. He reports leg cramps after taking the medication. The following day he reported dizziness at work. He checked his BP when he got home and it wasn't "drastically different". He reports he was down 2lbs overnight after taking the medication. 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 He was started on this medication to get his BP down a little lower. His BP yesterday was 138/86 - which is typical for him. He took only ONE dose of the medication - advised he would likely not need the f/up BMET as previously ordered. Will route to MD for further recommendations.

## 2017-11-05 NOTE — Telephone Encounter (Signed)
Spoke with pt and he is doing well. At his last visit, pt was asked to take Budesonide 65m and Mesalamine . Pt went on vacation and felt like the Mesalamine didn't make him feel well. When pt returned from vacation, he said he received a call from our office to d/c Budesonide.   Pt continued to take Budesonide only once daily instead of 3 times daily and d/c mesalamine. He is having normal bowel movements for the first time in years and he would like to know if he can still just take the Budesonide once daily. If so, he will need a refill of med. Please advise.

## 2017-11-05 NOTE — Telephone Encounter (Signed)
Pt says he can not take the Hydrochlorothiazide,having side effects from it.

## 2017-11-06 ENCOUNTER — Other Ambulatory Visit: Payer: Self-pay | Admitting: Internal Medicine

## 2017-11-06 ENCOUNTER — Telehealth: Payer: Self-pay

## 2017-11-06 MED ORDER — BUDESONIDE 3 MG PO CPEP: 3.0000 mg | ORAL_CAPSULE | Freq: Every day | ORAL | 1 refills | Status: DC

## 2017-11-06 NOTE — Addendum Note (Signed)
Addended by: Mahala Menghini on: 11/06/2017 01:19 PM   Modules accepted: Orders

## 2017-11-06 NOTE — Telephone Encounter (Signed)
Pt notified, please schedule apt with Dr. Gala Romney only for 3-4 weeks per [facility].

## 2017-11-06 NOTE — Telephone Encounter (Signed)
Correspondence noted.  Please make patient's follow-up appointment, due to in 3-4 weeks. MUST BE WITH [facility] ONLY.  Make sure patient is aware that his next appointment needs to be with Dr. Gala Romney to discuss chronic management of his Crohn's disease.  For now, can stay off mesalamine.  Will prescribe enough for budesonide to take 3 mg daily until his office visit with Dr. Gala Romney.

## 2017-11-07 ENCOUNTER — Encounter: Payer: Self-pay | Admitting: Internal Medicine

## 2017-11-07 NOTE — Telephone Encounter (Signed)
PATIENT SCHEDULED  °

## 2017-11-20 NOTE — Telephone Encounter (Signed)
Opened in error

## 2017-12-01 ENCOUNTER — Telehealth: Payer: Self-pay

## 2017-12-01 NOTE — Telephone Encounter (Signed)
Tried to call the pt about his appt on 12/02/17. The office will be closed d/t the weather. Pt did not answer but I was able to leave a message. I have cancelled his appt. Please reschedule.

## 2017-12-02 ENCOUNTER — Ambulatory Visit: Payer: BLUE CROSS/BLUE SHIELD | Admitting: Internal Medicine

## 2017-12-03 ENCOUNTER — Other Ambulatory Visit: Payer: Self-pay | Admitting: Gastroenterology

## 2017-12-03 ENCOUNTER — Encounter: Payer: Self-pay | Admitting: Internal Medicine

## 2017-12-03 NOTE — Telephone Encounter (Signed)
PATIENT RESCHEDULED

## 2017-12-10 ENCOUNTER — Other Ambulatory Visit: Payer: Self-pay | Admitting: *Deleted

## 2017-12-10 MED ORDER — ESOMEPRAZOLE MAGNESIUM 40 MG PO CPDR: 40.0000 mg | DELAYED_RELEASE_CAPSULE | Freq: Two times a day (BID) | ORAL | 3 refills | Status: DC

## 2017-12-31 ENCOUNTER — Other Ambulatory Visit: Payer: Self-pay | Admitting: Internal Medicine

## 2017-12-31 DIAGNOSIS — E785 Hyperlipidemia, unspecified: Secondary | ICD-10-CM

## 2017-12-31 NOTE — Telephone Encounter (Signed)
Rx(s) sent to pharmacy electronically.  

## 2018-01-13 DIAGNOSIS — Z4789 Encounter for other orthopedic aftercare: Secondary | ICD-10-CM | POA: Diagnosis not present

## 2018-01-13 DIAGNOSIS — S72092D Other fracture of head and neck of left femur, subsequent encounter for closed fracture with routine healing: Secondary | ICD-10-CM | POA: Diagnosis not present

## 2018-01-23 ENCOUNTER — Ambulatory Visit: Payer: BLUE CROSS/BLUE SHIELD | Admitting: Internal Medicine

## 2018-01-23 ENCOUNTER — Encounter: Payer: Self-pay | Admitting: Internal Medicine

## 2018-01-23 VITALS — BP 154/90 | HR 81 | Temp 97.3°F | Ht 69.0 in | Wt 187.6 lb

## 2018-01-23 DIAGNOSIS — K219 Gastro-esophageal reflux disease without esophagitis: Secondary | ICD-10-CM

## 2018-01-23 DIAGNOSIS — K5010 Crohn's disease of large intestine without complications: Secondary | ICD-10-CM
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
## 2018-01-23 NOTE — Patient Instructions (Addendum)
May continue Entocort 3 mg daily for now  Avoid NSAIDS as much as possible  Return in 6 months to set up a surveillance colonoscopy  Continue Esomeprazole daily  Office visit in 6 months.

## 2018-02-10 ENCOUNTER — Encounter: Payer: Self-pay | Admitting: Physician Assistant

## 2018-02-10 DIAGNOSIS — E663 Overweight: Secondary | ICD-10-CM | POA: Diagnosis not present

## 2018-02-10 DIAGNOSIS — Z6828 Body mass index (BMI) 28.0-28.9, adult: Secondary | ICD-10-CM | POA: Diagnosis not present

## 2018-02-10 DIAGNOSIS — B9789 Other viral agents as the cause of diseases classified elsewhere: Secondary | ICD-10-CM | POA: Diagnosis not present

## 2018-02-10 DIAGNOSIS — Z1389 Encounter for screening for other disorder: Secondary | ICD-10-CM | POA: Diagnosis not present

## 2018-02-10 DIAGNOSIS — K5090 Crohn's disease, unspecified, without complications: Secondary | ICD-10-CM | POA: Diagnosis not present

## 2018-04-04 DIAGNOSIS — S20212A Contusion of left front wall of thorax, initial encounter: Secondary | ICD-10-CM | POA: Diagnosis not present

## 2018-05-22 ENCOUNTER — Encounter: Payer: Self-pay | Admitting: Internal Medicine

## 2018-05-22 ENCOUNTER — Ambulatory Visit: Payer: BLUE CROSS/BLUE SHIELD | Admitting: Internal Medicine

## 2018-05-22 VITALS — BP 115/76 | HR 86 | Ht 69.0 in | Wt 182.4 lb

## 2018-05-22 DIAGNOSIS — I4510 Unspecified right bundle-branch block: Secondary | ICD-10-CM | POA: Diagnosis not present

## 2018-05-22 DIAGNOSIS — E785 Hyperlipidemia, unspecified: Secondary | ICD-10-CM | POA: Diagnosis not present

## 2018-05-22 DIAGNOSIS — I10 Essential (primary) hypertension: Secondary | ICD-10-CM

## 2018-05-22 NOTE — Patient Instructions (Addendum)
Dr. Debara Pickett has recommended the following: -- HOLD zetia -- CT coronary calcium score  Call our office if you wish to schedule This test is done at [address].  This is $150 out of pocket cost - not processed with insurance -- FASTING labs in 3 months  Your physician recommends that you schedule a follow-up appointment in 3 months with Dr. Debara Pickett - after lab work  Coronary CalciumScan A coronary calcium scan is an imaging test used to look for deposits of calcium and other fatty materials (plaques) in the inner lining of the blood vessels of the heart (coronary arteries). These deposits of calcium and plaques can partly clog and narrow the coronary arteries without producing any symptoms or warning signs. This puts a person at risk for a heart attack. This test can detect these deposits before symptoms develop. Tell a health care provider about:  Any allergies you have.  All medicines you are taking, including vitamins, herbs, eye drops, creams, and over-the-counter medicines.  Any problems you or family members have had with anesthetic medicines.  Any blood disorders you have.  Any surgeries you have had.  Any medical conditions you have.  Whether you are pregnant or may be pregnant. What are the risks? Generally, this is a safe procedure. However, problems may occur, including:  Harm to a pregnant woman and her unborn baby. This test involves the use of radiation. Radiation exposure can be dangerous to a pregnant woman and her unborn baby. If you are pregnant, you generally should not have this procedure done.  Slight increase in the risk of cancer. This is because of the radiation involved in the test. What happens before the procedure? No preparation is needed for this procedure. What happens during the procedure?  You will undress and remove any jewelry around your neck or chest.  You will put on a hospital gown.  Sticky electrodes will be placed on  your chest. The electrodes will be connected to an electrocardiogram (ECG) machine to record a tracing of the electrical activity of your heart.  A CT scanner will take pictures of your heart. During this time, you will be asked to lie still and hold your breath for 2-3 seconds while a picture of your heart is being taken. The procedure may vary among health care providers and hospitals. What happens after the procedure?  You can get dressed.  You can return to your normal activities.  It is up to you to get the results of your test. Ask your health care provider, or the department that is doing the test, when your results will be ready. Summary  A coronary calcium scan is an imaging test used to look for deposits of calcium and other fatty materials (plaques) in the inner lining of the blood vessels of the heart (coronary arteries).  Generally, this is a safe procedure. Tell your health care provider if you are pregnant or may be pregnant.  No preparation is needed for this procedure.  A CT scanner will take pictures of your heart.  You can return to your normal activities after the scan is done. This information is not intended to replace advice given to you by your health care provider. Make sure you discuss any questions you have with your health care provider. Document Released: 06/06/2008 Document Revised: 10/28/2016 Document Reviewed: 10/28/2016 Elsevier Interactive Patient Education  2017 [employer].
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
## 2018-05-29 ENCOUNTER — Encounter: Payer: Self-pay | Admitting: Internal Medicine

## 2018-06-02 DIAGNOSIS — S72002D Fracture of unspecified part of neck of left femur, subsequent encounter for closed fracture with routine healing: Secondary | ICD-10-CM | POA: Diagnosis not present

## 2018-06-17 ENCOUNTER — Other Ambulatory Visit: Payer: Self-pay | Admitting: Internal Medicine

## 2018-06-18 ENCOUNTER — Other Ambulatory Visit: Payer: Self-pay

## 2018-07-16 ENCOUNTER — Other Ambulatory Visit: Payer: Self-pay | Admitting: *Deleted

## 2018-07-16 DIAGNOSIS — E785 Hyperlipidemia, unspecified: Secondary | ICD-10-CM

## 2018-07-16 DIAGNOSIS — I10 Essential (primary) hypertension: Secondary | ICD-10-CM

## 2018-07-16 DIAGNOSIS — Z8249 Family history of ischemic heart disease and other diseases of the circulatory system: Secondary | ICD-10-CM

## 2018-07-22 ENCOUNTER — Ambulatory Visit (INDEPENDENT_AMBULATORY_CARE_PROVIDER_SITE_OTHER)
Admission: RE | Admit: 2018-07-22 | Discharge: 2018-07-22 | Disposition: A | Discharge status: Home / Self Care | Payer: BLUE CROSS/BLUE SHIELD | Source: Ambulatory Visit | Attending: Internal Medicine | Admitting: Internal Medicine

## 2018-07-22 DIAGNOSIS — I10 Essential (primary) hypertension: Secondary | ICD-10-CM

## 2018-07-22 DIAGNOSIS — E785 Hyperlipidemia, unspecified: Secondary | ICD-10-CM

## 2018-07-22 DIAGNOSIS — Z8249 Family history of ischemic heart disease and other diseases of the circulatory system: Secondary | ICD-10-CM

## 2018-08-06 DIAGNOSIS — E785 Hyperlipidemia, unspecified: Secondary | ICD-10-CM | POA: Diagnosis not present

## 2018-08-07 LAB — LIPID PANEL
Chol/HDL Ratio: 7.2 ratio — ABNORMAL HIGH (ref 0.0–5.0)
Cholesterol, Total: 258 mg/dL — ABNORMAL HIGH (ref 100–199)
HDL: 36 mg/dL — ABNORMAL LOW (ref >39)
LDL Calculated: 157 mg/dL — ABNORMAL HIGH (ref 0–99)
Triglycerides: 326 mg/dL — ABNORMAL HIGH (ref 0–149)
VLDL Cholesterol Cal: 65 mg/dL — ABNORMAL HIGH (ref 5–40)

## 2018-08-10 ENCOUNTER — Ambulatory Visit: Payer: BLUE CROSS/BLUE SHIELD | Admitting: Internal Medicine

## 2018-08-10 ENCOUNTER — Encounter: Payer: Self-pay | Admitting: Internal Medicine

## 2018-08-10 VITALS — BP 144/88 | HR 77 | Ht 69.0 in | Wt 186.0 lb

## 2018-08-10 DIAGNOSIS — R931 Abnormal findings on diagnostic imaging of heart and coronary circulation: Secondary | ICD-10-CM | POA: Diagnosis not present

## 2018-08-10 DIAGNOSIS — Z8249 Family history of ischemic heart disease and other diseases of the circulatory system: Secondary | ICD-10-CM

## 2018-08-10 DIAGNOSIS — E785 Hyperlipidemia, unspecified: Secondary | ICD-10-CM | POA: Diagnosis not present

## 2018-08-10 DIAGNOSIS — I10 Essential (primary) hypertension: Secondary | ICD-10-CM | POA: Diagnosis not present

## 2018-08-10 NOTE — Progress Notes (Signed)
OFFICE NOTE  Chief Complaint: Follow-up calcium score and lipid profile  Primary Care Physician: Sharilyn Sites, MD  HPI:  Tyler Gates this pleasant 62 year old male that I last saw in 2014. He has a history of hypertension and marked dyslipidemia and has been intolerant to statins. He's an avid cyclist but does have a family history of heart disease which is significant and his father had coronary events at an early age. He underwent metabolic testing in December 2013 which showed an RER of 1.08, peak VO2 01 14% predicted which was excellent. At the time he had good control over his blood pressure however recently he's been having problems with low blood pressure. He says in the fall his blood pressure was elevated and he was switched from Exforge 10/320 mg daily to valsartan 320 mg daily. During bike rides an exercise he felt that he would get dizzy at times and thought that his blood pressure possibly was getting low. He did see some systolic blood pressures in the 90s. Subsequently he was changed over to Azor 5/40 mg daily. He says he's uncomfortable with this medicine and has some unusual side effects including dizziness. He unfortunately has not gotten treatment of his dyslipidemia which was remarkable. His total cholesterol was 266, with triglycerides 439, HDL 41 and LDL particle #2758 in 2014. He denies any chest pain or worsening shortness of breath with exertion.  Mr. Remmert returns today for follow-up. He reports some continued lability in his blood pressures. He felt that the valsartan was working fairly well but he still has some problems with dizziness and lower blood pressure. He saw Cyril Mourning, our pharmacist who made adjustments in his medications. He has since gone back to [name]. He feels like the worst time is when he takes the medicine and doesn't ride or bicycling within a couple hours of taking the medicine and he feels dizzy and her has low blood pressure. He then has had a few  episodes were his blood pressures been elevated. Once when he was on a trip to [place] and in the car for several hours. He was under a lot of stress and blood pressure got up to the 170s which he had a headache.  I saw [name] back in the office today. After some adjustments on his medicines last time he feels that he now has good control of his blood pressure. Today the office it was 122/80. He's currently taking a Benicar 40 mg daily and amlodipine 2.5 mg daily. He says that his blood pressures are not as labile as they used to be and he is not having any symptoms such as dizziness or fatigue.  [name] returns today for follow-up. Overall he is feeling well. He says his blood pressure is running a little bit higher but he's been on of his Benicar for a few days. He denies any chest pain or shortness of breath. He reports he stopped taking Zetia due to cost issues over the past year and has been out of it for several months. He was due for recheck lipid profile today. A routine EKG was performed in the office which shows a new right bundle branch block. This was not present on his prior EKG in May 2016. He tells me however he is asymptomatic and in fact has gone on to very long bike rides over the past several weeks without any change in exercise tolerance.  06/28/2016  Bill was seen back today in the office for follow-up.  He continues to cycle and exercise regularly and is without cardiac complaints. Blood pressure appears to be fairly well-controlled although he has persistent diastolic hypertension. At his last office visit I started him on ezetimibe due to intolerance of statins and he seems to be taking that without issues. He recently had blood work through his primary care provider which we will request today. He indicated that his cholesterol was down but not optimized.  01/03/2017  Bill returns today for follow-up. In the interim he is mesh lose about 10 pounds. Unfortunately his blood pressure  still remains a little bit elevated as far as the diastolic blood pressure is concerned. He was previously on Benicar 40 mg daily however that has been reduced to 20 mg. He is also taking amlodipine 5 mg instead of 2.5 mg daily. He continues to cycle and denies any chest pain or worsening shortness of breath. He is unsteady and has had statin intolerance in the past. He's overdue for repeat lipid profile.  10/30/2017  Mr. Oros returns today for follow-up.  I last saw him in January.  He reports he had a good summer and one several mountain bike races.  He unfortunately had a fall over the summer and fractured his femoral neck.  This required screws and a plate to repair.  He is recovered from that and has been concerned recently about his seasonal elevation in blood pressure.  He says in the summer he took several readings showing low blood pressure and managed it with the lower dose amlodipine.  Typically in the winter he increases the dose of his amlodipine for higher blood pressures and notes that he gets headache and symptoms when his blood pressure is elevated.  He told me recently, including even this morning that his blood pressure was quite high in the morning.  He inquired about whether he would benefit from a low-dose diuretic.  05/22/2018  Mr. Gover is seen today in follow-up.  He is overall continuing to do well.  He is exercising regularly and continues to cycle.  Over the summer he has managed with lower dose amlodipine and blood pressure today was well controlled 115/76.  He remains asymptomatic, denies any chest pain or worsening shortness of breath.  Of note, lab work in May 2018 showed total cholesterol 268, HDL 38, LDL 176 and triglycerides 271.  This represents marked dyslipidemia although he has no known coronary disease or cardiovascular disease in the past.  There is family history of heart disease mostly on his mother side.  He reports that he was told he had high cholesterol even in  his 68s and has unfortunately been intolerant of statins.  He is tried both atorvastatin, rosuvastatin, pravastatin and lovastatin.   08/10/2018  Mr. Parke returns today for follow-up.  He underwent a repeat lipid profile recently which showed total cholesterol 258, triglycerides 326, HDL 36 and LDL 157.  In fact he brought in lab work from about 30 years ago which indicated similar numbers.  He underwent CT scan calcium scoring which showed calcifications in the mid RCA and LAD with a calcium score of 410.  This is 85th percentile for age and sex matched control.  Given his high CT coronary calcium score, perfusion imaging was recommended.  In addition was found to have a mildly dilated ascending aorta 3.9 cm.  An incidental finding of a hypoattenuating signal in the liver was noted, most consistent with a cyst per radiology.  Unfortunately he has been intolerant of  statins and has not had significant benefit in reducing his lipid profile on ezetimibe.  PMHx:  Past Medical History:  Diagnosis Date  . Crohn's disease ([place])   . Depression   . Dyspnea on exertion   . GERD (gastroesophageal reflux disease)   . Hyperlipidemia   . Hypertension   . Instability of knee joint   . Snoring     Past Surgical History:  Procedure Laterality Date  . COLONOSCOPY  2007   Dr. Sabino Donovan normal TI, ascending colon/cecum with mild inflammation noted but biopses without signs of active inflammation, hyperpastic polyp, possible ischemia on path of left colon biopsy  . COLONOSCOPY N/A 11/08/2015   Procedure: COLONOSCOPY;  Surgeon: [name], MD;  [facility];  Service: Endoscopy;  Laterality: N/A;  . ESOPHAGOGASTRODUODENOSCOPY  2007   Dr. Sabino Donovan reflux esophagitis, hiatal hernia, negative H.pylori  . ESOPHAGOGASTRODUODENOSCOPY N/A 11/08/2015   Procedure: ESOPHAGOGASTRODUODENOSCOPY (EGD);  Surgeon: [name], MD;  [facility];  Service: Endoscopy;  Laterality: N/A;  0830 - moved to 8:15 -  office to notify  . HIP PINNING,CANNULATED Left 07/12/2017   Procedure: CANNULATED HIP PINNING LEFT;  Surgeon: Rod Can, MD;  Location: [facility];  Service: Orthopedics;  Laterality: Left;    FAMHx:  Family History  Problem Relation Age of Onset  . Melanoma Father   . Heart disease Father   . Healthy Mother   . Colon cancer Neg Hx     SOCHx:   reports that he has never smoked. He has never used smokeless tobacco. He reports that he drinks alcohol. He reports that he does not use drugs.  ALLERGIES:  Allergies  Allergen Reactions  . Statins     REACTION: Myalgia    ROS: Pertinent items noted in HPI and remainder of comprehensive ROS otherwise negative.  HOME MEDS: Current Outpatient Medications  Medication Sig Dispense Refill  . amLODipine (NORVASC) 2.5 MG tablet TAKE 1 TABLET TWICE A DAY 180 tablet 3  . budesonide (ENTOCORT EC) 3 MG 24 hr capsule Take 1 capsule (3 mg total) by mouth daily. 90 capsule 1  . esomeprazole (NEXIUM) 40 MG capsule Take 1 capsule (40 mg total) by mouth 2 (two) times daily. 180 capsule 3  . ezetimibe (ZETIA) 10 MG tablet Take 1 tablet (10 mg total) by mouth daily. 90 tablet 3  . olmesartan (BENICAR) 20 MG tablet Take 20 mg daily by mouth.    . testosterone (ANDROGEL) 50 MG/5GM (1%) GEL Place 5 g onto the skin daily.     Marland Kitchen venlafaxine (EFFEXOR) 37.5 MG tablet Take 1 tablet by mouth 2 (two) times daily. Take 1 tab twice a day     No current facility-administered medications for this visit.     LABS/IMAGING: No results found for this or any previous visit (from the past 48 hour(s)). No results found.  VITALS: BP (!) 144/88 (BP Location: Left Arm, Patient Position: Sitting, Cuff Size: Large)   Pulse 77   Ht 5' 9"  (1.753 m)   Wt 186 lb (84.4 kg)   BMI 27.47 kg/m   EXAM: Deferred  EKG: Deferred  ASSESSMENT: 1. High CAC score-410 2. Labile hypertension 3. Dyslipidemia -? familial hyperlipidemia 4. Alternating RBBB and iRBBB,  asymptomatic 5. Statin intolerant 6.   PLAN: 1.   [name] was found to have a high coronary artery calcium score 410, with disease in the LAD and RCA.  He has very high cholesterol numbers and possible familial hyperlipidemia.  He brought labs in from 30 years ago today which also indicated high cholesterol.  Unfortunately he has been statin intolerant.  Ezetimibe is not lowered his cholesterol significantly.  He is an excellent candidate for PCSK9 inhibitor.  I would pursue Repatha.  One option for him may be an upcoming clinical trial called [facility].  I am currently enrolling patients in this.  I believe he would meet criteria however could be randomized to placebo versus active treatment.  I will have our trial coordinators reach out to him.  Otherwise we may pursue commercially available Repatha.  Pixie Casino, MD, [facility], [place] Director of the [facility] Lipid Disorders &  Cardiovascular Risk Reduction Clinic Diplomate of the American Board of Clinical Lipidology Attending Cardiologist  Direct Dial: [phone]  Fax: [phone]  Website:  [email or web address]   Nadean Corwin Hilty 08/10/2018, 11:00 AM

## 2018-08-10 NOTE — Patient Instructions (Addendum)
Dr. Debara Pickett has ordered an Exercise Myocardial Perfusion Imaging Study.  Your physician recommends that you schedule a follow-up appointment after your test.     The test will take approximately 3 to 4 hours to complete; you may bring reading material.  If someone comes with you to your appointment, they will need to remain in the main lobby due to limited space in the testing area.   You will need to hold the following medications prior to your stress test: beta-blockers (24 hours prior to test)   How to prepare for your Myocardial Perfusion Test:  Do not eat or drink 3 hours prior to your test, except you may have water.  Do not consume products containing caffeine (regular or decaffeinated) 12 hours prior to your test. (ex: coffee, chocolate, sodas, tea).  Do wear comfortable clothes (no dresses or overalls) and walking shoes, tennis shoes preferred (No heels or open toe shoes are allowed).  Do NOT wear cologne, perfume, aftershave, or lotions (deodorant is allowed).  If these instructions are not followed, your test will have to be rescheduled.

## 2018-08-14 ENCOUNTER — Telehealth (HOSPITAL_COMMUNITY): Payer: Self-pay

## 2018-08-14 NOTE — Telephone Encounter (Signed)
Encounter complete. 

## 2018-08-19 ENCOUNTER — Ambulatory Visit (HOSPITAL_COMMUNITY)
Admission: RE | Admit: 2018-08-19 | Discharge: 2018-08-19 | Disposition: A | Discharge status: Home / Self Care | Payer: BLUE CROSS/BLUE SHIELD | Source: Ambulatory Visit | Attending: Internal Medicine | Admitting: Internal Medicine

## 2018-08-19 DIAGNOSIS — R931 Abnormal findings on diagnostic imaging of heart and coronary circulation: Secondary | ICD-10-CM | POA: Insufficient documentation

## 2018-08-19 LAB — MYOCARDIAL PERFUSION IMAGING
Estimated workload: 11 METS
Exercise duration (min): 9 min
Exercise duration (sec): 33 s
LV dias vol: 115 mL (ref 62–150)
LV sys vol: 63 mL
MPHR: 158 {beats}/min
Peak HR: 162 {beats}/min
Percent HR: 102 %
RPE: 17
Rest HR: 78 {beats}/min
SDS: 3
SRS: 1
SSS: 4
TID: 0.94

## 2018-08-19 MED ORDER — TECHNETIUM TC 99M TETROFOSMIN IV KIT
10.9000 | PACK | Freq: Once | INTRAVENOUS | Status: AC | PRN
  Administered 2018-08-19: 10.9 via INTRAVENOUS
  Filled 2018-08-19: qty 11

## 2018-08-19 MED ORDER — TECHNETIUM TC 99M TETROFOSMIN IV KIT
31.4000 | PACK | Freq: Once | INTRAVENOUS | Status: AC | PRN
  Administered 2018-08-19: 31.4 via INTRAVENOUS
  Filled 2018-08-19: qty 32

## 2018-08-26 IMAGING — CT CT HEART SCORING
2 series · 16 of 20 positions shown, 18 images · non-contrast
Comparison: None.

CLINICAL DATA: Risk stratification

EXAM:
Coronary Calcium Score
TECHNIQUE: The patient was scanned on a Siemens Somatom 64 slice scanner. Axial
non-contrast 3 mm slices were carried out through the heart. The
data set was analyzed on a dedicated work station and scored using
the Agatson method.

[Series 3: casc 3.0 i36f 2 bestdiast 68 % · axial · 0.43mm/px · z∈[-249,-144]mm · 8 of 46 slices shown, 10 images]
[im 6/46  vessel]
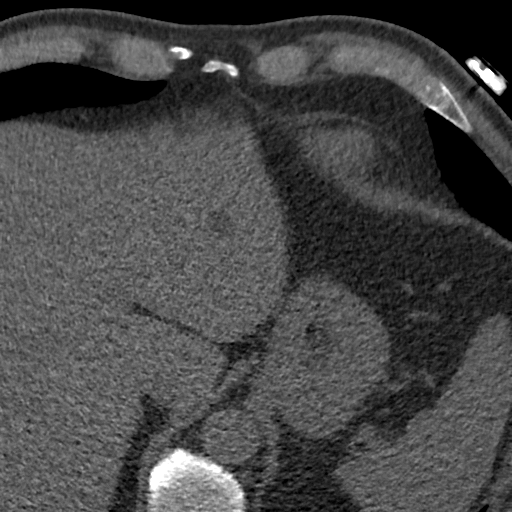
[im 6/46  lung]
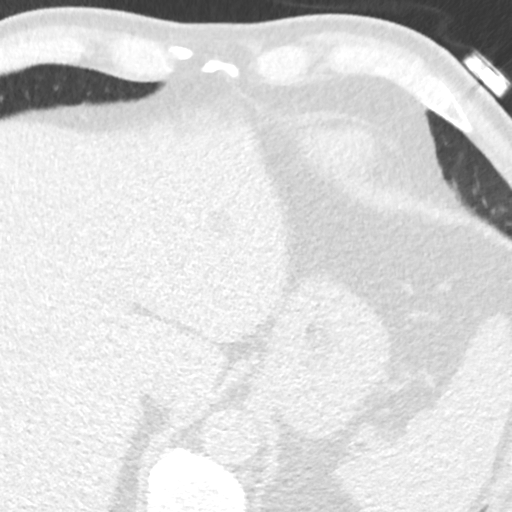
[im 11/46  vessel]
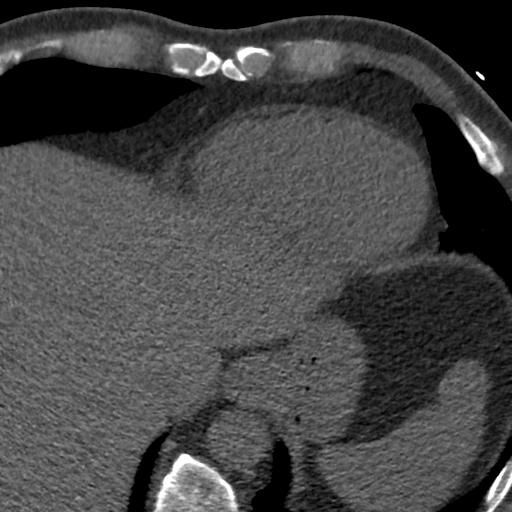
[im 16/46  vessel]
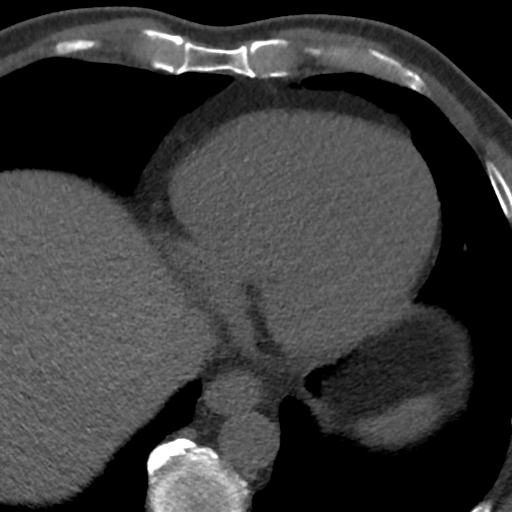
[im 21/46  vessel]
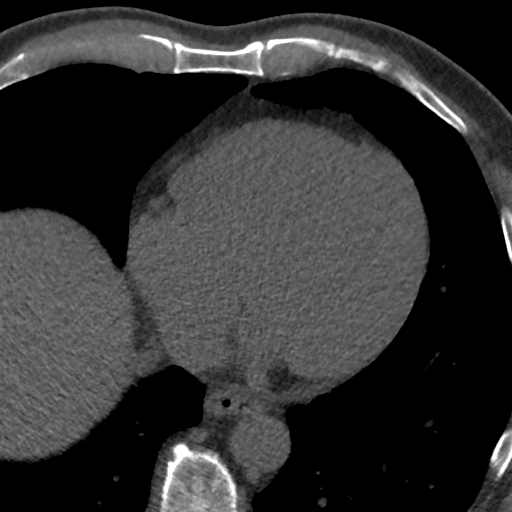
[im 26/46  vessel]
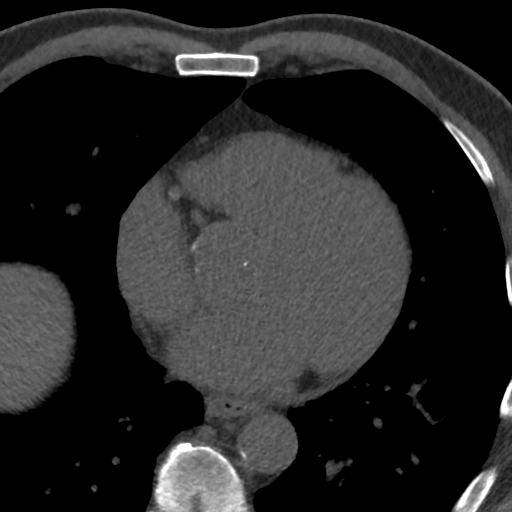
[im 26/46  lung]
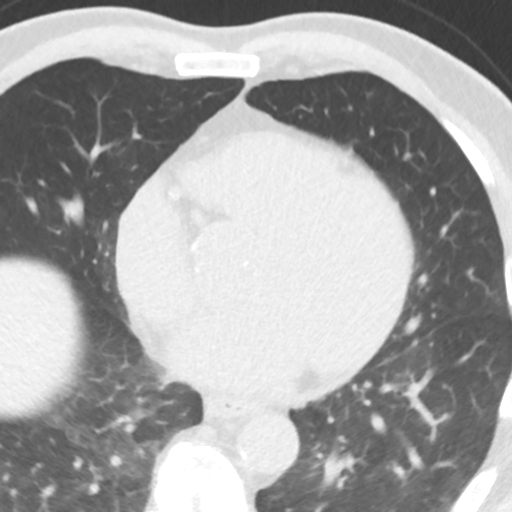
[im 31/46  vessel]
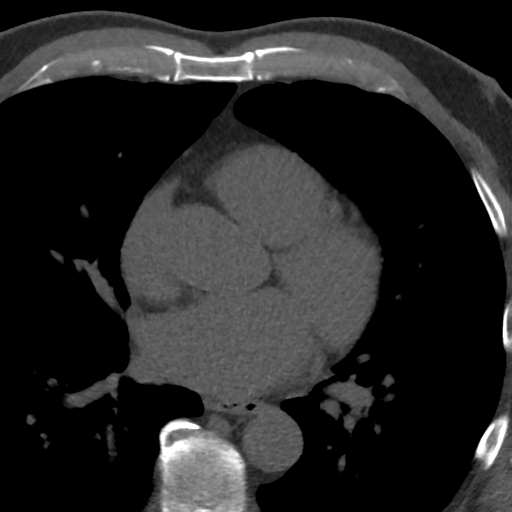
[im 36/46  vessel]
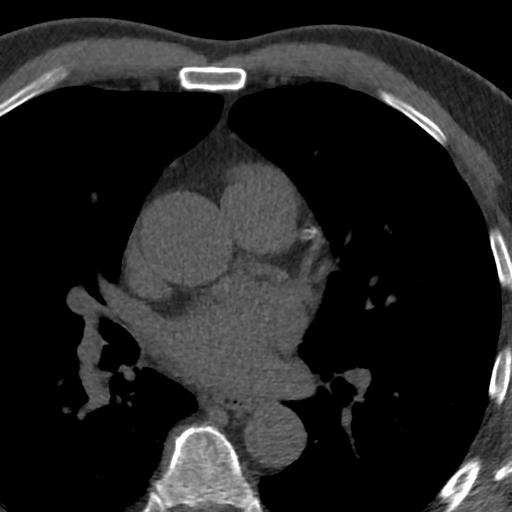
[im 41/46  vessel]
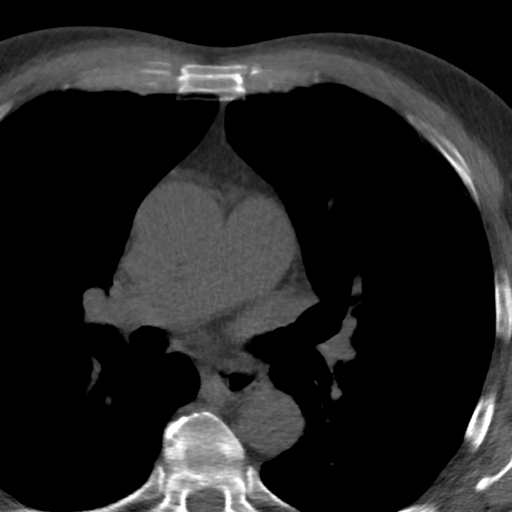

[Series 5: lung st 68 % · axial · 0.67mm/px · z∈[-252,-146]mm · 8 of 47 slices shown]
[im 6/47  lung]
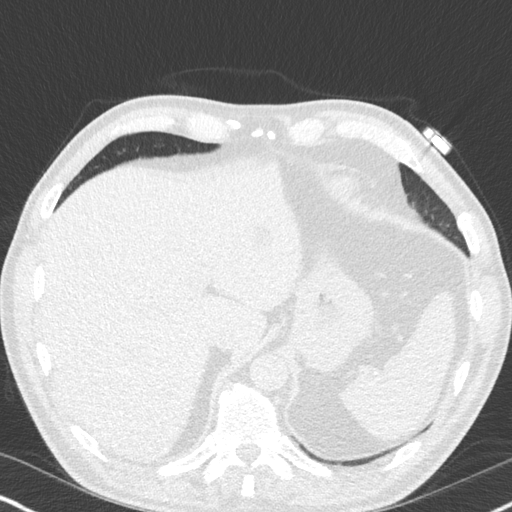
[im 11/47  lung]
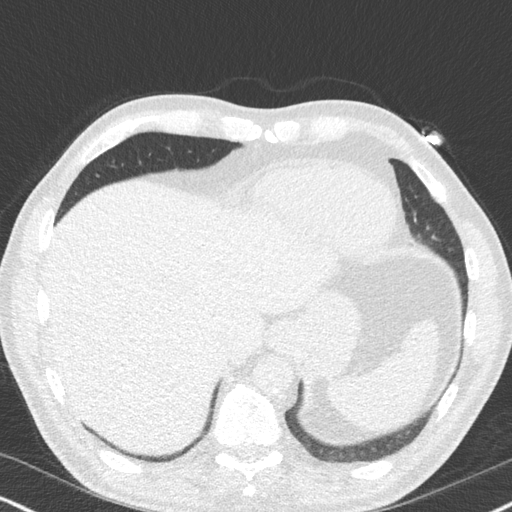
[im 16/47  lung]
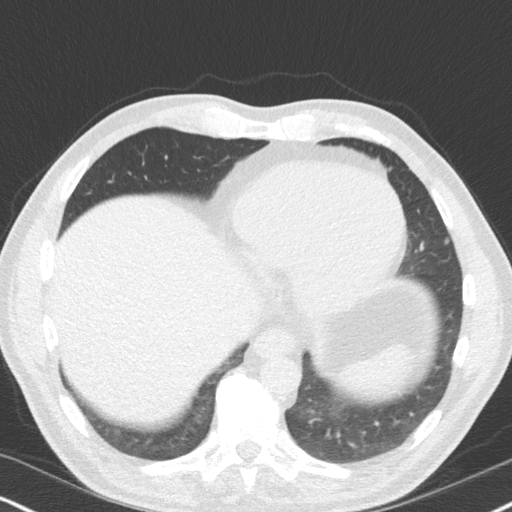
[im 21/47  lung]
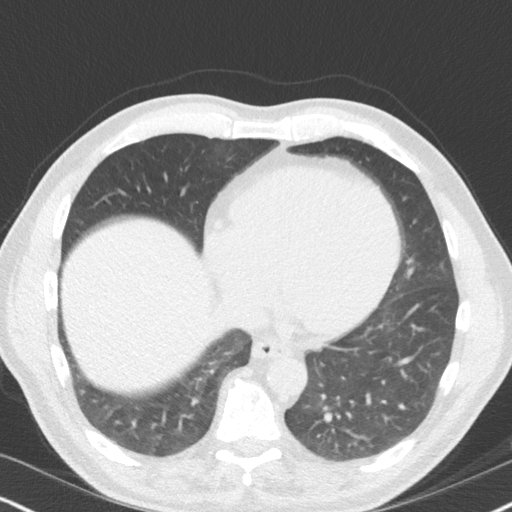
[im 26/47  lung]
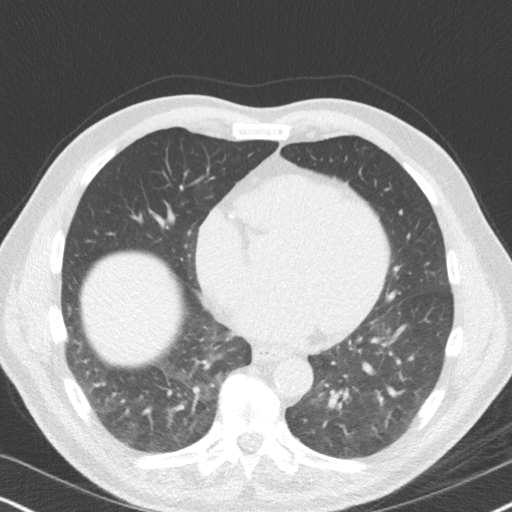
[im 31/47  lung]
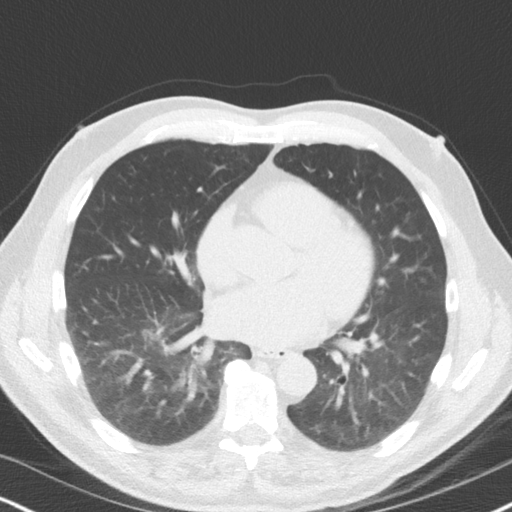
[im 36/47  lung]
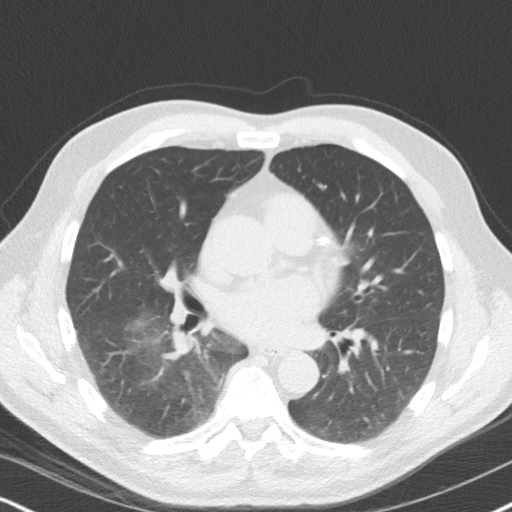
[im 41/47  lung]
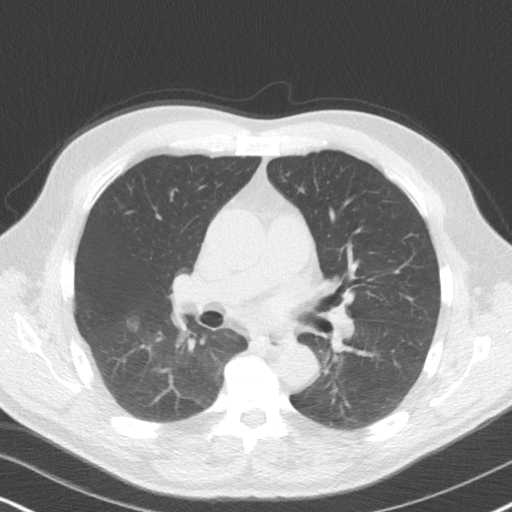

[16 of 20 positions shown; findings below may reference images not displayed]

FINDINGS: Non-cardiac: See separate report from [REDACTED].

Ascending Aorta: Mildly dilated at 3.9 cm

Pericardium: Normal

Coronary arteries: Calcium noted in the proximal and mid RCA and LAD
IMPRESSION: Coronary calcium score of 410 . This was 85 th percentile for age
and sex matched control.

Consider f/u perfusion study given score over 400

Hayran Tigana

EXAM:
OVER-READ INTERPRETATION  CT CHEST

The following report is an over-read performed by radiologist Dr.
Presada Belingher [REDACTED] on 07/22/2018. This
over-read does not include interpretation of cardiac or coronary
anatomy or pathology. The coronary calcium score/coronary CTA
interpretation by the cardiologist is attached.
FINDINGS: Aortic atherosclerosis. Within the visualized portions of the thorax
there are no suspicious appearing pulmonary nodules or masses, there
is no acute consolidative airspace disease, no pleural effusions, no
pneumothorax and no lymphadenopathy. Visualized portions of the
upper abdomen demonstrates a well-defined 1.7 cm low-attenuation
lesion in segment 2 of the liver, incompletely characterized on
today's noncontrast CT examination but statistically likely a cyst.
Healing fracture of an anterior lower left-sided rib (axial image 45
of series 4), which cannot be numbered accurately on today's partial
thoracic CT. There are no aggressive appearing lytic or blastic
lesions noted in the visualized portions of the skeleton.
IMPRESSION: 1.  Aortic Atherosclerosis ([id number].T).
2. Healing lower left anterior rib fracture.

## 2018-08-28 ENCOUNTER — Encounter: Payer: Self-pay | Admitting: Internal Medicine

## 2018-08-28 ENCOUNTER — Ambulatory Visit: Payer: BLUE CROSS/BLUE SHIELD | Admitting: Internal Medicine

## 2018-08-28 ENCOUNTER — Other Ambulatory Visit: Payer: Self-pay

## 2018-08-28 VITALS — BP 140/93 | HR 72 | Temp 97.1°F | Ht 69.0 in | Wt 186.6 lb

## 2018-08-28 DIAGNOSIS — K219 Gastro-esophageal reflux disease without esophagitis: Secondary | ICD-10-CM | POA: Diagnosis not present

## 2018-08-28 DIAGNOSIS — K5010 Crohn's disease of large intestine without complications: Secondary | ICD-10-CM | POA: Diagnosis not present

## 2018-08-28 MED ORDER — BUDESONIDE 3 MG PO CPEP: 3.0000 mg | ORAL_CAPSULE | Freq: Every day | ORAL | 3 refills | Status: DC

## 2018-08-28 NOTE — Progress Notes (Signed)
Primary Care Physician:  Sharilyn Sites, MD Primary Gastroenterologist:  Dr. Gala Romney  Pre-Procedure History & Physical: HPI:  Tyler Gates is a 62 y.o. male here for  Follow-up of Crohn's colitis times multiple decades duration. Doing well on Entocort 3 mg daily. He stopped it for 3 weeks recently and started having a tendency towards loose stools went back on it he is back to baseline. Last colonoscopy 2016 basically had chronic colitis with granulomas with rectal sparing. TI was normal. Mild inflammation distal esophagus on EGD consistent with reflux.  Takes omeprazole 40 mg twice daily for control of symptoms. No dysphagia.  We talked about other long-term alternatives to budesonide;  he's been intolerant to multiple mesalamine's. He wants to stay away from an immunosuppressive agents at this time. He minimizes the use of NSAIDs.  He is due for surveillance colonoscopy at this time.  Also, I cannot find the most recent bone density study. Record to be researched further.  Past Medical History:  Diagnosis Date  . Crohn's disease ([place])   . Depression   . Dyspnea on exertion   . GERD (gastroesophageal reflux disease)   . Hyperlipidemia   . Hypertension   . Instability of knee joint   . Snoring     Past Surgical History:  Procedure Laterality Date  . COLONOSCOPY  2007   Dr. Sabino Donovan normal TI, ascending colon/cecum with mild inflammation noted but biopses without signs of active inflammation, hyperpastic polyp, possible ischemia on path of left colon biopsy  . COLONOSCOPY N/A 11/08/2015   Procedure: COLONOSCOPY;  Surgeon: [name], MD;  [facility];  Service: Endoscopy;  Laterality: N/A;  . ESOPHAGOGASTRODUODENOSCOPY  2007   Dr. Sabino Donovan reflux esophagitis, hiatal hernia, negative H.pylori  . ESOPHAGOGASTRODUODENOSCOPY N/A 11/08/2015   Procedure: ESOPHAGOGASTRODUODENOSCOPY (EGD);  Surgeon: [name], MD;  [facility];  Service: Endoscopy;  Laterality:  N/A;  0830 - moved to 8:15 - office to notify  . HIP PINNING,CANNULATED Left 07/12/2017   Procedure: CANNULATED HIP PINNING LEFT;  Surgeon: Rod Can, MD;  Location: [facility];  Service: Orthopedics;  Laterality: Left;    Prior to Admission medications   Medication Sig Start Date End Date Taking? Authorizing Provider  amLODipine (NORVASC) 2.5 MG tablet TAKE 1 TABLET TWICE A DAY 06/17/18  Yes Hilty, Nadean Corwin, MD  budesonide (ENTOCORT EC) 3 MG 24 hr capsule Take 1 capsule (3 mg total) by mouth daily. 12/03/17  Yes Annitta Needs, NP  esomeprazole (NEXIUM) 40 MG capsule Take 1 capsule (40 mg total) by mouth 2 (two) times daily. 12/10/17  Yes Mahala Menghini, PA-C  ezetimibe (ZETIA) 10 MG tablet Take 1 tablet (10 mg total) by mouth daily. 12/31/17  Yes Hilty, Nadean Corwin, MD  olmesartan (BENICAR) 20 MG tablet Take 20 mg daily by mouth.   Yes [provider]  testosterone (ANDROGEL) 50 MG/5GM (1%) GEL Place 5 g onto the skin daily.  06/04/17  Yes [provider]  venlafaxine (EFFEXOR) 37.5 MG tablet Take 1 tablet by mouth 2 (two) times daily. Take 1 tab twice a day 10/30/15  Yes [provider]    Allergies as of 08/28/2018 - Review Complete 08/28/2018  Allergen Reaction Noted  . Statins  03/06/2007    Family History  Problem Relation Age of Onset  . Melanoma Father   . Heart disease Father   . Healthy Mother   . Colon cancer Neg Hx  Social History   Socioeconomic History  . Marital status: Married    Spouse name: Not on file  . Number of children: Not on file  . Years of education: Not on file  . Highest education level: Not on file  Occupational History  . Not on file  Social Needs  . Financial resource strain: Not on file  . Food insecurity:    Worry: Not on file    Inability: Not on file  . Transportation needs:    Medical: Not on file    Non-medical: Not on file  Tobacco Use  . Smoking status: Never Smoker  . Smokeless tobacco: Never Used    Substance and Sexual Activity  . Alcohol use: Yes    Alcohol/week: 0.0 standard drinks    Comment: glass a wine once a week  . Drug use: No  . Sexual activity: Not on file  Lifestyle  . Physical activity:    Days per week: Not on file    Minutes per session: Not on file  . Stress: Not on file  Relationships  . Social connections:    Talks on phone: Not on file    Gets together: Not on file    Attends religious service: Not on file    Active member of club or organization: Not on file    Attends meetings of clubs or organizations: Not on file    Relationship status: Not on file  . Intimate partner violence:    Fear of current or ex partner: Not on file    Emotionally abused: Not on file    Physically abused: Not on file    Forced sexual activity: Not on file  Other Topics Concern  . Not on file  Social History Narrative  . Not on file    Review of Systems: See HPI, otherwise negative ROS  Physical Exam: BP (!) 140/93   Pulse 72   Temp (!) 97.1 F (36.2 C) (Oral)   Ht 5' 9"  (1.753 m)   Wt 186 lb 9.6 oz (84.6 kg)   BMI 27.56 kg/m  General:   Alert,  Well-developed, well-nourished, pleasant and cooperative in NAD Lungs:  Clear throughout to auscultation.   No wheezes, crackles, or rhonchi. No acute distress. Heart:  Regular rate and rhythm; no murmurs, clicks, rubs,  or gallops. Abdomen: Non-distended, normal bowel sounds.  Soft and nontender without appreciable mass or hepatosplenomegaly.  Pulses:  Normal pulses noted. Extremities:  Without clubbing or edema.  Impression/Plan:  62 year old gentleman with long-standing Crohn's colitis;  no dysplasia with segmental biopsies done 2016. Clinically, doing well on low-dose Budesonide. He is due for a colonoscopy at this time. He states he would like to put it off until the first of year. Need to review last bone density study. GERD well controlled on omeprazole.  Recommendations:  Decrease Entocort to 3 mg every other  day  Will check on last bone density study  Recommend a colonoscopy at this time  Continue omeprazole daily  Avoid NSAIDs as much as possible  Office visit in 1 year    Notice: This dictation was prepared with Dragon dictation along with smaller phrase technology. Any transcriptional errors that result from this process are unintentional and may not be corrected upon review.

## 2018-08-28 NOTE — Patient Instructions (Signed)
Decrease Entocort to 3 mg every other day  Will check on last bone density study  Recommend a colonoscopy at this time  Continue omeprazole daily  Avoid NSAIDs as much as possible  Office visit in 1 year

## 2018-09-02 ENCOUNTER — Telehealth: Payer: Self-pay | Admitting: *Deleted

## 2018-09-02 DIAGNOSIS — K5010 Crohn's disease of large intestine without complications: Secondary | ICD-10-CM

## 2018-09-02 NOTE — Telephone Encounter (Signed)
Patient called back and is aware of appt details. Letter also mailed with information.  Patient also stated he checked with his pharmacy yesterday and they still did not have his budesonide 66m Rx. Please advise Tyler Puttthanks

## 2018-09-02 NOTE — Telephone Encounter (Signed)
Lmom, medication was sent to pharmacy on 08/31/18. Called pharmacy and it was confirmed that they received pts rx for Budesonide. Pt can pick Rx up.

## 2018-09-02 NOTE — Telephone Encounter (Addendum)
Per RMR patient needs bone density between now and the end of the year. According to records from [facility], they have no bone density in chart.   Called radiology and bone density scheduled for 10/01/18, arrive at 9:15am, no calcium supplements x 48 hrs prior to test, bring complete list of meds, wear 2 piece comfy clothing.  Called patient and LMOVM.  Checked [employer] and no PA required

## 2018-09-14 ENCOUNTER — Ambulatory Visit: Payer: BLUE CROSS/BLUE SHIELD | Admitting: Internal Medicine

## 2018-09-14 ENCOUNTER — Encounter: Payer: Self-pay | Admitting: Internal Medicine

## 2018-09-14 ENCOUNTER — Telehealth: Payer: Self-pay | Admitting: Internal Medicine

## 2018-09-14 VITALS — BP 140/98 | HR 67 | Ht 69.0 in | Wt 186.4 lb

## 2018-09-14 DIAGNOSIS — R931 Abnormal findings on diagnostic imaging of heart and coronary circulation: Secondary | ICD-10-CM

## 2018-09-14 DIAGNOSIS — Z789 Other specified health status: Secondary | ICD-10-CM | POA: Insufficient documentation

## 2018-09-14 DIAGNOSIS — E785 Hyperlipidemia, unspecified: Secondary | ICD-10-CM | POA: Diagnosis not present

## 2018-09-14 NOTE — Patient Instructions (Addendum)
Dr. Debara Pickett recommends Repatha (PCSK9). This is an injectable cholesterol medication. This medication will need prior approval with your insurance company, which we will work on. If the medication is not approved initially, we may need to do an appeal with your insurance. We will keep you updated on this process. This medication can be provided at some local pharmacies or be shipped to your from a specialty pharmacy.   Your physician recommends that you return for lab work in [place] (prior to next visit with Dr. Debara Pickett)  Your physician recommends that you schedule a follow-up appointment in [place] with Dr. Debara Pickett (lipid clinic)

## 2018-09-14 NOTE — Progress Notes (Signed)
OFFICE NOTE  Chief Complaint: Follow-up stress test  Primary Care Physician: Sharilyn Sites, MD  HPI:  Tyler Gates this pleasant 62 year old male that I last saw in 2014. He has a history of hypertension and marked dyslipidemia and has been intolerant to statins. He's an avid cyclist but does have a family history of heart disease which is significant and his father had coronary events at an early age. He underwent metabolic testing in December 2013 which showed an RER of 1.08, peak VO2 01 14% predicted which was excellent. At the time he had good control over his blood pressure however recently he's been having problems with low blood pressure. He says in the fall his blood pressure was elevated and he was switched from Exforge 10/320 mg daily to valsartan 320 mg daily. During bike rides an exercise he felt that he would get dizzy at times and thought that his blood pressure possibly was getting low. He did see some systolic blood pressures in the 90s. Subsequently he was changed over to Azor 5/40 mg daily. He says he's uncomfortable with this medicine and has some unusual side effects including dizziness. He unfortunately has not gotten treatment of his dyslipidemia which was remarkable. His total cholesterol was 266, with triglycerides 439, HDL 41 and LDL particle #2758 in 2014. He denies any chest pain or worsening shortness of breath with exertion.  Tyler Gates returns today for follow-up. He reports some continued lability in his blood pressures. He felt that the valsartan was working fairly well but he still has some problems with dizziness and lower blood pressure. He saw Cyril Mourning, our pharmacist who made adjustments in his medications. He has since gone back to [name]. He feels like the worst time is when he takes the medicine and doesn't ride or bicycling within a couple hours of taking the medicine and he feels dizzy and her has low blood pressure. He then has had a few episodes were his  blood pressures been elevated. Once when he was on a trip to [place] and in the car for several hours. He was under a lot of stress and blood pressure got up to the 170s which he had a headache.  I saw Tyler Gates back in the office today. After some adjustments on his medicines last time he feels that he now has good control of his blood pressure. Today the office it was 122/80. He's currently taking a Benicar 40 mg daily and amlodipine 2.5 mg daily. He says that his blood pressures are not as labile as they used to be and he is not having any symptoms such as dizziness or fatigue.  Tyler Gates returns today for follow-up. Overall he is feeling well. He says his blood pressure is running a little bit higher but he's been on of his Benicar for a few days. He denies any chest pain or shortness of breath. He reports he stopped taking Zetia due to cost issues over the past year and has been out of it for several months. He was due for recheck lipid profile today. A routine EKG was performed in the office which shows a new right bundle branch block. This was not present on his prior EKG in May 2016. He tells me however he is asymptomatic and in fact has gone on to very long bike rides over the past several weeks without any change in exercise tolerance.  06/28/2016  Tyler Gates was seen back today in the office for follow-up. He continues to  cycle and exercise regularly and is without cardiac complaints. Blood pressure appears to be fairly well-controlled although he has persistent diastolic hypertension. At his last office visit I started him on ezetimibe due to intolerance of statins and he seems to be taking that without issues. He recently had blood work through his primary care provider which we will request today. He indicated that his cholesterol was down but not optimized.  01/03/2017  Tyler Gates returns today for follow-up. In the interim he is mesh lose about 10 pounds. Unfortunately his blood pressure still remains a little  bit elevated as far as the diastolic blood pressure is concerned. He was previously on Benicar 40 mg daily however that has been reduced to 20 mg. He is also taking amlodipine 5 mg instead of 2.5 mg daily. He continues to cycle and denies any chest pain or worsening shortness of breath. He is unsteady and has had statin intolerance in the past. He's overdue for repeat lipid profile.  10/30/2017  Tyler Gates returns today for follow-up.  I last saw him in January.  He reports he had a good summer and one several mountain bike races.  He unfortunately had a fall over the summer and fractured his femoral neck.  This required screws and a plate to repair.  He is recovered from that and has been concerned recently about his seasonal elevation in blood pressure.  He says in the summer he took several readings showing low blood pressure and managed it with the lower dose amlodipine.  Typically in the winter he increases the dose of his amlodipine for higher blood pressures and notes that he gets headache and symptoms when his blood pressure is elevated.  He told me recently, including even this morning that his blood pressure was quite high in the morning.  He inquired about whether he would benefit from a low-dose diuretic.  05/22/2018  Tyler Gates is seen today in follow-up.  He is overall continuing to do well.  He is exercising regularly and continues to cycle.  Over the summer he has managed with lower dose amlodipine and blood pressure today was well controlled 115/76.  He remains asymptomatic, denies any chest pain or worsening shortness of breath.  Of note, lab work in May 2018 showed total cholesterol 268, HDL 38, LDL 176 and triglycerides 271.  This represents marked dyslipidemia although he has no known coronary disease or cardiovascular disease in the past.  There is family history of heart disease mostly on his mother side.  He reports that he was told he had high cholesterol even in his 59s and has  unfortunately been intolerant of statins.  He is tried both atorvastatin, rosuvastatin, pravastatin and lovastatin.   08/10/2018  Tyler Gates returns today for follow-up.  He underwent a repeat lipid profile recently which showed total cholesterol 258, triglycerides 326, HDL 36 and LDL 157.  In fact he brought in lab work from about 30 years ago which indicated similar numbers.  He underwent CT scan calcium scoring which showed calcifications in the mid RCA and LAD with a calcium score of 410.  This is 85th percentile for age and sex matched control.  Given his high CT coronary calcium score, perfusion imaging was recommended.  In addition was found to have a mildly dilated ascending aorta 3.9 cm.  An incidental finding of a hypoattenuating signal in the liver was noted, most consistent with a cyst per radiology.  Unfortunately he has been intolerant of statins and has  not had significant benefit in reducing his lipid profile on ezetimibe.  09/14/2018  Tyler Gates is seen today in follow-up.  He underwent nuclear stress testing which was low risk for ischemia.  LVEF was slightly reduced however abnormal gating is likely responsible for this.  It appeared visually normal.  He is a coronary calcium score is quite high.  His LDL is well above goal on ezetimibe.  Unfortunately he has been intolerant to statins.  At this point I would recommend pursuing PCSK9 inhibitor.  This could make a significant reduction in cholesterol and his cardiovascular risk.  After much discussion today seemed to be agreeable to consider this therapy.   PMHx:  Past Medical History:  Diagnosis Date  . Crohn's disease ([place])   . Depression   . Dyspnea on exertion   . GERD (gastroesophageal reflux disease)   . Hyperlipidemia   . Hypertension   . Instability of knee joint   . Snoring     Past Surgical History:  Procedure Laterality Date  . COLONOSCOPY  2007   Dr. Sabino Donovan normal TI, ascending colon/cecum with mild inflammation  noted but biopses without signs of active inflammation, hyperpastic polyp, possible ischemia on path of left colon biopsy  . COLONOSCOPY N/A 11/08/2015   Procedure: COLONOSCOPY;  Surgeon: [name], MD;  [facility];  Service: Endoscopy;  Laterality: N/A;  . ESOPHAGOGASTRODUODENOSCOPY  2007   Dr. Sabino Donovan reflux esophagitis, hiatal hernia, negative H.pylori  . ESOPHAGOGASTRODUODENOSCOPY N/A 11/08/2015   Procedure: ESOPHAGOGASTRODUODENOSCOPY (EGD);  Surgeon: [name], MD;  [facility];  Service: Endoscopy;  Laterality: N/A;  0830 - moved to 8:15 - office to notify  . HIP PINNING,CANNULATED Left 07/12/2017   Procedure: CANNULATED HIP PINNING LEFT;  Surgeon: Rod Can, MD;  Location: [facility];  Service: Orthopedics;  Laterality: Left;    FAMHx:  Family History  Problem Relation Age of Onset  . Melanoma Father   . Heart disease Father   . Healthy Mother   . Colon cancer Neg Hx     SOCHx:   reports that he has never smoked. He has never used smokeless tobacco. He reports that he drinks alcohol. He reports that he does not use drugs.  ALLERGIES:  Allergies  Allergen Reactions  . Statins     REACTION: Myalgia    ROS: Pertinent items noted in HPI and remainder of comprehensive ROS otherwise negative.  HOME MEDS: Current Outpatient Medications  Medication Sig Dispense Refill  . amLODipine (NORVASC) 2.5 MG tablet TAKE 1 TABLET TWICE A DAY 180 tablet 3  . budesonide (ENTOCORT EC) 3 MG 24 hr capsule Take 1 capsule (3 mg total) by mouth daily. 90 capsule 1  . budesonide (ENTOCORT EC) 3 MG 24 hr capsule Take 1 capsule (3 mg total) by mouth daily. 90 capsule 3  . esomeprazole (NEXIUM) 40 MG capsule Take 1 capsule (40 mg total) by mouth 2 (two) times daily. 180 capsule 3  . ezetimibe (ZETIA) 10 MG tablet Take 1 tablet (10 mg total) by mouth daily. 90 tablet 3  . olmesartan (BENICAR) 20 MG tablet Take 20 mg daily by mouth.    . testosterone (ANDROGEL) 50  MG/5GM (1%) GEL Place 5 g onto the skin daily.     Marland Kitchen venlafaxine (EFFEXOR) 37.5 MG tablet Take 1 tablet by mouth 2 (two) times daily. Take 1 tab twice a day     No current facility-administered medications for this visit.  LABS/IMAGING: No results found for this or any previous visit (from the past 48 hour(s)). No results found.  VITALS: BP (!) 140/98   Pulse 67   Ht 5' 9"  (1.753 m)   Wt 186 lb 6.4 oz (84.6 kg)   SpO2 97%   BMI 27.53 kg/m   EXAM: Deferred  EKG: Deferred  ASSESSMENT: 1. High CAC score-410 2. Labile hypertension 3. Dyslipidemia -? familial hyperlipidemia 4. Alternating RBBB and iRBBB, asymptomatic 5. Statin intolerant 6. Low risk myoview stress test (07/2018)  PLAN: 1.   Tyler Gates had a low risk Myoview stress test, despite very high coronary calcium score.  He is asymptomatic and able to exercise with long bike rides.  He is somewhat skeptical of his ability to lower risk with a PCSK9 inhibitor and that he actually is at very high risk of events.  Nonetheless, I would recommend pursuing Repatha.  We will demonstrate the use of the autoinjector pen today and prescribe it for him.  Plan a repeat lipid profile in 3 to 4 months of follow-up with me afterwards.  Pixie Casino, MD, [facility], [place] Director of the [facility] Lipid Disorders &  Cardiovascular Risk Reduction Clinic Diplomate of the American Board of Clinical Lipidology Attending Cardiologist  Direct Dial: [phone]  Fax: [phone]  Website:  [email or web address] 09/14/2018, 9:32 AM

## 2018-09-14 NOTE — Telephone Encounter (Signed)
PA for Repatha [employer] submitted via [email or web address] (Key: [id number])  Patient given instructions during OV 09/14/18 on how to access co-pay card online

## 2018-09-15 MED ORDER — EVOLOCUMAB 140 MG/ML ~~LOC~~ SOAJ: 1.0000 | SUBCUTANEOUS | 11 refills | Status: DC

## 2018-09-15 NOTE — Addendum Note (Signed)
Addended by: Fidel Levy on: 09/15/2018 08:58 AM   Modules accepted: Orders

## 2018-09-15 NOTE — Telephone Encounter (Signed)
Received notice from [facility] that patient's Repatha has been approved from 09/14/2018 - 09/15/2019.   Patient notified via MyChart message & [facility] to [facility] for processing or forwarding on to specialty mail order pharmacy.

## 2018-09-22 NOTE — Telephone Encounter (Signed)
MyChart message sent to patient requesting update on status of Rx from pharmacy

## 2018-09-25 MED ORDER — EVOLOCUMAB 140 MG/ML ~~LOC~~ SOAJ: 1.0000 | SUBCUTANEOUS | 11 refills | Status: DC

## 2018-09-25 NOTE — Addendum Note (Signed)
Addended by: Fidel Levy on: 09/25/2018 02:17 PM   Modules accepted: Orders

## 2018-09-25 NOTE — Telephone Encounter (Signed)
Called [facility] specialty pharmacy to follow up on status of Repatha Rx - [phone].  Patient has co-pay of $40 He can obtain patient assistance via link previously provided.  Patient notified via [place]

## 2018-10-01 ENCOUNTER — Other Ambulatory Visit (HOSPITAL_COMMUNITY): Payer: BLUE CROSS/BLUE SHIELD

## 2018-10-21 ENCOUNTER — Encounter: Payer: Self-pay | Admitting: Physician Assistant

## 2018-10-21 ENCOUNTER — Ambulatory Visit: Payer: BLUE CROSS/BLUE SHIELD | Admitting: Physician Assistant

## 2018-10-21 VITALS — BP 126/84 | HR 81 | Wt 185.0 lb

## 2018-10-21 DIAGNOSIS — Z125 Encounter for screening for malignant neoplasm of prostate: Secondary | ICD-10-CM | POA: Insufficient documentation

## 2018-10-21 DIAGNOSIS — I129 Hypertensive chronic kidney disease with stage 1 through stage 4 chronic kidney disease, or unspecified chronic kidney disease: Secondary | ICD-10-CM | POA: Diagnosis not present

## 2018-10-21 DIAGNOSIS — Z79890 Hormone replacement therapy (postmenopausal): Secondary | ICD-10-CM | POA: Diagnosis not present

## 2018-10-21 DIAGNOSIS — Z7689 Persons encountering health services in other specified circumstances: Secondary | ICD-10-CM | POA: Diagnosis not present

## 2018-10-21 DIAGNOSIS — Z5181 Encounter for therapeutic drug level monitoring: Secondary | ICD-10-CM | POA: Diagnosis not present

## 2018-10-21 DIAGNOSIS — F3340 Major depressive disorder, recurrent, in remission, unspecified: Secondary | ICD-10-CM | POA: Diagnosis not present

## 2018-10-21 DIAGNOSIS — Z130 Encounter for screening for diseases of the blood and blood-forming organs and certain disorders involving the immune mechanism: Secondary | ICD-10-CM

## 2018-10-21 DIAGNOSIS — E291 Testicular hypofunction: Secondary | ICD-10-CM | POA: Insufficient documentation

## 2018-10-21 DIAGNOSIS — Z23 Encounter for immunization: Secondary | ICD-10-CM | POA: Diagnosis not present

## 2018-10-21 DIAGNOSIS — I1310 Hypertensive heart and chronic kidney disease without heart failure, with stage 1 through stage 4 chronic kidney disease, or unspecified chronic kidney disease: Secondary | ICD-10-CM | POA: Diagnosis not present

## 2018-10-21 DIAGNOSIS — K5010 Crohn's disease of large intestine without complications: Secondary | ICD-10-CM

## 2018-10-21 MED ORDER — BUDESONIDE 3 MG PO CPEP: 3.0000 mg | ORAL_CAPSULE | Freq: Every day | ORAL | 1 refills | Status: DC

## 2018-10-21 MED ORDER — VENLAFAXINE HCL 37.5 MG PO TABS: 37.5000 mg | ORAL_TABLET | Freq: Two times a day (BID) | ORAL | 1 refills | Status: DC

## 2018-10-21 NOTE — Progress Notes (Signed)
HPI:                                                                Tyler Gates is a 62 y.o. male who presents to [facility]: [facility] today to establish care  Current concerns: medication refill  Hypogonadism: has been using Androgel for several years. When he does not take testosterone he notices a change in his exercise tolerance/performance, low energy and decline in mood.  HTN: taking Amlodipine and Benicar daily. Compliant with medications. He self-titrates his Amlodipine between 2.5 and 5 mg daily depending on his volume status (uses lower dose in the summer time when he is sweating during bike ride). According to Cardiology notes and patient, he has struggled to manage his BP and has hypotension with exercise and dehydration. He races [employer]. He endorses episodic headache when his BP is elevated. Denies vision change, chest pain with exertion, orthopnea, lightheadedness, syncope and edema. Risk factors include: male sex, age>55, family hx, HLD  HLD: he will be starting repatha next month. PA was just approved  Depression/Anxiety: well controlled on Effexor 37.5 mg bid for years. Denies symptoms of mania/hypomania. Denies suicidal thinking. Denies auditory/visual hallucinations.    Depression screen PHQ 2/9 10/21/2018  Decreased Interest 0  Down, Depressed, Hopeless 0  PHQ - 2 Score 0  Altered sleeping 0  Tired, decreased energy 0  Change in appetite 0  Feeling bad or failure about yourself  0  Trouble concentrating 0  Moving slowly or fidgety/restless 0  Suicidal thoughts 0  PHQ-9 Score 0  Difficult doing work/chores Not difficult at all    GAD 7 : Generalized Anxiety Score 10/21/2018  Nervous, Anxious, on Edge 1  Control/stop worrying 0  Worry too much - different things 0  Trouble relaxing 1  Restless 0  Easily annoyed or irritable 1  Afraid - awful might happen 0  Total GAD 7 Score 3  Anxiety Difficulty Not  difficult at all      Past Medical History:  Diagnosis Date  . Crohn's disease ([place])   . Depression   . Dyspnea on exertion   . GERD (gastroesophageal reflux disease)   . Herpes   . Hyperlipidemia   . Hypertension   . Instability of knee joint   . Left displaced femoral neck fracture ([place]) 07/11/2017  . Snoring    Past Surgical History:  Procedure Laterality Date  . COLONOSCOPY  2007   Dr. Sabino Donovan normal TI, ascending colon/cecum with mild inflammation noted but biopses without signs of active inflammation, hyperpastic polyp, possible ischemia on path of left colon biopsy  . COLONOSCOPY N/A 11/08/2015   Procedure: COLONOSCOPY;  Surgeon: [name], MD;  [facility];  Service: Endoscopy;  Laterality: N/A;  . ESOPHAGOGASTRODUODENOSCOPY  2007   Dr. Sabino Donovan reflux esophagitis, hiatal hernia, negative H.pylori  . ESOPHAGOGASTRODUODENOSCOPY N/A 11/08/2015   Procedure: ESOPHAGOGASTRODUODENOSCOPY (EGD);  Surgeon: [name], MD;  [facility];  Service: Endoscopy;  Laterality: N/A;  0830 - moved to 8:15 - office to notify  . HIP PINNING,CANNULATED Left 07/12/2017   Procedure: CANNULATED HIP PINNING LEFT;  Surgeon: Rod Can, MD;  Location: [facility];  Service: Orthopedics;  Laterality: Left;   Social  History   Tobacco Use  . Smoking status: Never Smoker  . Smokeless tobacco: Never Used  Substance Use Topics  . Alcohol use: Yes    Alcohol/week: 0.0 standard drinks    Comment: glass a wine once a week   family history includes Healthy in his mother; Heart disease in his father; Melanoma in his father.    ROS: Review of Systems  Gastrointestinal:       + Crohns  Neurological: Positive for headaches.  All other systems reviewed and are negative.    Medications: Current Outpatient Medications  Medication Sig Dispense Refill  . amLODipine (NORVASC) 2.5 MG tablet TAKE 1 TABLET TWICE A DAY 180 tablet 3  . esomeprazole (NEXIUM) 40 MG capsule Take 1  capsule (40 mg total) by mouth 2 (two) times daily. 180 capsule 3  . Evolocumab (REPATHA SURECLICK) 492 MG/ML SOAJ Inject 1 Dose into the skin every 14 (fourteen) days. 2 pen 11  . ezetimibe (ZETIA) 10 MG tablet Take 1 tablet (10 mg total) by mouth daily. 90 tablet 3  . olmesartan (BENICAR) 20 MG tablet Take 20 mg daily by mouth.    . testosterone (ANDROGEL) 50 MG/5GM (1%) GEL Place 5 g onto the skin daily.     . budesonide (ENTOCORT EC) 3 MG 24 hr capsule Take 1 capsule (3 mg total) by mouth daily. 90 capsule 1  . venlafaxine (EFFEXOR) 37.5 MG tablet Take 1 tablet (37.5 mg total) by mouth 2 (two) times daily. Take 1 tab twice a day 180 tablet 1   No current facility-administered medications for this visit.    Allergies  Allergen Reactions  . Statins     REACTION: Myalgia       Objective:  BP 126/84   Pulse 81   Wt 185 lb (83.9 kg)   BMI 27.32 kg/m  Gen:  alert, not ill-appearing, no distress, appropriate for age 36: head normocephalic without obvious abnormality, conjunctiva and cornea clear, wearing glasses, trachea midline Pulm: Normal work of breathing, normal phonation, clear to auscultation bilaterally, no wheezes, rales or rhonchi CV: Normal rate, regular rhythm, s1 and s2 distinct, no murmurs, clicks or rubs  Neuro: alert and oriented x 3, no tremor MSK: extremities atraumatic, normal gait and station Skin: intact, no rashes on exposed skin, no jaundice, no cyanosis Psych: well-groomed, cooperative, good eye contact, euthymic mood, affect mood-congruent, speech is articulate, and thought processes clear and goal-directed    No results found for this or any previous visit (from the past 72 hour(s)). No results found.    Assessment and Plan: 62 y.o. male with   .Tyler Gates was seen today for establish care.  Diagnoses and all orders for this visit:  Encounter to establish care  Encounter for monitoring testosterone replacement therapy -     CBC -      Testosterone -     PSA  Screening PSA (prostate specific antigen) -     PSA  Hypogonadism male -     Testosterone  Screening for blood disease -     CBC  Chronic kidney disease due to benign hypertension -     Renal Profile with Estimated GFR -     Urine Microalbumin w/creat. ratio  Depression, major, recurrent, in remission (HCC) -     venlafaxine (EFFEXOR) 37.5 MG tablet; Take 1 tablet (37.5 mg total) by mouth 2 (two) times daily. Take 1 tab twice a day  Crohn's disease of colon without complication ([place]) -  budesonide (ENTOCORT EC) 3 MG 24 hr capsule; Take 1 capsule (3 mg total) by mouth daily.  Need for immunization against influenza -     Flu Vaccine QUAD 36+ mos IM   - Personally reviewed PMH, PSH, PFH, medications, allergies, HM - Age-appropriate cancer screening: colonoscopy UTD, screening PSA ordered for testosterone monitoring - Influenza given today - Tdap UTD per patient - PHQ2 negative   Patient education and anticipatory guidance given Patient agrees with treatment plan Follow-up in 6 months for medication mgmt or sooner as needed if symptoms worsen or fail to improve  Darlyne Russian PA-C

## 2018-10-21 NOTE — Patient Instructions (Signed)
Chronic Kidney Disease, Adult Chronic kidney disease (CKD) occurs when the kidneys become damaged slowly over a long period of time. The kidneys are a pair of organs that do many important jobs in the body, including:  Removing waste and extra fluid from the blood to make urine.  Making hormones that maintain the amount of fluid in tissues and blood vessels.  Maintaining the right amount of fluids and chemicals in the body.  A small amount of kidney damage may not cause problems, but a large amount of damage may make it hard or impossible for the kidneys to work the way they should. If steps are not taken to slow down kidney damage or to stop it from getting worse, the kidneys may stop working permanently (end-stage renal disease or ESRD). Most of the time, CKD does not go away, but it can often be controlled. People who have CKD are usually able to live normal lives. What are the causes? The most common causes of this condition are diabetes and high blood pressure (hypertension). Other causes include:  Heart and blood vessel (cardiovascular) disease.  Kidney diseases, such as: ? Glomerulonephritis. ? Interstitial nephritis. ? Polycystic kidney disease. ? Renal vascular disease.  Diseases that affect the immune system.  Genetic diseases.  Medicines that damage the kidneys, such as anti-inflammatory medicines.  Being around or being in contact with poisonous (toxic) substances.  A kidney or urinary infection that occurs again and again (recurs).  Vasculitis. This is swelling or inflammation of the blood vessels.  A problem with urine flow that may be caused by: ? Cancer. ? Having kidney stones more than one time. ? An enlarged prostate, in males.  What increases the risk? You are more likely to develop this condition if you:  Are older than age 68.  Are male.  Are African-American, Hispanic, Asian, [place], or American [place].  Are a current or former  smoker.  Are obese.  Have a family history of kidney disease or failure.  Often take medicines that are damaging to the kidneys.  What are the signs or symptoms? Symptoms of this condition include:  Swelling (edema) of the face, legs, ankles, or feet.  Tiredness (lethargy) and having less energy.  Nausea or vomiting.  Confusion or trouble concentrating.  Problems with urination, such as: ? Painful or burning feeling during urination. ? Decreased urine production. ? Frequent urination, especially at night. ? Bloody urine.  Muscle twitches and cramps, especially in the legs.  Shortness of breath.  Weakness.  Loss of appetite.  Metallic taste in the mouth.  Trouble sleeping.  Dry, itchy skin.  A low blood count (anemia).  Pale lining of the eyelids and surface of the eye (conjunctiva).  Symptoms develop slowly and may not be obvious until the kidney damage becomes severe. It is possible to have kidney disease for years without having any symptoms. How is this diagnosed? This condition may be diagnosed based on:  Blood tests.  Urine tests.  Imaging tests, such as an ultrasound or CT scan.  A test in which a sample of tissue is removed from the kidneys to be examined under a microscope (kidney biopsy).  These test results will help your health care provider determine how serious the CKD is. How is this treated? There is no cure for most cases of this condition, but treatment usually relieves symptoms and prevents or slows the progression of the disease. Treatment may include:  Making diet changes, which may require you to  avoid alcohol, salty foods (sodium), and foods that are high in potassium, calcium, and protein.  Medicines: ? To lower blood pressure. ? To control blood glucose. ? To relieve anemia. ? To relieve swelling. ? To protect your bones. ? To improve the balance of electrolytes in your blood.  Removing toxic waste from the body through  types of dialysis, if the kidneys can no longer do their job (kidney failure).  Managing any other conditions that are causing your CKD or making it worse.  Follow these instructions at home: Medicines  Take over-the-counter and prescription medicines only as told by your health care provider. The dose of some medicines that you take may need to be adjusted.  Do not take any new medicines unless approved by your health care provider. Many medicines can worsen your kidney damage.  Do not take any vitamin and mineral supplements unless approved by your health care provider. Many nutritional supplements can worsen your kidney damage. General instructions  Follow your prescribed diet as told by your health care provider.  Do not use any products that contain nicotine or tobacco, such as cigarettes and e-cigarettes. If you need help quitting, ask your health care provider.  Monitor and track your blood pressure at home. Report changes in your blood pressure as told by your health care provider.  If you are being treated for diabetes, monitor and track your blood sugar (blood glucose) levels as told by your health care provider.  Maintain a healthy weight. If you need help with this, ask your health care provider.  Start or continue an exercise plan. Exercise at least 30 minutes a day, 5 days a week.  Keep your immunizations up to date as told by your health care provider.  Keep all follow-up visits as told by your health care provider. This is important. Where to find more information:  American Association of Kidney Patients: BombTimer.gl  National Kidney Foundation: www.kidney.[place]: https://mathis.com/  Life [facility]: www.lifeoptions.org and www.kidneyschool.org Contact a health care provider if:  Your symptoms get worse.  You develop new symptoms. Get help right away if:  You develop symptoms of ESRD, which  include: ? Headaches. ? Numbness in the hands or feet. ? Easy bruising. ? Frequent hiccups. ? Chest pain. ? Shortness of breath. ? Lack of menstruation, in women.  You have a fever.  You have decreased urine production.  You have pain or bleeding when you urinate. Summary  Chronic kidney disease (CKD) occurs when the kidneys become damaged slowly over a long period of time.  The most common causes of this condition are diabetes and high blood pressure (hypertension).  There is no cure for most cases of this condition, but treatment usually relieves symptoms and prevents or slows the progression of the disease. Treatment may include a combination of medicines and lifestyle changes. This information is not intended to replace advice given to you by your health care provider. Make sure you discuss any questions you have with your health care provider. Document Released: 09/17/2008 Document Revised: 01/16/2017 Document Reviewed: 01/16/2017 Elsevier Interactive Patient Education  [employer].

## 2018-10-22 ENCOUNTER — Encounter: Payer: Self-pay | Admitting: Physician Assistant

## 2018-10-22 DIAGNOSIS — N182 Chronic kidney disease, stage 2 (mild): Secondary | ICD-10-CM | POA: Insufficient documentation

## 2018-10-22 LAB — CBC
HCT: 44.9 % (ref 38.5–50.0)
Hemoglobin: 15.7 g/dL (ref 13.2–17.1)
MCH: 30.4 pg (ref 27.0–33.0)
MCHC: 35 g/dL (ref 32.0–36.0)
MCV: 87 fL (ref 80.0–100.0)
MPV: 10.6 fL (ref 7.5–12.5)
Platelets: 287 10*3/uL (ref 140–400)
RBC: 5.16 10*6/uL (ref 4.20–5.80)
RDW: 13 % (ref 11.0–15.0)
WBC: 7.4 10*3/uL (ref 3.8–10.8)

## 2018-10-22 LAB — RENAL PROFILE WITH ESTIMATED GFR
Albumin: 4.2 g/dL (ref 3.6–5.1)
BUN/Creatinine Ratio: 16 (calc) (ref 6–22)
BUN: 20 mg/dL (ref 7–25)
CO2: 29 mmol/L (ref 20–32)
Calcium: 9.4 mg/dL (ref 8.6–10.3)
Chloride: 104 mmol/L (ref 98–110)
Creat: 1.27 mg/dL — ABNORMAL HIGH (ref 0.70–1.25)
GFR, Est African American: 70 mL/min/{1.73_m2} (ref >=60)
GFR, Est Non African American: 60 mL/min/{1.73_m2} (ref >=60)
Glucose, Bld: 103 mg/dL — ABNORMAL HIGH (ref 65–99)
Phosphorus: 3.3 mg/dL (ref 2.5–4.5)
Potassium: 4.5 mmol/L (ref 3.5–5.3)
Sodium: 139 mmol/L (ref 135–146)

## 2018-10-22 LAB — MICROALBUMIN / CREATININE URINE RATIO
Creatinine, Urine: 74 mg/dL (ref 20–320)
Microalb Creat Ratio: 7 mcg/mg creat (ref <30)
Microalb, Ur: 0.5 mg/dL

## 2018-10-22 LAB — PSA: PSA: 1.6 ng/mL (ref <=4.0)

## 2018-10-22 LAB — TESTOSTERONE: Testosterone: 342 ng/dL (ref 250–827)

## 2018-12-09 DIAGNOSIS — D225 Melanocytic nevi of trunk: Secondary | ICD-10-CM | POA: Diagnosis not present

## 2018-12-09 DIAGNOSIS — D2271 Melanocytic nevi of right lower limb, including hip: Secondary | ICD-10-CM | POA: Diagnosis not present

## 2018-12-09 DIAGNOSIS — D3611 Benign neoplasm of peripheral nerves and autonomic nervous system of face, head, and neck: Secondary | ICD-10-CM | POA: Diagnosis not present

## 2018-12-09 DIAGNOSIS — Z1283 Encounter for screening for malignant neoplasm of skin: Secondary | ICD-10-CM | POA: Diagnosis not present

## 2018-12-11 ENCOUNTER — Ambulatory Visit: Payer: BLUE CROSS/BLUE SHIELD | Admitting: Internal Medicine

## 2018-12-29 ENCOUNTER — Encounter: Payer: Self-pay | Admitting: Physician Assistant

## 2018-12-29 MED ORDER — TESTOSTERONE 50 MG/5GM (1%) TD GEL
5.0000 g | TRANSDERMAL | 1 refills | Status: DC
Start: 2018-12-29 — End: 2019-01-06

## 2018-12-29 NOTE — Telephone Encounter (Signed)
Attempted to contact Pt twice to advise, phone line was busy.

## 2019-01-05 ENCOUNTER — Encounter: Payer: Self-pay | Admitting: Physician Assistant

## 2019-01-06 ENCOUNTER — Other Ambulatory Visit: Payer: Self-pay | Admitting: Internal Medicine

## 2019-01-06 ENCOUNTER — Other Ambulatory Visit: Payer: Self-pay | Admitting: Gastroenterology

## 2019-01-06 MED ORDER — TESTOSTERONE 50 MG/5GM (1%) TD GEL: 5.0000 g | TRANSDERMAL | 1 refills | Status: DC

## 2019-01-07 NOTE — Telephone Encounter (Signed)
Rx request sent to pharmacy.  

## 2019-01-13 ENCOUNTER — Other Ambulatory Visit: Payer: Self-pay | Admitting: *Deleted

## 2019-01-13 ENCOUNTER — Telehealth: Payer: Self-pay | Admitting: Internal Medicine

## 2019-01-13 DIAGNOSIS — E785 Hyperlipidemia, unspecified: Secondary | ICD-10-CM

## 2019-01-13 MED ORDER — ESOMEPRAZOLE MAGNESIUM 40 MG PO CPDR: DELAYED_RELEASE_CAPSULE | ORAL | 3 refills | Status: DC

## 2019-01-13 MED ORDER — EZETIMIBE 10 MG PO TABS: 10.0000 mg | ORAL_TABLET | Freq: Every day | ORAL | 0 refills | Status: DC

## 2019-01-13 NOTE — Addendum Note (Signed)
Addended by: Mahala Menghini on: 01/13/2019 04:36 PM   Modules accepted: Orders

## 2019-01-13 NOTE — Telephone Encounter (Signed)
Requested Prescriptions   Signed Prescriptions Disp Refills  . ezetimibe (ZETIA) 10 MG tablet 90 tablet 0    Sig: Take 1 tablet (10 mg total) by mouth daily.    Authorizing Provider: Pixie Casino    Ordering User: Britt Bottom

## 2019-01-13 NOTE — Telephone Encounter (Signed)
Noted. Pt needs RX sent to his pharmacy.

## 2019-01-13 NOTE — Telephone Encounter (Signed)
[id number] patent called and said he needs a prescription sent in for nexium to [facility] [place].  On [address]

## 2019-01-13 NOTE — Telephone Encounter (Signed)
° ° ° °*  STAT* If patient is at the pharmacy, call can be transferred to refill team.   1. Which medications need to be refilled? (please list name of each medication and dose if known) ezetimibe (ZETIA) 10 MG tablet  2. Which pharmacy/location (including street and city if local pharmacy) is medication to be sent to? [id number] [place] - [address]  3. Do they need a 30 day or 90 day supply? [address] [place]

## 2019-02-25 ENCOUNTER — Other Ambulatory Visit: Payer: Self-pay | Admitting: Pharmacist Clinician (PhC)/ Clinical Pharmacy Specialist

## 2019-02-25 ENCOUNTER — Encounter: Payer: Self-pay | Admitting: Internal Medicine

## 2019-02-25 ENCOUNTER — Ambulatory Visit: Payer: BLUE CROSS/BLUE SHIELD | Admitting: Internal Medicine

## 2019-02-25 VITALS — BP 128/86 | HR 72 | Ht 69.0 in | Wt 185.2 lb

## 2019-02-25 DIAGNOSIS — R931 Abnormal findings on diagnostic imaging of heart and coronary circulation: Secondary | ICD-10-CM

## 2019-02-25 DIAGNOSIS — I4510 Unspecified right bundle-branch block: Secondary | ICD-10-CM | POA: Diagnosis not present

## 2019-02-25 DIAGNOSIS — E785 Hyperlipidemia, unspecified: Secondary | ICD-10-CM

## 2019-02-25 DIAGNOSIS — Z79899 Other long term (current) drug therapy: Secondary | ICD-10-CM | POA: Diagnosis not present

## 2019-02-25 DIAGNOSIS — Z789 Other specified health status: Secondary | ICD-10-CM

## 2019-02-25 MED ORDER — EVOLOCUMAB 140 MG/ML ~~LOC~~ SOAJ: 1.0000 | SUBCUTANEOUS | 11 refills | Status: DC

## 2019-02-25 NOTE — Patient Instructions (Signed)
Medication Instructions:  Your Physician recommend you continue on your current medication as directed.    If you need a refill on your cardiac medications before your next appointment, please call your pharmacy.   Lab work: Your physician recommends that you return for lab work in 4 months ( fasting lipids)  If you have labs (blood work) drawn today and your tests are completely normal, you will receive your results only by: Marland Kitchen MyChart Message (if you have MyChart) OR . A paper copy in the mail If you have any lab test that is abnormal or we need to change your treatment, we will call you to review the results.  Testing/Procedures: None  Follow-Up: At [facility], you and your health needs are our priority.  As part of our continuing mission to provide you with exceptional heart care, we have created designated Provider Care Teams.  These Care Teams include your primary Cardiologist (physician) and Advanced Practice Providers (APPs -  Physician Assistants and Nurse Practitioners) who all work together to provide you with the care you need, when you need it. You will need a follow up appointment in 6 months.  Please call our office 2 months in advance to schedule this appointment.  You may see Pixie Casino, MD or one of the following Advanced Practice Providers on your designated Care Team: [place], [place] . Fabian Sharp, PA-C

## 2019-02-25 NOTE — Progress Notes (Signed)
OFFICE NOTE  Chief Complaint: Follow-up dyslipidemia  Primary Care Physician: Trixie Dredge, PA-C  HPI:  Tyler Gates this pleasant 63 year old male that I last saw in 2014. He has a history of hypertension and marked dyslipidemia and has been intolerant to statins. He's an avid cyclist but does have a family history of heart disease which is significant and his father had coronary events at an early age. He underwent metabolic testing in December 2013 which showed an RER of 1.08, peak VO2 01 14% predicted which was excellent. At the time he had good control over his blood pressure however recently he's been having problems with low blood pressure. He says in the fall his blood pressure was elevated and he was switched from Exforge 10/320 mg daily to valsartan 320 mg daily. During bike rides an exercise he felt that he would get dizzy at times and thought that his blood pressure possibly was getting low. He did see some systolic blood pressures in the 90s. Subsequently he was changed over to Azor 5/40 mg daily. He says he's uncomfortable with this medicine and has some unusual side effects including dizziness. He unfortunately has not gotten treatment of his dyslipidemia which was remarkable. His total cholesterol was 266, with triglycerides 439, HDL 41 and LDL particle #2758 in 2014. He denies any chest pain or worsening shortness of breath with exertion.  Tyler Gates returns today for follow-up. He reports some continued lability in his blood pressures. He felt that the valsartan was working fairly well but he still has some problems with dizziness and lower blood pressure. He saw Cyril Mourning, our pharmacist who made adjustments in his medications. He has since gone back to [name]. He feels like the worst time is when he takes the medicine and doesn't ride or bicycling within a couple hours of taking the medicine and he feels dizzy and her has low blood pressure. He then has had a few  episodes were his blood pressures been elevated. Once when he was on a trip to [place] and in the car for several hours. He was under a lot of stress and blood pressure got up to the 170s which he had a headache.  I saw Tyler Gates back in the office today. After some adjustments on his medicines last time he feels that he now has good control of his blood pressure. Today the office it was 122/80. He's currently taking a Benicar 40 mg daily and amlodipine 2.5 mg daily. He says that his blood pressures are not as labile as they used to be and he is not having any symptoms such as dizziness or fatigue.  Tyler Gates returns today for follow-up. Overall he is feeling well. He says his blood pressure is running a little bit higher but he's been on of his Benicar for a few days. He denies any chest pain or shortness of breath. He reports he stopped taking Zetia due to cost issues over the past year and has been out of it for several months. He was due for recheck lipid profile today. A routine EKG was performed in the office which shows a new right bundle branch block. This was not present on his prior EKG in May 2016. He tells me however he is asymptomatic and in fact has gone on to very long bike rides over the past several weeks without any change in exercise tolerance.  06/28/2016  Tyler Gates was seen back today in the office for follow-up. He continues to  cycle and exercise regularly and is without cardiac complaints. Blood pressure appears to be fairly well-controlled although he has persistent diastolic hypertension. At his last office visit I started him on ezetimibe due to intolerance of statins and he seems to be taking that without issues. He recently had blood work through his primary care provider which we will request today. He indicated that his cholesterol was down but not optimized.  01/03/2017  Tyler Gates returns today for follow-up. In the interim he is mesh lose about 10 pounds. Unfortunately his blood pressure  still remains a little bit elevated as far as the diastolic blood pressure is concerned. He was previously on Benicar 40 mg daily however that has been reduced to 20 mg. He is also taking amlodipine 5 mg instead of 2.5 mg daily. He continues to cycle and denies any chest pain or worsening shortness of breath. He is unsteady and has had statin intolerance in the past. He's overdue for repeat lipid profile.  10/30/2017  Tyler Gates returns today for follow-up.  I last saw him in January.  He reports he had a good summer and one several mountain bike races.  He unfortunately had a fall over the summer and fractured his femoral neck.  This required screws and a plate to repair.  He is recovered from that and has been concerned recently about his seasonal elevation in blood pressure.  He says in the summer he took several readings showing low blood pressure and managed it with the lower dose amlodipine.  Typically in the winter he increases the dose of his amlodipine for higher blood pressures and notes that he gets headache and symptoms when his blood pressure is elevated.  He told me recently, including even this morning that his blood pressure was quite high in the morning.  He inquired about whether he would benefit from a low-dose diuretic.  05/22/2018  Tyler Gates is seen today in follow-up.  He is overall continuing to do well.  He is exercising regularly and continues to cycle.  Over the summer he has managed with lower dose amlodipine and blood pressure today was well controlled 115/76.  He remains asymptomatic, denies any chest pain or worsening shortness of breath.  Of note, lab work in May 2018 showed total cholesterol 268, HDL 38, LDL 176 and triglycerides 271.  This represents marked dyslipidemia although he has no known coronary disease or cardiovascular disease in the past.  There is family history of heart disease mostly on his mother side.  He reports that he was told he had high cholesterol even in  his 63s and has unfortunately been intolerant of statins.  He is tried both atorvastatin, rosuvastatin, pravastatin and lovastatin.   08/10/2018  Tyler Gates returns today for follow-up.  He underwent a repeat lipid profile recently which showed total cholesterol 258, triglycerides 326, HDL 36 and LDL 157.  In fact he brought in lab work from about 30 years ago which indicated similar numbers.  He underwent CT scan calcium scoring which showed calcifications in the mid RCA and LAD with a calcium score of 410.  This is 85th percentile for age and sex matched control.  Given his high CT coronary calcium score, perfusion imaging was recommended.  In addition was found to have a mildly dilated ascending aorta 3.9 cm.  An incidental finding of a hypoattenuating signal in the liver was noted, most consistent with a cyst per radiology.  Unfortunately he has been intolerant of statins and has  not had significant benefit in reducing his lipid profile on ezetimibe.  09/14/2018  Tyler Gates is seen today in follow-up.  He underwent nuclear stress testing which was low risk for ischemia.  LVEF was slightly reduced however abnormal gating is likely responsible for this.  It appeared visually normal.  He is a coronary calcium score is quite high.  His LDL is well above goal on ezetimibe.  Unfortunately he has been intolerant to statins.  At this point I would recommend pursuing PCSK9 inhibitor.  This could make a significant reduction in cholesterol and his cardiovascular risk.  After much discussion today seemed to be agreeable to consider this therapy.  02/25/2019  Tyler Gates returns today for follow-up of lipid therapy.  I last saw him we discussed starting Repatha.  He received prior authorization approval for this.  His co-pay cost was only $40.  He did receive 1 injector however he had not used it.  Unfortunately he is not started using the injections.  Is not clear if he had prior authorization through the end of this  year or last year.  I will discuss with our pharmacist to look into this further.  He has now moved to [facility] and is using a new pharmacy.  We have updated this information.  PMHx:  Past Medical History:  Diagnosis Date  . Crohn's disease ([place])   . Depression   . Dyspnea on exertion   . GERD (gastroesophageal reflux disease)   . Herpes   . Hyperlipidemia   . Hypertension   . Instability of knee joint   . Left displaced femoral neck fracture ([place]) 07/11/2017  . Snoring     Past Surgical History:  Procedure Laterality Date  . COLONOSCOPY  2007   Dr. Sabino Donovan normal TI, ascending colon/cecum with mild inflammation noted but biopses without signs of active inflammation, hyperpastic polyp, possible ischemia on path of left colon biopsy  . COLONOSCOPY N/A 11/08/2015   Procedure: COLONOSCOPY;  Surgeon: [name], MD;  Location: [facility];  Service: Endoscopy;  Laterality: N/A;  . ESOPHAGOGASTRODUODENOSCOPY  2007   Dr. Sabino Donovan reflux esophagitis, hiatal hernia, negative H.pylori  . ESOPHAGOGASTRODUODENOSCOPY N/A 11/08/2015   Procedure: ESOPHAGOGASTRODUODENOSCOPY (EGD);  Surgeon: [name], MD;  Location: [facility];  Service: Endoscopy;  Laterality: N/A;  0830 - moved to 8:15 - office to notify  . HIP PINNING,CANNULATED Left 07/12/2017   Procedure: CANNULATED HIP PINNING LEFT;  Surgeon: Rod Can, MD;  Location: [facility];  Service: Orthopedics;  Laterality: Left;    FAMHx:  Family History  Problem Relation Age of Onset  . Melanoma Father   . Heart disease Father   . Healthy Mother   . Colon cancer Neg Hx     SOCHx:   reports that he has never smoked. He has never used smokeless tobacco. He reports current alcohol use. He reports that he does not use drugs.  ALLERGIES:  Allergies  Allergen Reactions  . Statins     REACTION: Myalgia    ROS: Pertinent items noted in HPI and remainder of comprehensive ROS otherwise negative.  HOME MEDS: Current Outpatient  Medications  Medication Sig Dispense Refill  . amLODipine (NORVASC) 2.5 MG tablet TAKE 1 TABLET TWICE A DAY 180 tablet 3  . budesonide (ENTOCORT EC) 3 MG 24 hr capsule Take 1 capsule (3 mg total) by mouth daily. 90 capsule 1  . esomeprazole (NEXIUM) 40 MG capsule TAKE 1 CAPSULE (40MG TOTAL)TWICE A DAY before a  meal 180 capsule 3  . ezetimibe (ZETIA) 10 MG tablet Take 1 tablet (10 mg total) by mouth daily. 90 tablet 0  . olmesartan (BENICAR) 20 MG tablet Take 1 tablet (20 mg total) by mouth 2 (two) times daily. 180 tablet 3  . testosterone (ANDROGEL) 50 MG/5GM (1%) GEL Place 5 g onto the skin every morning. 450 g 1  . venlafaxine (EFFEXOR) 37.5 MG tablet Take 1 tablet (37.5 mg total) by mouth 2 (two) times daily. Take 1 tab twice a day 180 tablet 1  . Evolocumab (REPATHA SURECLICK) 403 MG/ML SOAJ Inject 1 Dose into the skin every 14 (fourteen) days. (Patient not taking: Reported on 02/25/2019) 2 pen 11   No current facility-administered medications for this visit.     LABS/IMAGING: No results found for this or any previous visit (from the past 48 hour(s)). No results found.  VITALS: BP 128/86   Pulse 72   Ht 5' 9"  (1.753 m)   Wt 185 lb 3.2 oz (84 kg)   BMI 27.35 kg/m   EXAM: General appearance: alert and no distress Neck: no carotid bruit, no JVD and thyroid not enlarged, symmetric, no tenderness/mass/nodules Lungs: clear to auscultation bilaterally Heart: regular rate and rhythm, S1, S2 normal, no murmur, click, rub or gallop Abdomen: soft, non-tender; bowel sounds normal; no masses,  no organomegaly Extremities: extremities normal, atraumatic, no cyanosis or edema Pulses: 2+ and symmetric Skin: Skin color, texture, turgor normal. No rashes or lesions Neurologic: Grossly normal Psych: Pleasant  EKG: Normal sinus rhythm, incomplete right bundle branch block at 72-personally reviewed  ASSESSMENT: 1. High CAC score-410 2. Labile hypertension 3. Dyslipidemia -? familial  hyperlipidemia 4. Alternating RBBB and iRBBB, asymptomatic 5. Statin intolerant 6. Low risk myoview stress test (07/2018)  PLAN: 1.   Tyler Gates has yet to start on Repatha.  He did receive 1 injector from [facility] however they transition to local pharmacies and he has not pursued it because he did not have a new prescription.  Is not clear whether he will need new prior authorization for 2020.  I will defer this to our pharmacy staff is assisting [place] with this.  I encouraged him to use that dose which she will this weekend after his last winter race tomorrow.  Plan then follow-up with me in about 6 months and will repeat lipids in 4 months fasting.  Pixie Casino, MD, [facility], [place] Director of the Advanced Lipid Disorders &  Cardiovascular Risk Reduction [facility] Diplomate of the American Board of Clinical Lipidology Attending Cardiologist  Direct Dial: [phone]  Fax: [phone]  Website:  [email or web address] 02/25/2019, 11:10 AM

## 2019-03-12 ENCOUNTER — Other Ambulatory Visit: Payer: Self-pay

## 2019-03-12 MED ORDER — AMLODIPINE BESYLATE 2.5 MG PO TABS: 2.5000 mg | ORAL_TABLET | Freq: Two times a day (BID) | ORAL | 3 refills | Status: DC

## 2019-03-16 ENCOUNTER — Other Ambulatory Visit: Payer: Self-pay

## 2019-04-11 ENCOUNTER — Other Ambulatory Visit: Payer: Self-pay | Admitting: Internal Medicine

## 2019-04-11 DIAGNOSIS — E785 Hyperlipidemia, unspecified: Secondary | ICD-10-CM

## 2019-04-12 NOTE — Telephone Encounter (Signed)
Zetia 10 mg refilled.

## 2019-04-22 ENCOUNTER — Other Ambulatory Visit: Payer: Self-pay

## 2019-04-22 DIAGNOSIS — F3340 Major depressive disorder, recurrent, in remission, unspecified: Secondary | ICD-10-CM

## 2019-04-22 MED ORDER — VENLAFAXINE HCL 37.5 MG PO TABS: 37.5000 mg | ORAL_TABLET | Freq: Two times a day (BID) | ORAL | 0 refills | Status: DC

## 2019-04-22 MED ORDER — AMLODIPINE BESYLATE 2.5 MG PO TABS: 2.5000 mg | ORAL_TABLET | Freq: Two times a day (BID) | ORAL | 3 refills | Status: DC

## 2019-04-29 NOTE — Telephone Encounter (Signed)
Per covermymeds.com, no new PA for Repatha is needed at this time (message received: Your PA has been resolved, no additional PA is required). Patient has not started medication. Advised to notify office when/if he decides to start this.

## 2019-05-16 ENCOUNTER — Other Ambulatory Visit: Payer: Self-pay | Admitting: Physician Assistant

## 2019-05-16 DIAGNOSIS — F3340 Major depressive disorder, recurrent, in remission, unspecified: Secondary | ICD-10-CM

## 2019-05-19 ENCOUNTER — Ambulatory Visit (INDEPENDENT_AMBULATORY_CARE_PROVIDER_SITE_OTHER): Payer: BLUE CROSS/BLUE SHIELD

## 2019-05-19 ENCOUNTER — Ambulatory Visit: Payer: BLUE CROSS/BLUE SHIELD | Admitting: Podiatry

## 2019-05-19 ENCOUNTER — Other Ambulatory Visit: Payer: Self-pay

## 2019-05-19 ENCOUNTER — Encounter: Payer: Self-pay | Admitting: Podiatry

## 2019-05-19 VITALS — BP 125/80 | HR 69 | Temp 97.7°F | Resp 16

## 2019-05-19 DIAGNOSIS — M2011 Hallux valgus (acquired), right foot: Secondary | ICD-10-CM | POA: Diagnosis not present

## 2019-05-19 DIAGNOSIS — B351 Tinea unguium: Secondary | ICD-10-CM | POA: Diagnosis not present

## 2019-05-19 MED ORDER — TERBINAFINE HCL 250 MG PO TABS: 250.0000 mg | ORAL_TABLET | Freq: Every day | ORAL | 0 refills | Status: DC

## 2019-05-19 NOTE — Progress Notes (Signed)
   Subjective:    Patient ID: Tyler Gates, male    DOB: 1956/07/03, 63 y.o.   MRN: [id number]  HPI    Review of Systems  All other systems reviewed and are negative.      Objective:   Physical Exam        Assessment & Plan:

## 2019-05-20 ENCOUNTER — Ambulatory Visit: Payer: BLUE CROSS/BLUE SHIELD | Admitting: Physician Assistant

## 2019-05-20 ENCOUNTER — Encounter: Payer: Self-pay | Admitting: Physician Assistant

## 2019-05-20 VITALS — BP 129/78 | HR 78 | Temp 97.8°F | Wt 179.0 lb

## 2019-05-20 DIAGNOSIS — F3340 Major depressive disorder, recurrent, in remission, unspecified: Secondary | ICD-10-CM

## 2019-05-20 DIAGNOSIS — R931 Abnormal findings on diagnostic imaging of heart and coronary circulation: Secondary | ICD-10-CM | POA: Diagnosis not present

## 2019-05-20 DIAGNOSIS — N182 Chronic kidney disease, stage 2 (mild): Secondary | ICD-10-CM | POA: Diagnosis not present

## 2019-05-20 DIAGNOSIS — I10 Essential (primary) hypertension: Secondary | ICD-10-CM

## 2019-05-20 DIAGNOSIS — Z79899 Other long term (current) drug therapy: Secondary | ICD-10-CM | POA: Diagnosis not present

## 2019-05-20 DIAGNOSIS — E785 Hyperlipidemia, unspecified: Secondary | ICD-10-CM

## 2019-05-20 DIAGNOSIS — Z1159 Encounter for screening for other viral diseases: Secondary | ICD-10-CM

## 2019-05-20 DIAGNOSIS — I129 Hypertensive chronic kidney disease with stage 1 through stage 4 chronic kidney disease, or unspecified chronic kidney disease: Secondary | ICD-10-CM

## 2019-05-20 DIAGNOSIS — D1779 Benign lipomatous neoplasm of other sites: Secondary | ICD-10-CM

## 2019-05-20 MED ORDER — ASPIRIN EC 81 MG PO TBEC: 81.0000 mg | DELAYED_RELEASE_TABLET | Freq: Every day | ORAL | 3 refills | Status: DC

## 2019-05-20 MED ORDER — VENLAFAXINE HCL 37.5 MG PO TABS: 37.5000 mg | ORAL_TABLET | Freq: Two times a day (BID) | ORAL | 1 refills | Status: DC

## 2019-05-20 NOTE — Progress Notes (Signed)
Subjective:   Patient ID: Tyler Gates, male   DOB: 63 y.o.   MRN: [id number]   HPI Patient presents stating he has a lot of problems with his nail and the left big toenail at times is sore and thickened and he had taken Lamisil in the past which seemed to help him and he is interested in doing it again.  Patient also complains of mild discomfort around the first metatarsal head right.  Patient does not smoke likes to be active   Review of Systems  All other systems reviewed and are negative.       Objective:  Physical Exam Vitals signs and nursing note reviewed.  Constitutional:      Appearance: He is well-developed.  Pulmonary:     Effort: Pulmonary effort is normal.  Musculoskeletal: Normal range of motion.  Skin:    General: Skin is warm.  Neurological:     Mental Status: He is alert.     Neurovascular status was found to be intact muscle strength is adequate with patient found to have mild prominence around the first metatarsal head right left of motion noted at the joint surface.  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  Patient has good digital perfusion well oriented x3     Assessment:  Appears to be mycotic type infection of his nailbeds with probability of trauma being part of the factor that he is experiencing.  Also has mild hallux limitus with possible bone spur formation first metatarsal right     Plan:  H&P educated him on mycotic disease and also trauma and that of the right hallux nail remains painful ultimately removal may be necessary.  We will start him on oral antifungal for 3 months and he is going to have liver function but not denies wanting it now as he is seen his family physician for regular visit in the next week and we will get a metabolic panel and when he gets results he will send them to [place].  He  understands that we do need this liver function but he refuses to get it today and wants to get it done by his family physician.  Patient will be seen back 6 months or earlier if needed  X-ray indicates there is elongated first metatarsal segment right with moderate elevation of the first metatarsal bone localized in nature

## 2019-05-20 NOTE — Progress Notes (Signed)
HPI:                                                                Tyler Gates is a 63 y.o. male who presents to [facility] today for anxiety/depression follow-up  Hypertension/HLD: Followed by cardiology, Dr. Debara Pickett.  He is taking olmesartan 20 mg in the morning and amlodipine 2-1/2 mg at bedtime.   He states that his blood pressure varies widely and he is wondering the reason for this.  Reports after a very challenging workout his blood pressure was 93/63.  His home blood pressure is typically between 120 and 140/ 75-mid 80s He has not started taking Repatha.  He states he is really nervous to take a medication that will be in his system for 2 weeks and that he cannot discontinue.  He has a history of myalgia and gastrointestinal symptoms with statins.  He is not taking baby aspirin.  Depression/anxiety: Taking Effexor 37-1/2 mg twice a day.  Compliant with medications.  Endorses some mild anxiety related to the COVID-19 pandemic.  Denies depressed mood or anhedonia.  He occasionally has some concentration and memory difficulties.  For example states that he will be cooking breakfast and going to the other room, not realizing that he already turned the stove off.  Denies any change in his working memory or other cognitive symptoms.  No family history of dementia.  He was evaluated by [facility] and will be starting Lamisil for onychomycosis of the toenails.  He is requesting liver function test today.  He has not yet started the medication.  Denies prior history of liver disease.  He also states he has had a lump in his left upper groin area for many many years.  Lump is nontender and does not particularly bother him or interfere with his activities.  He is curious what this could be   Depression screen [facility] 2/9 05/20/2019 10/21/2018  Decreased Interest 0 0  Down, Depressed, Hopeless 0 0  PHQ - 2 Score 0 0  Altered sleeping 0  0  Tired, decreased energy 0 0  Change in appetite 0 0  Feeling bad or failure about yourself  0 0  Trouble concentrating 1 0  Moving slowly or fidgety/restless 0 0  Suicidal thoughts 0 0  PHQ-9 Score 1 0  Difficult doing work/chores Not difficult at all Not difficult at all    GAD 7 : Generalized Anxiety Score 05/20/2019 10/21/2018  Nervous, Anxious, on Edge 1 1  Control/stop worrying 0 0  Worry too much - different things 0 0  Trouble relaxing 1 1  Restless 0 0  Easily annoyed or irritable 1 1  Afraid - awful might happen 0 0  Total GAD 7 Score 3 3  Anxiety Difficulty Not difficult at all Not difficult at all      Past Medical History:  Diagnosis Date  . Crohn's disease ([place])   . Depression   . Dyspnea on exertion   . GERD (gastroesophageal reflux disease)   . Herpes   . Hyperlipidemia   . Hypertension   . Instability of knee joint   . Left displaced femoral neck fracture ([place]) 07/11/2017  . Snoring    Past  Surgical History:  Procedure Laterality Date  . COLONOSCOPY  2007   Dr. Sabino Donovan normal TI, ascending colon/cecum with mild inflammation noted but biopses without signs of active inflammation, hyperpastic polyp, possible ischemia on path of left colon biopsy  . COLONOSCOPY N/A 11/08/2015   Procedure: COLONOSCOPY;  Surgeon: [name], MD;  Location: [facility];  Service: Endoscopy;  Laterality: N/A;  . ESOPHAGOGASTRODUODENOSCOPY  2007   Dr. Sabino Donovan reflux esophagitis, hiatal hernia, negative H.pylori  . ESOPHAGOGASTRODUODENOSCOPY N/A 11/08/2015   Procedure: ESOPHAGOGASTRODUODENOSCOPY (EGD);  Surgeon: [name], MD;  Location: [facility];  Service: Endoscopy;  Laterality: N/A;  0830 - moved to 8:15 - office to notify  . HIP PINNING,CANNULATED Left 07/12/2017   Procedure: CANNULATED HIP PINNING LEFT;  Surgeon: Rod Can, MD;  Location: [facility];  Service: Orthopedics;  Laterality: Left;   Social History   Tobacco Use  . Smoking status: Never Smoker  .  Smokeless tobacco: Never Used  Substance Use Topics  . Alcohol use: Yes    Alcohol/week: 0.0 standard drinks    Comment: glass a wine once a week   family history includes Healthy in his mother; Heart disease in his father; Melanoma in his father.    ROS: negative except as noted in the HPI  Medications: Current Outpatient Medications  Medication Sig Dispense Refill  . amLODipine (NORVASC) 2.5 MG tablet Take 1 tablet (2.5 mg total) by mouth 2 (two) times daily. 180 tablet 3  . budesonide (ENTOCORT EC) 3 MG 24 hr capsule Take 1 capsule (3 mg total) by mouth daily. 90 capsule 1  . esomeprazole (NEXIUM) 40 MG capsule TAKE 1 CAPSULE (40MG TOTAL)TWICE A DAY before a meal 180 capsule 3  . ezetimibe (ZETIA) 10 MG tablet TAKE 1 TABLET BY MOUTH EVERY DAY 90 tablet 0  . olmesartan (BENICAR) 20 MG tablet Take 1 tablet (20 mg total) by mouth 2 (two) times daily. 180 tablet 3  . terbinafine (LAMISIL) 250 MG tablet Take 1 tablet (250 mg total) by mouth daily. 90 tablet 0  . testosterone (ANDROGEL) 50 MG/5GM (1%) GEL Place 5 g onto the skin every morning. 450 g 1  . venlafaxine (EFFEXOR) 37.5 MG tablet Take 1 tablet (37.5 mg total) by mouth 2 (two) times daily. 180 tablet 1  . aspirin EC 81 MG tablet Take 1 tablet (81 mg total) by mouth daily. 90 tablet 3   No current facility-administered medications for this visit.    Allergies  Allergen Reactions  . Statins     REACTION: Myalgia       Objective:  BP 129/78   Pulse 78   Temp 97.8 F (36.6 C) (Oral)   Wt 179 lb (81.2 kg)   BMI 26.43 kg/m   Vitals:   05/20/19 0848 05/20/19 0913  BP: 140/87 129/78  Pulse: 76 78  Temp: 97.8 F (36.6 C)   Gen:  alert, not ill-appearing, no distress, appropriate for age 19: head normocephalic without obvious abnormality, conjunctiva and cornea clear, trachea midline Pulm: Normal work of breathing, normal phonation Neuro: alert and oriented x 3, no tremor MSK: extremities atraumatic, normal gait  and station Skin: intact, no rashes on exposed skin, left upper groin there is approximately 2 cm x 1 cm soft subcutaneous mass Psych: well-groomed, cooperative, good eye contact, euthymic mood, affect mood-congruent, speech is articulate, and thought processes clear and goal-directed  BP Readings from Last 3 Encounters:  05/20/19 129/78  05/19/19 125/80  02/25/19 128/86  Wt Readings from Last 3 Encounters:  05/20/19 179 lb (81.2 kg)  02/25/19 185 lb 3.2 oz (84 kg)  10/21/18 185 lb (83.9 kg)   Lab Results  Component Value Date   CREATININE 1.27 (H) 10/21/2018   BUN 20 10/21/2018   NA 139 10/21/2018   K 4.5 10/21/2018   CL 104 10/21/2018   CO2 29 10/21/2018   Lab Results  Component Value Date   ALT 27 07/11/2017   AST 29 07/11/2017   ALKPHOS 59 07/11/2017   BILITOT 0.9 07/11/2017   Lab Results  Component Value Date   WBC 7.4 10/21/2018   HGB 15.7 10/21/2018   HCT 44.9 10/21/2018   MCV 87.0 10/21/2018   PLT 287 10/21/2018   Lab Results  Component Value Date   CHOL 258 (H) 08/06/2018   HDL 36 (L) 08/06/2018   LDLCALC 157 (H) 08/06/2018   TRIG 326 (H) 08/06/2018   CHOLHDL 7.2 (H) 08/06/2018   The 10-year ASCVD risk score Mikey Bussing DC Jr., et al., 2013) is: 19%   Values used to calculate the score:     Age: 15 years     Sex: Male     Is Non-Hispanic African American: No     Diabetic: No     Tobacco smoker: No     Systolic Blood Pressure: 413 mmHg     Is BP treated: Yes     HDL Cholesterol: 36 mg/dL     Total Cholesterol: 258 mg/dL    Assessment and Plan: 63 y.o. male with   .Reginold was seen today for depression.  Diagnoses and all orders for this visit:  Chronic kidney disease (CKD) stage G2/A2, mildly decreased glomerular filtration rate (GFR) between 60-89 mL/min/1.73 square meter and albuminuria creatinine ratio between 30-299 mg/g  Depression, major, recurrent, in remission (HCC) -     venlafaxine (EFFEXOR) 37.5 MG tablet; Take 1 tablet (37.5 mg  total) by mouth 2 (two) times daily.  Other long term (current) drug therapy -     Hepatic function panel -     Renal Profile with Estimated GFR -     Lipid Panel w/reflex Direct LDL  Agatston coronary artery calcium score greater than 400 -     aspirin EC 81 MG tablet; Take 1 tablet (81 mg total) by mouth daily.  Chronic kidney disease due to benign hypertension -     Renal Profile with Estimated GFR  Hyperlipidemia, unspecified hyperlipidemia type -     Lipid Panel w/reflex Direct LDL  Essential hypertension -     aspirin EC 81 MG tablet; Take 1 tablet (81 mg total) by mouth daily.  Encounter for hepatitis C screening test for low risk patient -     Hepatitis C antibody   Patient to return for fasting lab work  Advised him to begin baby aspirin for primary prevention (10 yr ASCVD risk 19%).  Also recommended that he discuss Livalo with his cardiologist.  Fasting lipids pending  We discussed the multitude of reasons for blood pressure variation.  Blood pressure will fluctuate with activity levels.  I advised him to monitor his blood pressure around the same time in the morning and evening and not immediately after vigorous activity.  Continue olmesartan in the morning and amlodipine in the evening.  We discussed that mass of the groin is most likely a benign lipoma.  Patient deferred ultrasound today.  Given that mass is soft, asymptomatic, has been present and unchanged for years is reassuring.  Counseled on active surveillance  Patient education and anticipatory guidance given Patient agrees with treatment plan Follow-up in 6 months or sooner as needed if symptoms worsen or fail to improve  Darlyne Russian PA-C

## 2019-05-20 NOTE — Patient Instructions (Signed)
Lipoma  A lipoma is a noncancerous (benign) tumor that is made up of fat cells. This is a very common type of soft-tissue growth. Lipomas are usually found under the skin (subcutaneous). They may occur in any tissue of the body that contains fat. Common areas for lipomas to appear include the back, shoulders, buttocks, and thighs.  Lipomas grow slowly, and they are usually painless. Most lipomas do not cause problems and do not require treatment. What are the causes? The cause of this condition is not known. What increases the risk? You are more likely to develop this condition if:  You are 69-40 years old.  You have a family history of lipomas. What are the signs or symptoms? A lipoma usually appears as a small, round bump under the skin. In most cases, the lump will:  Feel soft or rubbery.  Not cause pain or other symptoms. However, if a lipoma is located in an area where it pushes on nerves, it can become painful or cause other symptoms. How is this diagnosed? A lipoma can usually be diagnosed with a physical exam. You may also have tests to confirm the diagnosis and to rule out other conditions. Tests may include:  Imaging tests, such as a CT scan or MRI.  Removal of a tissue sample to be looked at under a microscope (biopsy). How is this treated? Treatment for this condition depends on the size of the lipoma and whether it is causing any symptoms.  For small lipomas that are not causing problems, no treatment is needed.  If a lipoma is bigger or it causes problems, surgery may be done to remove the lipoma. Lipomas can also be removed to improve appearance. Most often, the procedure is done after applying a medicine that numbs the area (local anesthetic). Follow these instructions at home:  Watch your lipoma for any changes.  Keep all follow-up visits as told by your health care provider. This is important. Contact a health care provider if:  Your lipoma becomes larger or  hard.  Your lipoma becomes painful, red, or increasingly swollen. These could be signs of infection or a more serious condition. Get help right away if:  You develop tingling or numbness in an area near the lipoma. This could indicate that the lipoma is causing nerve damage. Summary  A lipoma is a noncancerous tumor that is made up of fat cells.  Most lipomas do not cause problems and do not require treatment.  If a lipoma is bigger or it causes problems, surgery may be done to remove the lipoma. This information is not intended to replace advice given to you by your health care provider. Make sure you discuss any questions you have with your health care provider. Document Released: 11/29/2002 Document Revised: 11/25/2017 Document Reviewed: 11/25/2017 Elsevier Interactive Patient Education  2019 [employer].

## 2019-05-28 DIAGNOSIS — E785 Hyperlipidemia, unspecified: Secondary | ICD-10-CM | POA: Diagnosis not present

## 2019-05-28 DIAGNOSIS — Z79899 Other long term (current) drug therapy: Secondary | ICD-10-CM | POA: Diagnosis not present

## 2019-05-28 DIAGNOSIS — Z1159 Encounter for screening for other viral diseases: Secondary | ICD-10-CM | POA: Diagnosis not present

## 2019-05-28 DIAGNOSIS — I129 Hypertensive chronic kidney disease with stage 1 through stage 4 chronic kidney disease, or unspecified chronic kidney disease: Secondary | ICD-10-CM | POA: Diagnosis not present

## 2019-05-29 LAB — HEPATIC FUNCTION PANEL
AG Ratio: 1.5 (calc) (ref 1.0–2.5)
ALT: 15 U/L (ref 9–46)
AST: 15 U/L (ref 10–35)
Albumin: 4 g/dL (ref 3.6–5.1)
Alkaline phosphatase (APISO): 66 U/L (ref 35–144)
Bilirubin, Direct: 0.1 mg/dL (ref 0.0–0.2)
Globulin: 2.7 g/dL (calc) (ref 1.9–3.7)
Indirect Bilirubin: 0.3 mg/dL (calc) (ref 0.2–1.2)
Total Bilirubin: 0.4 mg/dL (ref 0.2–1.2)
Total Protein: 6.7 g/dL (ref 6.1–8.1)

## 2019-05-29 LAB — RENAL PROFILE WITH ESTIMATED GFR
Albumin: 4 g/dL (ref 3.6–5.1)
BUN/Creatinine Ratio: 13 (calc) (ref 6–22)
BUN: 17 mg/dL (ref 7–25)
CO2: 27 mmol/L (ref 20–32)
Calcium: 9.1 mg/dL (ref 8.6–10.3)
Chloride: 105 mmol/L (ref 98–110)
Creat: 1.3 mg/dL — ABNORMAL HIGH (ref 0.70–1.25)
GFR, Est African American: 67 mL/min/{1.73_m2} (ref >=60)
GFR, Est Non African American: 58 mL/min/{1.73_m2} — ABNORMAL LOW (ref >=60)
Glucose, Bld: 75 mg/dL (ref 65–99)
Phosphorus: 3 mg/dL (ref 2.5–4.5)
Potassium: 5 mmol/L (ref 3.5–5.3)
Sodium: 140 mmol/L (ref 135–146)

## 2019-05-29 LAB — LIPID PANEL W/REFLEX DIRECT LDL
Cholesterol: 239 mg/dL — ABNORMAL HIGH (ref <200)
HDL: 37 mg/dL — ABNORMAL LOW (ref >=40)
LDL Cholesterol (Calc): 156 mg/dL (calc) — ABNORMAL HIGH
Non-HDL Cholesterol (Calc): 202 mg/dL (calc) — ABNORMAL HIGH (ref <130)
Total CHOL/HDL Ratio: 6.5 (calc) — ABNORMAL HIGH (ref <5.0)
Triglycerides: 301 mg/dL — ABNORMAL HIGH (ref <150)

## 2019-05-31 LAB — HEPATITIS C ANTIBODY
Hepatitis C Ab: NONREACTIVE
SIGNAL TO CUT-OFF: 0.05 (ref <1.00)

## 2019-06-23 ENCOUNTER — Other Ambulatory Visit: Payer: Self-pay

## 2019-06-24 ENCOUNTER — Other Ambulatory Visit: Payer: Self-pay | Admitting: *Deleted

## 2019-06-24 MED ORDER — OLMESARTAN MEDOXOMIL 20 MG PO TABS: 20.0000 mg | ORAL_TABLET | Freq: Two times a day (BID) | ORAL | 1 refills | Status: DC

## 2019-07-08 ENCOUNTER — Other Ambulatory Visit: Payer: BLUE CROSS/BLUE SHIELD

## 2019-07-08 ENCOUNTER — Other Ambulatory Visit: Payer: Self-pay

## 2019-07-08 DIAGNOSIS — Z20822 Contact with and (suspected) exposure to covid-19: Secondary | ICD-10-CM

## 2019-07-08 DIAGNOSIS — R6889 Other general symptoms and signs: Secondary | ICD-10-CM | POA: Diagnosis not present

## 2019-07-11 ENCOUNTER — Other Ambulatory Visit: Payer: Self-pay | Admitting: Internal Medicine

## 2019-07-11 DIAGNOSIS — E785 Hyperlipidemia, unspecified: Secondary | ICD-10-CM

## 2019-07-11 LAB — NOVEL CORONAVIRUS, NAA: SARS-CoV-2, NAA: NOT DETECTED

## 2019-08-17 ENCOUNTER — Other Ambulatory Visit: Payer: Self-pay | Admitting: *Deleted

## 2019-08-17 DIAGNOSIS — K5010 Crohn's disease of large intestine without complications: Secondary | ICD-10-CM

## 2019-08-17 NOTE — Telephone Encounter (Signed)
Received medication refill for Budesonide EC 94m capsule 1 cap PO QD

## 2019-08-18 ENCOUNTER — Telehealth: Payer: Self-pay | Admitting: Internal Medicine

## 2019-08-18 MED ORDER — BUDESONIDE 3 MG PO CPEP: 3.0000 mg | ORAL_CAPSULE | ORAL | 1 refills | Status: DC

## 2019-08-18 NOTE — Telephone Encounter (Signed)
Per last OV note with RMR should have decreased Entocort to 3 mg every other day. No other phone notes to indicate a change back to daily. Will send in Rx. Call if any questions

## 2019-08-18 NOTE — Telephone Encounter (Signed)
Left a detailed message. Pt is aware that his medication was sent to his pharmacy.

## 2019-08-18 NOTE — Addendum Note (Signed)
Addended by: Gordy Levan, ERIC A on: 08/18/2019 10:11 AM   Modules accepted: Orders

## 2019-08-18 NOTE — Telephone Encounter (Signed)
PATIENT CALLED REQUESTING A REFILL ON HIS ENTOCORT-HIS PHARMACY WAS SENDING OVER A REFILL REQUEST

## 2019-08-23 ENCOUNTER — Telehealth: Payer: Self-pay | Admitting: Internal Medicine

## 2019-08-23 NOTE — Telephone Encounter (Signed)
[phone]  PATIENT CAME INTO THE OFFICE AND STATED THAT HIS PHARMACY STILL HAS NOT GOTTEN HIS REFILL REQUEST

## 2019-08-23 NOTE — Telephone Encounter (Signed)
Lmom, waiting on a return call. Medication was sent on 08/18/2019. Pt may possible have a different pharmacy request than the pharmacy medication was sent to. Waiting to hear back from pt.

## 2019-08-24 MED ORDER — AMLODIPINE BESYLATE 5 MG PO TABS: 2.5000 mg | ORAL_TABLET | Freq: Two times a day (BID) | ORAL | 6 refills | Status: DC

## 2019-08-25 NOTE — Telephone Encounter (Signed)
Spoke with pt. Pt's pharmacy filled his medication.

## 2019-09-02 ENCOUNTER — Ambulatory Visit: Payer: BC Managed Care – PPO | Admitting: Physician Assistant

## 2019-09-02 ENCOUNTER — Other Ambulatory Visit: Payer: Self-pay

## 2019-09-02 ENCOUNTER — Encounter: Payer: Self-pay | Admitting: Physician Assistant

## 2019-09-02 VITALS — BP 142/84 | HR 82 | Temp 98.2°F | Wt 179.0 lb

## 2019-09-02 DIAGNOSIS — E291 Testicular hypofunction: Secondary | ICD-10-CM

## 2019-09-02 DIAGNOSIS — Z23 Encounter for immunization: Secondary | ICD-10-CM

## 2019-09-02 DIAGNOSIS — E782 Mixed hyperlipidemia: Secondary | ICD-10-CM

## 2019-09-02 DIAGNOSIS — R931 Abnormal findings on diagnostic imaging of heart and coronary circulation: Secondary | ICD-10-CM | POA: Diagnosis not present

## 2019-09-02 DIAGNOSIS — F3340 Major depressive disorder, recurrent, in remission, unspecified: Secondary | ICD-10-CM

## 2019-09-02 DIAGNOSIS — K130 Diseases of lips: Secondary | ICD-10-CM

## 2019-09-02 MED ORDER — MUPIROCIN 2 % EX OINT: TOPICAL_OINTMENT | CUTANEOUS | 0 refills | Status: DC

## 2019-09-02 MED ORDER — PITAVASTATIN CALCIUM 4 MG PO TABS: 1.0000 | ORAL_TABLET | Freq: Every day | ORAL | 1 refills | Status: DC

## 2019-09-02 MED ORDER — HYDROCORTISONE 1 % EX OINT: 1.0000 "application " | TOPICAL_OINTMENT | Freq: Two times a day (BID) | CUTANEOUS | 0 refills | Status: DC

## 2019-09-02 MED ORDER — TESTOSTERONE 50 MG/5GM (1%) TD GEL: 5.0000 g | TRANSDERMAL | 0 refills | Status: DC

## 2019-09-02 NOTE — Patient Instructions (Addendum)
To monitor testosterone levels (due October) - Androgel (topical): fasting 8 am before applying testosterone that day - Testosterone Cypionate (injectable): fasting 8 am, mid-cycle (halfway point between injections)
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
## 2019-09-06 ENCOUNTER — Encounter: Payer: Self-pay | Admitting: Internal Medicine

## 2019-10-20 ENCOUNTER — Ambulatory Visit: Payer: BLUE CROSS/BLUE SHIELD | Admitting: Podiatry

## 2019-11-01 DIAGNOSIS — E291 Testicular hypofunction: Secondary | ICD-10-CM | POA: Diagnosis not present

## 2019-11-02 LAB — CBC
HCT: 46 % (ref 38.5–50.0)
Hemoglobin: 15.5 g/dL (ref 13.2–17.1)
MCH: 30.6 pg (ref 27.0–33.0)
MCHC: 33.7 g/dL (ref 32.0–36.0)
MCV: 90.7 fL (ref 80.0–100.0)
MPV: 10.7 fL (ref 7.5–12.5)
Platelets: 293 10*3/uL (ref 140–400)
RBC: 5.07 10*6/uL (ref 4.20–5.80)
RDW: 13.6 % (ref 11.0–15.0)
WBC: 8.7 10*3/uL (ref 3.8–10.8)

## 2019-11-02 LAB — TESTOSTERONE: Testosterone: 389 ng/dL (ref 250–827)

## 2019-11-02 LAB — PSA: PSA: 2 ng/mL (ref <=4.0)

## 2019-11-09 ENCOUNTER — Other Ambulatory Visit: Payer: Self-pay | Admitting: Osteopathic Medicine

## 2019-11-09 DIAGNOSIS — E291 Testicular hypofunction: Secondary | ICD-10-CM

## 2019-11-09 MED ORDER — TESTOSTERONE 50 MG/5GM (1%) TD GEL: 5.0000 g | TRANSDERMAL | 0 refills | Status: DC

## 2019-11-16 ENCOUNTER — Telehealth: Payer: Self-pay

## 2019-11-16 ENCOUNTER — Encounter: Payer: Self-pay | Admitting: Physician Assistant

## 2019-11-16 ENCOUNTER — Ambulatory Visit (INDEPENDENT_AMBULATORY_CARE_PROVIDER_SITE_OTHER): Payer: BC Managed Care – PPO | Admitting: Internal Medicine

## 2019-11-16 ENCOUNTER — Encounter: Payer: Self-pay | Admitting: Internal Medicine

## 2019-11-16 ENCOUNTER — Other Ambulatory Visit: Payer: Self-pay | Admitting: Physician Assistant

## 2019-11-16 ENCOUNTER — Other Ambulatory Visit: Payer: Self-pay

## 2019-11-16 DIAGNOSIS — K5010 Crohn's disease of large intestine without complications: Secondary | ICD-10-CM | POA: Diagnosis not present

## 2019-11-16 DIAGNOSIS — K219 Gastro-esophageal reflux disease without esophagitis: Secondary | ICD-10-CM | POA: Diagnosis not present

## 2019-11-16 DIAGNOSIS — E782 Mixed hyperlipidemia: Secondary | ICD-10-CM

## 2019-11-16 DIAGNOSIS — R931 Abnormal findings on diagnostic imaging of heart and coronary circulation: Secondary | ICD-10-CM

## 2019-11-16 DIAGNOSIS — F3340 Major depressive disorder, recurrent, in remission, unspecified: Secondary | ICD-10-CM

## 2019-11-16 NOTE — Telephone Encounter (Signed)
Bone density scheduled at [facility] for 02/14/20 at 11:30am. No calcium or multivitamin for 2 days prior to appt.  Called and informed pt of appt. Letter mailed.
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
## 2019-11-16 NOTE — Patient Instructions (Addendum)
Continue Entocort (budesonide) at the low dose of 3 mg daily for now  As discussed, we will schedule a bone density study in [place] in the near future  You need to have a colonoscopy.  If you decide to establish care closer to [facility] just let me know and we will forward records. Either way, would not put off colonoscopy much longer  You may need a different medical regimen for Crohn's.  Updating your colonoscopy will help make informed recommendations regarding treatment.  As discussed, you need to minimize use of nonsteroidal agents like Advil and Aleve as this can worsen your Crohn's disease.  Continue Nexium 40 mg twice daily as this appears to be controlling your GERD.  Have a happy birthday!  Please let me know what you decide about future GI care.

## 2019-11-17 ENCOUNTER — Telehealth: Payer: Self-pay | Admitting: Physician Assistant

## 2019-11-17 MED ORDER — PITAVASTATIN CALCIUM 4 MG PO TABS: 1.0000 | ORAL_TABLET | Freq: Every day | ORAL | 0 refills | Status: DC

## 2019-11-17 NOTE — Telephone Encounter (Signed)
Refilled medication.KG LPN

## 2019-11-17 NOTE — Addendum Note (Signed)
Addended by: Towana Badger on: 11/17/2019 11:51 AM   Modules accepted: Orders

## 2019-11-17 NOTE — Telephone Encounter (Signed)
Jenny Reichmann,   Can you please look out for PA for this pt? He has not been able to start med and has been in limbo since September.   Please keep patient informed with updates.  Thanks!

## 2019-11-22 ENCOUNTER — Ambulatory Visit: Payer: BLUE CROSS/BLUE SHIELD | Admitting: Physician Assistant

## 2019-11-23 ENCOUNTER — Telehealth: Payer: Self-pay | Admitting: Osteopathic Medicine

## 2019-11-23 NOTE — Telephone Encounter (Signed)
Apolonio Schneiders   Both of his PA's were sent today I am waiting to here from the insurance company to see if they are approved or not.   Jenny Reichmann

## 2019-11-23 NOTE — Telephone Encounter (Signed)
Received fax for prior authorization on Livalo and Testosterone sent through cover my meds waiting for determination on Livalo and waiting for clinical questions on Testosterone. - CF

## 2019-11-26 ENCOUNTER — Telehealth: Payer: Self-pay | Admitting: Osteopathic Medicine

## 2019-11-26 NOTE — Telephone Encounter (Signed)
Received fax for PA on Androgel sent through cover my meds and received authorization.   Valid: 11/26/2019 - 12/03-2020   Will notify pharmacy - CF

## 2019-11-26 NOTE — Telephone Encounter (Signed)
Received fax for Livalo and they denied coverage due to they cover when you have a contraindication to all the alternatives or if complete and valid documentation is provided. Placing in providers box. - CF

## 2019-12-02 ENCOUNTER — Other Ambulatory Visit: Payer: Self-pay | Admitting: Internal Medicine

## 2019-12-02 DIAGNOSIS — E785 Hyperlipidemia, unspecified: Secondary | ICD-10-CM

## 2019-12-06 ENCOUNTER — Telehealth: Payer: Self-pay | Admitting: *Deleted

## 2019-12-06 NOTE — Telephone Encounter (Signed)
  Dr, for the 3 months prior to our recent visit my pharmacy had filled my prescription for Budesonide from a manufacturer different than I'm used to. I received a new refill shortly after that visit with the Rx from [facility]. I told you I was having erratic symptoms. Turns out that now that I have the Rx from [facility] I'm back to doing well again! Can you please specify [facility] for my prescription for Budesonide in the future. The brown/white pill is from [employer]

## 2019-12-06 NOTE — Telephone Encounter (Signed)
Spoke with pt. The brand of Entocort that pt received from the pharmacy need to say [facility], the brand that pt received from [facility], pt states that the medication didn't work as well. Whenever pt gets a refill from [facility], he wants our office to make sure it specifies [facility].

## 2019-12-21 ENCOUNTER — Other Ambulatory Visit: Payer: Self-pay

## 2019-12-21 ENCOUNTER — Ambulatory Visit: Payer: BC Managed Care – PPO | Admitting: Internal Medicine

## 2019-12-21 ENCOUNTER — Encounter: Payer: Self-pay | Admitting: Internal Medicine

## 2019-12-21 VITALS — BP 138/88 | HR 88 | Temp 97.3°F | Ht 69.0 in | Wt 184.6 lb

## 2019-12-21 DIAGNOSIS — E785 Hyperlipidemia, unspecified: Secondary | ICD-10-CM | POA: Diagnosis not present

## 2019-12-21 DIAGNOSIS — R931 Abnormal findings on diagnostic imaging of heart and coronary circulation: Secondary | ICD-10-CM

## 2019-12-21 DIAGNOSIS — I4510 Unspecified right bundle-branch block: Secondary | ICD-10-CM | POA: Diagnosis not present

## 2019-12-21 DIAGNOSIS — Z789 Other specified health status: Secondary | ICD-10-CM

## 2019-12-21 NOTE — Patient Instructions (Signed)
Medication Instructions:  NO CHANGES *If you need a refill on your cardiac medications before your next appointment, please call your pharmacy*  Lab Work: Your physician recommends that you return for lab work in 3 months (Lipids)  If you have labs (blood work) drawn today and your tests are completely normal, you will receive your results only by: Marland Kitchen MyChart Message (if you have MyChart) OR . A paper copy in the mail If you have any lab test that is abnormal or we need to change your treatment, we will call you to review the results.  Testing/Procedures: NONE NEEDED  Follow-Up: At [facility], you and your health needs are our priority.  As part of our continuing mission to provide you with exceptional heart care, we have created designated Provider Care Teams.  These Care Teams include your primary Cardiologist (physician) and Advanced Practice Providers (APPs -  Physician Assistants and Nurse Practitioners) who all work together to provide you with the care you need, when you need it.  Your next appointment:   6 month(s)  The format for your next appointment:   In Person  Provider:   K. Mali Hilty, MD

## 2019-12-21 NOTE — Progress Notes (Signed)
OFFICE NOTE  Chief Complaint: Follow-up dyslipidemia  Primary Care Physician: Trixie Dredge, PA-C  HPI:  Tyler Gates this pleasant 63 year old male that I last saw in 2014. He has a history of hypertension and marked dyslipidemia and has been intolerant to statins. He's an avid cyclist but does have a family history of heart disease which is significant and his father had coronary events at an early age. He underwent metabolic testing in December 2013 which showed an RER of 1.08, peak VO2 01 14% predicted which was excellent. At the time he had good control over his blood pressure however recently he's been having problems with low blood pressure. He says in the fall his blood pressure was elevated and he was switched from Exforge 10/320 mg daily to valsartan 320 mg daily. During bike rides an exercise he felt that he would get dizzy at times and thought that his blood pressure possibly was getting low. He did see some systolic blood pressures in the 90s. Subsequently he was changed over to Azor 5/40 mg daily. He says he's uncomfortable with this medicine and has some unusual side effects including dizziness. He unfortunately has not gotten treatment of his dyslipidemia which was remarkable. His total cholesterol was 266, with triglycerides 439, HDL 41 and LDL particle #2758 in 2014. He denies any chest pain or worsening shortness of breath with exertion.  Tyler Gates returns today for follow-up. He reports some continued lability in his blood pressures. He felt that the valsartan was working fairly well but he still has some problems with dizziness and lower blood pressure. He saw Cyril Mourning, our pharmacist who made adjustments in his medications. He has since gone back to [name]. He feels like the worst time is when he takes the medicine and doesn't ride or bicycling within a couple hours of taking the medicine and he feels dizzy and her has low blood pressure. He then has had a few  episodes were his blood pressures been elevated. Once when he was on a trip to [place] and in the car for several hours. He was under a lot of stress and blood pressure got up to the 170s which he had a headache.  I saw Tyler Gates back in the office today. After some adjustments on his medicines last time he feels that he now has good control of his blood pressure. Today the office it was 122/80. He's currently taking a Benicar 40 mg daily and amlodipine 2.5 mg daily. He says that his blood pressures are not as labile as they used to be and he is not having any symptoms such as dizziness or fatigue.  Tyler Gates returns today for follow-up. Overall he is feeling well. He says his blood pressure is running a little bit higher but he's been on of his Benicar for a few days. He denies any chest pain or shortness of breath. He reports he stopped taking Zetia due to cost issues over the past year and has been out of it for several months. He was due for recheck lipid profile today. A routine EKG was performed in the office which shows a new right bundle branch block. This was not present on his prior EKG in May 2016. He tells me however he is asymptomatic and in fact has gone on to very long bike rides over the past several weeks without any change in exercise tolerance.  06/28/2016  Tyler Gates was seen back today in the office for follow-up. He continues to  cycle and exercise regularly and is without cardiac complaints. Blood pressure appears to be fairly well-controlled although he has persistent diastolic hypertension. At his last office visit I started him on ezetimibe due to intolerance of statins and he seems to be taking that without issues. He recently had blood work through his primary care provider which we will request today. He indicated that his cholesterol was down but not optimized.  01/03/2017  Tyler Gates returns today for follow-up. In the interim he is mesh lose about 10 pounds. Unfortunately his blood pressure  still remains a little bit elevated as far as the diastolic blood pressure is concerned. He was previously on Benicar 40 mg daily however that has been reduced to 20 mg. He is also taking amlodipine 5 mg instead of 2.5 mg daily. He continues to cycle and denies any chest pain or worsening shortness of breath. He is unsteady and has had statin intolerance in the past. He's overdue for repeat lipid profile.  10/30/2017  Tyler Gates returns today for follow-up.  I last saw him in January.  He reports he had a good summer and one several mountain bike races.  He unfortunately had a fall over the summer and fractured his femoral neck.  This required screws and a plate to repair.  He is recovered from that and has been concerned recently about his seasonal elevation in blood pressure.  He says in the summer he took several readings showing low blood pressure and managed it with the lower dose amlodipine.  Typically in the winter he increases the dose of his amlodipine for higher blood pressures and notes that he gets headache and symptoms when his blood pressure is elevated.  He told me recently, including even this morning that his blood pressure was quite high in the morning.  He inquired about whether he would benefit from a low-dose diuretic.  05/22/2018  Tyler Gates is seen today in follow-up.  He is overall continuing to do well.  He is exercising regularly and continues to cycle.  Over the summer he has managed with lower dose amlodipine and blood pressure today was well controlled 115/76.  He remains asymptomatic, denies any chest pain or worsening shortness of breath.  Of note, lab work in May 2018 showed total cholesterol 268, HDL 38, LDL 176 and triglycerides 271.  This represents marked dyslipidemia although he has no known coronary disease or cardiovascular disease in the past.  There is family history of heart disease mostly on his mother side.  He reports that he was told he had high cholesterol even in  his 53s and has unfortunately been intolerant of statins.  He is tried both atorvastatin, rosuvastatin, pravastatin and lovastatin.   08/10/2018  Tyler Gates returns today for follow-up.  He underwent a repeat lipid profile recently which showed total cholesterol 258, triglycerides 326, HDL 36 and LDL 157.  In fact he brought in lab work from about 30 years ago which indicated similar numbers.  He underwent CT scan calcium scoring which showed calcifications in the mid RCA and LAD with a calcium score of 410.  This is 85th percentile for age and sex matched control.  Given his high CT coronary calcium score, perfusion imaging was recommended.  In addition was found to have a mildly dilated ascending aorta 3.9 cm.  An incidental finding of a hypoattenuating signal in the liver was noted, most consistent with a cyst per radiology.  Unfortunately he has been intolerant of statins and has  not had significant benefit in reducing his lipid profile on ezetimibe.  09/14/2018  Tyler Gates is seen today in follow-up.  He underwent nuclear stress testing which was low risk for ischemia.  LVEF was slightly reduced however abnormal gating is likely responsible for this.  It appeared visually normal.  He is a coronary calcium score is quite high.  His LDL is well above goal on ezetimibe.  Unfortunately he has been intolerant to statins.  At this point I would recommend pursuing PCSK9 inhibitor.  This could make a significant reduction in cholesterol and his cardiovascular risk.  After much discussion today seemed to be agreeable to consider this therapy.  02/25/2019  Tyler Gates returns today for follow-up of lipid therapy.  I last saw him we discussed starting Repatha.  He received prior authorization approval for this.  His co-pay cost was only $40.  He did receive 1 injector however he had not used it.  Unfortunately he is not started using the injections.  Is not clear if he had prior authorization through the end of this  year or last year.  I will discuss with our pharmacist to look into this further.  He has now moved to [facility] and is using a new pharmacy.  We have updated this information.  12/21/2019  Tyler Gates is seen today for follow-up.  Unfortunately there were some issues getting Repatha.  He did have prior authorization ultimately was told by his insurance he needed to switch to [employer].  A new PA was never obtained.  Recently was placed on Livalo, however did not start taking the medicine until just about a week ago after some difficulty with his pharmacy.  His labs as of June 2020 demonstrated significant dyslipidemia total cholesterol 239, HDL 37, LDL 156 and triglycerides 301.  Based on his high coronary calcium score, he needs aggressive lipid management to target LDL less than 70.  I further reiterated he is at high risk of coronary events.  PMHx:  Past Medical History:  Diagnosis Date  . Crohn's disease ([place])   . Depression   . Dyspnea on exertion   . GERD (gastroesophageal reflux disease)   . Herpes   . Hyperlipidemia   . Hypertension   . Instability of knee joint   . Left displaced femoral neck fracture ([place]) 07/11/2017  . Snoring     Past Surgical History:  Procedure Laterality Date  . COLONOSCOPY  2007   Dr. Sabino Donovan normal TI, ascending colon/cecum with mild inflammation noted but biopses without signs of active inflammation, hyperpastic polyp, possible ischemia on path of left colon biopsy  . COLONOSCOPY N/A 11/08/2015   Procedure: COLONOSCOPY;  Surgeon: [name], MD;  [facility];  Service: Endoscopy;  Laterality: N/A;  . ESOPHAGOGASTRODUODENOSCOPY  2007   Dr. Sabino Donovan reflux esophagitis, hiatal hernia, negative H.pylori  . ESOPHAGOGASTRODUODENOSCOPY N/A 11/08/2015   Procedure: ESOPHAGOGASTRODUODENOSCOPY (EGD);  Surgeon: [name], MD;  [facility];  Service: Endoscopy;  Laterality: N/A;  0830 - moved to 8:15 - office to notify  . HIP PINNING,CANNULATED  Left 07/12/2017   Procedure: CANNULATED HIP PINNING LEFT;  Surgeon: Rod Can, MD;  [facility]: [facility];  Service: Orthopedics;  Laterality: Left;    FAMHx:  Family History  Problem Relation Age of Onset  . Melanoma Father   . Heart disease Father   . Healthy Mother   . Colon cancer Neg Hx     SOCHx:   reports that he has  never smoked. He has never used smokeless tobacco. He reports current alcohol use. He reports that he does not use drugs.  ALLERGIES:  Allergies  Allergen Reactions  . Statins     REACTION: Myalgia    ROS: Pertinent items noted in HPI and remainder of comprehensive ROS otherwise negative.  HOME MEDS: Current Outpatient Medications  Medication Sig Dispense Refill  . amLODipine (NORVASC) 5 MG tablet Take 0.5 tablets (2.5 mg total) by mouth 2 (two) times daily. 30 tablet 6  . budesonide (ENTOCORT EC) 3 MG 24 hr capsule Take 1 capsule (3 mg total) by mouth every other day. (Patient taking differently: Take 3 mg by mouth daily. ) 90 capsule 1  . esomeprazole (NEXIUM) 40 MG capsule TAKE 1 CAPSULE (40MG TOTAL)TWICE A DAY before a meal 180 capsule 3  . ezetimibe (ZETIA) 10 MG tablet TAKE 1 TABLET BY MOUTH EVERY DAY 90 tablet 0  . hydrocortisone 1 % ointment Apply 1 application topically 2 (two) times daily. Apply for 7-14 days. Cycle use 1 week on, 1 week off 25 g 0  . loperamide (IMODIUM) 2 MG capsule Take 2 mg by mouth as needed for diarrhea or loose stools.    . mupirocin ointment (BACTROBAN) 2 % Apply to affected area twice daily for 5 days. Repeat as needed 15 g 0  . olmesartan (BENICAR) 20 MG tablet Take 20 mg by mouth daily. Take 1 tablet by mouth daily    . Pitavastatin Calcium 4 MG TABS Take 1 tablet (4 mg total) by mouth daily. 90 tablet 0  . testosterone (ANDROGEL) 50 MG/5GM (1%) GEL Place 5 g onto the skin every morning. 450 g 0  . venlafaxine (EFFEXOR) 37.5 MG tablet TAKE 1 TABLET (37.5 MG TOTAL) BY MOUTH 2 (TWO) TIMES DAILY. 180 tablet 1   No current  facility-administered medications for this visit.    LABS/IMAGING: No results found for this or any previous visit (from the past 48 hour(s)). No results found.  VITALS: BP 138/88   Pulse 88   Temp (!) 97.3 F (36.3 C)   Ht 5' 9"  (1.753 m)   Wt 184 lb 9.6 oz (83.7 kg)   SpO2 100%   BMI 27.26 kg/m   EXAM: General appearance: alert and no distress Neck: no carotid bruit, no JVD and thyroid not enlarged, symmetric, no tenderness/mass/nodules Lungs: clear to auscultation bilaterally Heart: regular rate and rhythm, S1, S2 normal, no murmur, click, rub or gallop Abdomen: soft, non-tender; bowel sounds normal; no masses,  no organomegaly Extremities: extremities normal, atraumatic, no cyanosis or edema Pulses: 2+ and symmetric Skin: Skin color, texture, turgor normal. No rashes or lesions Neurologic: Grossly normal Psych: Pleasant  EKG: Sinus rhythm with a PAC 888-personally reviewed  ASSESSMENT: 1. High CAC score-410 2. Labile hypertension 3. Dyslipidemia -? familial hyperlipidemia 4. Alternating RBBB and iRBBB, asymptomatic 5. Statin intolerant 6. Low risk myoview stress test (07/2018)  PLAN: 1.   Tyler Gates continues to do well symptomatically.  He is active and continues to ride his bike and distance runs.  He did have a high coronary calcium score and I am concerned about increased risk of heart attack or stroke.  His cholesterol still not at goal with LDL well above 70.  He recently started on Livalo and will need a repeat lipid in 3 months.  Unfortunately this is moderate potency and is not likely to get into his target as he needs about a 65% reduction in his LDL cholesterol.  Ultimately  will need to revisit a PCSK9 inhibitor, likely Praluent based on his insurance requirements.  Follow-up in 6 months.  Pixie Casino, MD, [facility], [place] Director of the Advanced Lipid Disorders &  Cardiovascular Risk Reduction Clinic Diplomate of the  American Board of Clinical Lipidology Attending Cardiologist  Direct Dial: [phone]  Fax: [phone]  Website:  [email or web address] 12/21/2019, 9:05 AM

## 2020-01-03 ENCOUNTER — Other Ambulatory Visit: Payer: Self-pay | Admitting: Gastroenterology

## 2020-02-10 ENCOUNTER — Encounter: Payer: Self-pay | Admitting: Osteopathic Medicine

## 2020-02-10 ENCOUNTER — Encounter: Payer: Self-pay | Admitting: Physician Assistant

## 2020-02-11 ENCOUNTER — Other Ambulatory Visit: Payer: Self-pay

## 2020-02-11 ENCOUNTER — Encounter: Payer: Self-pay | Admitting: Family Medicine

## 2020-02-11 ENCOUNTER — Ambulatory Visit: Payer: 59 | Admitting: Family Medicine

## 2020-02-11 ENCOUNTER — Other Ambulatory Visit: Payer: Self-pay | Admitting: Nurse Practitioner

## 2020-02-11 VITALS — BP 162/100 | HR 80 | Temp 97.8°F | Ht 69.0 in | Wt 185.0 lb

## 2020-02-11 DIAGNOSIS — K5010 Crohn's disease of large intestine without complications: Secondary | ICD-10-CM | POA: Diagnosis not present

## 2020-02-11 DIAGNOSIS — I10 Essential (primary) hypertension: Secondary | ICD-10-CM

## 2020-02-11 DIAGNOSIS — E782 Mixed hyperlipidemia: Secondary | ICD-10-CM

## 2020-02-11 MED ORDER — AMLODIPINE BESYLATE 5 MG PO TABS: 2.5000 mg | ORAL_TABLET | Freq: Two times a day (BID) | ORAL | 6 refills | Status: DC

## 2020-02-11 NOTE — Assessment & Plan Note (Signed)
Lab Results  Component Value Date   LDLCALC 156 (H) 05/28/2019    He will try pitavastatin once current crohn's flare has resolved.  If unable to tolerate this we may need to consider trying repatha for him.

## 2020-02-11 NOTE — Patient Instructions (Signed)
Great to meet you today! Start taking a whole table of amlodipine in the evening.  Follow up for a nurse visit in about 6 weeks.   Once you are riding more frequently we can look at going back to your previous dosing.   I would like to see you back with me in about 4 months or sooner if needed.

## 2020-02-11 NOTE — Progress Notes (Signed)
Tyler Gates - 64 y.o. male MRN [id number]  Date of birth: 1956-11-24  Subjective Chief Complaint  Patient presents with  . Hypertension    HPI Tyler Gates is a 64 y.o. male with history of HTN, HLD, Crohn's and hypogonadism here today due to concerns of elevated BP.    -HTN:  He reports that he has had elevated BP readings at home over the past few weeks.  He is compliant with amlodipine and benicar.  He is typically an avid cycler however during the winter he is not as active and his BP tends to go up.  He is having mild headache associated with his blood pressure.  He denies chest pain, shortness of breath, palpitations.  He is splitting his amlodipine dosing and taking bid due to having fatigue when taking full 81m at one time.    -Crohn's:  Reports having mild flare currently with some diarrhea and cramping.  Has rx for budesonide and mesalamine.  He does not feel that budesonide works as well as it has in the past.  He recently started mesalamine.  [place]   -HLD:  He has had intolerance to several statins as he reports that they cause him to have myalgias and/or diarrhea.  He is now prescribed pitavastatin however he is not taking it due to current Crohn's flare and worry that this may exacerbate symptoms.  Once Crohn's flare resolves he does plan to try.   ROS:  A comprehensive ROS was completed and negative except as noted per HPI   Allergies  Allergen Reactions  . Statins Other (See Comments)    REACTION: Myalgia    Past Medical History:  Diagnosis Date  . Crohn's disease ([place]   . Depression   . Dyspnea on exertion   . GERD (gastroesophageal reflux disease)   . Herpes   . Hyperlipidemia   . Hypertension   . Instability of knee joint   . Left displaced femoral neck fracture ([place] 07/11/2017  . Snoring     Past Surgical History:  Procedure  Laterality Date  . COLONOSCOPY  2007   Dr. RSabino Donovannormal TI, ascending colon/cecum with mild inflammation noted but biopses without signs of active inflammation, hyperpastic polyp, possible ischemia on path of left colon biopsy  . COLONOSCOPY N/A 11/08/2015   Procedure: COLONOSCOPY;  Surgeon: RDaneil Dolin MD;  [facility];  Service: Endoscopy;  Laterality: N/A;  . ESOPHAGOGASTRODUODENOSCOPY  2007   Dr. RSabino Donovanreflux esophagitis, hiatal hernia, negative H.pylori  . ESOPHAGOGASTRODUODENOSCOPY N/A 11/08/2015   Procedure: ESOPHAGOGASTRODUODENOSCOPY (EGD);  Surgeon: RDaneil Dolin MD;  [facility];  Service: Endoscopy;  Laterality: N/A;  0830 - moved to 8:15 - office to notify  . HIP PINNING,CANNULATED Left 07/12/2017   Procedure: CANNULATED HIP PINNING LEFT;  Surgeon: SRod Can MD;  [facility];  Service: Orthopedics;  Laterality: Left;    Social History   Socioeconomic History  . Marital status: Married    Spouse name: Not on file  . Number of children: Not on file  . Years of education: Not on file  . Highest education level: Not on file  Occupational History  . Not on file  Tobacco Use  . Smoking status: Never Smoker  . Smokeless tobacco: Never Used  Substance and Sexual Activity  . Alcohol use: Yes  Alcohol/week: 0.0 standard drinks    Comment: glass a wine once a week  . Drug use: No  . Sexual activity: Not Currently    Birth control/protection: None  Other Topics Concern  . Not on file  Social History Narrative  . Not on file   Social Determinants of Health   Financial Resource Strain:   . Difficulty of Paying Living Expenses: Not on file  Food Insecurity:   . Worried About Charity fundraiser in the Last Year: Not on file  . Ran Out of Food in the Last Year: Not on file  Transportation Needs:   . Lack of Transportation (Medical): Not on file  . Lack of Transportation (Non-Medical): Not on file  Physical Activity:   . Days of  Exercise per Week: Not on file  . Minutes of Exercise per Session: Not on file  Stress:   . Feeling of Stress : Not on file  Social Connections:   . Frequency of Communication with Friends and Family: Not on file  . Frequency of Social Gatherings with Friends and Family: Not on file  . Attends Religious Services: Not on file  . Active Member of Clubs or Organizations: Not on file  . Attends Archivist Meetings: Not on file  . Marital Status: Not on file    Family History  Problem Relation Age of Onset  . Melanoma Father   . Heart disease Father   . Healthy Mother   . Colon cancer Neg Hx     Health Maintenance  Topic Date Due  . COLONOSCOPY  11/07/2025  . TETANUS/TDAP  10/22/2027  . INFLUENZA VACCINE  Completed  . Hepatitis C Screening  Completed  . HIV Screening  Completed     ----------------------------------------------------------------------------------------------------------------------------------------------------------------------------------------------------------------- Physical Exam BP (!) 162/100   Pulse 80   Temp 97.8 F (36.6 C) (Oral)   Ht 5' 9"  (1.753 m)   Wt 185 lb (83.9 kg)   BMI 27.32 kg/m   Physical Exam HENT:     Head: Normocephalic and atraumatic.     Mouth/Throat:     Mouth: Mucous membranes are moist.  Eyes:     General: No scleral icterus. Cardiovascular:     Rate and Rhythm: Normal rate and regular rhythm.  Pulmonary:     Effort: Pulmonary effort is normal.     Breath sounds: Normal breath sounds.  Musculoskeletal:     Cervical back: Neck supple.  Skin:    General: Skin is warm and dry.  Neurological:     General: No focal deficit present.  Psychiatric:        Mood and Affect: Mood normal.        Behavior: Behavior normal.      ------------------------------------------------------------------------------------------------------------------------------------------------------------------------------------------------------------------- Assessment and Plan  Essential hypertension Blood pressure is not goal at for age and co-morbidities.  He is hesitant to increase to 74m due to side effects he had taking a full 534mtab previously.   I recommend increasing his amlodipine to a whole tab in the evening and continue 2.53m72mn the morning.  He will also continue current dose of benicar.  In addition they were instructed to follow a low sodium diet with regular exercise to help to maintain adequate control of blood pressure. I would expect this to improve once his activity increases again.     Crohn's disease of colon without complication (HCCWaynee will continue mesalamine for now.  Updated GI referral entered.   Hyperlipidemia Lab Results  Component Value Date   LDLCALC 156 (H) 05/28/2019    He will try pitavastatin once current crohn's flare has resolved.  If unable to tolerate this we may need to consider trying repatha for him.    Meds ordered this encounter  Medications  . amLODipine (NORVASC) 5 MG tablet    Sig: Take 0.5 tablets (2.5 mg total) by mouth 2 (two) times daily. Take 0.5 tab in the morning and 1 tab in the evening (7.36m total each day.)    Dispense:  45 tablet    Refill:  6    Return in about 4 months (around 06/10/2020) for HTN/HLD.    This visit occurred during the SARS-CoV-2 public health emergency.  Safety protocols were in place, including screening questions prior to the visit, additional usage of staff PPE, and extensive cleaning of exam room while observing appropriate contact time as indicated for disinfecting solutions.

## 2020-02-11 NOTE — Assessment & Plan Note (Signed)
He will continue mesalamine for now.  Updated GI referral entered.

## 2020-02-11 NOTE — Assessment & Plan Note (Addendum)
Blood pressure is not goal at for age and co-morbidities.  He is hesitant to increase to 30m due to side effects he had taking a full 598mtab previously.   I recommend increasing his amlodipine to a whole tab in the evening and continue 2.70m22mn the morning.  He will also continue current dose of benicar.  In addition they were instructed to follow a low sodium diet with regular exercise to help to maintain adequate control of blood pressure. I would expect this to improve once his activity increases again.

## 2020-02-14 ENCOUNTER — Other Ambulatory Visit: Payer: BC Managed Care – PPO

## 2020-02-16 ENCOUNTER — Encounter: Payer: Self-pay | Admitting: Family Medicine

## 2020-02-18 ENCOUNTER — Encounter: Payer: Self-pay | Admitting: General Practice

## 2020-02-18 ENCOUNTER — Telehealth: Payer: Self-pay | Admitting: General Practice

## 2020-02-18 NOTE — Telephone Encounter (Signed)
DISCHARGE LETTER MAILED

## 2020-02-18 NOTE — Telephone Encounter (Signed)
-----   Message from [name], MD sent at 02/17/2020  4:49 PM EST ----- Patient has moved to [facility].  His PCP has referred him to [place].  Lets send him a nice note terminating the  doctor-patient relationship.  He has been a very nice patient.

## 2020-03-01 ENCOUNTER — Ambulatory Visit: Payer: BC Managed Care – PPO | Admitting: Osteopathic Medicine

## 2020-03-01 ENCOUNTER — Other Ambulatory Visit: Payer: Self-pay | Admitting: Internal Medicine

## 2020-03-01 DIAGNOSIS — E785 Hyperlipidemia, unspecified: Secondary | ICD-10-CM

## 2020-03-10 ENCOUNTER — Telehealth: Payer: Self-pay | Admitting: Gastroenterology

## 2020-03-27 ENCOUNTER — Encounter: Payer: Self-pay | Admitting: Gastroenterology

## 2020-04-03 ENCOUNTER — Encounter (INDEPENDENT_AMBULATORY_CARE_PROVIDER_SITE_OTHER): Payer: 59 | Admitting: Ophthalmology

## 2020-04-24 ENCOUNTER — Other Ambulatory Visit: Payer: Self-pay

## 2020-04-24 ENCOUNTER — Encounter: Payer: Self-pay | Admitting: Gastroenterology

## 2020-04-24 ENCOUNTER — Ambulatory Visit: Payer: 59 | Admitting: Gastroenterology

## 2020-04-24 VITALS — BP 120/90 | HR 91 | Temp 97.1°F | Ht 69.0 in | Wt 188.0 lb

## 2020-04-24 DIAGNOSIS — K5010 Crohn's disease of large intestine without complications: Secondary | ICD-10-CM | POA: Diagnosis not present

## 2020-04-24 DIAGNOSIS — K449 Diaphragmatic hernia without obstruction or gangrene: Secondary | ICD-10-CM

## 2020-04-24 DIAGNOSIS — K219 Gastro-esophageal reflux disease without esophagitis: Secondary | ICD-10-CM

## 2020-04-24 MED ORDER — MESALAMINE 1.2 G PO TBEC: DELAYED_RELEASE_TABLET | ORAL | 3 refills | Status: DC

## 2020-04-24 MED ORDER — CLENPIQ 10-3.5-12 MG-GM -GM/160ML PO SOLN: 1.0000 | Freq: Once | ORAL | 0 refills | Status: AC

## 2020-04-24 NOTE — Progress Notes (Signed)
Chief Complaint: Crohn's disease  Referring Provider:     Luetta Nutting, DO   HPI:    Tyler Gates is a 64 y.o. male longstanding history of Crohn's colitis, previously followed  [facility] at [place], referred to the [facility] to establish care.  Additional medical history includes CKD (creat ~1.3), GERD, hypertension, hyperlipidemia  Was last seen by Dr. Gala Romney on 11/16/2019.  At that time was taken budesonide 3 mg/day but with intermittent breakthrough symptoms.  Recommended repeat endoscopic evaluation and likely escalation of therapy, which was deferred next until established a new GI doctor as he has since moved closer to [place].  Had breakthrough symptoms when he tried weaning off budesonide a year ago.  He has since restarted leftover Lialda 4.8 gm/day over the last month with good clinical response. Last took budesonide 10/2019.  Currently with 1 BM/day.  He feels well.  He is an avid Teaching laboratory technician.  Normal CBC in 12/2018.  IBD History:  Diagnosed in his 20's Index symptoms: Abdominal cramping, loose, nonbloody stools. No UGI sxs. No nocturnal stools or tenesmus.    Evaluation to date:  - TPMT: N/A - TB testing: N/A - HBV status: N/A - Pertinent Imaging: None for review - Last colonoscopy: 2016 - Small bowel imaging: None - History of EIMs: None  Current medication: Lialda 4.8 gm/day- restarted 1 month ago. Previous medications: Budesonide, Pentasa and Lialda and Azulfidine.  No previous immunosuppressive therapy  Health Maintenance:  - DEXA: Declined - Vaccinations:      - Annual Flu Vaccine -UTD      - Pneumococcal Vaccine if receiving immunosuppression: N/A      - Zoster vaccine if over age 63: Obtaining record - Micronutrient eval:       - Annual Vit D, B6, iron panel: Order on follow-up      - Ileal disease: B12, fat soluble vitamins: Ordered on follow-up - Surveillance colonoscopy: Ordered today - Surveillance  labs for immunomodulators: N/A - Annual depression screening: None - Annual Dermatology/Skin exam: N/A  Endoscopic Hx: -Colonoscopy (10/2015, Dr. Gala Romney): Mild inflammatory changes, mild to moderate chronic colitis predominantly on the right sided endoscopically, for biopsies throughout colon with Crohn's disease, aside from rectal sparing.  Normal TI -EGD (02/01/2015, Dr. Gala Romney): 1 cm salmon-colored tongue x2 (biopsy: Gastritis), patulous EG junction with 2-3 cm HH, normal stomach and duodenum -Colonoscopy (2007, Dr. Sabino Donovan): Normal TI, ascending colon/cecum mild inflammation but biopsies without active inflammation, hyperplastic polyp, possible ischemia on path left colon biopsy  Separately, history of GERD and hiatal hernia.  Reflux symptoms well controlled on Nexium 40 mg BID.  Was previously treated with omeprazole 40 mg bid.   Past Medical History:  Diagnosis Date  . Crohn's disease ([place])   . Depression   . Dyspnea on exertion   . GERD (gastroesophageal reflux disease)   . Herpes   . Hyperlipidemia   . Hypertension   . Instability of knee joint   . Left displaced femoral neck fracture ([place]) 07/11/2017  . Snoring      Past Surgical History:  Procedure Laterality Date  . COLONOSCOPY  2007   Dr. Sabino Donovan normal TI, ascending colon/cecum with mild inflammation noted but biopses without signs of active inflammation, hyperpastic polyp, possible ischemia on path of left colon biopsy  . COLONOSCOPY N/A 11/08/2015   Procedure: COLONOSCOPY;  Surgeon: [name], MD;  [facility];  Service: Endoscopy;  Laterality: N/A;  . ESOPHAGOGASTRODUODENOSCOPY  2007   Dr. Sabino Donovan reflux esophagitis, hiatal hernia, negative H.pylori  . ESOPHAGOGASTRODUODENOSCOPY N/A 11/08/2015   Procedure: ESOPHAGOGASTRODUODENOSCOPY (EGD);  Surgeon: [name], MD;  [facility];  Service: Endoscopy;  Laterality: N/A;  0830 - moved to 8:15 - office to notify  . HIP PINNING,CANNULATED Left 07/12/2017    Procedure: CANNULATED HIP PINNING LEFT;  Surgeon: Rod Can, MD;  Location: [facility];  Service: Orthopedics;  Laterality: Left;   Family History  Problem Relation Age of Onset  . Melanoma Father   . Heart disease Father   . Healthy Mother   . Colon cancer Neg Hx   . Esophageal cancer Neg Hx    Social History   Tobacco Use  . Smoking status: Never Smoker  . Smokeless tobacco: Never Used  Substance Use Topics  . Alcohol use: Yes    Alcohol/week: 0.0 standard drinks    Comment: glass a wine once a week  . Drug use: No   Current Outpatient Medications  Medication Sig Dispense Refill  . amLODipine (NORVASC) 5 MG tablet Take 0.5 tablets (2.5 mg total) by mouth 2 (two) times daily. Take 0.5 tab in the morning and 1 tab in the evening (7.55m total each day.) 45 tablet 6  . esomeprazole (NEXIUM) 40 MG capsule TAKE 1 CAPSULE (40MG TOTAL)TWICE A DAY BEFORE A MEAL 180 capsule 3  . ezetimibe (ZETIA) 10 MG tablet TAKE 1 TABLET BY MOUTH EVERY DAY 90 tablet 0  . loperamide (IMODIUM) 2 MG capsule Take 2 mg by mouth as needed for diarrhea or loose stools.    . mesalamine (LIALDA) 1.2 g EC tablet Take 4.8 g by mouth daily with breakfast.    . mupirocin ointment (BACTROBAN) 2 % Apply to affected area twice daily for 5 days. Repeat as needed (Patient taking differently: as needed. Apply to affected area twice daily for 5 days. Repeat as needed) 15 g 0  . olmesartan (BENICAR) 20 MG tablet Take 20 mg by mouth daily. Take 1 tablet by mouth daily    . testosterone (ANDROGEL) 50 MG/5GM (1%) GEL Place 5 g onto the skin every morning. 450 g 0  . venlafaxine (EFFEXOR) 37.5 MG tablet TAKE 1 TABLET (37.5 MG TOTAL) BY MOUTH 2 (TWO) TIMES DAILY. 180 tablet 1   No current facility-administered medications for this visit.   Allergies  Allergen Reactions  . Statins Other (See Comments)    REACTION: Myalgia     Review of Systems: All systems reviewed and negative except where noted in HPI.      Physical Exam:    Wt Readings from Last 3 Encounters:  04/24/20 188 lb (85.3 kg)  02/11/20 185 lb (83.9 kg)  12/21/19 184 lb 9.6 oz (83.7 kg)    Temp (!) 97.1 F (36.2 C)   Ht 5' 9"  (1.753 m)   Wt 188 lb (85.3 kg)   BMI 27.76 kg/m  Constitutional:  Pleasant, in no acute distress. Psychiatric: Normal mood and affect. Behavior is normal. EENT: Pupils normal.  Conjunctivae are normal. No scleral icterus. Neck supple. No cervical LAD. Cardiovascular: Normal rate, regular rhythm. No edema Pulmonary/chest: Effort normal and breath sounds normal. No wheezing, rales or rhonchi. Abdominal: Soft, nondistended, nontender. Bowel sounds active throughout. There are no masses palpable. No hepatomegaly. Neurological: Alert and oriented to person place and time. Skin: Skin is warm and dry. No rashes noted.   ASSESSMENT AND PLAN;   1)  Crohn's Disease: 64 year old gentleman with longstanding history (40 years) inflammatory Crohn's Disease.  Has been managed with 5-ASA therapy initially, then with decreasing efficacy, was changed to budesonide, which she has used at low-dose (3 mg) for a long time.  Due to decreasing efficacy, changed back to Lialda in the last month or so, with good control of symptoms.  No previous immunosuppressive therapy.  Best I can tell, Crohn's colitis R>L colon, but per records, may have been some ileal disease in the distant past.  Otherwise, no EIMs or complicating factors.  -Overdue for surveillance colonoscopy -DEXA- declined -Continue Lialda at 4.8 g/day for a total of 8 weeks, then reduce to 2.4 gm/day maintenance dose - BMP now, and again in 6 weeks and 3 months -Discussed risks of 5 ASA therapy, in particular renal disease.  History of CKD (baseline creatinine 1.3).  Will monitor -Did discuss alternate medication strategies, to include Uceris if needed -No plan for escalation to immunosuppressive therapy at this juncture   2) GERD 3) Hiatal  hernia -Controlled with current therapy -Continue antireflux lifestyle/dietary modifications  The indications, risks, and benefits of colonoscopy were explained to the patient in detail. Risks include but are not limited to bleeding, perforation, adverse reaction to medications, and cardiopulmonary compromise. Sequelae include but are not limited to the possibility of surgery, hospitalization, and mortality. The patient verbalized understanding and wished to proceed. All questions answered, referred to the scheduler and bowel prep ordered. Further recommendations pending results of the exam.     Lavena Bullion, DO, FACG  04/24/2020, 9:20 AM   Luetta Nutting, DO

## 2020-04-24 NOTE — Patient Instructions (Signed)
You have been scheduled for a colonoscopy. Please follow written instructions given to you at your visit today.  Please pick up your prep supplies at the pharmacy within the next 1-3 days. If you use inhalers (even only as needed), please bring them with you on the day of your procedure. Your physician has requested that you go to www.startemmi.com and enter the access code given to you at your visit today. This web site gives a general overview about your procedure. However, you should still follow specific instructions given to you by our office regarding your preparation for the procedure.  We have sent the following medications to your pharmacy for you to pick up at your convenience: [facility] provider has requested that you go to the basement level for lab work at [facility] in [place] [address]. Press "B" on the elevator. The lab is located at the first door on the left as you exit the elevator.  Due to recent changes in healthcare laws, you may see the results of your imaging and laboratory studies on MyChart before your provider has had a chance to review them.  We understand that in some cases there may be results that are confusing or concerning to you. Not all laboratory results come back in the same time frame and the provider may be waiting for multiple results in order to interpret others.  Please give [place] 48 hours in order for your provider to thoroughly review all the results before contacting the office for clarification of your results.   It was a pleasure to see you today!  Vito Cirigliano, D.O.

## 2020-04-27 ENCOUNTER — Ambulatory Visit: Payer: 59 | Admitting: Family Medicine

## 2020-04-27 ENCOUNTER — Encounter: Payer: Self-pay | Admitting: Family Medicine

## 2020-04-27 VITALS — BP 132/80 | HR 77 | Ht 68.9 in | Wt 186.9 lb

## 2020-04-27 DIAGNOSIS — E291 Testicular hypofunction: Secondary | ICD-10-CM | POA: Diagnosis not present

## 2020-04-27 DIAGNOSIS — N6452 Nipple discharge: Secondary | ICD-10-CM | POA: Diagnosis not present

## 2020-04-27 MED ORDER — TESTOSTERONE 50 MG/5GM (1%) TD GEL: 5.0000 g | TRANSDERMAL | 0 refills | Status: DC

## 2020-04-27 NOTE — Progress Notes (Signed)
Tyler Gates - 64 y.o. male MRN [id number]  Date of birth: 09/10/1956  Subjective Chief Complaint  Patient presents with  . Breast Problem    HPI Tyler Gates is a 64 y.o. male with history of Crohn's diseaes and hypogonadism here today for complaint of nipple irritation.  Reports that over the past few weeks to months he has noted increased nipple irritation.  Describes as an itching sensation below the skin.  He denies associated pain or tenderness.  Both sides are affected equally.  He has noted some discharge at times from both sides as well  He has been compliant with androgel use.   There is not family history of breast cancer.    ROS:  A comprehensive ROS was completed and negative except as noted per HPI  Allergies  Allergen Reactions  . Statins Other (See Comments)    REACTION: Myalgia    Past Medical History:  Diagnosis Date  . Crohn's disease ([place])   . Depression   . Dyspnea on exertion   . GERD (gastroesophageal reflux disease)   . Herpes   . History of kidney stones   . Hyperlipidemia   . Hypertension   . Instability of knee joint   . Left displaced femoral neck fracture ([place]) 07/11/2017  . Snoring     Past Surgical History:  Procedure Laterality Date  . COLONOSCOPY  2007   Dr. Sabino Donovan normal TI, ascending colon/cecum with mild inflammation noted but biopses without signs of active inflammation, hyperpastic polyp, possible ischemia on path of left colon biopsy  . COLONOSCOPY N/A 11/08/2015   Procedure: COLONOSCOPY;  Surgeon: [name], MD;  [facility];  Service: Endoscopy;  Laterality: N/A;  . ESOPHAGOGASTRODUODENOSCOPY  2007   Dr. Sabino Donovan reflux esophagitis, hiatal hernia, negative H.pylori  . ESOPHAGOGASTRODUODENOSCOPY N/A 11/08/2015   Procedure: ESOPHAGOGASTRODUODENOSCOPY (EGD);  Surgeon: [name], MD;  [facility];  Service: Endoscopy;  Laterality: N/A;  0830 - moved to 8:15 - office to notify  . HIP PINNING,CANNULATED Left  07/12/2017   Procedure: CANNULATED HIP PINNING LEFT;  Surgeon: Rod Can, MD;  Location[facility];  Service: Orthopedics;  Laterality: Left;    Social History   Socioeconomic History  . Marital status: Married    Spouse name: Not on file  . Number of children: 2  . Years of education: Not on file  . Highest education level: Not on file  Occupational History  . Occupation: Retired  Tobacco Use  . Smoking status: Never Smoker  . Smokeless tobacco: Never Used  Substance and Sexual Activity  . Alcohol use: Yes    Alcohol/week: 0.0 standard drinks    Comment: glass a wine once a week  . Drug use: No  . Sexual activity: Not Currently    Birth control/protection: None  Other Topics Concern  . Not on file  Social History Narrative  . Not on file   Social Determinants of Health   Financial Resource Strain:   . Difficulty of Paying Living Expenses:   Food Insecurity:   . Worried About Charity fundraiser in the Last Year:   . Arboriculturist in the Last Year:   Transportation Needs:   . Film/video editor (Medical):   Marland Kitchen Lack of Transportation (Non-Medical):   Physical Activity:   . Days of Exercise per Week:   . Minutes of Exercise per Session:   Stress:   . Feeling of Stress :  Social Connections:   . Frequency of Communication with Friends and Family:   . Frequency of Social Gatherings with Friends and Family:   . Attends Religious Services:   . Active Member of Clubs or Organizations:   . Attends Archivist Meetings:   Marland Kitchen Marital Status:     Family History  Problem Relation Age of Onset  . Melanoma Father   . Heart disease Father   . Healthy Mother   . Colon cancer Neg Hx   . Esophageal cancer Neg Hx     Health Maintenance  Topic Date Due  . INFLUENZA VACCINE  07/23/2020  . COLONOSCOPY  11/07/2025  . TETANUS/TDAP  10/22/2027  . COVID-19 Vaccine  Completed  . Hepatitis C Screening  Completed  . HIV Screening  Completed      ----------------------------------------------------------------------------------------------------------------------------------------------------------------------------------------------------------------- Physical Exam BP 132/80 (BP Location: Left Arm, Patient Position: Sitting, Cuff Size: Normal)   Pulse 77   Ht 5' 8.9" (1.75 m)   Wt 186 lb 14.4 oz (84.8 kg)   SpO2 99%   BMI 27.68 kg/m   Physical Exam Constitutional:      Appearance: Normal appearance.  Eyes:     General: No scleral icterus. Chest:     Chest wall: No mass, swelling or tenderness.     Breasts:        Right: No bleeding, nipple discharge or tenderness.        Left: No bleeding, nipple discharge or tenderness.  Lymphadenopathy:     Upper Body:     Right upper body: No axillary adenopathy.     Left upper body: No axillary adenopathy.  Neurological:     General: No focal deficit present.     Mental Status: He is alert.     ------------------------------------------------------------------------------------------------------------------------------------------------------------------------------------------------------------------- Assessment and Plan  Nipple discharge in male Bilateral nature without mass or bloody discharge is reassuring that this is likely benign.  Check prolactin and estradiol levels.  Update testosterone levels.     Meds ordered this encounter  Medications  . testosterone (ANDROGEL) 50 MG/5GM (1%) GEL    Sig: Place 5 g onto the skin every morning.    Dispense:  450 g    Refill:  0    No follow-ups on file.    This visit occurred during the SARS-CoV-2 public health emergency.  Safety protocols were in place, including screening questions prior to the visit, additional usage of staff PPE, and extensive cleaning of exam room while observing appropriate contact time as indicated for disinfecting solutions.

## 2020-04-27 NOTE — Patient Instructions (Signed)
Great to see you! Have labs completed, we'll be in touch with results.

## 2020-04-27 NOTE — Assessment & Plan Note (Signed)
Bilateral nature without mass or bloody discharge is reassuring that this is likely benign.  Check prolactin and estradiol levels.  Update testosterone levels.

## 2020-04-28 LAB — TESTOSTERONE: Testosterone: 439 ng/dL (ref 250–827)

## 2020-04-28 LAB — ESTRADIOL: Estradiol: 24 pg/mL (ref <=39)

## 2020-04-28 LAB — PROLACTIN: Prolactin: 6.8 ng/mL (ref 2.0–18.0)

## 2020-05-05 ENCOUNTER — Encounter: Payer: Self-pay | Admitting: Family Medicine

## 2020-05-10 ENCOUNTER — Encounter: Payer: Self-pay | Admitting: Gastroenterology

## 2020-05-11 ENCOUNTER — Telehealth: Payer: Self-pay | Admitting: Internal Medicine

## 2020-05-11 NOTE — Telephone Encounter (Signed)
Called patient 05/11/20 to schedule follow up visit, no answer left message

## 2020-05-19 ENCOUNTER — Other Ambulatory Visit: Payer: Self-pay | Admitting: Family Medicine

## 2020-05-19 ENCOUNTER — Encounter: Payer: Self-pay | Admitting: Family Medicine

## 2020-05-19 DIAGNOSIS — K130 Diseases of lips: Secondary | ICD-10-CM

## 2020-05-19 MED ORDER — MUPIROCIN 2 % EX OINT: TOPICAL_OINTMENT | CUTANEOUS | 0 refills | Status: DC

## 2020-05-23 ENCOUNTER — Other Ambulatory Visit: Payer: Self-pay | Admitting: Osteopathic Medicine

## 2020-05-23 ENCOUNTER — Other Ambulatory Visit: Payer: Self-pay

## 2020-05-23 DIAGNOSIS — F3340 Major depressive disorder, recurrent, in remission, unspecified: Secondary | ICD-10-CM

## 2020-05-23 MED ORDER — VENLAFAXINE HCL 37.5 MG PO TABS: 37.5000 mg | ORAL_TABLET | Freq: Two times a day (BID) | ORAL | 1 refills | Status: DC

## 2020-05-24 ENCOUNTER — Ambulatory Visit (AMBULATORY_SURGERY_CENTER): Payer: No Typology Code available for payment source | Admitting: Gastroenterology

## 2020-05-24 ENCOUNTER — Other Ambulatory Visit: Payer: Self-pay

## 2020-05-24 ENCOUNTER — Other Ambulatory Visit (INDEPENDENT_AMBULATORY_CARE_PROVIDER_SITE_OTHER): Payer: No Typology Code available for payment source

## 2020-05-24 ENCOUNTER — Encounter: Payer: Self-pay | Admitting: Gastroenterology

## 2020-05-24 VITALS — BP 129/75 | HR 58 | Temp 96.8°F | Resp 31 | Ht 69.0 in | Wt 188.0 lb

## 2020-05-24 DIAGNOSIS — K5010 Crohn's disease of large intestine without complications: Secondary | ICD-10-CM

## 2020-05-24 DIAGNOSIS — D128 Benign neoplasm of rectum: Secondary | ICD-10-CM

## 2020-05-24 DIAGNOSIS — K641 Second degree hemorrhoids: Secondary | ICD-10-CM | POA: Diagnosis not present

## 2020-05-24 DIAGNOSIS — K621 Rectal polyp: Secondary | ICD-10-CM

## 2020-05-24 DIAGNOSIS — K529 Noninfective gastroenteritis and colitis, unspecified: Secondary | ICD-10-CM

## 2020-05-24 DIAGNOSIS — K5730 Diverticulosis of large intestine without perforation or abscess without bleeding: Secondary | ICD-10-CM

## 2020-05-24 DIAGNOSIS — K449 Diaphragmatic hernia without obstruction or gangrene: Secondary | ICD-10-CM | POA: Diagnosis not present

## 2020-05-24 DIAGNOSIS — K219 Gastro-esophageal reflux disease without esophagitis: Secondary | ICD-10-CM

## 2020-05-24 LAB — BASIC METABOLIC PANEL
BUN: 19 mg/dL (ref 6–23)
CO2: 34 mEq/L — ABNORMAL HIGH (ref 19–32)
Calcium: 8.9 mg/dL (ref 8.4–10.5)
Chloride: 101 mEq/L (ref 96–112)
Creatinine, Ser: 1.28 mg/dL (ref 0.40–1.50)
GFR: 56.49 mL/min — ABNORMAL LOW (ref >60.00)
Glucose, Bld: 99 mg/dL (ref 70–99)
Potassium: 3.9 mEq/L (ref 3.5–5.1)
Sodium: 138 mEq/L (ref 135–145)

## 2020-05-24 LAB — C-REACTIVE PROTEIN: CRP: <1 mg/dL (ref 0.5–20.0)

## 2020-05-24 LAB — SEDIMENTATION RATE: Sed Rate: 3 mm/hr (ref 0–20)

## 2020-05-24 MED ORDER — SODIUM CHLORIDE 0.9 % IV SOLN
500.0000 mL | Freq: Once | INTRAVENOUS | Status: DC
Start: 2020-05-24 — End: 2023-04-23

## 2020-05-24 NOTE — Progress Notes (Signed)
Pt's states no medical or surgical changes since previsit or office visit. 

## 2020-05-24 NOTE — Patient Instructions (Signed)
Handouts given for polyps, diverticulosis and hemorrhoids.  Await pathology results.  YOU HAD AN ENDOSCOPIC PROCEDURE TODAY AT [facility]:   Refer to the procedure report that was given to you for any specific questions about what was found during the examination.  If the procedure report does not answer your questions, please call your gastroenterologist to clarify.  If you requested that your care partner not be given the details of your procedure findings, then the procedure report has been included in a sealed envelope for you to review at your convenience later.  YOU SHOULD EXPECT: Some feelings of bloating in the abdomen. Passage of more gas than usual.  Walking can help get rid of the air that was put into your GI tract during the procedure and reduce the bloating. If you had a lower endoscopy (such as a colonoscopy or flexible sigmoidoscopy) you may notice spotting of blood in your stool or on the toilet paper. If you underwent a bowel prep for your procedure, you may not have a normal bowel movement for a few days.  Please Note:  You might notice some irritation and congestion in your nose or some drainage.  This is from the oxygen used during your procedure.  There is no need for concern and it should clear up in a day or so.  SYMPTOMS TO REPORT IMMEDIATELY:   Following lower endoscopy (colonoscopy or flexible sigmoidoscopy):  Excessive amounts of blood in the stool  Significant tenderness or worsening of abdominal pains  Swelling of the abdomen that is new, acute  Fever of 100F or higher   For urgent or emergent issues, a gastroenterologist can be reached at any hour by calling [phone]. Do not use MyChart messaging for urgent concerns.    DIET:  We do recommend a small meal at first, but then you may proceed to your regular diet.  Drink plenty of fluids but you should avoid alcoholic beverages for 24 hours.  ACTIVITY:  You should plan to take it easy for  the rest of today and you should NOT DRIVE or use heavy machinery until tomorrow (because of the sedation medicines used during the test).    FOLLOW UP: Our staff will call the number listed on your records 48-72 hours following your procedure to check on you and address any questions or concerns that you may have regarding the information given to you following your procedure. If we do not reach you, we will leave a message.  We will attempt to reach you two times.  During this call, we will ask if you have developed any symptoms of COVID 19. If you develop any symptoms (ie: fever, flu-like symptoms, shortness of breath, cough etc.) before then, please call [phone].  If you test positive for Covid 19 in the 2 weeks post procedure, please call and report this information to [place].    If any biopsies were taken you will be contacted by phone or by letter within the next 1-3 weeks.  Please call us at [phone] if you have not heard about the biopsies in 3 weeks.    SIGNATURES/CONFIDENTIALITY: You and/or your care partner have signed paperwork which will be entered into your electronic medical record.  These signatures attest to the fact that that the information above on your After Visit Summary has been reviewed and is understood.  Full responsibility of the confidentiality of this discharge information lies with you and/or your care-partner.

## 2020-05-24 NOTE — Progress Notes (Signed)
Called to room to assist during endoscopic procedure.  Patient ID and intended procedure confirmed with present staff. Received instructions for my participation in the procedure from the performing physician.  

## 2020-05-24 NOTE — Op Note (Signed)
[place] Patient Name: Tyler Gates Procedure Date: 05/24/2020 10:13 AM MRN: [id number] Endoscopist: [name] , MD Age: 64 Referring MD:  Date of Birth: 07/30/1956 Gender: Male Account #: [id number] Procedure:                Colonoscopy Indications:              Disease activity assessment of Crohn's disease of                            the colon, Assess therapeutic response to therapy                            of Crohn's disease of the colon                           64 yo male with a long standing history of                            inflammatory colonic Crohns Disease (40+ year                            history), previously treated with multiple 5-ASA                            agents and budesonide. Currently good clinical                            response to Lialda 4.8 gm, but breakthrough with                            weaning to 2.4 gm. Has since recaptured clinical                            response by increasing back to 3.6 gm/day.                           ?"Colonoscopy (10/2015, Dr. Gala Romney): Mild                            inflammatory changes, mild to moderate chronic                            colitis predominantly on the right sided                            endoscopically, for biopsies throughout colon with                            Crohn's disease, aside from rectal sparing. Normal                            TI                           ?"Colonoscopy (2007, Dr. Sabino Donovan): Normal TI,  ascending                            colon/cecum mild inflammation but biopsies without                            active inflammation, hyperplastic polyp, possible                            ischemia on path left colon biopsy Medicines:                Monitored Anesthesia Care Procedure:                Pre-Anesthesia Assessment:                           - Prior to the procedure, a History and Physical                            was performed, and patient  medications and                            allergies were reviewed. The patient's tolerance of                            previous anesthesia was also reviewed. The risks                            and benefits of the procedure and the sedation                            options and risks were discussed with the patient.                            All questions were answered, and informed consent                            was obtained. Prior Anticoagulants: The patient has                            taken no previous anticoagulant or antiplatelet                            agents. ASA Grade Assessment: II - A patient with                            mild systemic disease. After reviewing the risks                            and benefits, the patient was deemed in                            satisfactory condition to undergo the procedure.  After obtaining informed consent, the colonoscope                            was passed under direct vision. Throughout the                            procedure, the patient's blood pressure, pulse, and                            oxygen saturations were monitored continuously. The                            Colonoscope was introduced through the anus and                            advanced to the 10 cm into the ileum. The                            colonoscopy was performed without difficulty. The                            patient tolerated the procedure well. The quality                            of the bowel preparation was good. The terminal                            ileum, ileocecal valve, appendiceal orifice, and                            rectum were photographed. Scope In: 10:27:48 AM Scope Out: 10:53:56 AM Scope Withdrawal Time: 0 hours 22 minutes 12 seconds  Total Procedure Duration: 0 hours 26 minutes 8 seconds  Findings:                 Hemorrhoids were found on perianal exam.                           A few sessile  polyps were found in the rectum. The                            polyps were 1 to 2 mm in size. Several of these                            polyps were removed with a cold biopsy forceps for                            histologic representative evaluation. Resection and                            retrieval were complete. Estimated blood loss was                            minimal.  Inflammation characterized by congestion (edema),                            erythema, loss of vascularity and aphthous                            ulcerations was found in a continuous and                            circumferential pattern from the transverse colon                            to the cecum. This is graded as moderate in                            severity. There were areas of less pronounced                            inflammation (erythema, edema, loss of vascularity)                            surrounded by patches of normal mucosa in the                            sigmoid colon and in the descending colon. The                            rectum was spared. Several mucosal biopsies were                            taken throughout the colon with a cold forceps for                            histology. Estimated blood loss was minimal.                           A few small-mouthed diverticula were found in the                            sigmoid colon.                           The terminal ileum appeared normal.                           Non-bleeding internal hemorrhoids were found during                            retroflexion. The hemorrhoids were small and Grade                            II (internal hemorrhoids that prolapse but reduce  spontaneously). Complications:            No immediate complications. Estimated Blood Loss:     Estimated blood loss was minimal. Impression:               - Hemorrhoids found on perianal exam.                            - A few 1 to 2 mm polyps in the rectum, removed                            with a cold biopsy forceps. Resected and retrieved.                           - Crohn's disease with colonic involvement.                            Inflammation was found from the transverse colon to                            the cecum, in the sigmoid colon and in the                            descending colon. This was moderate in severity.                            Biopsied.                           - Diverticulosis in the sigmoid colon.                           - The examined portion of the ileum was normal.                           - Non-bleeding internal hemorrhoids. Recommendation:           - Patient has a contact number available for                            emergencies. The signs and symptoms of potential                            delayed complications were discussed with the                            patient. Return to normal activities tomorrow.                            Written discharge instructions were provided to the                            patient.                           - Resume previous diet.                           -  Continue present medications.                           - Await pathology results.                           - Check fecal calprotectin, ESR, CRP now to                            establish baseline.                           - Return to GI clinic in 4 weeks. Given otherwise                            clinical response to therapy, no changes in                            medications made today. However, given degree of                            inflammation, will need to discuss ongoing medical                            management options, to include continued 5-ASA,                            adding topical steroid such as Uceris, or                            escalating therapy based on symptomatology and                            lab/biopsy  results. [name], MD 05/24/2020 11:10:22 AM

## 2020-05-24 NOTE — Progress Notes (Signed)
pt tolerated well. VSS. awake and to recovery. Report given to RN.  

## 2020-05-26 ENCOUNTER — Telehealth: Payer: Self-pay | Admitting: *Deleted

## 2020-05-26 ENCOUNTER — Telehealth: Payer: Self-pay

## 2020-05-26 NOTE — Telephone Encounter (Signed)
First attempt, left VM.

## 2020-05-26 NOTE — Telephone Encounter (Signed)
2nd follow up call made.  NALM 

## 2020-05-27 ENCOUNTER — Other Ambulatory Visit: Payer: Self-pay | Admitting: Internal Medicine

## 2020-05-27 DIAGNOSIS — E785 Hyperlipidemia, unspecified: Secondary | ICD-10-CM

## 2020-06-02 ENCOUNTER — Telehealth: Payer: Self-pay | Admitting: Internal Medicine

## 2020-06-02 NOTE — Telephone Encounter (Signed)
I attempted to contact patient on 06/02/20 to schedule 6 mos follow appointment with Dr. Debara Pickett. Patient declined to make appointment and stated he would talk with his PCP to see if he needs to continue to see any cardiologist.

## 2020-06-14 ENCOUNTER — Ambulatory Visit: Payer: 59 | Admitting: Family Medicine

## 2020-07-11 ENCOUNTER — Ambulatory Visit: Payer: No Typology Code available for payment source | Admitting: Gastroenterology

## 2020-07-17 ENCOUNTER — Other Ambulatory Visit: Payer: Self-pay | Admitting: Internal Medicine

## 2020-07-21 ENCOUNTER — Other Ambulatory Visit: Payer: Self-pay | Admitting: Gastroenterology

## 2020-08-10 ENCOUNTER — Encounter: Payer: Self-pay | Admitting: Gastroenterology

## 2020-08-10 ENCOUNTER — Ambulatory Visit: Payer: No Typology Code available for payment source | Admitting: Gastroenterology

## 2020-08-10 VITALS — BP 120/70 | HR 91 | Ht 69.0 in | Wt 192.1 lb

## 2020-08-10 DIAGNOSIS — K50119 Crohn's disease of large intestine with unspecified complications: Secondary | ICD-10-CM

## 2020-08-10 DIAGNOSIS — R7989 Other specified abnormal findings of blood chemistry: Secondary | ICD-10-CM

## 2020-08-10 NOTE — Patient Instructions (Addendum)
If you are age 65 or older, your body mass index should be between 23-30. Your Body mass index is 28.37 kg/m. If this is out of the aforementioned range listed, please consider follow up with your Primary Care Provider.  If you are age 62 or younger, your body mass index should be between 19-25. Your Body mass index is 28.37 kg/m. If this is out of the aformentioned range listed, please consider follow up with your Primary Care Provider.   Your provider has requested that you go to the basement level for lab work at [facility]. [place], [place]. Press "B" on the elevator. The lab is located at the first door on the left as you exit the elevator. Sept. 15, 2021  Follow up with me in six months.  Please call the office as the schedule is not available at this time.   It was a pleasure to see you today!  Vito Cirigliano, D.O.

## 2020-08-10 NOTE — Progress Notes (Signed)
P  Chief Complaint:    Crohn's disease  GI History: 64 year old male with a history of CKD (creatinine ~1.3), GERD, HTN, HLD, follows in the GI clinic for Crohn's Colitis and GERD.  1) Crohn's Disease: longstanding history of Crohn's colitis, previously followed  [facility] at [facility].  Initially seen by me on 04/23/2028.  Prior to that, was treated with budesonide in 10/2019.  Restarted Lialda 4.8 g/day in 03/2020 for breakthrough symptoms with good clinical response.  Had breakthrough when weaning to 2.4 g/day.  Responded with slight increase to 3.6 g/day.  He is an avid Teaching laboratory technician.  Normal CBC in 12/2018.  IBD History:  Diagnosed in his 20's Index symptoms: Abdominal cramping, loose, nonbloody stools. No UGI sxs. No nocturnal stools or tenesmus.    Evaluation to date:  - TPMT: N/A - TB testing: N/A - HBV status: N/A - Pertinent Imaging: None for review - Last colonoscopy:  05/2020 - Small bowel imaging: None - History of EIMs: None  Current medication: Lialda 3.6 gm/day Previous medications: Budesonide, Pentasa, Lialda, Azulfidine.  No previous immunosuppressive therapy  Health Maintenance:  - DEXA: Declined - Vaccinations:      - Annual Flu Vaccine -UTD      - Pneumococcal Vaccine if receiving immunosuppression: N/A      - Zoster vaccine if over age 21: Obtaining record - Micronutrient eval:       - Annual Vit D, B6, iron panel: Order on follow-up      - Ileal disease: B12, fat soluble vitamins: Ordered on follow-up - Surveillance colonoscopy: Ordered today - Surveillance labs for immunomodulators: N/A - Annual depression screening: None - Annual Dermatology/Skin exam: N/A  Endoscopic Hx: -Colonoscopy (05/2020, Dr. Bryan Lemma): Moderate, active colitis (erythema, edema, aphthous ulcers) from transverse colon to cecum.  Mild inflammation in the left colon in a patchy distribution with rectal sparing.  Sigmoid diverticulosis.  Internal hemorrhoids.  Normal TI.   Benign rectal hyperplastic polyps -Colonoscopy (10/2015, Dr. Gala Romney): Mild inflammatory changes, mild to moderate chronic colitis predominantly on the right sided endoscopically, for biopsies throughout colon with Crohn's disease, aside from rectal sparing.  Normal TI -EGD (02/01/2015, Dr. Gala Romney): 1 cm salmon-colored tongue x2 (biopsy: Gastritis), patulous EG junction with 2-3 cm HH, normal stomach and duodenum -Colonoscopy (2007, Dr. Sabino Donovan): Normal TI, ascending colon/cecum mild inflammation but biopsies without active inflammation, hyperplastic polyp, possible ischemia on path left colon biopsy  2) GERD: History of GERD and hiatal hernia.  Reflux symptoms well controlled on Nexium 40 mg BID.  Was previously treated with omeprazole 40 mg bid.  HPI:     Patient is a 64 y.o. male presenting to the Gastroenterology Clinic for follow-up. Initially seen by me in 04/2020.  Subsequent colonoscopy in 05/2020 with moderately severe, active colitis from the transverse colon through the cecum, with mild left-sided colitis in a patchy distribution.  Normal TI.  ESR/CRP were normal.  Fecal calprotectin ordered but not completed.  Had breakthrough when weaning Lialda to 2.4 g/day, so he increased back to 3.6 g/day with good clinical response.  Otherwise, today, he states he feels well. No issue tolerating medication.  Regular formed stool, no hematochezia.   Review of systems:     No chest pain, no SOB, no fevers, no urinary sx   Past Medical History:  Diagnosis Date  . Crohn's disease ([place])   . Depression   . Dyspnea on exertion   . GERD (gastroesophageal reflux disease)   . Herpes   .  History of kidney stones   . Hyperlipidemia   . Hypertension   . Instability of knee joint   . Left displaced femoral neck fracture ([place]) 07/11/2017  . Snoring     Patient's surgical history, family medical history, social history, medications and allergies were all reviewed in Epic    Current Outpatient Medications   Medication Sig Dispense Refill  . amLODipine (NORVASC) 5 MG tablet Take 0.5 tablets (2.5 mg total) by mouth 2 (two) times daily. Take 0.5 tab in the morning and 1 tab in the evening (7.22m total each day.) 45 tablet 6  . esomeprazole (NEXIUM) 40 MG capsule TAKE 1 CAPSULE (40MG TOTAL)TWICE A DAY BEFORE A MEAL 180 capsule 3  . ezetimibe (ZETIA) 10 MG tablet TAKE 1 TABLET BY MOUTH EVERY DAY 90 tablet 1  . loperamide (IMODIUM) 2 MG capsule Take 2 mg by mouth as needed for diarrhea or loose stools.    . mesalamine (LIALDA) 1.2 g EC tablet TAKE TWO TABLETS DAILY 180 tablet 1  . mupirocin ointment (BACTROBAN) 2 % Apply to affected area twice daily for 7 days. Repeat as needed 15 g 0  . olmesartan (BENICAR) 20 MG tablet TAKE 1 TABLET BY MOUTH TWICE A DAY- INS PAYS FOR 1 PER DAY (Patient taking differently: Take 20 mg by mouth daily. ) 180 tablet 1  . testosterone (ANDROGEL) 50 MG/5GM (1%) GEL Place 5 g onto the skin every morning. 450 g 0  . venlafaxine (EFFEXOR) 37.5 MG tablet TAKE 1 TABLET (37.5 MG TOTAL) BY MOUTH 2 (TWO) TIMES DAILY. 180 tablet 1   Current Facility-Administered Medications  Medication Dose Route Frequency Provider Last Rate Last Admin  . 0.9 %  sodium chloride infusion  500 mL Intravenous Once Carsynn Bethune V, DO        Physical Exam:     BP 120/70   Pulse 91   Ht 5' 9"  (1.753 m)   Wt 192 lb 2 oz (87.1 kg)   BMI 28.37 kg/m   GENERAL:  Pleasant male in NAD PSYCH: : Cooperative, normal affect Musculoskeletal:  Normal muscle tone, normal strength NEURO: Alert and oriented x 3, no focal neurologic deficits   IMPRESSION and PLAN:    1) Crohn's Disease 64year old male with longstanding history of Crohn's colitis.  Currently in clinical remission with Lialda 3.6 g/day.  Recent colonoscopy with moderately severe right-sided disease and mild left-sided disease, with active, chronic colitis on biopsies.  Discussed the difference between clinical remission vs endoscopic and  deep remission.  It is possible that the disease activity noted at time of colonoscopy was on the downtrend/improving with Lialda, and his clinical remission is truly indicative of response to therapy.  Further complicating matters were normal CRP/ESR, making these less reliable markers.  Unfortunately, he did not go for fecal calprotectin to establish baseline.  Discussed ongoing diagnostic and treatment options, to include escalation of therapy, trial of Uceris, etc.  As these would be more so treating his endoscopic/histologic disease, would be difficult to establish good endpoint/response to therapy.  Similarly, he does not want to escalate therapy unless this is very clearly indicated.  Seen as he otherwise feels well, I think it is reasonable to stay with the current course and plan as below:  -Resume Lialda at current dose -Check BMP in another 4 weeks -Discussed consideration for repeat colonoscopy in 6-12 months to assess response to therapy hopefully establish deep remission.  However, this could be cost prohibitive -Alternatively, could check fecal  calprotectin in another 6 months or so as a surrogate marker of inflammation -Briefly discussed escalation of therapy with Biologics, immunomodulators, etc.  Based on current clinical remission, he does not favor this approach, which is reasonable -Discussed trial of Uceris, but similar to the above, holding off on this option  2) CKD -Baseline creatinine about 1.3 and recent repeat after starting the Lialda was again 1.3 -Repeat BMP in another 4 weeks.  If creatinine stable, can continue on the therapy.  If increasing, may require Nephrology referral and/or change in therapy  RTC in 6 months or sooner as needed  I spent 32 minutes of time, including in depth chart review, independent review of results as outlined above, communicating results with the patient directly, face-to-face time with the patient, coordinating care, and ordering studies  and medications as appropriate, and documentation.            [place] ,DO, FACG 08/10/2020, 3:10 PM

## 2020-09-14 ENCOUNTER — Other Ambulatory Visit: Payer: Self-pay | Admitting: Family Medicine

## 2020-09-27 ENCOUNTER — Other Ambulatory Visit: Payer: Self-pay | Admitting: Gastroenterology

## 2020-09-27 MED ORDER — MESALAMINE 1.2 G PO TBEC: 3.6000 g | DELAYED_RELEASE_TABLET | Freq: Every day | ORAL | 5 refills | Status: DC

## 2020-10-24 ENCOUNTER — Other Ambulatory Visit: Payer: Self-pay | Admitting: Family Medicine

## 2020-10-24 ENCOUNTER — Encounter: Payer: Self-pay | Admitting: Family Medicine

## 2020-10-24 DIAGNOSIS — E291 Testicular hypofunction: Secondary | ICD-10-CM

## 2020-11-10 ENCOUNTER — Other Ambulatory Visit: Payer: Self-pay | Admitting: Family Medicine

## 2020-11-10 DIAGNOSIS — F3340 Major depressive disorder, recurrent, in remission, unspecified: Secondary | ICD-10-CM

## 2020-11-23 ENCOUNTER — Other Ambulatory Visit: Payer: Self-pay | Admitting: Internal Medicine

## 2020-11-23 DIAGNOSIS — E785 Hyperlipidemia, unspecified: Secondary | ICD-10-CM

## 2020-12-14 ENCOUNTER — Telehealth: Payer: Self-pay | Admitting: Neurology

## 2020-12-14 NOTE — Telephone Encounter (Signed)
Prior Authorization for Livalo submitted via covermymeds. Awaiting response.

## 2020-12-24 ENCOUNTER — Other Ambulatory Visit: Payer: Self-pay | Admitting: Gastroenterology

## 2020-12-27 ENCOUNTER — Encounter: Payer: Self-pay | Admitting: Family Medicine

## 2020-12-29 ENCOUNTER — Other Ambulatory Visit: Payer: Self-pay | Admitting: Gastroenterology

## 2021-03-05 ENCOUNTER — Encounter: Payer: Self-pay | Admitting: Family Medicine

## 2021-03-06 ENCOUNTER — Encounter: Payer: Self-pay | Admitting: General Surgery

## 2021-03-06 ENCOUNTER — Telehealth: Payer: Self-pay | Admitting: Gastroenterology

## 2021-03-06 NOTE — Telephone Encounter (Signed)
Pls call pt, he stated to have some information about mesalamine and how to submit it to his insurance again.

## 2021-03-06 NOTE — Telephone Encounter (Signed)
Spoke with the patient and he stated he had an allergic reaction to sulfasalizine many years ago, he states we need to indicate this to [facility] so they will approve his mesalamine. I expressed to him that we did not have that medication as an allergy listed. He gave me verbal approval to place that information in his chart and that the medication sulfasalizine made him flu like. Patient states he believes it was the sulfa in the medication.

## 2021-03-08 ENCOUNTER — Telehealth: Payer: Self-pay | Admitting: General Surgery

## 2021-03-08 MED ORDER — MESALAMINE ER 0.375 G PO CP24: ORAL_CAPSULE | ORAL | 5 refills | Status: DC

## 2021-03-08 NOTE — Telephone Encounter (Signed)
Dr Bryan Lemma will change the patients rx to Melasamine ER 0.375 capsules 4 daily. Sent new rx with prior auth form to [facility].

## 2021-03-08 NOTE — Telephone Encounter (Signed)
Notified the patient his rx was approved and he was very happy.

## 2021-03-08 NOTE — Telephone Encounter (Signed)
[employer] sent fax stating Mesalamine ER is covered.

## 2021-03-22 ENCOUNTER — Other Ambulatory Visit: Payer: Self-pay | Admitting: Family Medicine

## 2021-03-22 DIAGNOSIS — Z5181 Encounter for therapeutic drug level monitoring: Secondary | ICD-10-CM

## 2021-03-22 DIAGNOSIS — E291 Testicular hypofunction: Secondary | ICD-10-CM

## 2021-03-22 MED ORDER — TESTOSTERONE 20.25 MG/ACT (1.62%) TD GEL: 40.5000 mg | Freq: Every day | TRANSDERMAL | 4 refills | Status: DC

## 2021-03-22 NOTE — Telephone Encounter (Signed)
New dose of testosterone sent.  Recheck levels in 10 weeks.   Thanks!  CM

## 2021-04-10 ENCOUNTER — Ambulatory Visit (INDEPENDENT_AMBULATORY_CARE_PROVIDER_SITE_OTHER): Payer: Medicare HMO | Admitting: Family Medicine

## 2021-04-10 ENCOUNTER — Other Ambulatory Visit: Payer: Self-pay

## 2021-04-10 ENCOUNTER — Encounter: Payer: Self-pay | Admitting: Family Medicine

## 2021-04-10 DIAGNOSIS — K219 Gastro-esophageal reflux disease without esophagitis: Secondary | ICD-10-CM | POA: Diagnosis not present

## 2021-04-10 DIAGNOSIS — E291 Testicular hypofunction: Secondary | ICD-10-CM

## 2021-04-10 DIAGNOSIS — K5010 Crohn's disease of large intestine without complications: Secondary | ICD-10-CM | POA: Diagnosis not present

## 2021-04-10 MED ORDER — OMEPRAZOLE 40 MG PO CPDR: 40.0000 mg | DELAYED_RELEASE_CAPSULE | Freq: Two times a day (BID) | ORAL | 3 refills | Status: DC

## 2021-04-10 NOTE — Assessment & Plan Note (Signed)
Change nexium to omeprazole.

## 2021-04-10 NOTE — Assessment & Plan Note (Signed)
Not well controlled with mesalamine.  He will contact his GI physician regarding budesonide.

## 2021-04-10 NOTE — Progress Notes (Signed)
Tyler Gates - 65 y.o. male MRN [id number]  Date of birth: 04-27-1956  Subjective Chief Complaint  Patient presents with  . Follow-up    HPI Tyler Gates is a 65 y.o. male here today for follow up of low testosterone.  We have had some issues getting his previous testosterone formulation approved.  We did change to a different formulation that appeared to be preferred but this was not approved either.  He has been off of this for a little over 1 week states that he hasn't really noticed a difference. He thinks that he would prefer just to stay off of this for now as he isn't as active as he was when he first was diagnosed with low testosterone.    He also has had increased symptoms related to his Crohn's recently.  Mesalamine has not helped at all.  He did have some budesonide that he started and symptoms resolved within a few days.  He would like to restart this and will contact his GI physician regarding this.  He does seem to have some extraintestinal manifestations as well because he reports improved joint pain when his Crohn's is better controlled.   He needs to have nexium change to omeprazole due to insurance formulary change.   Allergies  Allergen Reactions  . Sulfasalazine Other (See Comments)    Flu like feelings  . Statins Other (See Comments)    REACTION: Myalgia    Past Medical History:  Diagnosis Date  . Crohn's disease ([place])   . Depression   . Dyspnea on exertion   . GERD (gastroesophageal reflux disease)   . Herpes   . History of kidney stones   . Hyperlipidemia   . Hypertension   . Instability of knee joint   . Left displaced femoral neck fracture ([address]) 07/11/2017  . Snoring     Past Surgical History:  Procedure Laterality Date  . COLONOSCOPY  2007   Dr. Sab[facility] Donovan normal TI, ascending colon/cecum with mild inflammation noted but biopses without signs of active inflammation, hyperpastic polyp, possible ischemia on path of left colon biopsy  . COLONOSCOPY N/A  11/08/2015   Procedure: COLONOSCOPY;  Surgeon: [name], MD;  Location: [facility];  Service: Endoscopy;  Laterality: N/A;  . ESOPHAGOGASTRODUODENOSCOPY  2007   Dr. Sabino Donovan reflux esophagitis, hiatal hernia, negative H.pylori  . ESOPHAGOGASTRODUODENOSCOPY N/A 11/08/2015   Procedure: ESOPHAGOGASTRODUODENOSCOPY (EGD);  Surgeon: [name], MD;  Location: [facility];  Service: Endoscopy;  Laterality: N/A;  0830 - moved to 8:15 - office to notify  . HIP PINNING,CANNULATED Left 07/12/2017   Procedure: CANNULATED HIP PINNING LEFT;  Surgeon: Rod Can, MD;  Location: [facility];  Service: Orthopedics;  Laterality: Left;    Social History   Socioeconomic History  . Marital status: Married    Spouse name: Not on file  . Number of children: 2  . Years of education: Not on file  . Highest education level: Not on file  Occupational History  . Occupation: Retired  Tobacco Use  . Smoking status: Never Smoker  . Smokeless tobacco: Never Used  Vaping Use  . Vaping Use: Never used  Substance and Sexual Activity  . Alcohol use: Yes    Alcohol/week: 0.0 standard drinks    Comment: glass a wine once a week  . Drug use: No  . Sexual activity: Not Currently    Birth control/protection: None  Other Topics Concern  . Not on file  Social History Narrative  .  Not on file   Social Determinants of Health   Financial Resource Strain: Not on file  Food Insecurity: Not on file  Transportation Needs: Not on file  Physical Activity: Not on file  Stress: Not on file  Social Connections: Not on file    Family History  Problem Relation Age of Onset  . Melanoma Father   . Heart disease Father   . Healthy Mother   . Colon cancer Neg Hx   . Esophageal cancer Neg Hx     Health Maintenance  Topic Date Due  . COVID-19 Vaccine (3 - Booster for Pfizer series) 10/25/2020  . PNA vac Low Risk Adult (1 of 2 - PCV13) Never done  . INFLUENZA VACCINE  07/23/2021  . TETANUS/TDAP  10/22/2027  .  COLONOSCOPY (Pts 45-17yr Insurance coverage will need to be confirmed)  05/24/2030  . Hepatitis C Screening  Completed  . HIV Screening  Completed  . HPV VACCINES  Aged Out     ----------------------------------------------------------------------------------------------------------------------------------------------------------------------------------------------------------------- Physical Exam BP (!) 143/80 (BP Location: Left Arm, Patient Position: Sitting, Cuff Size: Normal)   Pulse 88   Wt 187 lb 11.2 oz (85.1 kg)   SpO2 99%   BMI 27.72 kg/m   Physical Exam Constitutional:      Appearance: Normal appearance.  Eyes:     General: No scleral icterus. Cardiovascular:     Rate and Rhythm: Normal rate and regular rhythm.  Pulmonary:     Effort: Pulmonary effort is normal.     Breath sounds: Normal breath sounds.  Musculoskeletal:     Cervical back: Neck supple.  Neurological:     General: No focal deficit present.     Mental Status: He is alert.     ------------------------------------------------------------------------------------------------------------------------------------------------------------------------------------------------------------------- Assessment and Plan  Hypogonadism male He prefers to remain off of testosterone for now.  He will let me know if he develops any significant symptoms after remaining off of this.   Crohn's disease of colon without complication ([place] Not well controlled with mesalamine.  He will contact his GI physician regarding budesonide.    GERD Change nexium to omeprazole.    Meds ordered this encounter  Medications  . omeprazole (PRILOSEC) 40 MG capsule    Sig: Take 1 capsule (40 mg total) by mouth in the morning and at bedtime.    Dispense:  180 capsule    Refill:  3    Return in about 6 months (around 10/10/2021) for Annual exam.    This visit occurred during the SARS-CoV-2 public health emergency.  Safety  protocols were in place, including screening questions prior to the visit, additional usage of staff PPE, and extensive cleaning of exam room while observing appropriate contact time as indicated for disinfecting solutions.

## 2021-04-10 NOTE — Patient Instructions (Addendum)
Great to see you! Keep me updated on how things are without testosterone.  Follow up with Dr. Bryan Lemma regarding budesonide.  See me for annual exam in about 6 months.

## 2021-04-10 NOTE — Assessment & Plan Note (Signed)
He prefers to remain off of testosterone for now.  He will let me know if he develops any significant symptoms after remaining off of this.

## 2021-04-12 ENCOUNTER — Telehealth: Payer: Self-pay | Admitting: General Surgery

## 2021-04-12 DIAGNOSIS — K5010 Crohn's disease of large intestine without complications: Secondary | ICD-10-CM

## 2021-04-12 MED ORDER — BUDESONIDE 3 MG PO CPEP: ORAL_CAPSULE | ORAL | 0 refills | Status: DC

## 2021-04-12 NOTE — Telephone Encounter (Signed)
Left message for patient to call me. Also sent a mychart message

## 2021-04-12 NOTE — Telephone Encounter (Signed)
error 

## 2021-04-12 NOTE — Telephone Encounter (Signed)
The patient contacted the office and stated he would prefer to stay on budesonide all other medication does not seem to help. He found a pharmacy that will only charge him 55/monthly  private pay for 9 mg daily. Budesonide is not covered on his regular insurance plan, we have tried PA with no luck.    Pharmacy information:  [facility] [address] [place] [address] phone number is [phone].  The patient was instructed on labs and stool studies. He would like to have it done at Dr Zigmund Daniel office. Orders placed patient will go next week when he is back intown.

## 2021-04-16 NOTE — Telephone Encounter (Signed)
Patient called wanting to know where his orders are to pick up for he went to [facility] and they told him they do not have any orders. Patient is still there waiting is asking for a call to advise as soon as possible.

## 2021-04-16 NOTE — Telephone Encounter (Signed)
The patient will come to [facility] and have labs drawn 04/17/2021

## 2021-04-18 DIAGNOSIS — R69 Illness, unspecified: Secondary | ICD-10-CM | POA: Diagnosis not present

## 2021-05-05 ENCOUNTER — Other Ambulatory Visit: Payer: Self-pay | Admitting: Family Medicine

## 2021-05-05 DIAGNOSIS — F3340 Major depressive disorder, recurrent, in remission, unspecified: Secondary | ICD-10-CM

## 2021-05-09 ENCOUNTER — Telehealth: Payer: Self-pay | Admitting: General Surgery

## 2021-05-11 NOTE — Telephone Encounter (Signed)
error 

## 2021-05-18 DIAGNOSIS — R69 Illness, unspecified: Secondary | ICD-10-CM | POA: Diagnosis not present

## 2021-05-24 ENCOUNTER — Other Ambulatory Visit: Payer: Self-pay | Admitting: Internal Medicine

## 2021-05-24 DIAGNOSIS — E785 Hyperlipidemia, unspecified: Secondary | ICD-10-CM

## 2021-06-08 DIAGNOSIS — H269 Unspecified cataract: Secondary | ICD-10-CM | POA: Diagnosis not present

## 2021-06-08 DIAGNOSIS — H524 Presbyopia: Secondary | ICD-10-CM | POA: Diagnosis not present

## 2021-06-09 DIAGNOSIS — H524 Presbyopia: Secondary | ICD-10-CM | POA: Diagnosis not present

## 2021-06-09 DIAGNOSIS — H5203 Hypermetropia, bilateral: Secondary | ICD-10-CM | POA: Diagnosis not present

## 2021-06-09 DIAGNOSIS — H52209 Unspecified astigmatism, unspecified eye: Secondary | ICD-10-CM | POA: Diagnosis not present

## 2021-06-18 ENCOUNTER — Other Ambulatory Visit: Payer: Self-pay | Admitting: Family Medicine

## 2021-06-18 ENCOUNTER — Telehealth: Payer: Self-pay | Admitting: Family Medicine

## 2021-06-18 MED ORDER — AMLODIPINE BESYLATE 5 MG PO TABS: ORAL_TABLET | ORAL | 2 refills | Status: DC

## 2021-06-18 NOTE — Telephone Encounter (Signed)
Pt called and left a Voicemail stating that [facility] sent him a text that Dr.Matthews will not refill his Amlodipine. Patient was here in April and was told to F/U in 6 months so can someone call in his Amlodipine? Please let patient know

## 2021-06-19 ENCOUNTER — Other Ambulatory Visit: Payer: Self-pay | Admitting: Internal Medicine

## 2021-06-19 DIAGNOSIS — E785 Hyperlipidemia, unspecified: Secondary | ICD-10-CM

## 2021-06-19 NOTE — Telephone Encounter (Signed)
I called pt to let him know

## 2021-07-02 ENCOUNTER — Other Ambulatory Visit: Payer: Self-pay | Admitting: Family Medicine

## 2021-07-02 ENCOUNTER — Other Ambulatory Visit: Payer: Self-pay | Admitting: Internal Medicine

## 2021-07-02 DIAGNOSIS — E785 Hyperlipidemia, unspecified: Secondary | ICD-10-CM

## 2021-07-10 DIAGNOSIS — R140 Abdominal distension (gaseous): Secondary | ICD-10-CM

## 2021-07-10 DIAGNOSIS — R197 Diarrhea, unspecified: Secondary | ICD-10-CM

## 2021-07-10 DIAGNOSIS — K50119 Crohn's disease of large intestine with unspecified complications: Secondary | ICD-10-CM

## 2021-07-11 ENCOUNTER — Other Ambulatory Visit: Payer: Self-pay

## 2021-07-11 DIAGNOSIS — R69 Illness, unspecified: Secondary | ICD-10-CM | POA: Diagnosis not present

## 2021-07-11 MED ORDER — BUDESONIDE ER 9 MG PO TB24: 9.0000 mg | ORAL_TABLET | Freq: Every day | ORAL | 1 refills | Status: DC

## 2021-07-11 NOTE — Telephone Encounter (Signed)
Sent in already

## 2021-07-11 NOTE — Telephone Encounter (Signed)
Dr. Bryan Lemma, it looks like Entocort was sent in not Uceris. Please advise, thanks.   Lab orders in epic. Patient notified of recommendations via my chart.

## 2021-07-12 ENCOUNTER — Telehealth: Payer: Self-pay

## 2021-07-12 DIAGNOSIS — K5010 Crohn's disease of large intestine without complications: Secondary | ICD-10-CM

## 2021-07-12 NOTE — Telephone Encounter (Signed)
Prior Auth for Budesonide ER 9 MG has been initiated over the phone [phone]) with [facility], spoke with Yvone Neu, Ref # [id number]. May take 24 to 72 hours

## 2021-07-13 NOTE — Telephone Encounter (Signed)
Received Approval for Budesonide from [facility]. The authorization is good until 12/22/2021

## 2021-07-18 NOTE — Telephone Encounter (Signed)
Patient is returning your call.  

## 2021-07-19 MED ORDER — MESALAMINE 1.2 G PO TBEC: 1.2000 g | DELAYED_RELEASE_TABLET | Freq: Two times a day (BID) | ORAL | 3 refills | Status: DC

## 2021-07-19 NOTE — Telephone Encounter (Signed)
Patient called stating that the budesonide 64m tablets that he takes 3 tablets in the morning aren't helping him. He has been experiencing constant diarrhea. He said that this medication doesn't help. He does have an appointment on Monday and will be here for it. But wanted to let yall know as a FYI per patient. I am trying to do a tier exemption for patient since PA has already been approved for mesalamine 1.2 tablets earlier this year. Ref number [id number] take at least 24-72 hours to hear back if it was approved or not       LVM for patient to call back  He wants to see about the mesalamine 1.2g tablets getting approved. I told him I would look at seeing about the mesalamine 1.2 at 2 times a day and he was ok with that. PA is actually on file for this medication as being approved for the rest of the year. But its 285 for 90 days. Local pharmacy said that patient doesn't want to be on the mesalamine 0.3735m Insurance stated that sulfasalazine and balsalazide are his two choices and I told them that he is allergic to sulfasalazine and they said he could possibly be allergic to balsalazide as well. They said we can do a tier exception ([phone] low income subsidy ([phone] the patient.  Currently mesalamine 1.2 g tablet to take 2 times a day is currently 285 dollars at the local pharmacy for 90 days. PA that is on file for this medication is good until 12-22-2021 with [employer] and it is scanned in the patient's chart as well and verified by the insurance company as well for the delayed released [facility]. DR and EC are same I was told by insurance. Mail order is 275 for 90 days. Working on tier exemption at the moment.

## 2021-07-19 NOTE — Addendum Note (Signed)
Addended by: Curlene Labrum E on: 07/19/2021 12:39 PM   Modules accepted: Orders

## 2021-07-20 NOTE — Telephone Encounter (Signed)
Per Linwood Dibbles peer to peer can't be done for a tier exemption for this patient's medication. She said we can fax an appeal to [phone] with the ref number [id number] attached

## 2021-07-23 ENCOUNTER — Other Ambulatory Visit: Payer: Self-pay

## 2021-07-23 ENCOUNTER — Encounter: Payer: Self-pay | Admitting: Gastroenterology

## 2021-07-23 ENCOUNTER — Ambulatory Visit (INDEPENDENT_AMBULATORY_CARE_PROVIDER_SITE_OTHER): Payer: Medicare HMO | Admitting: Gastroenterology

## 2021-07-23 ENCOUNTER — Other Ambulatory Visit (INDEPENDENT_AMBULATORY_CARE_PROVIDER_SITE_OTHER): Payer: Medicare HMO

## 2021-07-23 VITALS — BP 120/78 | HR 84 | Ht 69.0 in | Wt 183.5 lb

## 2021-07-23 DIAGNOSIS — N183 Chronic kidney disease, stage 3 unspecified: Secondary | ICD-10-CM

## 2021-07-23 DIAGNOSIS — K5010 Crohn's disease of large intestine without complications: Secondary | ICD-10-CM

## 2021-07-23 DIAGNOSIS — K50119 Crohn's disease of large intestine with unspecified complications: Secondary | ICD-10-CM

## 2021-07-23 DIAGNOSIS — R197 Diarrhea, unspecified: Secondary | ICD-10-CM

## 2021-07-23 DIAGNOSIS — R140 Abdominal distension (gaseous): Secondary | ICD-10-CM

## 2021-07-23 MED ORDER — PREDNISONE 10 MG PO TABS: ORAL_TABLET | ORAL | 0 refills | Status: AC

## 2021-07-23 MED ORDER — MESALAMINE 1.2 G PO TBEC: 4.8000 g | DELAYED_RELEASE_TABLET | Freq: Every day | ORAL | 3 refills | Status: DC

## 2021-07-23 NOTE — Telephone Encounter (Signed)
Per Kia T with [employer] patient can do mesalamine 1.2 g tablet 4 times daily, 360 pills for 90 days still at 275 dollars.

## 2021-07-23 NOTE — Progress Notes (Signed)
Chief Complaint:    Crohn's Disease, Medication management  GI History: 65 year old male with a history of CKD (creatinine ~1.3), GERD, HTN, HLD, follows in the GI clinic for Crohn's Colitis and GERD.   1) Crohn's Disease: longstanding history of Crohn's colitis, previously followed  [facility] at [facility].  Initially seen by me on 04/23/2028.  Prior to that, was treated with budesonide in 10/2019.  Restarted Lialda 4.8 g/day in 03/2020 for breakthrough symptoms with good clinical response.  Had breakthrough when weaning to 2.4 g/day.  Responded with slight increase to 3.6 g/day.  Since then, has had issues obtaining Lialda due to insurance coverage. - ESR/CRP not reliably elevated during flare - Has a sulfa allergy-cannot do sulfasalazine   He is an avid mountain biker.    IBD History:  Diagnosed in his 20's Index symptoms: Abdominal cramping, loose, nonbloody stools. No UGI sxs. No nocturnal stools or tenesmus.    Evaluation to date: - TPMT: N/A - TB testing: N/A - HBV status: N/A - Pertinent Imaging: None for review - Last colonoscopy:  05/2020 - Small bowel imaging: None - History of EIMs: None   Current medication: Lialda 3.6 gm/day (restarting today)  Previous medications: Budesonide ER, Pentasa, Lialda, Azulfidine, prednisone.  No previous immunosuppressive therapy   Health Maintenance: - DEXA: Declined - Vaccinations:      - Annual Flu Vaccine -UTD      - Pneumococcal Vaccine if receiving immunosuppression: N/A      - Zoster vaccine if over age 45: Obtaining record - Micronutrient eval:      - Annual Vit D, B6, iron panel: Order on follow-up      - Ileal disease: B12, fat soluble vitamins: Ordered on follow-up - Surveillance colonoscopy: Ordered today - Surveillance labs for immunomodulators: N/A - Annual depression screening: None - Annual Dermatology/Skin exam: N/A   Endoscopic Hx: -Colonoscopy (05/2020, Dr. Bryan Lemma): Moderate, active colitis (erythema, edema,  aphthous ulcers) from transverse colon to cecum.  Mild inflammation in the left colon in a patchy distribution with rectal sparing.  Sigmoid diverticulosis.  Internal hemorrhoids.  Normal TI.  Benign rectal hyperplastic polyps -Colonoscopy (10/2015, Dr. Gala Romney): Mild inflammatory changes, mild to moderate chronic colitis predominantly on the right sided endoscopically, for biopsies throughout colon with Crohn's disease, aside from rectal sparing.  Normal TI -EGD (02/01/2015, Dr. Gala Romney): 1 cm salmon-colored tongue x2 (biopsy: Gastritis), patulous EG junction with 2-3 cm HH, normal stomach and duodenum -Colonoscopy (2007, Dr. Sabino Donovan): Normal TI, ascending colon/cecum mild inflammation but biopsies without active inflammation, hyperplastic polyp, possible ischemia on path left colon biopsy   2) GERD: History of GERD and hiatal hernia.  Reflux symptoms well controlled on Nexium 40 mg BID.  Was previously treated with omeprazole 40 mg bid.  HPI:     [name] presenting to the Gastroenterology Clinic for follow-up.  Last seen by me in 08/10/2020.  At that time, was feeling well on Lialda 3.6 g/day.  Did have active colitis on colonoscopy in 05/2020, but patient preferred continued Lialda rather than escalating therapy.  Unfortunately, since then, he had a change in insurance on 12/23/2020 to [facility].  Ran out of his Lialda and February, and started to have breakthrough symptoms in March.  Since then has had issues obtaining/insurance approval. - Trialed course of Apriso-breakthrough symptoms - Try prescribing  Uceris, but was given Entocort instead by insurance-breakthrough symptoms - Eventually was able to obtain  Uceris (budesonide ER) 9 mg/day with  good response.  Had constipation with that dose, and reduced to 6 mg/day without constipation.  Had been doing well, then started to have breakthrough symptoms in the last month or so - Lialda 2.4 g/day was eventually approved by his insurance, and he  picked that up 3 days ago (but not yet started).  Uses immodium prn.  Sxs worse over last week or so- diarrhea, generalized abdominal pain. Started taking an old Rx for Prednisone (exp in 2018) at 50 mg/day x3 days with some improvement.   Unfortunately, he never completed stool testing, CBC, ESR, CRP.   Review of systems:     No chest pain, no SOB, no fevers, no urinary sx   Past Medical History:  Diagnosis Date   Crohn's disease ([place])    Depression    Dyspnea on exertion    GERD (gastroesophageal reflux disease)    Herpes    History of kidney stones    Hyperlipidemia    Hypertension    Instability of knee joint    Left displaced femoral neck fracture ([place]) 07/11/2017   Snoring     Patient's surgical history, family medical history, social history, medications and allergies were all reviewed in Epic    Current Outpatient Medications  Medication Sig Dispense Refill   amLODipine (NORVASC) 5 MG tablet TAKE 1/2 TABLET IN THE MORNING AND 1 TAB IN THE EVENING (7.5MG TOTAL EACH DAY.) 135 tablet 2   Budesonide ER (UCERIS) 9 MG TB24 Take 9 mg by mouth daily. Take daily for 8 weeks 56 tablet 1   ezetimibe (ZETIA) 10 MG tablet TAKE 1 TABLET (10 MG TOTAL) BY MOUTH DAILY. NEED OV. 90 tablet 1   loperamide (IMODIUM) 2 MG capsule Take 2 mg by mouth as needed for diarrhea or loose stools.     mesalamine (LIALDA) 1.2 g EC tablet Take 1 tablet (1.2 g total) by mouth 2 (two) times daily. 180 tablet 3   mupirocin ointment (BACTROBAN) 2 % Apply to affected area twice daily for 7 days. Repeat as needed 15 g 0   olmesartan (BENICAR) 20 MG tablet TAKE 1 TABLET BY MOUTH TWICE A DAY- INS PAYS FOR 1 PER DAY 90 tablet 3   omeprazole (PRILOSEC) 40 MG capsule Take 1 capsule (40 mg total) by mouth in the morning and at bedtime. 180 capsule 3   venlafaxine (EFFEXOR) 37.5 MG tablet TAKE 1 TABLET (37.5 MG TOTAL) BY MOUTH 2 (TWO) TIMES DAILY. 180 tablet 1   Current Facility-Administered Medications   Medication Dose Route Frequency Provider Last Rate Last Admin   0.9 %  sodium chloride infusion  500 mL Intravenous Once Aanchal Cope V, DO        Physical Exam:     BP 120/78   Pulse 84   Ht _0  (1.753 m)   Wt 183 lb 8 oz (83.2 kg)   SpO2 98%   BMI 27.10 kg/m   GENERAL:  Pleasant male in NAD PSYCH: : Cooperative, normal affect Musculoskeletal:  Normal muscle tone, normal strength NEURO: Alert and oriented x 3, no focal neurologic deficits   IMPRESSION and PLAN:    1) Crohn's Disease 65 year old male longstanding history of Crohn's Colitis.  Previously in clinical remission with Lialda 3.6 g/day.  Unfortunately, due to insurance issues, had to trial multiple different medications earlier this year for breakthrough symptoms after he ran out of his Lialda in February.  No response to Apriso or Entocort.  Did respond to budesonide, but decrease the  dose due to constipation.  Breakthrough symptoms over the last month or so, and finally able to obtain Lialda last week.  However, cost of the Doristine Johns is an issue for the patient as well despite being approved by [facility].  Further complicating matters, patient never went for stool studies or labs.  Discussed the need for those studies at length today.  Plan for the following:  - Continue Lialda at 3.6 g/day - Prednisone 40 mg/daily x2 weeks then wean by 10 mg/week until complete - Go to the lab today for CBC, ESR, CRP, GI PCR panel, fecal calprotectin - I had a long discussion today about escalating to immunosuppressive/biologic therapy.  Discussed the difference between immunomodulators and biologic agents.  We will explore the cost/coverage of Entyvio with his insurance so that we have a plan if the Lialda is no longer able to capture control.  Similarly, I expressed my suspicion for escalating therapy at the last appointment based on previous colonoscopy.  Unfortunately due to cost, repeat colonoscopy not feasible for the patient, so we  are to rely on noninvasive inflammatory markers as above (although no labs in the interim for review as outlined above) - Needs to continue with routine BMP checks while on 5-ASA therapy - Should get DEXA scan in 6 months after completion of steroids - If planning on biologic agent, will need to check QuantiFERON gold, viral hepatitis status.  Less likely to use immunomodulators, so holding off on TPMT check  2) CKD - As above, needs to follow with routine BMP checks while on 5-ASA therapy  RTC in 3 months or sooner as needed  I spent 40 minutes of time, including in depth chart review, independent review of results as outlined above, communicating results with the patient directly, face-to-face time with the patient, coordinating care, ordering studies and medications as appropriate, and documentation.      Lavena Bullion ,DO, FACG 07/23/2021, 1:53 PM

## 2021-07-23 NOTE — Patient Instructions (Addendum)
If you are age 66 or older, your body mass index should be between 23-30. Your Body mass index is 27.1 kg/m. If this is out of the aforementioned range listed, please consider follow up with your Primary Care Provider.  If you are age 20 or younger, your body mass index should be between 19-25. Your Body mass index is 27.1 kg/m. If this is out of the aformentioned range listed, please consider follow up with your Primary Care Provider.   Please go to the lab on the 2nd floor [address] before you leave the office today.   We have sent the following medications to your pharmacy for you to pick up at your convenience:  Prednisone taper- follow instructions on prescription  Mesalamine   Follow up in 3 months, you will need to call to schedule at [phone] about 1 month prior 09/22/2021  Due to recent changes in healthcare laws, you may see the results of your imaging and laboratory studies on MyChart before your provider has had a chance to review them.  We understand that in some cases there may be results that are confusing or concerning to you. Not all laboratory results come back in the same time frame and the provider may be waiting for multiple results in order to interpret others.  Please give [place] 48 hours in order for your provider to thoroughly review all the results before contacting the office for clarification of your results.   Thank you for choosing me and [facility].  Vito Cirigliano, D.O.

## 2021-07-24 LAB — CBC WITH DIFFERENTIAL/PLATELET
Basophils Absolute: 0 10*3/uL (ref 0.0–0.1)
Basophils Relative: 0.2 % (ref 0.0–3.0)
Eosinophils Absolute: 0 10*3/uL (ref 0.0–0.7)
Eosinophils Relative: 0.1 % (ref 0.0–5.0)
HCT: 40.2 % (ref 39.0–52.0)
Hemoglobin: 13.7 g/dL (ref 13.0–17.0)
Lymphocytes Relative: 6.3 % — ABNORMAL LOW (ref 12.0–46.0)
Lymphs Abs: 0.6 10*3/uL — ABNORMAL LOW (ref 0.7–4.0)
MCHC: 34.1 g/dL (ref 30.0–36.0)
MCV: 91.6 fl (ref 78.0–100.0)
Monocytes Absolute: 0.2 10*3/uL (ref 0.1–1.0)
Monocytes Relative: 2.4 % — ABNORMAL LOW (ref 3.0–12.0)
Neutro Abs: 8.3 10*3/uL — ABNORMAL HIGH (ref 1.4–7.7)
Neutrophils Relative %: 91 % — ABNORMAL HIGH (ref 43.0–77.0)
Platelets: 290 10*3/uL (ref 150.0–400.0)
RBC: 4.39 Mil/uL (ref 4.22–5.81)
RDW: 14 % (ref 11.5–15.5)
WBC: 9.1 10*3/uL (ref 4.0–10.5)

## 2021-07-24 LAB — BASIC METABOLIC PANEL
BUN: 29 mg/dL — ABNORMAL HIGH (ref 6–23)
CO2: 26 mEq/L (ref 19–32)
Calcium: 9.7 mg/dL (ref 8.4–10.5)
Chloride: 104 mEq/L (ref 96–112)
Creatinine, Ser: 1.4 mg/dL (ref 0.40–1.50)
GFR: 52.68 mL/min — ABNORMAL LOW (ref >60.00)
Glucose, Bld: 133 mg/dL — ABNORMAL HIGH (ref 70–99)
Potassium: 4.6 mEq/L (ref 3.5–5.1)
Sodium: 141 mEq/L (ref 135–145)

## 2021-07-24 LAB — SEDIMENTATION RATE: Sed Rate: 11 mm/hr (ref 0–20)

## 2021-07-24 LAB — HIGH SENSITIVITY CRP: CRP, High Sensitivity: 7.01 mg/L — ABNORMAL HIGH (ref 0.000–5.000)

## 2021-07-26 ENCOUNTER — Other Ambulatory Visit: Payer: Medicare HMO

## 2021-07-26 ENCOUNTER — Other Ambulatory Visit: Payer: Self-pay

## 2021-07-26 DIAGNOSIS — K50119 Crohn's disease of large intestine with unspecified complications: Secondary | ICD-10-CM | POA: Diagnosis not present

## 2021-07-26 DIAGNOSIS — R197 Diarrhea, unspecified: Secondary | ICD-10-CM | POA: Diagnosis not present

## 2021-07-26 DIAGNOSIS — R140 Abdominal distension (gaseous): Secondary | ICD-10-CM

## 2021-07-26 DIAGNOSIS — K5190 Ulcerative colitis, unspecified, without complications: Secondary | ICD-10-CM | POA: Diagnosis not present

## 2021-07-30 LAB — GI PROFILE, STOOL, PCR

## 2021-07-30 LAB — CALPROTECTIN, FECAL: Calprotectin, Fecal: 1026 ug/g — ABNORMAL HIGH (ref 0–120)

## 2021-08-02 ENCOUNTER — Telehealth: Payer: Self-pay

## 2021-08-02 MED ORDER — CIPROFLOXACIN HCL 750 MG PO TABS: 750.0000 mg | ORAL_TABLET | ORAL | 0 refills | Status: DC

## 2021-08-02 NOTE — Telephone Encounter (Signed)
-----   Message from [place], DO sent at 08/01/2021  6:07 PM EDT ----- GI PCR panel demonstrates enteropathogenic E. coli positive.  This is typically treated with conservative management; fluids, rest.  Does not necessarily need antibiotics.  1 caveat is with prolonged or severe diarrhea.  Based on his symptomatology during his last appointment and associated IBD, reasonable to give ciprofloxacin 750 mg p.o. x1 dose.  Calprotectin also significantly elevated at 1026.  This is due to active inflammation of the colon and can be used as a surrogate marker for tracking inflammation in response to therapy in the future.

## 2021-08-02 NOTE — Telephone Encounter (Signed)
Patient made aware.

## 2021-08-05 ENCOUNTER — Encounter: Payer: Self-pay | Admitting: Family Medicine

## 2021-08-05 DIAGNOSIS — R931 Abnormal findings on diagnostic imaging of heart and coronary circulation: Secondary | ICD-10-CM

## 2021-08-07 NOTE — Telephone Encounter (Signed)
Okay to place cardiology referral to Dr. Sterling Big downstairs

## 2021-08-07 NOTE — Telephone Encounter (Signed)
Opened in error. Charyl Bigger, CMA

## 2021-09-13 DIAGNOSIS — R69 Illness, unspecified: Secondary | ICD-10-CM | POA: Diagnosis not present

## 2021-09-18 NOTE — Progress Notes (Signed)
Referring-Tyler Rodena Piety, DO Reason for referral-coronary artery disease  HPI: 65 year old male for evaluation of coronary artery disease at request of Weston Settle, DO.  Patient has been previously followed by Dr. Debara Pickett and last seen December 21, 2019.  Nuclear study August 2019 showed ejection fraction 45% but visually appeared better.  Perfusion was normal.  Calcium score July 2019 410 which was 85th percentile for age and sex matched control.  Since last seen patient denies dyspnea, chest pain, palpitations or syncope.  He is an avid biker and can bike 1 hour with no symptoms.  Current Outpatient Medications  Medication Sig Dispense Refill   amLODipine (NORVASC) 5 MG tablet TAKE 1/2 TABLET IN THE MORNING AND 1 TAB IN THE EVENING (7.5MG TOTAL EACH DAY.) 135 tablet 2   ezetimibe (ZETIA) 10 MG tablet TAKE 1 TABLET (10 MG TOTAL) BY MOUTH DAILY. NEED OV. 90 tablet 1   loperamide (IMODIUM) 2 MG capsule Take 2 mg by mouth as needed for diarrhea or loose stools.     mupirocin ointment (BACTROBAN) 2 % Apply to affected area twice daily for 7 days. Repeat as needed 15 g 0   olmesartan (BENICAR) 20 MG tablet TAKE 1 TABLET BY MOUTH TWICE A DAY- INS PAYS FOR 1 PER DAY (Patient taking differently: Take 20 mg by mouth daily.) 90 tablet 3   omeprazole (PRILOSEC) 40 MG capsule Take 1 capsule (40 mg total) by mouth in the morning and at bedtime. 180 capsule 3   venlafaxine (EFFEXOR) 37.5 MG tablet TAKE 1 TABLET (37.5 MG TOTAL) BY MOUTH 2 (TWO) TIMES DAILY. 180 tablet 1   ciprofloxacin (CIPRO) 750 MG tablet Take 1 tablet (750 mg total) by mouth as directed. (Patient not taking: Reported on 09/26/2021) 1 tablet 0   mesalamine (LIALDA) 1.2 g EC tablet Take 4 tablets (4.8 g total) by mouth daily with breakfast. 120 tablet 3   Current Facility-Administered Medications  Medication Dose Route Frequency Provider Last Rate Last Admin   0.9 %  sodium chloride infusion  500 mL Intravenous Once Cirigliano, Vito V, DO         Allergies  Allergen Reactions   Sulfasalazine Other (See Comments)    Flu like feelings   Statins Other (See Comments)    REACTION: Myalgia     Past Medical History:  Diagnosis Date   Crohn's disease ([place])    Depression    Dyspnea on exertion    GERD (gastroesophageal reflux disease)    Herpes    History of kidney stones    Hyperlipidemia    Hypertension    Instability of knee joint    Left displaced femoral neck fracture ([place]) 07/11/2017   Snoring     Past Surgical History:  Procedure Laterality Date   COLONOSCOPY  2007   Dr. Sabino Donovan normal TI, ascending colon/cecum with mild inflammation noted but biopses without signs of active inflammation, hyperpastic polyp, possible ischemia on path of left colon biopsy   COLONOSCOPY N/A 11/08/2015   Procedure: COLONOSCOPY;  Surgeon: [name], MD;  Location: [facility];  Service: Endoscopy;  Laterality: N/A;   ESOPHAGOGASTRODUODENOSCOPY  2007   Dr. Sabino Donovan reflux esophagitis, hiatal hernia, negative H.pylori   ESOPHAGOGASTRODUODENOSCOPY N/A 11/08/2015   Procedure: ESOPHAGOGASTRODUODENOSCOPY (EGD);  Surgeon: [name], MD;  Location: [facility];  Service: Endoscopy;  Laterality: N/A;  0830 - moved to 8:15 - office to notify   HIP PINNING,CANNULATED Left 07/12/2017   Procedure: CANNULATED HIP PINNING LEFT;  Surgeon: Rod Can, MD;  Location: [facility];  Service: Orthopedics;  Laterality: Left;    Social History   Socioeconomic History   Marital status: Married    Spouse name: Not on file   Number of children: 2   Years of education: Not on file   Highest education level: Not on file  Occupational History   Occupation: Retired  Tobacco Use   Smoking status: Never   Smokeless tobacco: Never  Vaping Use   Vaping Use: Never used  Substance and Sexual Activity   Alcohol use: Yes    Alcohol/week: 0.0 standard drinks    Comment: glass a wine once a week   Drug use: No   Sexual activity: Not Currently    Birth  control/protection: None  Other Topics Concern   Not on file  Social History Narrative   Not on file   Social Determinants of Health   Financial Resource Strain: Not on file  Food Insecurity: Not on file  Transportation Needs: Not on file  Physical Activity: Not on file  Stress: Not on file  Social Connections: Not on file  Intimate Partner Violence: Not on file    Family History  Problem Relation Age of Onset   Melanoma Father    Heart disease Father    Healthy Mother    Colon cancer Neg Hx    Esophageal cancer Neg Hx     ROS: no fevers or chills, productive cough, hemoptysis, dysphasia, odynophagia, melena, hematochezia, dysuria, hematuria, rash, seizure activity, orthopnea, PND, pedal edema, claudication. Remaining systems are negative.  Physical Exam:   Blood pressure (!) 146/100, pulse 81, height 5' 9"  (1.753 m), weight 188 lb 12.8 oz (85.6 kg), SpO2 96 %.  General:  Well developed/well nourished in NAD Skin warm/dry Patient not depressed No peripheral clubbing Back-normal HEENT-normal/normal eyelids Neck supple/normal carotid upstroke bilaterally; no bruits; no JVD; no thyromegaly chest - CTA/ normal expansion CV - RRR/normal S1 and S2; no murmurs, rubs or gallops;  PMI nondisplaced Abdomen -NT/ND, no HSM, no mass, + bowel sounds, no bruit 2+ femoral pulses, no bruits Ext-no edema, chords, 2+ DP Neuro-grossly nonfocal  ECG -normal sinus rhythm at a rate of 81, right bundle branch block.  Personally reviewed  A/P  1 coronary artery disease-patient denies chest pain.  Previous nuclear study showed no ischemia.  Plan to continue medical therapy.  We will add aspirin 81 mg daily.  He is intolerant to statins.  Note LV function decreased on previous nuclear study but visually appeared better.  We will arrange echocardiogram to better assess LV function.  2 hyperlipidemia-patient is intolerant to statins.  Continue Zetia.  Check lipids and if LDL greater than 70  will need to consider addition of Repatha or Praluent.  Also note he apparently tolerated low-dose statin in the past.  We may try pravastatin if his LDL is greater than 70.  He did not tolerate Lipitor previously.  3 hypertension-blood pressure elevated; increase amlodipine to 10 mg daily.  Goal systolic blood pressure 117 and diastolic 85.  Kirk Ruths, MD

## 2021-09-26 ENCOUNTER — Other Ambulatory Visit: Payer: Self-pay

## 2021-09-26 ENCOUNTER — Encounter: Payer: Self-pay | Admitting: Cardiology

## 2021-09-26 ENCOUNTER — Ambulatory Visit: Payer: Medicare HMO | Admitting: Cardiology

## 2021-09-26 VITALS — BP 146/100 | HR 81 | Ht 69.0 in | Wt 188.8 lb

## 2021-09-26 DIAGNOSIS — E785 Hyperlipidemia, unspecified: Secondary | ICD-10-CM

## 2021-09-26 DIAGNOSIS — I2510 Atherosclerotic heart disease of native coronary artery without angina pectoris: Secondary | ICD-10-CM | POA: Diagnosis not present

## 2021-09-26 DIAGNOSIS — I10 Essential (primary) hypertension: Secondary | ICD-10-CM

## 2021-09-26 MED ORDER — ASPIRIN EC 81 MG PO TBEC: 81.0000 mg | DELAYED_RELEASE_TABLET | Freq: Every day | ORAL | 3 refills | Status: DC

## 2021-09-26 MED ORDER — AMLODIPINE BESYLATE 10 MG PO TABS: 10.0000 mg | ORAL_TABLET | Freq: Every day | ORAL | 3 refills | Status: DC

## 2021-09-26 NOTE — Patient Instructions (Signed)
Medication Instructions:   INCREASE AMLODIPINE TO 10 MG ONCE DAILY= 2 OF THE 5 MG TABLETS ONCE DAILY  START ASPIRIN 81 MG ONCE DAILY  *If you need a refill on your cardiac medications before your next appointment, please call your pharmacy*   Follow-Up: At [facility], you and your health needs are our priority.  As part of our continuing mission to provide you with exceptional heart care, we have created designated Provider Care Teams.  These Care Teams include your primary Cardiologist (physician) and Advanced Practice Providers (APPs -  Physician Assistants and Nurse Practitioners) who all work together to provide you with the care you need, when you need it.  We recommend signing up for the patient portal called "MyChart".  Sign up information is provided on this After Visit Summary.  MyChart is used to connect with patients for Virtual Visits (Telemedicine).  Patients are able to view lab/test results, encounter notes, upcoming appointments, etc.  Non-urgent messages can be sent to your provider as well.   To learn more about what you can do with MyChart, go to [email or web address].    Your next appointment:   6 month(s)  The format for your next appointment:   In Person  Provider:   Kirk Ruths, MD

## 2021-09-27 LAB — LIPID PANEL
Chol/HDL Ratio: 8.5 ratio — ABNORMAL HIGH (ref 0.0–5.0)
Cholesterol, Total: 265 mg/dL — ABNORMAL HIGH (ref 100–199)
HDL: 31 mg/dL — ABNORMAL LOW (ref >39)
LDL Chol Calc (NIH): 141 mg/dL — ABNORMAL HIGH (ref 0–99)
Triglycerides: 497 mg/dL — ABNORMAL HIGH (ref 0–149)
VLDL Cholesterol Cal: 93 mg/dL — ABNORMAL HIGH (ref 5–40)

## 2021-10-03 NOTE — Telephone Encounter (Signed)
Blood pressure is perfect on his present medications.  If he would prefer to be on lower dose amlodipine for improved energy that he describes could decrease to 5 mg daily and add low-dose diuretic to keep his blood pressure controlled.  Otherwise can continue higher dose amlodipine at 10 mg daily.  Kirk Ruths

## 2021-10-30 ENCOUNTER — Ambulatory Visit: Payer: Medicare HMO

## 2021-10-30 NOTE — Progress Notes (Deleted)
Patient ID: Tyler Gates                 DOB: Feb 20, 1956                    MRN: [id number]     HPI: Tyler Gates is a 65 y.o. male patient referred to lipid clinic by Dr Stanford Breed. PMH is significant for   Current Medications: Zetia 67m daily Intolerances:  Risk Factors:  LDL goal:   Diet:   Exercise:   Family History:   Social History:   Labs: TC 265, Trigs 497, HDL 31, LDL 141 (09/26/21)  Coronary calcium score of 410 . This was 875th percentile for age and sex matched control. (2019)  Past Medical History:  Diagnosis Date   Crohn's disease ([place]    Depression    Dyspnea on exertion    GERD (gastroesophageal reflux disease)    Herpes    History of kidney stones    Hyperlipidemia    Hypertension    Instability of knee joint    Left displaced femoral neck fracture ([facility]) 07/11/2017   Snoring     Current Outpatient Medications on File Prior to Visit  Medication Sig Dispense Refill   amLODipine (NORVASC) 10 MG tablet Take 1 tablet (10 mg total) by mouth daily. 90 tablet 3   aspirin EC 81 MG tablet Take 1 tablet (81 mg total) by mouth daily. Swallow whole. 90 tablet 3   ciprofloxacin (CIPRO) 750 MG tablet Take 1 tablet (750 mg total) by mouth as directed. (Patient not taking: Reported on 09/26/2021) 1 tablet 0   ezetimibe (ZETIA) 10 MG tablet TAKE 1 TABLET (10 MG TOTAL) BY MOUTH DAILY. NEED OV. 90 tablet 1   loperamide (IMODIUM) 2 MG capsule Take 2 mg by mouth as needed for diarrhea or loose stools.     mesalamine (LIALDA) 1.2 g EC tablet Take 4 tablets (4.8 g total) by mouth daily with breakfast. 120 tablet 3   mupirocin ointment (BACTROBAN) 2 % Apply to affected area twice daily for 7 days. Repeat as needed 15 g 0   olmesartan (BENICAR) 20 MG tablet TAKE 1 TABLET BY MOUTH TWICE A DAY- INS PAYS FOR 1 PER DAY (Patient taking differently: Take 20 mg by mouth daily.) 90 tablet 3   omeprazole (PRILOSEC) 40 MG capsule Take 1 capsule (40 mg total) by mouth in the morning  and at bedtime. 180 capsule 3   venlafaxine (EFFEXOR) 37.5 MG tablet TAKE 1 TABLET (37.5 MG TOTAL) BY MOUTH 2 (TWO) TIMES DAILY. 180 tablet 1   Current Facility-Administered Medications on File Prior to Visit  Medication Dose Route Frequency Provider Last Rate Last Admin   0.9 %  sodium chloride infusion  500 mL Intravenous Once Cirigliano, Vito V, DO        Allergies  Allergen Reactions   Sulfasalazine Other (See Comments)    Flu like feelings   Statins Other (See Comments)    REACTION: Myalgia    Assessment/Plan:  1. Hyperlipidemia -

## 2021-11-02 ENCOUNTER — Other Ambulatory Visit: Payer: Self-pay | Admitting: Family Medicine

## 2021-11-02 DIAGNOSIS — F3340 Major depressive disorder, recurrent, in remission, unspecified: Secondary | ICD-10-CM

## 2021-11-06 ENCOUNTER — Other Ambulatory Visit: Payer: Self-pay | Admitting: Gastroenterology

## 2021-11-26 ENCOUNTER — Other Ambulatory Visit: Payer: Self-pay

## 2021-11-26 ENCOUNTER — Encounter: Payer: Self-pay | Admitting: Cardiology

## 2021-11-26 MED ORDER — AMLODIPINE BESYLATE 5 MG PO TABS
10.0000 mg | ORAL_TABLET | Freq: Every day | ORAL | 3 refills | Status: DC
Start: 2021-11-26 — End: 2022-07-17

## 2021-12-07 ENCOUNTER — Encounter: Payer: Self-pay | Admitting: Gastroenterology

## 2021-12-07 DIAGNOSIS — K50119 Crohn's disease of large intestine with unspecified complications: Secondary | ICD-10-CM

## 2021-12-07 DIAGNOSIS — R197 Diarrhea, unspecified: Secondary | ICD-10-CM

## 2021-12-10 NOTE — Telephone Encounter (Signed)
Dr. Lyndel Safe as DOD AM of 12/10/22 please advise, thanks.   This is a Cirigliano pt with a history of Crohn's disease, possibly having a flare. Details below. Thanks

## 2021-12-11 NOTE — Telephone Encounter (Signed)
I have reviewed previous notes  Has Crohn's colitis.  Having intermittent diarrhea without any blood in the stool.   Was well controlled on mesalamine 4.8 g/day  Plan: -Stool studies for GI Pathogen (includes C. Diff), fecal elastase and Calprotectin. -Check CBC, CMP, CRP -Hold off on steroids (budesonide or prednisone) currently -Must continue mesalamine 4.8 g/day -FU appt APP clinic or with Dr. Bryan Lemma in 2-4 weeks -He is to call us if he starts having any new problems or nocturnal diarrhea.  RG

## 2021-12-11 NOTE — Telephone Encounter (Signed)
Lab orders in epic. Pt is scheduled to see Dr. Bryan Lemma for a follow up on 01/11/22 at 1:40 pm at the [facility] office. Pt notified of recommendations via my chart.

## 2021-12-13 ENCOUNTER — Other Ambulatory Visit (INDEPENDENT_AMBULATORY_CARE_PROVIDER_SITE_OTHER): Payer: Medicare HMO

## 2021-12-13 DIAGNOSIS — R197 Diarrhea, unspecified: Secondary | ICD-10-CM

## 2021-12-13 DIAGNOSIS — K50119 Crohn's disease of large intestine with unspecified complications: Secondary | ICD-10-CM

## 2021-12-13 LAB — COMPREHENSIVE METABOLIC PANEL
ALT: 15 U/L (ref 0–53)
AST: 17 U/L (ref 0–37)
Albumin: 4.1 g/dL (ref 3.5–5.2)
Alkaline Phosphatase: 73 U/L (ref 39–117)
BUN: 23 mg/dL (ref 6–23)
CO2: 28 mEq/L (ref 19–32)
Calcium: 9.5 mg/dL (ref 8.4–10.5)
Chloride: 102 mEq/L (ref 96–112)
Creatinine, Ser: 1.55 mg/dL — ABNORMAL HIGH (ref 0.40–1.50)
GFR: 46.5 mL/min — ABNORMAL LOW (ref >60.00)
Glucose, Bld: 79 mg/dL (ref 70–99)
Potassium: 4.4 mEq/L (ref 3.5–5.1)
Sodium: 136 mEq/L (ref 135–145)
Total Bilirubin: 0.4 mg/dL (ref 0.2–1.2)
Total Protein: 7.8 g/dL (ref 6.0–8.3)

## 2021-12-13 LAB — CBC WITH DIFFERENTIAL/PLATELET
Basophils Absolute: 0 10*3/uL (ref 0.0–0.1)
Basophils Relative: 0.2 % (ref 0.0–3.0)
Eosinophils Absolute: 0.1 10*3/uL (ref 0.0–0.7)
Eosinophils Relative: 0.8 % (ref 0.0–5.0)
HCT: 41.7 % (ref 39.0–52.0)
Hemoglobin: 14 g/dL (ref 13.0–17.0)
Lymphocytes Relative: 13.8 % (ref 12.0–46.0)
Lymphs Abs: 1.4 10*3/uL (ref 0.7–4.0)
MCHC: 33.5 g/dL (ref 30.0–36.0)
MCV: 90 fl (ref 78.0–100.0)
Monocytes Absolute: 1.3 10*3/uL — ABNORMAL HIGH (ref 0.1–1.0)
Monocytes Relative: 13.2 % — ABNORMAL HIGH (ref 3.0–12.0)
Neutro Abs: 7.3 10*3/uL (ref 1.4–7.7)
Neutrophils Relative %: 72 % (ref 43.0–77.0)
Platelets: 321 10*3/uL (ref 150.0–400.0)
RBC: 4.64 Mil/uL (ref 4.22–5.81)
RDW: 13.4 % (ref 11.5–15.5)
WBC: 10.1 10*3/uL (ref 4.0–10.5)

## 2021-12-13 LAB — C-REACTIVE PROTEIN: CRP: 1.9 mg/dL (ref 0.5–20.0)

## 2021-12-14 ENCOUNTER — Other Ambulatory Visit: Payer: Medicare HMO

## 2021-12-14 ENCOUNTER — Telehealth: Payer: Self-pay

## 2021-12-14 DIAGNOSIS — K5010 Crohn's disease of large intestine without complications: Secondary | ICD-10-CM

## 2021-12-14 DIAGNOSIS — R197 Diarrhea, unspecified: Secondary | ICD-10-CM | POA: Diagnosis not present

## 2021-12-14 DIAGNOSIS — K50119 Crohn's disease of large intestine with unspecified complications: Secondary | ICD-10-CM | POA: Diagnosis not present

## 2021-12-14 NOTE — Telephone Encounter (Signed)
Dr Bryan Lemma this patient lab work came back and he dropped his stool specimen off today (12-14-2021). He wanted to let you know that he has had diarrhea Wednesday night and Thursday night as a FYI. He said he has the prednisone script as well. Said that he hasn't had a really bad day but hasn't had good days as well. Any advice?

## 2021-12-14 NOTE — Telephone Encounter (Signed)
.  A user error has taken place: error

## 2021-12-17 LAB — GI PROFILE, STOOL, PCR

## 2021-12-17 LAB — CALPROTECTIN, FECAL: Calprotectin, Fecal: 1177 ug/g — ABNORMAL HIGH (ref 0–120)

## 2021-12-19 NOTE — Telephone Encounter (Signed)
Patient stated that things have started to calm down some and he does take imodium but he is better than it has been in a couple of weeks but still not the best. Labs placed for [facility] and patient knows to go before his appointment day.

## 2021-12-22 LAB — PANCREATIC ELASTASE, FECAL: Pancreatic Elastase-1, Stool: 185 mcg/g — ABNORMAL LOW

## 2021-12-27 ENCOUNTER — Other Ambulatory Visit (INDEPENDENT_AMBULATORY_CARE_PROVIDER_SITE_OTHER): Payer: Medicare HMO

## 2021-12-27 DIAGNOSIS — K5010 Crohn's disease of large intestine without complications: Secondary | ICD-10-CM

## 2021-12-28 LAB — HEPATITIS C ANTIBODY
Hepatitis C Ab: NONREACTIVE
SIGNAL TO CUT-OFF: 0.03 (ref <1.00)

## 2021-12-28 LAB — HEPATITIS B SURFACE ANTIGEN: Hepatitis B Surface Ag: NONREACTIVE

## 2021-12-28 LAB — HEPATITIS B SURFACE ANTIBODY,QUALITATIVE: Hep B S Ab: NONREACTIVE

## 2021-12-28 LAB — HEPATITIS A ANTIBODY, TOTAL: Hepatitis A AB,Total: NONREACTIVE

## 2021-12-30 LAB — QUANTIFERON-TB GOLD PLUS
Mitogen-NIL: 8.86 IU/mL
NIL: 0.03 IU/mL
QuantiFERON-TB Gold Plus: NEGATIVE
TB1-NIL: 0.01 IU/mL
TB2-NIL: 0.01 IU/mL

## 2022-01-03 ENCOUNTER — Telehealth: Payer: Self-pay

## 2022-01-03 NOTE — Telephone Encounter (Signed)
-----   Message from Wilcox, DO sent at 01/03/2022 10:24 AM EST ----- QuantiFERON gold is negative indicating no exposure to tuberculosis.  Negative hepatitis C, hepatitis B, and negative hepatitis A antibody.  No immunity to hepatitis B or hepatitis A on these labs.  Recommend hepatitis A/B vaccine series.  Can start this at his appointment next week with me where we will also discuss possible escalation of IBD therapy.

## 2022-01-03 NOTE — Telephone Encounter (Signed)
LVM to the patient regarding his lab result. His QuantiFERON gold is negative indicating no exposure to tuberculosis.  Negative hepatitis C.   No immunity to hepatitis B or hepatitis A on these labs.  Recommend hepatitis A/B vaccine series. He can start thepatitis A/B vaccine series at his appointment next week with Dr. Bryan Lemma.

## 2022-01-11 ENCOUNTER — Encounter: Payer: Self-pay | Admitting: Gastroenterology

## 2022-01-11 ENCOUNTER — Telehealth: Payer: Medicare HMO | Admitting: Gastroenterology

## 2022-01-11 DIAGNOSIS — K5010 Crohn's disease of large intestine without complications: Secondary | ICD-10-CM | POA: Diagnosis not present

## 2022-01-11 DIAGNOSIS — N183 Chronic kidney disease, stage 3 unspecified: Secondary | ICD-10-CM

## 2022-01-11 NOTE — Patient Instructions (Addendum)
If you are age 67 or older, your body mass index should be between 23-30. Your There is no height or weight on file to calculate BMI. If this is out of the aforementioned range listed, please consider follow up with your Primary Care Provider.  If you are age 28 or younger, your body mass index should be between 19-25. Your There is no height or weight on file to calculate BMI. If this is out of the aformentioned range listed, please consider follow up with your Primary Care Provider.   __________________________________________________________  The [facility] providers would like to encourage you to use [facility] to communicate with providers for non-urgent requests or questions.  Due to long hold times on the telephone, sending your provider a message by [facility] may be a faster and more efficient way to get a response.  Please allow 48 business hours for a response.  Please remember that this is for non-urgent requests.   Follow up in 6 months with a fecal calprotectin test and a Bone density test as well. Please advise [place] if you would like to have these tests in [place] or [place].  Please reschedule your Hepatitis A/B series of injections.  Follow up in 3 months, around 04/11/2022.   We are starting the process of prior authorization for you to start Humira injections.   Thank you for choosing me and [facility].  Vito Cirigliano, D.O.

## 2022-01-11 NOTE — Progress Notes (Signed)
Chief Complaint: Crohn's Disease  GI history: 66 year old male with a history of CKD (creatinine ~1.3), GERD, HTN, HLD, follows in the GI clinic for Crohn's Colitis and GERD.   1) Crohn's Disease: longstanding history of Crohn's colitis, previously followed by Dr. Gala Romney at [facility]. Was treated with budesonide in 10/2019.  Restarted Lialda 4.8 g/day in 03/2020 for breakthrough symptoms with good clinical response.  Had breakthrough when weaning to 2.4 g/day.  Responded with slight increase to 3.6 g/day.  Active colitis on colonoscopy in 05/2020, but patient preferred continued Lialda rather than escalation of therapy.  Had issues obtaining Lialda due to insurance.  Breakthrough with Apriso and Entocort.  Good response to repeat trial of Uceris.  Finally able to get back on Lialda in 07/2021. - ESR/CRP not reliably elevated during flare - Has a sulfa allergy-cannot do sulfasalazine   He is an avid mountain biker.     IBD History:  Diagnosed in his 20's Index symptoms: Abdominal cramping, loose, nonbloody stools. No UGI sxs. No nocturnal stools or tenesmus.    Evaluation to date: - TPMT: N/A - TB testing: Negative QuantiFERON gold - HBV status: Negative.  Ordered hepatitis A/B vaccine series - Pertinent Imaging: None for review - Last colonoscopy:  05/2020 - Small bowel imaging: None - History of EIMs: None   Current medication: Lialda 4.8 gm/day  Previous medications: Budesonide ER, Pentasa, Lialda, Azulfidine, prednisone.  No previous immunosuppressive therapy   Health Maintenance: - DEXA: Declined - Vaccinations:      - Annual Flu Vaccine -UTD      - Pneumococcal Vaccine if receiving immunosuppression: Will need to obtain Prevnar 20 with PCM      - Zoster vaccine if over age 66: Obtain through [facility] - Micronutrient eval:      - Annual Vit D, B6, iron panel: Order on follow-up      - Ileal disease: B12, fat soluble vitamins: Order on follow-up - Surveillance  colonoscopy: Will perform 6-12 months after medication changes below - Surveillance labs for immunomodulators: N/A - Annual depression screening: None - Annual Dermatology/Skin exam: Will discuss referral at follow-up   Endoscopic Hx: -Colonoscopy (05/2020, Dr. Bryan Lemma): Moderate, active colitis (erythema, edema, aphthous ulcers) from transverse colon to cecum.  Mild inflammation in the left colon in a patchy distribution with rectal sparing.  Sigmoid diverticulosis.  Internal hemorrhoids.  Normal TI.  Benign rectal hyperplastic polyps -Colonoscopy (10/2015, Dr. Gala Romney): Mild inflammatory changes, mild to moderate chronic colitis predominantly on the right sided endoscopically, for biopsies throughout colon with Crohn's disease, aside from rectal sparing.  Normal TI -EGD (02/01/2015, Dr. Gala Romney): 1 cm salmon-colored tongue x2 (biopsy: Gastritis), patulous EG junction with 2-3 cm HH, normal stomach and duodenum -Colonoscopy (2007, Dr. Sabino Donovan): Normal TI, ascending colon/cecum mild inflammation but biopsies without active inflammation, hyperplastic polyp, possible ischemia on path left colon biopsy   2) GERD: History of GERD and hiatal hernia.  Reflux symptoms well controlled on Nexium 40 mg BID.  Was previously treated with omeprazole 40 mg bid.  HPI:    Due to current restrictions/limitations of in-office visits due to the COVID-19 pandemic, this scheduled clinical appointment was converted to a telehealth virtual consultation using MyChart video  -Time of medical discussion: 25 minutes -The patient did consent to this virtual visit and is aware of possible charges through their insurance for this visit.  -Names of all parties present: Tyler Gates (patient),  [name], DO, [facility] (physician) -Patient location: Home -Physician location: Office  Tyler Gates is a 66 y.o. male presenting for routine follow-up.  Last seen by me on 07/23/2021.  At that time, was having some clinical response to  you serous 6 mg/day and was finally able to restart Lialda 3.6 g/day.  Due to active symptomatology, started prednisone 40 mg/day with taper.  Had a long discussion regarding escalation of therapy, but he was not ready to advance therapy at that time. - GI PCR panel with enteropathogenic E. coli.  Treated with Cipro 750 mg x 1 - Calprotectin elevated at 1026  Symptoms eventually abated, but started having symptoms again (diarrhea without hematochezia) in December. - Continued on mesalamine 4.8 g/day - Fecal calprotectin 1177 - Pancreatic elastase 185 - Normal CRP - GI PCR panel negative  Labs earlier this month: - Quantified on gold negative - Hepatitis panel negative.  Needs hepatitis A/B vaccine series  Today, states still with loose stools, no hematochezia. 1/2 Imodium tablet will decrease sxs. Will take a few days/week. Still with 4.8 gm/day.  He would like to discuss escalation of therapy today  Past medical history, past surgical history, social history, family history, medications, and allergies reviewed in the chart and with patient.    Past Medical History:  Diagnosis Date   Crohn's disease ([place])    Depression    Dyspnea on exertion    GERD (gastroesophageal reflux disease)    Herpes    History of kidney stones    Hyperlipidemia    Hypertension    Instability of knee joint    Left displaced femoral neck fracture ([place]) 07/11/2017   Snoring      Past Surgical History:  Procedure Laterality Date   COLONOSCOPY  2007   Dr. Sabino Donovan normal TI, ascending colon/cecum with mild inflammation noted but biopses without signs of active inflammation, hyperpastic polyp, possible ischemia on path of left colon biopsy   COLONOSCOPY N/A 11/08/2015   Procedure: COLONOSCOPY;  Surgeon: [name], MD;  [facility];  Service: Endoscopy;  Laterality: N/A;   ESOPHAGOGASTRODUODENOSCOPY  2007   Dr. Sabino Donovan reflux esophagitis, hiatal hernia, negative H.pylori    ESOPHAGOGASTRODUODENOSCOPY N/A 11/08/2015   Procedure: ESOPHAGOGASTRODUODENOSCOPY (EGD);  Surgeon: [name], MD;  [facility];  Service: Endoscopy;  Laterality: N/A;  0830 - moved to 8:15 - office to notify   HIP PINNING,CANNULATED Left 07/12/2017   Procedure: CANNULATED HIP PINNING LEFT;  Surgeon: Rod Can, MD;  Location: [facility];  Service: Orthopedics;  Laterality: Left;   Family History  Problem Relation Age of Onset   Melanoma Father    Heart disease Father    Healthy Mother    Colon cancer Neg Hx    Esophageal cancer Neg Hx    Social History   Tobacco Use   Smoking status: Never   Smokeless tobacco: Never  Vaping Use   Vaping Use: Never used  Substance Use Topics   Alcohol use: Yes    Alcohol/week: 0.0 standard drinks    Comment: glass a wine once a week   Drug use: No   Current Outpatient Medications  Medication Sig Dispense Refill   amLODipine (NORVASC) 5 MG tablet Take 2 tablets (10 mg total) by mouth daily. 180 tablet 3   aspirin EC 81 MG tablet Take 1 tablet (81 mg total) by mouth daily. Swallow whole. 90 tablet 3   ciprofloxacin (CIPRO) 750 MG tablet Take 1 tablet (750  mg total) by mouth as directed. (Patient not taking: Reported on 09/26/2021) 1 tablet 0   ezetimibe (ZETIA) 10 MG tablet TAKE 1 TABLET (10 MG TOTAL) BY MOUTH DAILY. NEED OV. 90 tablet 1   loperamide (IMODIUM) 2 MG capsule Take 2 mg by mouth as needed for diarrhea or loose stools.     mesalamine (LIALDA) 1.2 g EC tablet TAKE 4 TABLETS BY MOUTH DAILY WITH BREAKFAST. 360 tablet 1   mupirocin ointment (BACTROBAN) 2 % Apply to affected area twice daily for 7 days. Repeat as needed 15 g 0   olmesartan (BENICAR) 20 MG tablet TAKE 1 TABLET BY MOUTH TWICE A DAY- INS PAYS FOR 1 PER DAY (Patient taking differently: Take 20 mg by mouth daily.) 90 tablet 3   omeprazole (PRILOSEC) 40 MG capsule Take 1 capsule (40 mg total) by mouth in the morning and at bedtime. 180 capsule 3   venlafaxine  (EFFEXOR) 37.5 MG tablet TAKE 1 TABLET BY MOUTH 2 TIMES DAILY. 180 tablet 1   Current Facility-Administered Medications  Medication Dose Route Frequency Provider Last Rate Last Admin   0.9 %  sodium chloride infusion  500 mL Intravenous Once Ashford Clouse V, DO       Allergies  Allergen Reactions   Sulfasalazine Other (See Comments)    Flu like feelings   Statins Other (See Comments)    REACTION: Myalgia     Review of Systems: All systems reviewed and negative except where noted in HPI.     Physical Exam:    Complete physical exam not completed due to the nature of this telehealth communication.   Gen: Awake, alert, and oriented, and well communicative. HEENT: EOMI, non-icteric sclera, NCAT, MMM Neck: Normal movement of head and neck Pulm: No labored breathing, speaking in full sentences without conversational dyspnea Derm: No apparent lesions or bruising in visible field MS: Moves all visible extremities without noticeable abnormality Psych: Pleasant, cooperative, normal speech, thought processing seemingly intact   ASSESSMENT AND PLAN;   1) Crohn's Colitis 66 year old male with longstanding history of Crohn's Colitis.  Previously in clinical remission with Lialda 3.6 g/day.  Unfortunately symptoms have continued to progress with corresponding elevation in fecal calprotectin (ESR/CRP normal even in flares for him).  Still with continued symptoms at Lialda 4.8 g/day.  No response to Apriso or Entocort.  Uceris helped decrease symptoms, but no durable relief.  Had improvement with prednisone, but during taper symptoms started to return.  We had a long conversation today regarding pros/cons of escalating therapy.  Discussed Biologics vs immunomodulators.  Ultimately, he decided he would like to proceed with Humira.  - Start prior authorization for Humira - DEXA scan in 6 months - Colonoscopy 6-12 months after completion of Humira induction - Repeat fecal calprotectin in 6  months after induction of Humira - Repeat fecal pancreatic elastase at that time as well.  Was reduced on recent labs, but not sure if this truly represents EPI - QuantiFERON gold negative - Needs hepatitis A/B vaccine series - Order micronutrient panel at follow-up - Order Dermatology referral at follow-up - Sunblock, hat when out in the sun - Obtain Prevnar 20 w/ PCM - Unable to find previous zoster vaccine.  Patient to discuss with PCM and if not previously obtained, plan for zoster vaccine - Plan to titrate off Lialda once Humira at steady state - RTC in 3 months or sooner as needed  2) CKD - Continue routine BMP checks while on 5-ASA therapy  [place], DO, FACG  01/11/2022, 1:28 PM   Luetta Nutting, DO

## 2022-01-15 ENCOUNTER — Telehealth: Payer: Self-pay | Admitting: Gastroenterology

## 2022-01-15 DIAGNOSIS — Z23 Encounter for immunization: Secondary | ICD-10-CM

## 2022-01-15 MED ORDER — HUMIRA-CD/UC/HS STARTER 80 MG/0.8ML ~~LOC~~ AJKT: AUTO-INJECTOR | SUBCUTANEOUS | 0 refills | Status: DC

## 2022-01-15 MED ORDER — HUMIRA (2 PEN) 40 MG/0.4ML ~~LOC~~ AJKT: 1.0000 "pen " | AUTO-INJECTOR | SUBCUTANEOUS | 6 refills | Status: DC

## 2022-01-15 NOTE — Telephone Encounter (Signed)
Called and spoke with patient to let him know that I am in the process of working on his authorization for Humira. He has provided me with his updated prescription information. Pt requested that his RX be sent to his local pharmacy on file and not a specialty pharmacy. He is aware that I will send him a my chart message after I receive a response from his insurance. Pt verbalized understanding.   PA was initiated via CMM and I kept receiving a pop-up that states "Eligibility could not be verified for this patient - patient not found. Please review patient information and re-submit."  I called [employer] [phone]) to complete PA over the phone. I spoke with Vicente Males, and PA was approved from 01/15/22-12/12/22. Auth # [id number]. Call reference # [id number].   Per Dr. Bryan Lemma pt can begin Humira. Humira starter dose and maintenance dose sent to pharmacy [facility] on file per pt request.   Patient was notified via my chart.

## 2022-01-15 NOTE — Telephone Encounter (Signed)
Inbound call from patient calling to follow up about Humira medication. Would like to know the status

## 2022-01-16 NOTE — Telephone Encounter (Signed)
Pt is scheduled for his 1st Hep A/Hep B on Tuesday, 01/22/22 at 9:30 am at the [facility] office.  Hep A vaccine orders in epic. Heplisav B vaccine orders in epic.

## 2022-01-24 ENCOUNTER — Ambulatory Visit (INDEPENDENT_AMBULATORY_CARE_PROVIDER_SITE_OTHER): Payer: Medicare HMO | Admitting: Gastroenterology

## 2022-01-24 ENCOUNTER — Other Ambulatory Visit: Payer: Self-pay

## 2022-01-24 DIAGNOSIS — Z23 Encounter for immunization: Secondary | ICD-10-CM | POA: Diagnosis not present

## 2022-01-24 NOTE — Progress Notes (Signed)
Administered HepB vaccine, and HepA injections today.

## 2022-01-28 ENCOUNTER — Encounter: Payer: Self-pay | Admitting: Gastroenterology

## 2022-01-28 ENCOUNTER — Telehealth: Payer: Self-pay | Admitting: Gastroenterology

## 2022-01-28 ENCOUNTER — Encounter: Payer: Self-pay | Admitting: Family Medicine

## 2022-01-28 NOTE — Telephone Encounter (Signed)
Noted. Humira was too expensive for patient. He is looking into patient assistance. See 01/28/22 patient message.

## 2022-01-28 NOTE — Telephone Encounter (Signed)
Pharmacy called and stated that they have put patients Humira on hold and wanted to make [place] aware.

## 2022-01-29 ENCOUNTER — Encounter: Payer: Self-pay | Admitting: Family Medicine

## 2022-01-29 ENCOUNTER — Other Ambulatory Visit: Payer: Self-pay

## 2022-01-29 ENCOUNTER — Ambulatory Visit (INDEPENDENT_AMBULATORY_CARE_PROVIDER_SITE_OTHER): Payer: Medicare HMO | Admitting: Family Medicine

## 2022-01-29 DIAGNOSIS — J019 Acute sinusitis, unspecified: Secondary | ICD-10-CM | POA: Insufficient documentation

## 2022-01-29 DIAGNOSIS — J0100 Acute maxillary sinusitis, unspecified: Secondary | ICD-10-CM

## 2022-01-29 MED ORDER — AMOXICILLIN-POT CLAVULANATE 875-125 MG PO TABS: 1.0000 | ORAL_TABLET | Freq: Two times a day (BID) | ORAL | 0 refills | Status: DC

## 2022-01-29 NOTE — Assessment & Plan Note (Signed)
Recommend continued supportive care with increased fluids decongestant as needed.  Adding course of Augmentin due to prolonged symptoms.  Would expect symptoms to improve over the next several days.  He will contact clinic if symptoms persist.

## 2022-01-29 NOTE — Progress Notes (Signed)
Tyler Gates - 66 y.o. male MRN [id number]  Date of birth: [birth year]  Subjective Chief Complaint  Patient presents with   URI    HPI [name] is a 66 year old male here today with complaint of sinus congestion with pain and pressure.  He has had symptoms for approximately 4 weeks at this point.  Initially noted improvement however has recently had worsening symptoms.  There is reports that symptoms have improved some today.  He has not had any fever, chills, shortness of breath, wheezing or headaches.  ROS:  A comprehensive ROS was completed and negative except as noted per HPI  Allergies  Allergen Reactions   Sulfasalazine Other (See Comments)    Flu like feelings   Statins Other (See Comments)    REACTION: Myalgia    Past Medical History:  Diagnosis Date   Crohn's disease ([place])    Depression    Dyspnea on exertion    GERD (gastroesophageal reflux disease)    Herpes    History of kidney stones    Hyperlipidemia    Hypertension    Instability of knee joint    Left displaced femoral neck fracture ([place]) 07/11/2017   Snoring     Past Surgical History:  Procedure Laterality Date   COLONOSCOPY  2007   Dr. Sabino Donovan normal TI, ascending colon/cecum with mild inflammation noted but biopses without signs of active inflammation, hyperpastic polyp, possible ischemia on path of left colon biopsy   COLONOSCOPY N/A 11/08/2015   Procedure: COLONOSCOPY;  Surgeon: [name], MD;  [facility];  Service: Endoscopy;  Laterality: N/A;   ESOPHAGOGASTRODUODENOSCOPY  2007   Dr. Sabino Donovan reflux esophagitis, hiatal hernia, negative H.pylori   ESOPHAGOGASTRODUODENOSCOPY N/A 11/08/2015   Procedure: ESOPHAGOGASTRODUODENOSCOPY (EGD);  Surgeon: [name], MD;  [facility];  Service: Endoscopy;  Laterality: N/A;  0830 - moved to 8:15 - office to notify   HIP PINNING,CANNULATED Left 07/12/2017   Procedure: CANNULATED HIP PINNING LEFT;  Surgeon: Rod Can, MD;  Location: [facility];  Service: Orthopedics;  Laterality: Left;    Social History   Socioeconomic History   Marital status: Married    Spouse name: Not on file   Number of children: 2   Years of education: Not on file   Highest education level: Not on file  Occupational History   Occupation: Retired  Tobacco Use   Smoking status: Never   Smokeless tobacco: Never  Vaping Use   Vaping Use: Never used  Substance and Sexual Activity   Alcohol use: Yes    Alcohol/week: 0.0 standard drinks    Comment: glass a wine once a week   Drug use: No   Sexual activity: Not Currently    Birth control/protection: None  Other Topics Concern   Not on file  Social History Narrative   Not on file   Social Determinants of Health   Financial Resource Strain: Not on file  Food Insecurity: Not on file  Transportation Needs: Not on file  Physical Activity: Not on file  Stress: Not on file  Social Connections: Not on file    Family History  Problem Relation Age of Onset   Melanoma Father    Heart disease Father    Healthy Mother    Colon cancer Neg Hx    Esophageal cancer Neg Hx     Health Maintenance  Topic Date Due   Zoster Vaccines- Shingrix (1 of 2) Never done   COVID-19 Vaccine (3 -  Pfizer risk series) 05/22/2020   Pneumonia Vaccine 63+ Years old (1 - PCV) Never done   TETANUS/TDAP  10/22/2027   COLONOSCOPY (Pts 45-61yr Insurance coverage will need to be confirmed)  05/24/2030   INFLUENZA VACCINE  Completed   Hepatitis C Screening  Completed   HPV VACCINES  Aged Out     ----------------------------------------------------------------------------------------------------------------------------------------------------------------------------------------------------------------- Physical Exam BP 128/81 (BP Location: Left Arm, Patient Position: Sitting, Cuff Size: Normal)    Pulse (!) 103    Temp 98.2 F (36.8 C)    Ht 5' 9"  (1.753 m)    Wt 184 lb (83.5 kg)    SpO2 98%    BMI 27.17 kg/m    Physical Exam Constitutional:      Appearance: Normal appearance.  Eyes:     General: No scleral icterus. Cardiovascular:     Rate and Rhythm: Normal rate and regular rhythm.  Pulmonary:     Effort: Pulmonary effort is normal.     Breath sounds: Normal breath sounds.  Musculoskeletal:     Cervical back: Neck supple.  Neurological:     General: No focal deficit present.     Mental Status: He is alert.  Psychiatric:        Mood and Affect: Mood normal.        Behavior: Behavior normal.    ------------------------------------------------------------------------------------------------------------------------------------------------------------------------------------------------------------------- Assessment and Plan  Acute sinusitis Recommend continued supportive care with increased fluids decongestant as needed.  Adding course of Augmentin due to prolonged symptoms.  Would expect symptoms to improve over the next several days.  He will contact clinic if symptoms persist.   Meds ordered this encounter  Medications   amoxicillin-clavulanate (AUGMENTIN) 875-125 MG tablet    Sig: Take 1 tablet by mouth 2 (two) times daily.    Dispense:  20 tablet    Refill:  0    No follow-ups on file.    This visit occurred during the SARS-CoV-2 public health emergency.  Safety protocols were in place, including screening questions prior to the visit, additional usage of staff PPE, and extensive cleaning of exam room while observing appropriate contact time as indicated for disinfecting solutions.

## 2022-01-29 NOTE — Patient Instructions (Signed)

## 2022-02-07 ENCOUNTER — Encounter: Payer: Self-pay | Admitting: Cardiology

## 2022-02-07 DIAGNOSIS — E785 Hyperlipidemia, unspecified: Secondary | ICD-10-CM

## 2022-02-07 MED ORDER — EZETIMIBE 10 MG PO TABS: 10.0000 mg | ORAL_TABLET | Freq: Every day | ORAL | 0 refills | Status: DC

## 2022-02-21 ENCOUNTER — Other Ambulatory Visit: Payer: Self-pay

## 2022-02-21 ENCOUNTER — Telehealth: Payer: Self-pay

## 2022-02-21 ENCOUNTER — Ambulatory Visit (INDEPENDENT_AMBULATORY_CARE_PROVIDER_SITE_OTHER): Payer: Medicare HMO | Admitting: Gastroenterology

## 2022-02-21 DIAGNOSIS — Z23 Encounter for immunization: Secondary | ICD-10-CM | POA: Diagnosis not present

## 2022-02-21 NOTE — Telephone Encounter (Signed)
Dr Bryan Lemma,  this patient came in for his second hep b injection and mentioned that he is currently on his prednisone taper that he had a script of. The taper is for a month. He started the prednisone a week and a half ago. He said he got Covid last month and it ended up flaring his crohn's up. He wanted to let you know as a FYI.  ?

## 2022-02-22 NOTE — Telephone Encounter (Signed)
Noted! Thank you

## 2022-02-27 ENCOUNTER — Encounter: Payer: Self-pay | Admitting: Gastroenterology

## 2022-02-27 NOTE — Telephone Encounter (Signed)
Called pt to let him know we can schedule a virtual appt. Pt states he would prefer to come in for an appt now. Re-entered appt for in person follow up. Pt verbalized understanding and had no other concerns at end of call.  ?

## 2022-02-27 NOTE — Telephone Encounter (Signed)
Called pt to get him scheduled for an appt. Let him know that we could do tomorrow or the 14th. Pt stated he felt like he needed to be seen tomorrow. Scheduled pt for appt on 02/28/22 at 10 am. Pt said he could do a virtual visit or would be happy to come in if that's what was needed. He is currently scheduled for in person. Does he need to come in?  ?

## 2022-02-28 ENCOUNTER — Other Ambulatory Visit: Payer: Self-pay | Admitting: Gastroenterology

## 2022-02-28 ENCOUNTER — Other Ambulatory Visit: Payer: Self-pay

## 2022-02-28 ENCOUNTER — Ambulatory Visit: Payer: Medicare HMO | Admitting: Gastroenterology

## 2022-02-28 ENCOUNTER — Encounter: Payer: Self-pay | Admitting: Gastroenterology

## 2022-02-28 ENCOUNTER — Telehealth: Payer: Medicare HMO | Admitting: Gastroenterology

## 2022-02-28 VITALS — BP 122/80 | HR 94 | Ht 69.0 in | Wt 180.5 lb

## 2022-02-28 DIAGNOSIS — K50119 Crohn's disease of large intestine with unspecified complications: Secondary | ICD-10-CM | POA: Diagnosis not present

## 2022-02-28 DIAGNOSIS — K50919 Crohn's disease, unspecified, with unspecified complications: Secondary | ICD-10-CM

## 2022-02-28 DIAGNOSIS — N183 Chronic kidney disease, stage 3 unspecified: Secondary | ICD-10-CM

## 2022-02-28 MED ORDER — PREDNISONE 10 MG PO TABS: ORAL_TABLET | ORAL | 0 refills | Status: AC

## 2022-02-28 MED ORDER — AMBULATORY NON FORMULARY MEDICATION: 0 refills | Status: DC

## 2022-02-28 MED ORDER — PREDNISONE 10 MG PO TABS: ORAL_TABLET | ORAL | 0 refills | Status: DC

## 2022-02-28 MED ORDER — PREDNISONE 10 MG PO TABS
30.0000 mg | ORAL_TABLET | ORAL | 0 refills | Status: AC
Start: 2022-02-28 — End: 2022-03-30

## 2022-02-28 NOTE — Patient Instructions (Addendum)
If you are age 67 or older, your body mass index should be between 23-30. Your Body mass index is 26.66 kg/m?Marland Kitchen If this is out of the aforementioned range listed, please consider follow up with your Primary Care Provider. ? ?If you are age 73 or younger, your body mass index should be between 19-25. Your Body mass index is 26.66 kg/m?Marland Kitchen If this is out of the aformentioned range listed, please consider follow up with your Primary Care Provider.  ? ?________________________________________________________ ? ?The  GI providers would like to encourage you to use [facility] to communicate with providers for non-urgent requests or questions.  Due to long hold times on the telephone, sending your provider a message by [facility] may be a faster and more efficient way to get a response.  Please allow 48 business hours for a response.  Please remember that this is for non-urgent requests.  ?_______________________________________________________ ? ?We have sent the following medications to your pharmacy for you to pick up at your convenience: ?Prednisone 49m take daily for 4 week and then decrease 527mevery 2 weeks until done. ? ?Please call in 3 weeks regarding update with the new medication Inflectra please if you haven't heard anything.  ? ?Please call in 3 months to schedule a follow up appointment. ? ?It was a pleasure to see you today! ? ?ViGerrit HeckD.O. ? ?

## 2022-02-28 NOTE — Progress Notes (Signed)
Chief Complaint:    Crohn's Disease  GI History: 66 year old male with a history of CKD (creatinine ~1.3), GERD, HTN, HLD, follows in the GI clinic for Crohn's Colitis and GERD.   1) Crohn's Disease: longstanding history of Crohn's colitis, previously followed by Dr. Gala Gates at [facility]. Was treated with budesonide in 10/2019.  Restarted Lialda 4.8 g/day in 03/2020 for breakthrough symptoms with good clinical response.  Had breakthrough when weaning to 2.4 g/day.  Responded with slight increase to 3.6 g/day.  Active colitis on colonoscopy in 05/2020, but patient preferred continued Lialda rather than escalation of therapy.  Had issues obtaining Lialda due to insurance.  Breakthrough with Apriso and Entocort.  Good response to repeat trial of Uceris.  Finally able to get back on Lialda in 07/2021. - ESR/CRP not reliably elevated during flare - Has a sulfa allergy-cannot do sulfasalazine   He is an avid mountain biker.     IBD History:  Diagnosed in his 20's Index symptoms: Abdominal cramping, loose, nonbloody stools. No UGI sxs. No nocturnal stools or tenesmus.    Evaluation to date: - TPMT: N/A - TB testing: Negative QuantiFERON gold - HBV status: Negative. Started hepatitis A/B vaccine series - Pertinent Imaging: None for review - Last colonoscopy:  05/2020 - Small bowel imaging: None - History of EIMs: None   Current medication: Lialda 4.8 gm/day, prednisone 20 mg/day (on taper) Previous medications: Budesonide ER, Pentasa, Lialda, Azulfidine, prednisone.  No previous immunosuppressive therapy   Health Maintenance: - DEXA: Declined - Vaccinations:      - Annual Flu Vaccine -UTD      - Pneumococcal Vaccine if receiving immunosuppression: Will need to obtain Prevnar 20 with PCM      - Zoster vaccine if over age 36: Obtain through [facility] - Micronutrient eval:      - Annual Vit D, B6, iron panel: Order on follow-up      - Ileal disease: B12, fat soluble vitamins: Order on  follow-up - Surveillance colonoscopy: Will perform 6-12 months after medication changes below - Surveillance labs for immunomodulators: N/A - Annual depression screening: None - Annual Dermatology/Skin exam: Will discuss referral at follow-up   Endoscopic Hx: -Colonoscopy (05/2020, Dr. Bryan Gates): Moderate, active colitis (erythema, edema, aphthous ulcers) from transverse colon to cecum.  Mild inflammation in the left colon in a patchy distribution with rectal sparing.  Sigmoid diverticulosis.  Internal hemorrhoids.  Normal TI.  Benign rectal hyperplastic polyps -Colonoscopy (10/2015, Dr. Gala Gates): Mild inflammatory changes, mild to moderate chronic colitis predominantly on the right sided endoscopically, for biopsies throughout colon with Crohn's disease, aside from rectal sparing.  Normal TI -EGD (02/01/2015, Dr. Gala Gates): 1 cm salmon-colored tongue x2 (biopsy: Gastritis), patulous EG junction with 2-3 cm HH, normal stomach and duodenum -Colonoscopy (2007, Dr. Sabino Gates): Normal TI, ascending colon/cecum mild inflammation but biopsies without active inflammation, hyperplastic polyp, possible ischemia on path left colon biopsy   2) GERD: History of GERD and hiatal hernia.  Reflux symptoms well controlled on Nexium 40 mg BID.  Was previously treated with omeprazole 40 mg bid.  HPI:     [name] presenting to the Gastroenterology Clinic for follow-up.  Last seen by me on 01/11/2022.  Was having active symptoms at that time with loose stools.  No hematochezia.  Was using Imodium along with Lialda 4.8 g/day, but contineud sxs.  No prior response to Apriso or Entocort. Uceris decreases symptoms, but no durable relief.  Had started prior authorization for  Humira with plan to initiate.  Humira unaffordable to him.  Did not receive assistance through [place].  He then ran out of his mesalamine, and was using budesonide alone. Unfortunately, started having Crohn's flare 2 weeks ago.  Started  himself on prednisone 40 mg/day with improvement in 2 days. Took for 1 week, then changed to 30 mg x1 week and noticed breakthrough when down to 20 mg/day.  Today, back to having sxs again; increased stool frequency, loose stools at 20 mg/day.  No new labs or abdominal imaging for review today.  Review of systems:     No chest pain, no SOB, no fevers, no urinary sx   Past Medical History:  Diagnosis Date   Crohn's disease ([place])    Depression    Dyspnea on exertion    GERD (gastroesophageal reflux disease)    Herpes    History of kidney stones    Hyperlipidemia    Hypertension    Instability of knee joint    Left displaced femoral neck fracture ([place]) 07/11/2017   Snoring     Patient's surgical history, family medical history, social history, medications and allergies were all reviewed in Epic    Current Outpatient Medications  Medication Sig Dispense Refill   amLODipine (NORVASC) 5 MG tablet Take 2 tablets (10 mg total) by mouth daily. 180 tablet 3   aspirin EC 81 MG tablet Take 1 tablet (81 mg total) by mouth daily. Swallow whole. 90 tablet 3   ezetimibe (ZETIA) 10 MG tablet Take 1 tablet (10 mg total) by mouth daily. 90 tablet 0   loperamide (IMODIUM) 2 MG capsule Take 2 mg by mouth as needed for diarrhea or loose stools.     mupirocin ointment (BACTROBAN) 2 % Apply to affected area twice daily for 7 days. Repeat as needed 15 g 0   olmesartan (BENICAR) 20 MG tablet TAKE 1 TABLET BY MOUTH TWICE A DAY- INS PAYS FOR 1 PER DAY (Patient taking differently: Take 20 mg by mouth daily.) 90 tablet 3   omeprazole (PRILOSEC) 40 MG capsule Take 1 capsule (40 mg total) by mouth in the morning and at bedtime. 180 capsule 3   venlafaxine (EFFEXOR) 37.5 MG tablet TAKE 1 TABLET BY MOUTH 2 TIMES DAILY. 180 tablet 1   mesalamine (LIALDA) 1.2 g EC tablet TAKE 4 TABLETS BY MOUTH DAILY WITH BREAKFAST. (Patient not taking: Reported on 02/28/2022) 360 tablet 1   Current Facility-Administered Medications   Medication Dose Route Frequency Provider Last Rate Last Admin   0.9 %  sodium chloride infusion  500 mL Intravenous Once Tyler Tsou V, DO        Physical Exam:     BP 122/80    Pulse 94    Ht _0  (1.753 m)    Wt 180 lb 8 oz (81.9 kg)    SpO2 97%    BMI 26.66 kg/m   GENERAL:  Pleasant male in NAD PSYCH: : Cooperative, normal affect Musculoskeletal:  Normal muscle tone, normal strength NEURO: Alert and oriented x 3, no focal neurologic deficits   IMPRESSION and PLAN:    1) Chronic Colitis 66 year old male with longstanding history of Crohn's Colitis. Previously in clinical remission with Lialda 3.6 g/day, but disease course has continued to progress and no longer controlled with mesalamine agents.  Has continued to have symptom progression with corresponding elevation in fecal calprotectin (ESR/CRP normal even in flares for him).  No response to Apriso or Entocort.  Uceris helped decrease  symptoms, but no durable relief.  Steroid responsive disease, but breakthrough at 20 mg/day.  Humira was not affordable for him.  Discussed alternative medication options at length today and ultimately decided to proceed as below:  - Continue prednisone at 30 mg/day with very slow taper, decreasing by 5 mg every 2 weeks - Ordered Avsola (infliximab biosimilar) today.  Hopefully this can be cost effective for him - Colonoscopy 6-12 months after initiating Avsola - DEXA scan 6 months after steroids - Completed hepatitis A/B vaccine series as ordered - Check micronutrient panel at follow-up - To obtain Prevnar 20 and zoster vaccine with PCM (not available here) - Can likely titrate off Lialda once Avsola is at therapeutic level - Check infliximab antibody and trough levels prior to fourth or fifth dose  2) CKD - Continue routine BMP checks while on 5-ASA therapy  RTC in 3 months or sooner as needed  I spent 30 minutes of time, including in depth chart review, independent review of results as  outlined above, communicating results with the patient directly, face-to-face time with the patient, coordinating care, and ordering studies and medications as appropriate, and documentation.       Tyler Gates ,DO, FACG 02/28/2022, 10:19 AM

## 2022-03-01 ENCOUNTER — Encounter: Payer: Self-pay | Admitting: Gastroenterology

## 2022-03-01 ENCOUNTER — Telehealth: Payer: Self-pay | Admitting: Pharmacy Technician

## 2022-03-01 NOTE — Telephone Encounter (Signed)
Auth Submission: Pending ?Payer: [employer] ?Medication & CPT/J Code(s) submitted: Avsola (infliximab-axxq) [id number] ?Route of submission (phone, fax, portal): phone: ?[phone] ?[phone] ?Auth type: Buy/Bill ?Units/visits requested: 4m/kg ?Reference number:  ?Will update once we receive a response. ? ?  ?

## 2022-03-04 ENCOUNTER — Other Ambulatory Visit: Payer: Self-pay | Admitting: Pharmacy Technician

## 2022-03-04 NOTE — Telephone Encounter (Signed)
Dr. Bryan Lemma, ? ?Avsola has been denied due to patient has not tried and or failed preferred medication Remicade. ?Would you like to try Remicade??? ?Please advise.

## 2022-03-07 ENCOUNTER — Encounter: Payer: Self-pay | Admitting: Family Medicine

## 2022-03-07 NOTE — Telephone Encounter (Signed)
Auth Submission: approved ?Payer: [employer]IC[employer] ?Medication & CPT/J Code(s) submitted: Remicade (Infliximab) J1745 ?Route of submission (phone, fax, portal): PHONE ?[phone] ?Auth type: Buy/Bill ?Units/visits requested: 5MG/KG ?Reference number: EOC - [id number] ?Approval from: 03/01/22 to 12/22/22  ?Patient will be scheduled as soon as possible

## 2022-03-12 ENCOUNTER — Other Ambulatory Visit: Payer: Self-pay

## 2022-03-12 ENCOUNTER — Ambulatory Visit (INDEPENDENT_AMBULATORY_CARE_PROVIDER_SITE_OTHER): Payer: Medicare HMO | Admitting: Family Medicine

## 2022-03-12 ENCOUNTER — Encounter: Payer: Self-pay | Admitting: Family Medicine

## 2022-03-12 VITALS — BP 139/80 | HR 83 | Ht 69.0 in | Wt 180.0 lb

## 2022-03-12 DIAGNOSIS — J329 Chronic sinusitis, unspecified: Secondary | ICD-10-CM | POA: Insufficient documentation

## 2022-03-12 DIAGNOSIS — J320 Chronic maxillary sinusitis: Secondary | ICD-10-CM | POA: Diagnosis not present

## 2022-03-12 DIAGNOSIS — H539 Unspecified visual disturbance: Secondary | ICD-10-CM

## 2022-03-12 MED ORDER — DOXYCYCLINE HYCLATE 100 MG PO TABS: 100.0000 mg | ORAL_TABLET | Freq: Two times a day (BID) | ORAL | 0 refills | Status: AC

## 2022-03-12 NOTE — Progress Notes (Signed)
?Tyler Gates - 66 y.o. male MRN [id number]  Date of birth: [birth year] ? ?Subjective ?Chief Complaint  ?Patient presents with  ? Eye Problem  ? ? ?HPI ?Tyler Gates is a 66 year old male here today with complaint of sinus pressure with increased mucus.  Pain and pressure located behind the left sinus and eye area.  He has had this for a few months with worsening over the past couple of weeks.  He denies fever, chills, nausea or dizziness.  Denies ear pain. ? ?Additionally he is noted visual disturbance in the left peripheral vision, notices what he calls a "gray area". ? ?ROS:  A comprehensive ROS was completed and negative except as noted per HPI ? ?Allergies  ?Allergen Reactions  ? Sulfasalazine Other (See Comments)  ?  Flu like feelings  ? Statins Other (See Comments)  ?  REACTION: Myalgia  ? ? ?Past Medical History:  ?Diagnosis Date  ? Crohn's disease ([place])   ? Depression   ? Dyspnea on exertion   ? GERD (gastroesophageal reflux disease)   ? Herpes   ? History of kidney stones   ? Hyperlipidemia   ? Hypertension   ? Instability of knee joint   ? Left displaced femoral neck fracture ([place]) 07/11/2017  ? Snoring   ? ? ?Past Surgical History:  ?Procedure Laterality Date  ? COLONOSCOPY  2007  ? Dr. Sabino Donovan normal TI, ascending colon/cecum with mild inflammation noted but biopses without signs of active inflammation, hyperpastic polyp, possible ischemia on path of left colon biopsy  ? COLONOSCOPY N/A 11/08/2015  ? Procedure: COLONOSCOPY;  Surgeon: [name], MD;  Location: [facility];  Service: Endoscopy;  Laterality: N/A;  ? ESOPHAGOGASTRODUODENOSCOPY  2007  ? Dr. Sabino Donovan reflux esophagitis, hiatal hernia, negative H.pylori  ? ESOPHAGOGASTRODUODENOSCOPY N/A 11/08/2015  ? Procedure: ESOPHAGOGASTRODUODENOSCOPY (EGD);  Surgeon: [name], MD;  Location: [facility];  Service: Endoscopy;  Laterality: N/A;  0830 - moved to 8:15 - office to notify  ? HIP PINNING,CANNULATED Left 07/12/2017  ? Procedure: CANNULATED HIP  PINNING LEFT;  Surgeon: Rod Can, MD;  Location: [facility];  Service: Orthopedics;  Laterality: Left;  ? ? ?Social History  ? ?Socioeconomic History  ? Marital status: Married  ?  Spouse name: Not on file  ? Number of children: 2  ? Years of education: Not on file  ? Highest education level: Not on file  ?Occupational History  ? Occupation: Retired  ?Tobacco Use  ? Smoking status: Never  ? Smokeless tobacco: Never  ?Vaping Use  ? Vaping Use: Never used  ?Substance and Sexual Activity  ? Alcohol use: Yes  ?  Alcohol/week: 0.0 standard drinks  ?  Comment: glass a wine once a week  ? Drug use: No  ? Sexual activity: Not Currently  ?  Birth control/protection: None  ?Other Topics Concern  ? Not on file  ?Social History Narrative  ? Not on file  ? ?Social Determinants of Health  ? ?Financial Resource Strain: Not on file  ?Food Insecurity: Not on file  ?Transportation Needs: Not on file  ?Physical Activity: Not on file  ?Stress: Not on file  ?Social Connections: Not on file  ? ? ?Family History  ?Problem Relation Age of Onset  ? Melanoma Father   ? Heart disease Father   ? Healthy Mother   ? Colon cancer Neg Hx   ? Esophageal cancer Neg Hx   ? ? ?Health Maintenance  ?Topic Date Due  ?  COVID-19 Vaccine (3 - Pfizer risk series) 09/22/2022 (Originally 05/22/2020)  ? Zoster Vaccines- Shingrix (1 of 2) 09/22/2022 (Originally 11/17/1974)  ? Pneumonia Vaccine 71+ Years old (1 - PCV) 03/13/2023 (Originally 11/17/2020)  ? TETANUS/TDAP  10/22/2027  ? COLONOSCOPY (Pts 45-30yr Insurance coverage will need to be confirmed)  05/24/2030  ? INFLUENZA VACCINE  Completed  ? Hepatitis C Screening  Completed  ? HPV VACCINES  Aged Out  ? ? ? ?----------------------------------------------------------------------------------------------------------------------------------------------------------------------------------------------------------------- ?Physical Exam ?BP 139/80 (BP Location: Left Arm, Patient Position: Sitting, Cuff Size:  Normal)   Pulse 83   Ht 5' 9"  (1.753 m)   Wt 180 lb (81.6 kg)   SpO2 96%   BMI 26.58 kg/m?  ? ?Physical Exam ?Constitutional:   ?   Appearance: Normal appearance.  ?HENT:  ?   Head: Normocephalic and atraumatic.  ?   Mouth/Throat:  ?   Comments: Left maxillary sinus tenderness. ?Eyes:  ?   General: No scleral icterus. ?Cardiovascular:  ?   Rate and Rhythm: Normal rate and regular rhythm.  ?Pulmonary:  ?   Effort: Pulmonary effort is normal.  ?   Breath sounds: Normal breath sounds.  ?Musculoskeletal:  ?   Cervical back: Neck supple.  ?Neurological:  ?   Mental Status: He is alert.  ?Psychiatric:     ?   Mood and Affect: Mood normal.     ?   Behavior: Behavior normal.  ? ? ?------------------------------------------------------------------------------------------------------------------------------------------------------------------------------------------------------------------- ?Assessment and Plan ? ?Chronic sinusitis ?Acute on chronic sinusitis.  Treating with 2-week course of doxycycline.  Discussed that if symptoms are not improving we will plan to get CT scan of the sinuses. ? ?Visual disturbance ?Referral placed to ophthalmology. ? ? ?Meds ordered this encounter  ?Medications  ? doxycycline (VIBRA-TABS) 100 MG tablet  ?  Sig: Take 1 tablet (100 mg total) by mouth 2 (two) times daily for 14 days.  ?  Dispense:  28 tablet  ?  Refill:  0  ? ? ?No follow-ups on file. ? ? ? ?This visit occurred during the SARS-CoV-2 public health emergency.  Safety protocols were in place, including screening questions prior to the visit, additional usage of staff PPE, and extensive cleaning of exam room while observing appropriate contact time as indicated for disinfecting solutions.  ? ?

## 2022-03-12 NOTE — Assessment & Plan Note (Signed)
Acute on chronic sinusitis.  Treating with 2-week course of doxycycline.  Discussed that if symptoms are not improving we will plan to get CT scan of the sinuses. ?

## 2022-03-12 NOTE — Patient Instructions (Signed)
Let's try course of doxycycline.  ?If not improving with this let's get a CT scan of your sinuses.  ?I have also placed a referral to ophthalmology.  ? ? ?Sinusitis, Adult ?Sinusitis is inflammation of your sinuses. Sinuses are hollow spaces in the bones around your face. Your sinuses are located: ?Around your eyes. ?In the middle of your forehead. ?Behind your nose. ?In your cheekbones. ?Mucus normally drains out of your sinuses. When your nasal tissues become inflamed or swollen, mucus can become trapped or blocked. This allows bacteria, viruses, and fungi to grow, which leads to infection. Most infections of the sinuses are caused by a virus. ?Sinusitis can develop quickly. It can last for up to 4 weeks (acute) or for more than 12 weeks (chronic). Sinusitis often develops after a cold. ?What are the causes? ?This condition is caused by anything that creates swelling in the sinuses or stops mucus from draining. This includes: ?Allergies. ?Asthma. ?Infection from bacteria or viruses. ?Deformities or blockages in your nose or sinuses. ?Abnormal growths in the nose (nasal polyps). ?Pollutants, such as chemicals or irritants in the air. ?Infection from fungi (rare). ?What increases the risk? ?You are more likely to develop this condition if you: ?Have a weak body defense system (immune system). ?Do a lot of swimming or diving. ?Overuse nasal sprays. ?Smoke. ?What are the signs or symptoms? ?The main symptoms of this condition are pain and a feeling of pressure around the affected sinuses. Other symptoms include: ?Stuffy nose or congestion. ?Thick drainage from your nose. ?Swelling and warmth over the affected sinuses. ?Headache. ?Upper toothache. ?A cough that may get worse at night. ?Extra mucus that collects in the throat or the back of the nose (postnasal drip). ?Decreased sense of smell and taste. ?Fatigue. ?A fever. ?Sore throat. ?Bad breath. ?How is this diagnosed? ?This condition is diagnosed based on: ?Your  symptoms. ?Your medical history. ?A physical exam. ?Tests to find out if your condition is acute or chronic. This may include: ?Checking your nose for nasal polyps. ?Viewing your sinuses using a device that has a light (endoscope). ?Testing for allergies or bacteria. ?Imaging tests, such as an MRI or CT scan. ?In rare cases, a bone biopsy may be done to rule out more serious types of fungal sinus disease. ?How is this treated? ?Treatment for sinusitis depends on the cause and whether your condition is chronic or acute. ?If caused by a virus, your symptoms should go away on their own within 10 days. You may be given medicines to relieve symptoms. They include: ?Medicines that shrink swollen nasal passages (topical intranasal decongestants). ?Medicines that treat allergies (antihistamines). ?A spray that eases inflammation of the nostrils (topical intranasal corticosteroids). ?Rinses that help get rid of thick mucus in your nose (nasal saline washes). ?If caused by bacteria, your health care provider may recommend waiting to see if your symptoms improve. Most bacterial infections will get better without antibiotic medicine. You may be given antibiotics if you have: ?A severe infection. ?A weak immune system. ?If caused by narrow nasal passages or nasal polyps, you may need to have surgery. ?Follow these instructions at home: ?Medicines ?Take, use, or apply over-the-counter and prescription medicines only as told by your health care provider. These may include nasal sprays. ?If you were prescribed an antibiotic medicine, take it as told by your health care provider. Do not stop taking the antibiotic even if you start to feel better. ?Hydrate and humidify ? ?Drink enough fluid to keep your urine pale  yellow. Staying hydrated will help to thin your mucus. ?Use a cool mist humidifier to keep the humidity level in your home above 50%. ?Inhale steam for 10-15 minutes, 3-4 times a day, or as told by your health care  provider. You can do this in the bathroom while a hot shower is running. ?Limit your exposure to cool or dry air. ?Rest ?Rest as much as possible. ?Sleep with your head raised (elevated). ?Make sure you get enough sleep each night. ?General instructions ? ?Apply a warm, moist washcloth to your face 3-4 times a day or as told by your health care provider. This will help with discomfort. ?Wash your hands often with soap and water to reduce your exposure to germs. If soap and water are not available, use hand sanitizer. ?Do not smoke. Avoid being around people who are smoking (secondhand smoke). ?Keep all follow-up visits as told by your health care provider. This is important. ?Contact a health care provider if: ?You have a fever. ?Your symptoms get worse. ?Your symptoms do not improve within 10 days. ?Get help right away if: ?You have a severe headache. ?You have persistent vomiting. ?You have severe pain or swelling around your face or eyes. ?You have vision problems. ?You develop confusion. ?Your neck is stiff. ?You have trouble breathing. ?Summary ?Sinusitis is soreness and inflammation of your sinuses. Sinuses are hollow spaces in the bones around your face. ?This condition is caused by nasal tissues that become inflamed or swollen. The swelling traps or blocks the flow of mucus. This allows bacteria, viruses, and fungi to grow, which leads to infection. ?If you were prescribed an antibiotic medicine, take it as told by your health care provider. Do not stop taking the antibiotic even if you start to feel better. ?Keep all follow-up visits as told by your health care provider. This is important. ?This information is not intended to replace advice given to you by your health care provider. Make sure you discuss any questions you have with your health care provider. ?Document Revised: 05/11/2018 Document Reviewed: 05/11/2018 ?Elsevier Patient Education ? [place]. ? ?

## 2022-03-12 NOTE — Assessment & Plan Note (Signed)
Referral placed to ophthalmology. ?

## 2022-03-19 DIAGNOSIS — H524 Presbyopia: Secondary | ICD-10-CM | POA: Diagnosis not present

## 2022-03-27 ENCOUNTER — Telehealth: Payer: Self-pay | Admitting: Pharmacy Technician

## 2022-03-27 ENCOUNTER — Ambulatory Visit (INDEPENDENT_AMBULATORY_CARE_PROVIDER_SITE_OTHER): Payer: Medicare HMO | Admitting: Medical-Surgical

## 2022-03-27 DIAGNOSIS — Z0000 Encounter for general adult medical examination without abnormal findings: Secondary | ICD-10-CM | POA: Diagnosis not present

## 2022-03-27 NOTE — Patient Instructions (Addendum)
?MEDICARE ANNUAL WELLNESS VISIT ?Health Maintenance Summary and Written Plan of Care ? ?[name] , ? ?Thank you for allowing me to perform your Medicare Annual Wellness Visit and for your ongoing commitment to your health.  ? ?Health Maintenance & Immunization History ?Health Maintenance  ?Topic Date Due  ?? COVID-19 Vaccine (3 - Pfizer risk series) 09/22/2022 (Originally 05/22/2020)  ?? Zoster Vaccines- Shingrix (1 of 2) 09/22/2022 (Originally 11/17/1975)  ?? Pneumonia Vaccine 99+ Years old (1 - PCV) 03/13/2023 (Originally 11/17/2020)  ?? INFLUENZA VACCINE  07/23/2022  ?? TETANUS/TDAP  10/22/2027  ?? COLONOSCOPY (Pts 45-8yr Insurance coverage will need to be confirmed)  05/24/2030  ?? Hepatitis C Screening  Completed  ?? HPV VACCINES  Aged Out  ? ?Immunization History  ?Administered Date(s) Administered  ?? Hepatitis A, Adult 01/24/2022  ?? Hepb-cpg 01/24/2022, 02/21/2022  ?? Influenza Whole 10/07/2008  ?? Influenza, High Dose Seasonal PF 10/15/2021  ?? Influenza,inj,Quad PF,6+ Mos 10/21/2018, 09/02/2019, 11/08/2020  ?? PFIZER(Purple Top)SARS-COV-2 Vaccination 04/01/2020, 04/24/2020  ? ? ?These are the patient goals that we discussed: ? Goals Addressed   ?  ?  ?  ?  ?  ? This Visit's Progress  ? ?  Patient Stated (pt-stated)     ?   He has been having some knee issues and would love to resolve that quickly to help maintain his current health. ?  ?  ?  ? ?This is a list of Health Maintenance Items that are overdue or due now: ?Pneumococcal vaccine  ?Shingrix vaccine ? ?Orders/Referrals Placed Today: ?No orders of the defined types were placed in this encounter. ? ?(Contact our referral department at [phone] you have not spoken with someone about your referral appointment within the next 5 days)  ? ? ?Follow-up Plan ?Follow-up with MLuetta Nutting DO as planned ?Schedule your pneumonia vaccine and your Shingrix vaccine at your pharmacy. ?Medicare wellness visit in one year.  ?Patient will access AVS on my  chart. ? ? ? ?  ?Health Maintenance, Male ?Adopting a healthy lifestyle and getting preventive care are important in promoting health and wellness. Ask your health care provider about: ?The right schedule for you to have regular tests and exams. ?Things you can do on your own to prevent diseases and keep yourself healthy. ?What should I know about diet, weight, and exercise? ?Eat a healthy diet ? ?Eat a diet that includes plenty of vegetables, fruits, low-fat dairy products, and lean protein. ?Do not eat a lot of foods that are high in solid fats, added sugars, or sodium. ?Maintain a healthy weight ?Body mass index (BMI) is a measurement that can be used to identify possible weight problems. It estimates body fat based on height and weight. Your health care provider can help determine your BMI and help you achieve or maintain a healthy weight. ?Get regular exercise ?Get regular exercise. This is one of the most important things you can do for your health. Most adults should: ?Exercise for at least 150 minutes each week. The exercise should increase your heart rate and make you sweat (moderate-intensity exercise). ?Do strengthening exercises at least twice a week. This is in addition to the moderate-intensity exercise. ?Spend less time sitting. Even light physical activity can be beneficial. ?Watch cholesterol and blood lipids ?Have your blood tested for lipids and cholesterol at 67years of age, then have this test every 5 years. ?You may need to have your cholesterol levels checked more often if: ?Your lipid or cholesterol levels are high. ?  You are older than 67 years of age. ?You are at high risk for heart disease. ?What should I know about cancer screening? ?Many types of cancers can be detected early and may often be prevented. Depending on your health history and family history, you may need to have cancer screening at various ages. This may include screening for: ?Colorectal cancer. ?Prostate cancer. ?Skin  cancer. ?Lung cancer. ?What should I know about heart disease, diabetes, and high blood pressure? ?Blood pressure and heart disease ?High blood pressure causes heart disease and increases the risk of stroke. This is more likely to develop in people who have high blood pressure readings or are overweight. ?Talk with your health care provider about your target blood pressure readings. ?Have your blood pressure checked: ?Every 3-5 years if you are 52-69 years of age. ?Every year if you are 61 years old or older. ?If you are between the ages of 56 and 40 and are a current or former smoker, ask your health care provider if you should have a one-time screening for abdominal aortic aneurysm (AAA). ?Diabetes ?Have regular diabetes screenings. This checks your fasting blood sugar level. Have the screening done: ?Once every three years after age 46 if you are at a normal weight and have a low risk for diabetes. ?More often and at a younger age if you are overweight or have a high risk for diabetes. ?What should I know about preventing infection? ?Hepatitis B ?If you have a higher risk for hepatitis B, you should be screened for this virus. Talk with your health care provider to find out if you are at risk for hepatitis B infection. ?Hepatitis C ?Blood testing is recommended for: ?Everyone born from [birth year] through 1966. ?Anyone with known risk factors for hepatitis C. ?Sexually transmitted infections (STIs) ?You should be screened each year for STIs, including gonorrhea and chlamydia, if: ?You are sexually active and are younger than 67 years of age. ?You are older than 67 years of age and your health care provider tells you that you are at risk for this type of infection. ?Your sexual activity has changed since you were last screened, and you are at increased risk for chlamydia or gonorrhea. Ask your health care provider if you are at risk. ?Ask your health care provider about whether you are at high risk for HIV. Your health  care provider may recommend a prescription medicine to help prevent HIV infection. If you choose to take medicine to prevent HIV, you should first get tested for HIV. You should then be tested every 3 months for as long as you are taking the medicine. ?Follow these instructions at home: ?Alcohol use ?Do not drink alcohol if your health care provider tells you not to drink. ?If you drink alcohol: ?Limit how much you have to 0-2 drinks a day. ?Know how much alcohol is in your drink. In the [place] one drink equals one 12 oz bottle of beer (355 mL), one 5 oz glass of wine (148 mL), or one 1? oz glass of hard liquor (44 mL). ?Lifestyle ?Do not use any products that contain nicotine or tobacco. These products include cigarettes, chewing tobacco, and vaping devices, such as e-cigarettes. If you need help quitting, ask your health care provider. ?Do not use street drugs. ?Do not share needles. ?Ask your health care provider for help if you need support or information about quitting drugs. ?General instructions ?Schedule regular health, dental, and eye exams. ?Stay current with your vaccines. ?Tell your health  care provider if: ?You often feel depressed. ?You have ever been abused or do not feel safe at home. ?Summary ?Adopting a healthy lifestyle and getting preventive care are important in promoting health and wellness. ?Follow your health care provider's instructions about healthy diet, exercising, and getting tested or screened for diseases. ?Follow your health care provider's instructions on monitoring your cholesterol and blood pressure. ?This information is not intended to replace advice given to you by your health care provider. Make sure you discuss any questions you have with your health care provider. ?Document Revised: 04/30/2021 Document Reviewed: 04/30/2021 ?Elsevier Patient Education ? [place]. ? ?

## 2022-03-27 NOTE — Progress Notes (Signed)
? ? ?[facility] VISIT ? ?03/27/2022 ? ?Telephone Visit Disclaimer ?This Medicare AWV was conducted by telephone due to national recommendations for restrictions regarding the COVID-19 Pandemic (e.g. social distancing).  I verified, using two identifiers, that I am speaking with Tyler Gates or their authorized healthcare agent. I discussed the limitations, risks, security, and privacy concerns of performing an evaluation and management service by telephone and the potential availability of an in-person appointment in the future. The patient expressed understanding and agreed to proceed.  ?Location of Patient: Home ?Location of Provider (nurse):  provider home ? ?Subjective:  ? ? ?Tyler Gates is a 66 y.o. male patient of Tyler Nutting, DO who had a Medicare Annual Wellness Visit today via telephone. Tyler Gates is Retired and lives with their spouse. he has 2 children. he reports that he is socially active and does interact with friends/family regularly. he is moderately physically active and enjoys cycling, kayaking and working on model cars. ? ?Patient Care Team: ?Tyler Nutting, DO as PCP - General (Family Medicine) ?Pixie Casino, MD as PCP - Cardiology (Cardiology) ?Rourk, Cristopher Estimable, MD as Attending Physician (Gastroenterology) ? ? ?  03/27/2022  ? 10:04 AM 10/21/2018  ? 11:40 AM 07/11/2017  ?  3:49 PM 07/11/2017  ?  2:07 PM 11/08/2015  ?  7:38 AM  ?Advanced Directives  ?Does Patient Have a Medical Advance Directive? Yes No No No No  ?Type of Advance Directive Living will      ?Does patient want to make changes to medical advance directive? No - Patient declined      ?Would patient like information on creating a medical advance directive?  No - Patient declined No - Patient declined  Yes - Educational materials given  ? ? ?Hospital Utilization Over the Past 12 Months: ?# of hospitalizations or ER visits: 0 ?# of surgeries: 0 ? ?Review of Systems    ?Patient reports that his overall health is  unchanged compared to last year. ? ?History obtained from chart review and the patient ? ?Patient Reported Readings (BP, Pulse, CBG, Weight, etc) ?none ? ?Pain Assessment ?Pain : 0-10 ?Pain Score: 6  ?Pain Type: Acute pain ?Pain Location: Knee ?Pain Orientation: Right ?Pain Descriptors / Indicators: Constant ?Pain Onset: More than a month ago ?Pain Frequency: Intermittent ?Pain Relieving Factors: Ibuprofen ?Effect of Pain on Daily Activities: Pain is worse with activity. ? ?Pain Relieving Factors: Ibuprofen ? ?Current Medications & Allergies (verified) ?Allergies as of 03/27/2022   ? ?   Reactions  ? Sulfasalazine Other (See Comments)  ? Flu like feelings  ? Statins Other (See Comments)  ? REACTION: Myalgia  ? ?  ? ?  ?Medication List  ?  ? ?  ? Accurate as of March 27, 2022 10:27 AM. If you have any questions, ask your nurse or doctor.  ?  ?  ? ?  ? ?AMBULATORY NON FORMULARY MEDICATION ?Medication Name: Prednisone 10 mg tablet ?Take 3 tablets (30 mg total) by mouth daily for 30 days, THEN 2.5 tablets (25 mg total) daily for 14 days, THEN 2 tablets (20 mg total) daily for 14 days, THEN 1.5 tablets (15 mg total) daily for 14 days, THEN 1 tablet (10 mg total) daily for 14 days, THEN 0.5 tablets (5 mg total) daily for 14 days ?  ?amLODipine 5 MG tablet ?Commonly known as: NORVASC ?Take 2 tablets (10 mg total) by mouth daily. ?  ?aspirin EC 81 MG tablet ?Take 1 tablet (81 mg  total) by mouth daily. Swallow whole. ?  ?ezetimibe 10 MG tablet ?Commonly known as: ZETIA ?Take 1 tablet (10 mg total) by mouth daily. ?  ?loperamide 2 MG capsule ?Commonly known as: IMODIUM ?Take 2 mg by mouth as needed for diarrhea or loose stools. ?  ?mesalamine 1.2 g EC tablet ?Commonly known as: LIALDA ?TAKE 4 TABLETS BY MOUTH DAILY WITH BREAKFAST. ?  ?mupirocin ointment 2 % ?Commonly known as: BACTROBAN ?Apply to affected area twice daily for 7 days. Repeat as needed ?  ?olmesartan 20 MG tablet ?Commonly known as: BENICAR ?TAKE 1 TABLET BY MOUTH  TWICE A DAY- INS PAYS FOR 1 PER DAY ?What changed: See the new instructions. ?  ?omeprazole 40 MG capsule ?Commonly known as: PRILOSEC ?Take 1 capsule (40 mg total) by mouth in the morning and at bedtime. ?  ?predniSONE 10 MG tablet ?Commonly known as: DELTASONE ?Take 3 tablets (30 mg total) by mouth daily for 30 days, THEN 2.5 tablets (25 mg total) daily for 14 days, THEN 2 tablets (20 mg total) daily for 14 days, THEN 1.5 tablets (15 mg total) daily for 14 days, THEN 1 tablet (10 mg total) daily for 14 days, THEN 0.5 tablets (5 mg total) daily for 14 days. ?Start taking on: February 28, 2022 ?  ?predniSONE 10 MG tablet ?Commonly known as: DELTASONE ?Take 3 tablets (30 mg total) by mouth as directed. Take 3 tablets (30 mg total) by mouth daily for 31 days, THEN 2.5 tablets (25 mg total) daily for 14 days, THEN 2 tablets (20 mg total) daily for 14 days, THEN 1.5 tablets (15 mg total) daily for 14 days, THEN 1 tablet (10 mg total) daily for 14 days, THEN 0.5 tablets (5 mg total) daily for 14 days. ?  ?venlafaxine 37.5 MG tablet ?Commonly known as: EFFEXOR ?TAKE 1 TABLET BY MOUTH 2 TIMES DAILY. ?  ? ?  ? ? ?History (reviewed): ?Past Medical History:  ?Diagnosis Date  ? Crohn's disease ([place])   ? Depression   ? Dyspnea on exertion   ? GERD (gastroesophageal reflux disease)   ? Herpes   ? History of kidney stones   ? Hyperlipidemia   ? Hypertension   ? Instability of knee joint   ? Left displaced femoral neck fracture ([place]) 07/11/2017  ? Snoring   ? ?Past Surgical History:  ?Procedure Laterality Date  ? COLONOSCOPY  2007  ? Dr. Sabino Donovan normal TI, ascending colon/cecum with mild inflammation noted but biopses without signs of active inflammation, hyperpastic polyp, possible ischemia on path of left colon biopsy  ? COLONOSCOPY N/A 11/08/2015  ? Procedure: COLONOSCOPY;  Surgeon: [name], MD;  Location: [facility];  Service: Endoscopy;  Laterality: N/A;  ? ESOPHAGOGASTRODUODENOSCOPY  2007  ? Dr. Sabino Donovan reflux esophagitis,  hiatal hernia, negative H.pylori  ? ESOPHAGOGASTRODUODENOSCOPY N/A 11/08/2015  ? Procedure: ESOPHAGOGASTRODUODENOSCOPY (EGD);  Surgeon: [name], MD;  Location: [facility];  Service: Endoscopy;  Laterality: N/A;  0830 - moved to 8:15 - office to notify  ? HIP PINNING,CANNULATED Left 07/12/2017  ? Procedure: CANNULATED HIP PINNING LEFT;  Surgeon: Rod Can, MD;  Location: [facility];  Service: Orthopedics;  Laterality: Left;  ? ?Family History  ?Problem Relation Age of Onset  ? Melanoma Father   ? Heart disease Father   ? Healthy Mother   ? Colon cancer Neg Hx   ? Esophageal cancer Neg Hx   ? ?Social History  ? ?Socioeconomic History  ? Marital status:  Married  ?  Spouse name: Renaldo Gornick  ? Number of children: 2  ? Years of education: 33  ? Highest education level: Some college, no degree  ?Occupational History  ? Occupation: Retired  ?Tobacco Use  ? Smoking status: Never  ? Smokeless tobacco: Never  ?Vaping Use  ? Vaping Use: Never used  ?Substance and Sexual Activity  ? Alcohol use: Not Currently  ? Drug use: No  ? Sexual activity: Not Currently  ?  Birth control/protection: None  ?Other Topics Concern  ? Not on file  ?Social History Narrative  ? Lives with wife. He has two children. He enjoys cycling, kayaking and working on model cars.  ? ?Social Determinants of Health  ? ?Financial Resource Strain: Low Risk   ? Difficulty of Paying Living Expenses: Not hard at all  ?Food Insecurity: No Food Insecurity  ? Worried About Charity fundraiser in the Last Year: Never true  ? Ran Out of Food in the Last Year: Never true  ?Transportation Needs: No Transportation Needs  ? Lack of Transportation (Medical): No  ? Lack of Transportation (Non-Medical): No  ?Physical Activity: Sufficiently Active  ? Days of Exercise per Week: 3 days  ? Minutes of Exercise per Session: 60 min  ?Stress: No Stress Concern Present  ? Feeling of Stress : Only a little  ?Social Connections: Socially Isolated  ? Frequency of Communication  with Friends and Family: Once a week  ? Frequency of Social Gatherings with Friends and Family: Once a week  ? Attends Religious Services: Never  ? Active Member of Clubs or Organizations: No  ? Attends Club or

## 2022-03-27 NOTE — Telephone Encounter (Addendum)
Forms for PAP has been faxed to MD office:  ?Fax: [phone]. ?Awaiting MD signature. ?Contact person: Mickel Baas RN  ?Phone: [phone] ? ?[facility]AN[facility] PAP. (Free drug) ?Phone: [phone] ?Fax: [phone] ?Received confirmation receipt from [place], enrollment is being processed. ? ?Will f/u with response. ? ?

## 2022-04-02 DIAGNOSIS — H43392 Other vitreous opacities, left eye: Secondary | ICD-10-CM | POA: Diagnosis not present

## 2022-04-02 DIAGNOSIS — H353131 Nonexudative age-related macular degeneration, bilateral, early dry stage: Secondary | ICD-10-CM | POA: Diagnosis not present

## 2022-04-02 DIAGNOSIS — H2513 Age-related nuclear cataract, bilateral: Secondary | ICD-10-CM | POA: Diagnosis not present

## 2022-04-03 ENCOUNTER — Other Ambulatory Visit: Payer: Self-pay | Admitting: Family Medicine

## 2022-04-11 NOTE — Telephone Encounter (Signed)
JAN[facility] REMICADE PAP: approved ?Pt id# [id number] ?Approved: 04/10/22 - 12/22/22 ?Remicade deliver ETA: 04/19/22 ?Patient will be scheduled after 04/19/22 once medication arrives.

## 2022-04-15 ENCOUNTER — Encounter: Payer: Self-pay | Admitting: Gastroenterology

## 2022-04-15 NOTE — Telephone Encounter (Signed)
Very happy to hear that the Remicade payment assistance has been approved and that your symptoms are currently under control during prednisone wean.  No, I do not think it is a good idea to delay infusions.  As the prednisone continue to wean, the inflammation will return in the form of a flare (not if, but when).  There is no guarantee that we can continue controlling flares with recurrent courses of steroids.  I strongly recommend proceeding with Remicade as planned. ?

## 2022-04-22 DIAGNOSIS — M1711 Unilateral primary osteoarthritis, right knee: Secondary | ICD-10-CM | POA: Diagnosis not present

## 2022-04-23 ENCOUNTER — Telehealth: Payer: Self-pay | Admitting: Pharmacy Technician

## 2022-04-23 NOTE — Telephone Encounter (Signed)
Message received from the infusion team.  Patient does not want to start the Remicade as prescribed.  As previously discussed with him, I believe this is the correct medication option for him, but certainly cannot force him to take a medication.  Per patient wishes, he can continue to monitor for symptoms and contact us if/when needed. ?

## 2022-04-23 NOTE — Telephone Encounter (Signed)
Spoke with pt and let him know Dr. Vivia Ewing message. Pt verbalized understanding and stated he would contact the office if symptoms returned.  ?

## 2022-04-23 NOTE — Telephone Encounter (Addendum)
Dr. Baker Janus ? ?Patient states he does not wish to have remicade treatment at this time and he is feeling well. ?Advised patient to speak with his Dr for advise on getting remicade treatment. ? ?Fyi: ?Patient has been approved for Remicade and has been enrolled in the free drug program. ?

## 2022-04-25 NOTE — Progress Notes (Signed)
? ? ? ? ?HPI: FU CAD.  Patient has been previously followed by Dr. Debara Pickett.  Nuclear study August 2019 showed ejection fraction 45% but visually appeared better.  Perfusion was normal.  Calcium score July 2019 410 which was 85th percentile for age and sex matched control.  Since last seen over the past 3 to 6 months patient has noticed occasional pain in his left upper chest radiating to his neck.  He predominately notes this when he is cycling vigorously.  It improves as he slows down.  There is no associated symptoms.  He otherwise denies dyspnea on exertion or syncope. ? ?Current Outpatient Medications  ?Medication Sig Dispense Refill  ? amLODipine (NORVASC) 5 MG tablet Take 2 tablets (10 mg total) by mouth daily. 180 tablet 3  ? aspirin EC 81 MG tablet Take 1 tablet (81 mg total) by mouth daily. Swallow whole. 90 tablet 3  ? ezetimibe (ZETIA) 10 MG tablet TAKE 1 TABLET BY MOUTH EVERY DAY 90 tablet 0  ? inFLIXimab (REMICADE) 100 MG injection Remicade 100 mg intravenous solution ? INFUSE 400 MG IV ON DAY 1, DAY 14, DAY 42 AND EVERY 8 WEEKS THEREAFTER    ? loperamide (IMODIUM) 2 MG capsule Take 2 mg by mouth as needed for diarrhea or loose stools.    ? mupirocin ointment (BACTROBAN) 2 % Apply to affected area twice daily for 7 days. Repeat as needed 15 g 0  ? olmesartan (BENICAR) 20 MG tablet TAKE 1 TABLET BY MOUTH TWICE A DAY- INS PAYS FOR 1 PER DAY (Patient taking differently: Take 20 mg by mouth daily.) 90 tablet 3  ? omeprazole (PRILOSEC) 40 MG capsule TAKE 1 CAPSULE (40 MG TOTAL) BY MOUTH IN THE MORNING AND AT BEDTIME. 180 capsule 3  ? predniSONE (DELTASONE) 10 MG tablet Take 3 tablets (30 mg total) by mouth daily for 30 days, THEN 2.5 tablets (25 mg total) daily for 14 days, THEN 2 tablets (20 mg total) daily for 14 days, THEN 1.5 tablets (15 mg total) daily for 14 days, THEN 1 tablet (10 mg total) daily for 14 days, THEN 0.5 tablets (5 mg total) daily for 14 days. 195 tablet 0  ? venlafaxine (EFFEXOR) 37.5 MG  tablet TAKE 1 TABLET BY MOUTH TWICE A DAY 180 tablet 1  ? AMBULATORY NON FORMULARY MEDICATION Medication Name: Prednisone 10 mg tablet ?Take 3 tablets (30 mg total) by mouth daily for 30 days, THEN 2.5 tablets (25 mg total) daily for 14 days, THEN 2 tablets (20 mg total) daily for 14 days, THEN 1.5 tablets (15 mg total) daily for 14 days, THEN 1 tablet (10 mg total) daily for 14 days, THEN 0.5 tablets (5 mg total) daily for 14 days (Patient not taking: Reported on 05/08/2022) 200 tablet 0  ? mesalamine (LIALDA) 1.2 g EC tablet TAKE 4 TABLETS BY MOUTH DAILY WITH BREAKFAST. (Patient not taking: Reported on 05/08/2022) 360 tablet 1  ? ?Current Facility-Administered Medications  ?Medication Dose Route Frequency Provider Last Rate Last Admin  ? 0.9 %  sodium chloride infusion  500 mL Intravenous Once Cirigliano, Vito V, DO      ? ? ? ?Past Medical History:  ?Diagnosis Date  ? Crohn's disease ([place])   ? Depression   ? Dyspnea on exertion   ? GERD (gastroesophageal reflux disease)   ? Herpes   ? History of kidney stones   ? Hyperlipidemia   ? Hypertension   ? Instability of knee joint   ? Left  displaced femoral neck fracture ([place]) 07/11/2017  ? Snoring   ? ? ?Past Surgical History:  ?Procedure Laterality Date  ? COLONOSCOPY  2007  ? Dr. Sabino Donovan normal TI, ascending colon/cecum with mild inflammation noted but biopses without signs of active inflammation, hyperpastic polyp, possible ischemia on path of left colon biopsy  ? COLONOSCOPY N/A 11/08/2015  ? Procedure: COLONOSCOPY;  Surgeon: [name], MD;  Location: [facility];  Service: Endoscopy;  Laterality: N/A;  ? ESOPHAGOGASTRODUODENOSCOPY  2007  ? Dr. Sabino Donovan reflux esophagitis, hiatal hernia, negative H.pylori  ? ESOPHAGOGASTRODUODENOSCOPY N/A 11/08/2015  ? Procedure: ESOPHAGOGASTRODUODENOSCOPY (EGD);  Surgeon: [name], MD;  Location: [facility];  Service: Endoscopy;  Laterality: N/A;  0830 - moved to 8:15 - office to notify  ? HIP PINNING,CANNULATED Left 07/12/2017   ? Procedure: CANNULATED HIP PINNING LEFT;  Surgeon: Rod Can, MD;  Location: [facility];  Service: Orthopedics;  Laterality: Left;  ? ? ?Social History  ? ?Socioeconomic History  ? Marital status: Married  ?  Spouse name: Marcelus Dubberly  ? Number of children: 2  ? Years of education: 5  ? Highest education level: Some college, no degree  ?Occupational History  ? Occupation: Retired  ?Tobacco Use  ? Smoking status: Never  ? Smokeless tobacco: Never  ?Vaping Use  ? Vaping Use: Never used  ?Substance and Sexual Activity  ? Alcohol use: Not Currently  ? Drug use: No  ? Sexual activity: Not Currently  ?  Birth control/protection: None  ?Other Topics Concern  ? Not on file  ?Social History Narrative  ? Lives with wife. He has two children. He enjoys cycling, kayaking and working on model cars.  ? ?Social Determinants of Health  ? ?Financial Resource Strain: Low Risk   ? Difficulty of Paying Living Expenses: Not hard at all  ?Food Insecurity: No Food Insecurity  ? Worried About Charity fundraiser in the Last Year: Never true  ? Ran Out of Food in the Last Year: Never true  ?Transportation Needs: No Transportation Needs  ? Lack of Transportation (Medical): No  ? Lack of Transportation (Non-Medical): No  ?Physical Activity: Sufficiently Active  ? Days of Exercise per Week: 3 days  ? Minutes of Exercise per Session: 60 min  ?Stress: No Stress Concern Present  ? Feeling of Stress : Only a little  ?Social Connections: Socially Isolated  ? Frequency of Communication with Friends and Family: Once a week  ? Frequency of Social Gatherings with Friends and Family: Once a week  ? Attends Religious Services: Never  ? Active Member of Clubs or Organizations: No  ? Attends Archivist Meetings: Never  ? Marital Status: Married  ?Intimate Partner Violence: Not At Risk  ? Fear of Current or Ex-Partner: No  ? Emotionally Abused: No  ? Physically Abused: No  ? Sexually Abused: No  ? ? ?Family History  ?Problem Relation Age of  Onset  ? Melanoma Father   ? Heart disease Father   ? Healthy Mother   ? Colon cancer Neg Hx   ? Esophageal cancer Neg Hx   ? ? ?ROS: no fevers or chills, productive cough, hemoptysis, dysphasia, odynophagia, melena, hematochezia, dysuria, hematuria, rash, seizure activity, orthopnea, PND, pedal edema, claudication. Remaining systems are negative. ? ?Physical Exam: ?Well-developed well-nourished in no acute distress.  ?Skin is warm and dry.  ?HEENT is normal.  ?Neck is supple.  ?Chest is clear to auscultation with normal expansion.  ?Cardiovascular exam  is regular rate and rhythm.  ?Abdominal exam nontender or distended. No masses palpated. ?Extremities show no edema. ?neuro grossly intact ? ?ECG-normal sinus rhythm with occasional PAC, no ST changes.  RV conduction delay noted.  Personally reviewed ? ?A/P ? ?1 coronary artery disease-based on previous elevation of calcium score.  Previous nuclear study showed no ischemia.  Continue aspirin. ? ?2 hyperlipidemia-last lipid panel October 2022 showed total cholesterol 265, triglycerides 497, LDL 141.  Patient did not tolerate Lipitor previously.  We will continue Zetia.  Add Crestor 20 mg daily.  Check lipids and liver in 8 weeks.  If he does not tolerate Crestor we will need to refer to lipid clinic for consideration of Repatha or Praluent. ? ?3 hypertension-blood pressure is elevated but he follows this at home and it is typically controlled.  Continue present medications and follow. ? ?4 chest pain-symptoms are concerning given history of elevated calcium score.  I will arrange a cardiac CTA to rule out obstructive coronary disease.  We will arrange echocardiogram to assess LV function. ? ?Kirk Ruths, MD ? ? ? ?

## 2022-04-30 DIAGNOSIS — G5761 Lesion of plantar nerve, right lower limb: Secondary | ICD-10-CM | POA: Diagnosis not present

## 2022-04-30 DIAGNOSIS — R29898 Other symptoms and signs involving the musculoskeletal system: Secondary | ICD-10-CM | POA: Diagnosis not present

## 2022-04-30 DIAGNOSIS — M79671 Pain in right foot: Secondary | ICD-10-CM | POA: Diagnosis not present

## 2022-05-02 ENCOUNTER — Encounter: Payer: Self-pay | Admitting: Gastroenterology

## 2022-05-03 NOTE — Telephone Encounter (Signed)
Thank you for the update. Very hopeful to induce and maintain remission with biologic therapy.  ?

## 2022-05-04 ENCOUNTER — Other Ambulatory Visit: Payer: Self-pay | Admitting: Family Medicine

## 2022-05-04 DIAGNOSIS — F3340 Major depressive disorder, recurrent, in remission, unspecified: Secondary | ICD-10-CM

## 2022-05-06 ENCOUNTER — Other Ambulatory Visit: Payer: Self-pay | Admitting: Cardiology

## 2022-05-06 DIAGNOSIS — E785 Hyperlipidemia, unspecified: Secondary | ICD-10-CM

## 2022-05-06 NOTE — Telephone Encounter (Signed)
Patient scheduled for Dr Zigmund Daniel NEXT appt on 06/04/22. AMUCK ?

## 2022-05-06 NOTE — Telephone Encounter (Signed)
Front Desk: Please contact the patient to schedule an office visit with Dr. Zigmund Daniel. Past due since 11/22. Thanks ? ?Dr. Zigmund Daniel: Tyler Gates if Effexor refill is appropriate since he's been seen for acute issues only since 2022. Thanks ?

## 2022-05-07 ENCOUNTER — Ambulatory Visit (INDEPENDENT_AMBULATORY_CARE_PROVIDER_SITE_OTHER): Payer: Medicare HMO | Admitting: *Deleted

## 2022-05-07 VITALS — BP 127/85 | HR 89 | Temp 98.3°F | Resp 18 | Ht 69.0 in | Wt 182.6 lb

## 2022-05-07 DIAGNOSIS — K5010 Crohn's disease of large intestine without complications: Secondary | ICD-10-CM

## 2022-05-07 MED ORDER — HYDROCORTISONE NA SUCCINATE PF 100 MG IJ SOLR
100.0000 mg | Freq: Once | INTRAMUSCULAR | Status: AC
  Administered 2022-05-07: 100 mg via INTRAVENOUS
  Filled 2022-05-07: qty 2

## 2022-05-07 MED ORDER — SODIUM CHLORIDE 0.9 % IV SOLN
5.0000 mg/kg | Freq: Once | INTRAVENOUS | Status: AC
  Administered 2022-05-07: 400 mg via INTRAVENOUS
  Filled 2022-05-07: qty 40

## 2022-05-07 MED ORDER — DIPHENHYDRAMINE HCL 25 MG PO CAPS
25.0000 mg | ORAL_CAPSULE | Freq: Once | ORAL | Status: AC
  Administered 2022-05-07: 25 mg via ORAL
  Filled 2022-05-07: qty 1

## 2022-05-07 MED ORDER — ACETAMINOPHEN 325 MG PO TABS
650.0000 mg | ORAL_TABLET | Freq: Once | ORAL | Status: AC
  Administered 2022-05-07: 650 mg via ORAL
  Filled 2022-05-07: qty 2

## 2022-05-07 NOTE — Progress Notes (Signed)
Diagnosis: Indeterminate Colitis ? ?Provider:  Marshell Garfinkel, MD ? ?Procedure: Infusion ? ?IV Type: Peripheral, IV Location: L Hand ? ?Remicade (Infliximab), Dose: 400 mg ? ?Infusion Start Time: 0375 am ? ?Infusion Stop Time: 13330 pm ? ?Post Infusion IV Care: Observation period completed and Peripheral IV Discontinued ? ?Discharge: Condition: Good, Destination: Home . AVS provided to patient.  ? ?Performed by:  Oren Beckmann, RN  ?  ?

## 2022-05-08 ENCOUNTER — Ambulatory Visit: Payer: Medicare HMO | Admitting: Cardiology

## 2022-05-08 ENCOUNTER — Encounter: Payer: Self-pay | Admitting: Cardiology

## 2022-05-08 VITALS — BP 140/94 | HR 108 | Ht 69.0 in | Wt 183.0 lb

## 2022-05-08 DIAGNOSIS — R072 Precordial pain: Secondary | ICD-10-CM | POA: Diagnosis not present

## 2022-05-08 DIAGNOSIS — E785 Hyperlipidemia, unspecified: Secondary | ICD-10-CM

## 2022-05-08 DIAGNOSIS — I2510 Atherosclerotic heart disease of native coronary artery without angina pectoris: Secondary | ICD-10-CM

## 2022-05-08 MED ORDER — METOPROLOL TARTRATE 100 MG PO TABS: ORAL_TABLET | ORAL | 0 refills | Status: DC

## 2022-05-08 MED ORDER — ROSUVASTATIN CALCIUM 20 MG PO TABS: 20.0000 mg | ORAL_TABLET | Freq: Every day | ORAL | 3 refills | Status: DC

## 2022-05-08 NOTE — Patient Instructions (Signed)
Medication Instructions:  ? ?START ROSUVASTATIN 20 MG ONCE DAILY ? ?*If you need a refill on your cardiac medications before your next appointment, please call your pharmacy* ? ? ?Lab Work: ? ?Your physician recommends that you return for lab work in: [place] ? ?If you have labs (blood work) drawn today and your tests are completely normal, you will receive your results only by: ?MyChart Message (if you have MyChart) OR ?A paper copy in the mail ?If you have any lab test that is abnormal or we need to change your treatment, we will call you to review the results. ? ? ?Testing/Procedures: ? ?Your physician has requested that you have an echocardiogram. Echocardiography is a painless test that uses sound waves to create images of your heart. It provides your doctor with information about the size and shape of your heart and how well your heart?s chambers and valves are working. This procedure takes approximately one hour. There are no restrictions for this procedure. [facility]-1ST FLOOR IMAGING DEPARTMENT ? ? ? ?Your cardiac CT will be scheduled at  ? ?[facility] ?[address] ?[place], [place] [address] ?[phone] ? ? ? ?If scheduled at [facility], please arrive at the [facility] Children's Entrance (Entrance C2) of [facility] 30 minutes prior to test start time. ?You can use the FREE valet parking offered at entrance C (encouraged to control the heart rate for the test)  ?Proceed to the [facility] Radiology Department (first floor) to check-in and test prep. ? ?All radiology patients and guests should use entrance C2 at [facility], accessed from [facility], even though the hospital's physical address listed is [address]. ? ? ? ?If scheduled at [facility], please arrive 15 mins early for check-in and test prep. ? ?Please follow these instructions carefully (unless otherwise directed): ? ?Hold all  erectile dysfunction medications at least 3 days (72 hrs) prior to test. ? ?On the Night Before the Test: ?Be sure to Drink plenty of water. ?Do not consume any caffeinated/decaffeinated beverages or chocolate 12 hours prior to your test. ?Do not take any antihistamines 12 hours prior to your test. ? ? ?On the Day of the Test: ?Drink plenty of water until 1 hour prior to the test. ?Do not eat any food 4 hours prior to the test. ?You may take your regular medications prior to the test.  ?Take metoprolol (Lopressor) 100 MG two hours prior to test. ?HOLD Furosemide/Hydrochlorothiazide morning of the test. ?FEMALES- please wear underwire-free bra if available, avoid dresses & tight clothing ? ?     ?After the Test: ?Drink plenty of water. ?After receiving IV contrast, you may experience a mild flushed feeling. This is normal. ?On occasion, you may experience a mild rash up to 24 hours after the test. This is not dangerous. If this occurs, you can take Benadryl 25 mg and increase your fluid intake. ?If you experience trouble breathing, this can be serious. If it is severe call 911 IMMEDIATELY. If it is mild, please call our office. ?If you take any of these medications: Glipizide/Metformin, Avandament, Glucavance, please do not take 48 hours after completing test unless otherwise instructed. ? ?We will call to schedule your test 2-4 weeks out understanding that some insurance companies will need an authorization prior to the service being performed.  ? ?For non-scheduling related questions, please contact the cardiac imaging nurse navigator should you have any questions/concerns: ?Marchia Bond,  Cardiac Imaging Nurse Navigator ?Gordy Clement, Cardiac Imaging Nurse Navigator ?[facility] ?Direct Office Dial: [phone]  ? ?For scheduling needs, including cancellations and rescheduling, please call [place], [phone].  ? ? ?Follow-Up: ?At [facility], you and your health needs are our  priority.  As part of our continuing mission to provide you with exceptional heart care, we have created designated Provider Care Teams.  These Care Teams include your primary Cardiologist (physician) and Advanced Practice Providers (APPs -  Physician Assistants and Nurse Practitioners) who all work together to provide you with the care you need, when you need it. ? ?We recommend signing up for the patient portal called "MyChart".  Sign up information is provided on this After Visit Summary.  MyChart is used to connect with patients for Virtual Visits (Telemedicine).  Patients are able to view lab/test results, encounter notes, upcoming appointments, etc.  Non-urgent messages can be sent to your provider as well.   ?To learn more about what you can do with MyChart, go to [email or web address].   ? ?Your next appointment:   ?8 week(s) ? ?The format for your next appointment:   ?In Person ? ?Provider:   ?Kirk Ruths, MD  ? ? ? ?Important Information About Sugar ? ? ? ? ?  ?

## 2022-05-15 ENCOUNTER — Ambulatory Visit (HOSPITAL_COMMUNITY)
Admission: RE | Admit: 2022-05-15 | Discharge: 2022-05-15 | Disposition: A | Discharge status: Home / Self Care | Payer: Medicare HMO | Source: Ambulatory Visit | Attending: Cardiology | Admitting: Cardiology

## 2022-05-15 DIAGNOSIS — I2510 Atherosclerotic heart disease of native coronary artery without angina pectoris: Secondary | ICD-10-CM | POA: Insufficient documentation

## 2022-05-15 LAB — ECHOCARDIOGRAM COMPLETE
Area-P 1/2: 1.76 cm2
S' Lateral: 2.7 cm

## 2022-05-15 NOTE — Progress Notes (Signed)
*  PRELIMINARY RESULTS* Echocardiogram 2D Echocardiogram has been performed.  Tyler Gates 05/15/2022, 9:40 AM

## 2022-05-17 ENCOUNTER — Other Ambulatory Visit: Payer: Self-pay | Admitting: Pharmacy Technician

## 2022-05-17 DIAGNOSIS — R072 Precordial pain: Secondary | ICD-10-CM | POA: Diagnosis not present

## 2022-05-18 LAB — BASIC METABOLIC PANEL
BUN/Creatinine Ratio: 13 (ref 10–24)
BUN: 16 mg/dL (ref 8–27)
CO2: 22 mmol/L (ref 20–29)
Calcium: 9.2 mg/dL (ref 8.6–10.2)
Chloride: 101 mmol/L (ref 96–106)
Creatinine, Ser: 1.21 mg/dL (ref 0.76–1.27)
Glucose: 134 mg/dL — ABNORMAL HIGH (ref 70–99)
Potassium: 4.3 mmol/L (ref 3.5–5.2)
Sodium: 139 mmol/L (ref 134–144)
eGFR: 66 mL/min/{1.73_m2} (ref >59)

## 2022-05-21 ENCOUNTER — Ambulatory Visit (INDEPENDENT_AMBULATORY_CARE_PROVIDER_SITE_OTHER): Payer: Medicare HMO

## 2022-05-21 VITALS — BP 132/87 | HR 107 | Temp 98.4°F | Resp 18 | Ht 69.0 in | Wt 185.4 lb

## 2022-05-21 DIAGNOSIS — K5010 Crohn's disease of large intestine without complications: Secondary | ICD-10-CM | POA: Diagnosis not present

## 2022-05-21 MED ORDER — ACETAMINOPHEN 325 MG PO TABS
650.0000 mg | ORAL_TABLET | Freq: Once | ORAL | Status: AC
  Administered 2022-05-21: 650 mg via ORAL
  Filled 2022-05-21 (×2): qty 2

## 2022-05-21 MED ORDER — HYDROCORTISONE NA SUCCINATE PF 100 MG IJ SOLR
100.0000 mg | Freq: Once | INTRAMUSCULAR | Status: AC
  Administered 2022-05-21: 100 mg via INTRAVENOUS
  Filled 2022-05-21: qty 2

## 2022-05-21 MED ORDER — DIPHENHYDRAMINE HCL 25 MG PO CAPS
25.0000 mg | ORAL_CAPSULE | Freq: Once | ORAL | Status: AC
  Administered 2022-05-21: 25 mg via ORAL
  Filled 2022-05-21: qty 1

## 2022-05-21 MED ORDER — SODIUM CHLORIDE 0.9 % IV SOLN
5.0000 mg/kg | Freq: Once | INTRAVENOUS | Status: AC
  Administered 2022-05-21: 400 mg via INTRAVENOUS
  Filled 2022-05-21: qty 40

## 2022-05-21 NOTE — Progress Notes (Signed)
Diagnosis: Crohn's Disease  Provider:  Marshell Garfinkel, MD  Procedure: Infusion  IV Type: Peripheral, IV Location: L Hand  Remicade (Infliximab), Dose: 400  Infusion Start Time: 6886  Infusion Stop Time: 1400  Post Infusion IV Care: Peripheral IV Discontinued  Discharge: Condition: Good, Destination: Home . AVS provided to patient.   Performed by:  Cleophus Molt, RN

## 2022-05-28 ENCOUNTER — Other Ambulatory Visit: Payer: Self-pay | Admitting: Gastroenterology

## 2022-05-28 NOTE — Telephone Encounter (Signed)
Is he having active, ongoing symptoms, or if this prescription to help get him through a taper of his previous steroid course?  He was recently started on biologic therapy, so he should not be having to do continued steroids.

## 2022-05-29 ENCOUNTER — Encounter: Payer: Self-pay | Admitting: Gastroenterology

## 2022-05-29 ENCOUNTER — Telehealth (HOSPITAL_COMMUNITY): Payer: Self-pay | Admitting: *Deleted

## 2022-05-29 NOTE — Telephone Encounter (Signed)
Attempted to call patient regarding upcoming cardiac CT appointment. °Left message on voicemail with name and callback number ° °Starasia Sinko RN Navigator Cardiac Imaging °[facility] °[phone] Office °[phone] Cell ° °

## 2022-05-29 NOTE — Telephone Encounter (Signed)
Reaching out to patient to offer assistance regarding upcoming cardiac imaging study; pt verbalizes understanding of appt date/time, parking situation and where to check in, pre-test NPO status and medications ordered, and verified current allergies; name and call back number provided for further questions should they arise  Gordy Clement RN Navigator Cardiac [place] [facility] [phone] office [phone] cell  Patient to take 155m metoprolol tartrate two hours prior to his cardiac CT scan. He will hold his AM benicar and will arrive at 11am.

## 2022-05-30 ENCOUNTER — Ambulatory Visit (HOSPITAL_COMMUNITY)
Admission: RE | Admit: 2022-05-30 | Discharge: 2022-05-30 | Disposition: A | Discharge status: Home / Self Care | Payer: Medicare HMO | Source: Ambulatory Visit | Attending: Cardiology | Admitting: Cardiology

## 2022-05-30 DIAGNOSIS — R072 Precordial pain: Secondary | ICD-10-CM | POA: Insufficient documentation

## 2022-05-30 MED ORDER — NITROGLYCERIN 0.4 MG SL SUBL
SUBLINGUAL_TABLET | SUBLINGUAL | Status: AC
  Filled 2022-05-30: qty 2

## 2022-05-30 MED ORDER — IOHEXOL 350 MG/ML SOLN
100.0000 mL | Freq: Once | INTRAVENOUS | Status: AC | PRN
  Administered 2022-05-30: 100 mL via INTRAVENOUS

## 2022-05-30 MED ORDER — NITROGLYCERIN 0.4 MG SL SUBL
0.8000 mg | SUBLINGUAL_TABLET | Freq: Once | SUBLINGUAL | Status: AC
  Administered 2022-05-30: 0.8 mg via SUBLINGUAL

## 2022-06-03 DIAGNOSIS — M1711 Unilateral primary osteoarthritis, right knee: Secondary | ICD-10-CM | POA: Diagnosis not present

## 2022-06-03 NOTE — Telephone Encounter (Signed)
Yes, we want to try to avoid NSAIDs with Crohn's disease.

## 2022-06-04 ENCOUNTER — Ambulatory Visit: Payer: Medicare HMO | Admitting: Family Medicine

## 2022-06-18 ENCOUNTER — Ambulatory Visit (INDEPENDENT_AMBULATORY_CARE_PROVIDER_SITE_OTHER): Payer: Medicare HMO

## 2022-06-18 VITALS — BP 137/87 | HR 82 | Temp 98.4°F | Resp 18 | Ht 69.0 in | Wt 189.6 lb

## 2022-06-18 DIAGNOSIS — K5010 Crohn's disease of large intestine without complications: Secondary | ICD-10-CM

## 2022-06-18 MED ORDER — DIPHENHYDRAMINE HCL 25 MG PO CAPS
25.0000 mg | ORAL_CAPSULE | Freq: Once | ORAL | Status: AC
  Administered 2022-06-18: 25 mg via ORAL
  Filled 2022-06-18: qty 1

## 2022-06-18 MED ORDER — METHYLPREDNISOLONE SODIUM SUCC 40 MG IJ SOLR
40.0000 mg | Freq: Once | INTRAMUSCULAR | Status: AC
  Administered 2022-06-18: 40 mg via INTRAVENOUS
  Filled 2022-06-18: qty 1

## 2022-06-18 MED ORDER — ACETAMINOPHEN 325 MG PO TABS
650.0000 mg | ORAL_TABLET | Freq: Once | ORAL | Status: AC
  Administered 2022-06-18: 650 mg via ORAL
  Filled 2022-06-18: qty 2

## 2022-06-18 MED ORDER — SODIUM CHLORIDE 0.9 % IV SOLN
400.0000 mg | Freq: Once | INTRAVENOUS | Status: AC
  Administered 2022-06-18: 400 mg via INTRAVENOUS
  Filled 2022-06-18: qty 40

## 2022-06-18 NOTE — Progress Notes (Signed)
Diagnosis: Crohn's Disease  Provider:  Marshell Garfinkel, MD  Procedure: Infusion  IV Type: Peripheral, IV Location: L Hand  Remicade (Infliximab), Dose: 453m  Infusion Start Time: 0940  Infusion Stop Time: 15461 Post Infusion IV Care: Peripheral IV Discontinued  Discharge: Condition: Good, Destination: Home . AVS provided to patient.   Performed by:  SArnoldo Morale RN

## 2022-06-20 DIAGNOSIS — G5761 Lesion of plantar nerve, right lower limb: Secondary | ICD-10-CM | POA: Diagnosis not present

## 2022-06-20 DIAGNOSIS — R29898 Other symptoms and signs involving the musculoskeletal system: Secondary | ICD-10-CM | POA: Diagnosis not present

## 2022-07-04 IMAGING — CT CT HEART MORP W/ CTA COR W/ SCORE W/ CA W/CM &/OR W/O CM
4 of 7 series · 8 of 20 positions shown, 9 images · IV contrast (APPLIED)
Comparison: 07/22/2018 calcium score CT.

Addendum:
CLINICAL DATA: 65 yo male with chest pain

EXAM:
Cardiac/Coronary CTA
TECHNIQUE: A non-contrast, gated CT scan was obtained with axial slices of 3 mm
through the heart for calcium scoring. Calcium scoring was performed
using the Agatston method. A 120 kV prospective, gated, contrast
cardiac scan was obtained. Gantry rotation speed was 250 msecs and
collimation was 0.6 mm. Two sublingual nitroglycerin tablets (0.8
mg) were given. The 3D data set was reconstructed in 5% intervals of
the 35-75% of the R-R cycle. Diastolic phases were analyzed on a
dedicated workstation using MPR, MIP, and VRT modes. The patient
received 95 cc of contrast.

[Series 6: ts diast sharp · axial · 0.39mm/px · z∈[+1232,+1269]mm · 2 of 278 slices shown]
[im 93/278  lung]
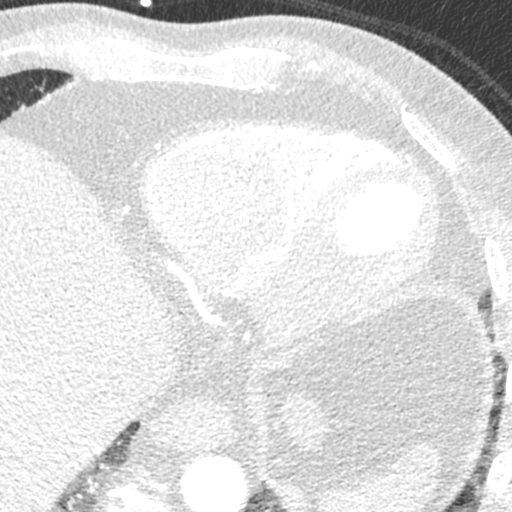
[im 185/278  lung]
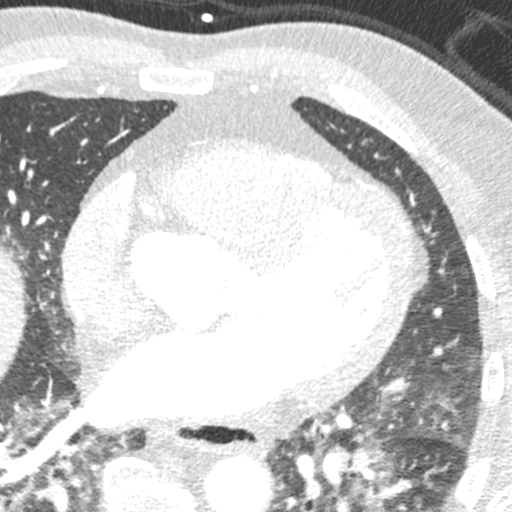

[Series 7: ts syst sharp · axial · 0.39mm/px · z∈[+1232,+1269]mm · 2 of 278 slices shown]
[im 93/278  lung]
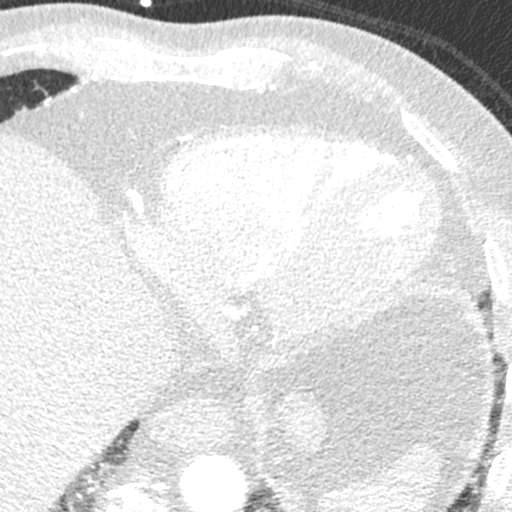
[im 185/278  lung]
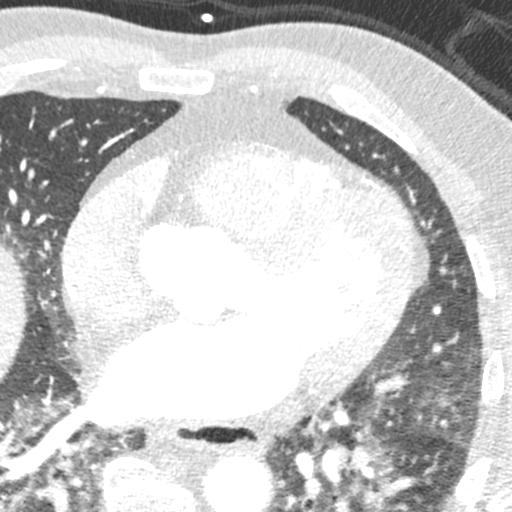

[Series 8: best diast · axial · 0.39mm/px · z∈[+1232,+1269]mm · 2 of 278 slices shown, 3 images]
[im 93/278  vessel]
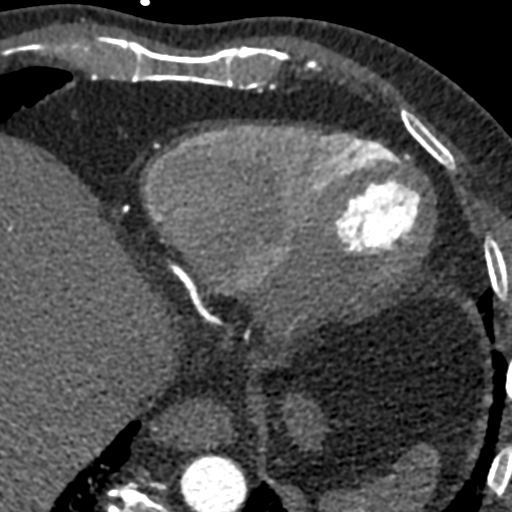
[im 93/278  lung]
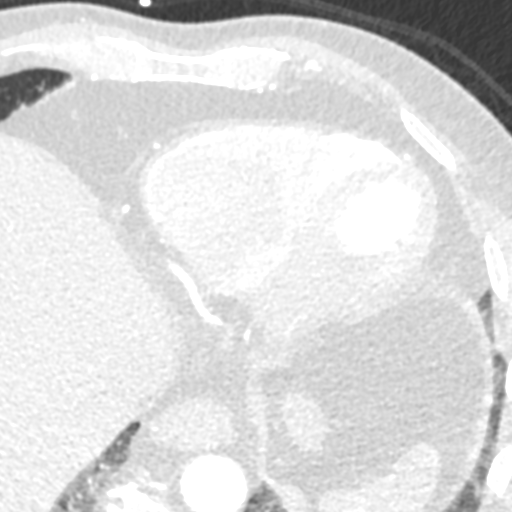
[im 185/278  vessel]
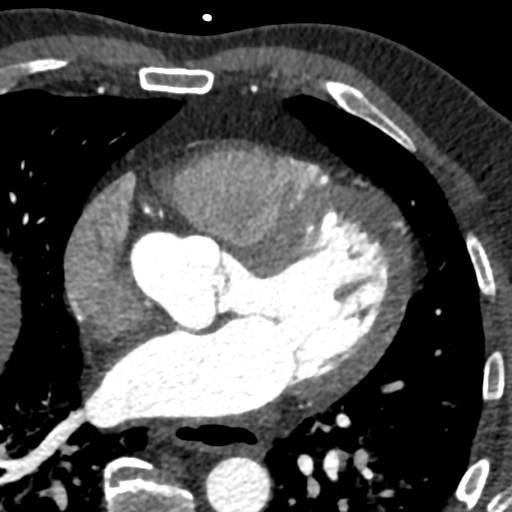

[Series 9: best syst · axial · 0.39mm/px · z∈[+1232,+1269]mm · 2 of 278 slices shown]
[im 93/278  vessel]
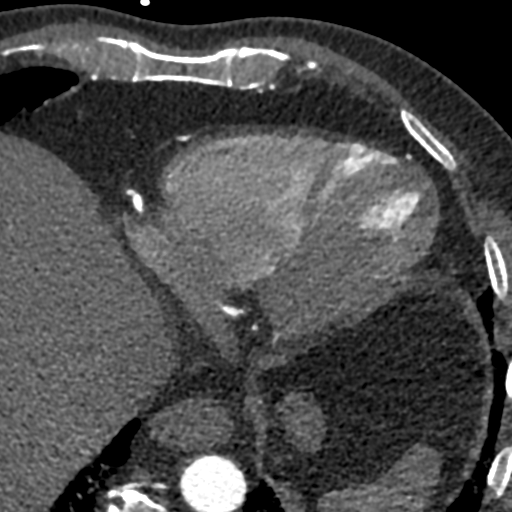
[im 185/278  vessel]
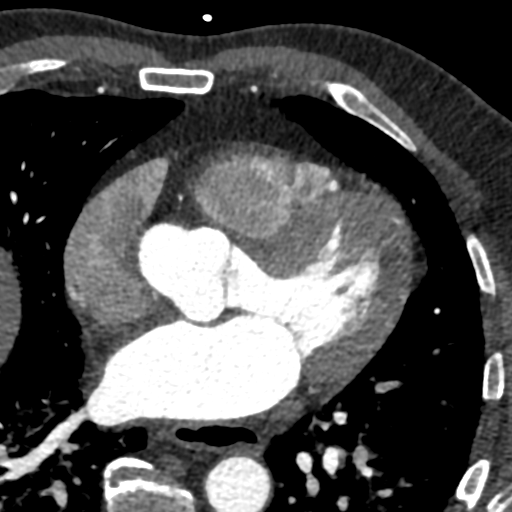

[8 of 20 positions shown; findings below may reference images not displayed]

FINDINGS: Image quality: Average.

Noise artifact is: Limited.

Coronary Arteries:  Normal coronary origin.  Right dominance.

Left main: The left main is a large caliber vessel with a normal
take off from the left coronary cusp that bifurcates to form a left
anterior descending artery and a left circumflex artery. There is no
plaque or stenosis.

Left anterior descending artery: The LAD has mild (25-49) mixed
plaque stenosis in the proximal to mid vessel. The LAD gives off 2
patent diagonal branches. D1 is large branching vessel and there is
mild (25-49) stenosis proximal to the bifurcation and minimal (0-24)
calcified plaque in the superior branch. D2 is large and there is
mild (25-49) soft plaque stenosis in the proximal vessel.

Left circumflex artery: The LCX is non-dominant and patent with no
evidence of plaque or stenosis. The LCX gives off 1 patent obtuse
marginal branch (difficult to assess distally).

Right coronary artery: The RCA is dominant with normal take off from
the right coronary cusp. There is minimal (0-24) calcified plaque in
the proximal vessel. The RCA terminates as a PDA and right
posterolateral branch without evidence of plaque or stenosis.

Right Atrium: Right atrial size is within normal limits.

Right Ventricle: The right ventricular cavity is within normal
limits.

Left Atrium: Left atrial size is normal in size with no left atrial
appendage filling defect.

Left Ventricle: The ventricular cavity size is within normal limits.
There are no stigmata of prior infarction. There is no abnormal
filling defect.

Pulmonary arteries: Normal in size without proximal filling defect.

Pulmonary veins: Normal pulmonary venous drainage.

Pericardium: Normal thickness with no significant effusion or
calcium present.

Cardiac valves: The aortic valve is trileaflet without significant
calcification. The mitral valve is normal structure without
significant calcification.

Aorta: Upper normal caliber (3.9 cm)  with aortic atherosclerosis.

Extra-cardiac findings: See attached radiology report for
non-cardiac structures.
IMPRESSION: 1. Coronary calcium score of 538. This was 82 percentile for age-,
sex, and race-matched controls.

2. Normal coronary origin with right dominance.

3. Mild (25-49) CAD in the LAD, D1 and D2.

4. Aortic atherosclerosis.

RECOMMENDATIONS:
CAD-RADS 2: Mild non-obstructive CAD (25-49%). Consider
non-atherosclerotic causes of chest pain. Consider preventive
therapy and risk factor modification.

EXAM:
OVER-READ INTERPRETATION  CT CHEST

The following report is a limited chest CT over-read performed by
This over-read does not include interpretation of cardiac or
coronary anatomy or pathology. The coronary CTA interpretation by
the cardiologist is attached.
FINDINGS: Vascular: Aortic atherosclerosis. No central pulmonary embolism, on
this non-dedicated study.

Mediastinum/Nodes: No imaged thoracic adenopathy. Mild esophageal
dilatation with fluid level within on [DATE].

Lungs/Pleura: No pleural fluid. Hypoventilation with resultant
dependent mosaic attenuation secondary to air trapping and
subsegmental atelectasis.

Upper Abdomen: Segment 2 1.6 cm hepatic cyst or minimally complex
cyst, similar to on the prior. Normal imaged portions of the spleen,
stomach, gallbladder.

Musculoskeletal: No acute osseous abnormality.
IMPRESSION: No acute findings in the imaged extracardiac chest.

Esophageal air fluid level suggests dysmotility or gastroesophageal
reflux.

*** End of Addendum ***
FINDINGS: Image quality: Average.

Noise artifact is: Limited.

Coronary Arteries:  Normal coronary origin.  Right dominance.

Left main: The left main is a large caliber vessel with a normal
take off from the left coronary cusp that bifurcates to form a left
anterior descending artery and a left circumflex artery. There is no
plaque or stenosis.

Left anterior descending artery: The LAD has mild (25-49) mixed
plaque stenosis in the proximal to mid vessel. The LAD gives off 2
patent diagonal branches. D1 is large branching vessel and there is
mild (25-49) stenosis proximal to the bifurcation and minimal (0-24)
calcified plaque in the superior branch. D2 is large and there is
mild (25-49) soft plaque stenosis in the proximal vessel.

Left circumflex artery: The LCX is non-dominant and patent with no
evidence of plaque or stenosis. The LCX gives off 1 patent obtuse
marginal branch (difficult to assess distally).

Right coronary artery: The RCA is dominant with normal take off from
the right coronary cusp. There is minimal (0-24) calcified plaque in
the proximal vessel. The RCA terminates as a PDA and right
posterolateral branch without evidence of plaque or stenosis.

Right Atrium: Right atrial size is within normal limits.

Right Ventricle: The right ventricular cavity is within normal
limits.

Left Atrium: Left atrial size is normal in size with no left atrial
appendage filling defect.

Left Ventricle: The ventricular cavity size is within normal limits.
There are no stigmata of prior infarction. There is no abnormal
filling defect.

Pulmonary arteries: Normal in size without proximal filling defect.

Pulmonary veins: Normal pulmonary venous drainage.

Pericardium: Normal thickness with no significant effusion or
calcium present.

Cardiac valves: The aortic valve is trileaflet without significant
calcification. The mitral valve is normal structure without
significant calcification.

Aorta: Upper normal caliber (3.9 cm)  with aortic atherosclerosis.

Extra-cardiac findings: See attached radiology report for
non-cardiac structures.
IMPRESSION: 1. Coronary calcium score of 538. This was 82 percentile for age-,
sex, and race-matched controls.

2. Normal coronary origin with right dominance.

3. Mild (25-49) CAD in the LAD, D1 and D2.

4. Aortic atherosclerosis.

RECOMMENDATIONS:
CAD-RADS 2: Mild non-obstructive CAD (25-49%). Consider
non-atherosclerotic causes of chest pain. Consider preventive
therapy and risk factor modification.

## 2022-07-05 ENCOUNTER — Ambulatory Visit: Payer: Medicare HMO | Admitting: Family Medicine

## 2022-07-08 DIAGNOSIS — M1711 Unilateral primary osteoarthritis, right knee: Secondary | ICD-10-CM | POA: Diagnosis not present

## 2022-07-08 NOTE — Progress Notes (Signed)
HPI: FU CAD.  Nuclear study August 2019 showed ejection fraction 45% but visually appeared better.  Perfusion was normal. Calcium score July 2019 410 which was 85th percentile for age and sex matched control.  At last office visit patient was describing occasional chest pain when he was cycling vigorously.  Echocardiogram May 2023 showed normal LV function, mild left ventricular hypertrophy, grade 1 diastolic dysfunction.  Cardiac CTA June 2023 showed calcium score 538 which was 82nd percentile and mild nonobstructive coronary disease most significant being 25 to 49% in the LAD, D1 and D2.  Since last seen the patient denies any dyspnea on exertion, orthopnea, PND, pedal edema, palpitations, syncope or chest pain.   Current Outpatient Medications  Medication Sig Dispense Refill   amLODipine (NORVASC) 5 MG tablet Take 7.5 mg by mouth daily. One and one half tablet by mouth ( 7.5 mg) daily.     aspirin EC 81 MG tablet Take 1 tablet (81 mg total) by mouth daily. Swallow whole. 90 tablet 3   ezetimibe (ZETIA) 10 MG tablet TAKE 1 TABLET BY MOUTH EVERY DAY 90 tablet 0   inFLIXimab (REMICADE) 100 MG injection Remicade 100 mg intravenous solution  INFUSE 400 MG IV ON DAY 1, DAY 14, DAY 42 AND EVERY 8 WEEKS THEREAFTER     loperamide (IMODIUM) 2 MG capsule Take 2 mg by mouth as needed for diarrhea or loose stools.     meloxicam (MOBIC) 15 MG tablet Take 15 mg by mouth daily.     metoprolol tartrate (LOPRESSOR) 100 MG tablet TAKE 2 HOURS PRIOR TO CT SCAN 1 tablet 0   mupirocin ointment (BACTROBAN) 2 % Apply to affected area twice daily for 7 days. Repeat as needed 15 g 0   olmesartan (BENICAR) 20 MG tablet Take 20 mg by mouth daily.     omeprazole (PRILOSEC) 40 MG capsule TAKE 1 CAPSULE (40 MG TOTAL) BY MOUTH IN THE MORNING AND AT BEDTIME. 180 capsule 3   venlafaxine (EFFEXOR) 37.5 MG tablet TAKE 1 TABLET BY MOUTH TWICE A DAY 180 tablet 1   Current Facility-Administered Medications  Medication Dose  Route Frequency Provider Last Rate Last Admin   0.9 %  sodium chloride infusion  500 mL Intravenous Once Cirigliano, Vito V, DO         Past Medical History:  Diagnosis Date   Crohn's disease ([place])    Depression    Dyspnea on exertion    GERD (gastroesophageal reflux disease)    Herpes    History of kidney stones    Hyperlipidemia    Hypertension    Instability of knee joint    Left displaced femoral neck fracture ([place]) 07/11/2017   Snoring     Past Surgical History:  Procedure Laterality Date   COLONOSCOPY  2007   Dr. Sabino Donovan normal TI, ascending colon/cecum with mild inflammation noted but biopses without signs of active inflammation, hyperpastic polyp, possible ischemia on path of left colon biopsy   COLONOSCOPY N/A 11/08/2015   Procedure: COLONOSCOPY;  Surgeon: [name], MD;  [facility];  Service: Endoscopy;  Laterality: N/A;   ESOPHAGOGASTRODUODENOSCOPY  2007   Dr. Sabino Donovan reflux esophagitis, hiatal hernia, negative H.pylori   ESOPHAGOGASTRODUODENOSCOPY N/A 11/08/2015   Procedure: ESOPHAGOGASTRODUODENOSCOPY (EGD);  Surgeon: [name], MD;  [facility];  Service: Endoscopy;  Laterality: N/A;  0830 - moved to 8:15 - office to notify   HIP PINNING,CANNULATED Left 07/12/2017   Procedure: CANNULATED  HIP PINNING LEFT;  Surgeon: Rod Can, MD;  Location: [facility];  Service: Orthopedics;  Laterality: Left;    Social History   Socioeconomic History   Marital status: Married    Spouse name: Ara Mano   Number of children: 2   Years of education: 14   Highest education level: Some college, no degree  Occupational History   Occupation: Retired  Tobacco Use   Smoking status: Never   Smokeless tobacco: Never  Vaping Use   Vaping Use: Never used  Substance and Sexual Activity   Alcohol use: Not Currently   Drug use: No   Sexual activity: Not Currently    Birth control/protection: None  Other Topics Concern   Not on file  Social History  Narrative   Lives with wife. He has two children. He enjoys cycling, kayaking and working on model cars.   Social Determinants of Health   Financial Resource Strain: Low Risk  (03/27/2022)   Overall Financial Resource Strain (CARDIA)    Difficulty of Paying Living Expenses: Not hard at all  Food Insecurity: No Food Insecurity (03/27/2022)   Hunger Vital Sign    Worried About Running Out of Food in the Last Year: Never true    Ran Out of Food in the Last Year: Never true  Transportation Needs: No Transportation Needs (03/27/2022)   PRAPARE - Hydrologist (Medical): No    Lack of Transportation (Non-Medical): No  Physical Activity: Sufficiently Active (03/27/2022)   Exercise Vital Sign    Days of Exercise per Week: 3 days    Minutes of Exercise per Session: 60 min  Stress: No Stress Concern Present (03/27/2022)   [place]    Feeling of Stress : Only a little  Social Connections: Socially Isolated (03/27/2022)   Social Connection and Isolation Panel [NHANES]    Frequency of Communication with Friends and Family: Once a week    Frequency of Social Gatherings with Friends and Family: Once a week    Attends Religious Services: Never    Marine scientist or Organizations: No    Attends Archivist Meetings: Never    Marital Status: Married  Human resources officer Violence: Not At Risk (03/27/2022)   Humiliation, Afraid, Rape, and Kick questionnaire    Fear of Current or Ex-Partner: No    Emotionally Abused: No    Physically Abused: No    Sexually Abused: No    Family History  Problem Relation Age of Onset   Melanoma Father    Heart disease Father    Healthy Mother    Colon cancer Neg Hx    Esophageal cancer Neg Hx     ROS: no fevers or chills, productive cough, hemoptysis, dysphasia, odynophagia, melena, hematochezia, dysuria, hematuria, rash, seizure activity, orthopnea, PND, pedal  edema, claudication. Remaining systems are negative.  Physical Exam: Well-developed well-nourished in no acute distress.  Skin is warm and dry.  HEENT is normal.  Neck is supple.  Chest is clear to auscultation with normal expansion.  Cardiovascular exam is regular rate and rhythm.  Abdominal exam nontender or distended. No masses palpated. Extremities show no edema. neuro grossly intact  A/P  1 coronary artery disease-mild on recent CTA.  Plan to continue medical therapy with aspirin.  Continue Crestor and Zetia.  2 hyperlipidemia-continue Zetia.  He did not begin Crestor as previously recommended but is willing to try now.  Begin 20 mg daily.  Check lipids and liver in 8 weeks.  If not at goal we will consider Repatha or Praluent.  Patient was intolerant of Lipitor.  3 hypertension-blood pressure controlled.  Continue present medical regimen.  Kirk Ruths, MD

## 2022-07-17 ENCOUNTER — Encounter: Payer: Self-pay | Admitting: Gastroenterology

## 2022-07-17 ENCOUNTER — Encounter: Payer: Self-pay | Admitting: Cardiology

## 2022-07-17 ENCOUNTER — Ambulatory Visit: Payer: Medicare HMO | Admitting: Cardiology

## 2022-07-17 VITALS — BP 114/80 | HR 95 | Ht 69.0 in | Wt 180.0 lb

## 2022-07-17 DIAGNOSIS — I10 Essential (primary) hypertension: Secondary | ICD-10-CM

## 2022-07-17 DIAGNOSIS — I2510 Atherosclerotic heart disease of native coronary artery without angina pectoris: Secondary | ICD-10-CM | POA: Diagnosis not present

## 2022-07-17 DIAGNOSIS — E785 Hyperlipidemia, unspecified: Secondary | ICD-10-CM | POA: Diagnosis not present

## 2022-07-17 MED ORDER — ROSUVASTATIN CALCIUM 20 MG PO TABS: 20.0000 mg | ORAL_TABLET | Freq: Every day | ORAL | 3 refills | Status: DC

## 2022-07-17 NOTE — Patient Instructions (Signed)

## 2022-07-17 NOTE — Telephone Encounter (Signed)
Spoke with Tyler Gates and Tyler Gates was not able to do an in person visit tomorrow. Tyler Gates states he feels like his remicade needs to be adjusted. Tyler Gates aware that Dr. Bryan Lemma is out of the office this week and Tyler Gates requested to wait until Dr. Bryan Lemma returns for his recommendations.

## 2022-07-18 ENCOUNTER — Ambulatory Visit: Payer: Medicare HMO | Admitting: Nurse Practitioner

## 2022-07-22 ENCOUNTER — Other Ambulatory Visit: Payer: Self-pay | Admitting: Internal Medicine

## 2022-07-26 ENCOUNTER — Encounter: Payer: Self-pay | Admitting: Cardiology

## 2022-07-29 ENCOUNTER — Ambulatory Visit (INDEPENDENT_AMBULATORY_CARE_PROVIDER_SITE_OTHER): Payer: Medicare HMO | Admitting: Gastroenterology

## 2022-07-29 DIAGNOSIS — Z23 Encounter for immunization: Secondary | ICD-10-CM | POA: Diagnosis not present

## 2022-08-03 ENCOUNTER — Other Ambulatory Visit: Payer: Self-pay | Admitting: Cardiology

## 2022-08-03 DIAGNOSIS — E785 Hyperlipidemia, unspecified: Secondary | ICD-10-CM

## 2022-08-13 ENCOUNTER — Ambulatory Visit (INDEPENDENT_AMBULATORY_CARE_PROVIDER_SITE_OTHER): Payer: Medicare HMO

## 2022-08-13 VITALS — BP 139/87 | HR 82 | Temp 98.2°F | Resp 18 | Ht 69.0 in | Wt 188.6 lb

## 2022-08-13 DIAGNOSIS — K5010 Crohn's disease of large intestine without complications: Secondary | ICD-10-CM

## 2022-08-13 MED ORDER — METHYLPREDNISOLONE SODIUM SUCC 40 MG IJ SOLR
40.0000 mg | Freq: Once | INTRAMUSCULAR | Status: AC
  Administered 2022-08-13: 40 mg via INTRAVENOUS
  Filled 2022-08-13: qty 1

## 2022-08-13 MED ORDER — ACETAMINOPHEN 325 MG PO TABS
650.0000 mg | ORAL_TABLET | Freq: Once | ORAL | Status: AC
  Administered 2022-08-13: 650 mg via ORAL
  Filled 2022-08-13: qty 2

## 2022-08-13 MED ORDER — SODIUM CHLORIDE 0.9 % IV SOLN
5.0000 mg/kg | Freq: Once | INTRAVENOUS | Status: AC
  Administered 2022-08-13: 400 mg via INTRAVENOUS
  Filled 2022-08-13: qty 40

## 2022-08-13 MED ORDER — DIPHENHYDRAMINE HCL 25 MG PO CAPS
25.0000 mg | ORAL_CAPSULE | Freq: Once | ORAL | Status: AC
  Administered 2022-08-13: 25 mg via ORAL
  Filled 2022-08-13: qty 1

## 2022-08-13 NOTE — Progress Notes (Signed)
Diagnosis: Crohn's Disease  Provider:  Marshell Garfinkel MD  Procedure: Infusion  IV Type: Peripheral, IV Location: L Hand  Remicade (Infliximab), Dose: 432m  Infusion Start Time: 1000  Infusion Stop Time: 1216  Post Infusion IV Care: Peripheral IV Discontinued  Discharge: Condition: Good, Destination: Home . AVS provided to patient.   Performed by:  SArnoldo Morale RN

## 2022-08-21 ENCOUNTER — Encounter: Payer: Self-pay | Admitting: Cardiology

## 2022-08-21 MED ORDER — OLMESARTAN MEDOXOMIL 20 MG PO TABS: 20.0000 mg | ORAL_TABLET | Freq: Every day | ORAL | 3 refills | Status: DC

## 2022-09-01 ENCOUNTER — Encounter: Payer: Self-pay | Admitting: Gastroenterology

## 2022-10-08 ENCOUNTER — Ambulatory Visit (INDEPENDENT_AMBULATORY_CARE_PROVIDER_SITE_OTHER): Payer: Medicare HMO

## 2022-10-08 VITALS — BP 142/80 | HR 83 | Temp 98.0°F | Resp 18 | Ht 69.0 in | Wt 191.0 lb

## 2022-10-08 DIAGNOSIS — K5010 Crohn's disease of large intestine without complications: Secondary | ICD-10-CM

## 2022-10-08 MED ORDER — ACETAMINOPHEN 325 MG PO TABS
650.0000 mg | ORAL_TABLET | Freq: Once | ORAL | Status: AC
  Administered 2022-10-08: 650 mg via ORAL
  Filled 2022-10-08: qty 2

## 2022-10-08 MED ORDER — SODIUM CHLORIDE 0.9 % IV SOLN
5.0000 mg/kg | Freq: Once | INTRAVENOUS | Status: AC
  Administered 2022-10-08: 400 mg via INTRAVENOUS
  Filled 2022-10-08: qty 40

## 2022-10-08 MED ORDER — METHYLPREDNISOLONE SODIUM SUCC 40 MG IJ SOLR
40.0000 mg | Freq: Once | INTRAMUSCULAR | Status: AC
  Administered 2022-10-08: 40 mg via INTRAVENOUS
  Filled 2022-10-08: qty 1

## 2022-10-08 MED ORDER — DIPHENHYDRAMINE HCL 25 MG PO CAPS
25.0000 mg | ORAL_CAPSULE | Freq: Once | ORAL | Status: AC
  Administered 2022-10-08: 25 mg via ORAL
  Filled 2022-10-08: qty 1

## 2022-10-08 NOTE — Progress Notes (Signed)
Diagnosis: Crohn's Disease  Provider:  Marshell Garfinkel MD  Procedure: Infusion  IV Type: Peripheral, IV Location: L Hand  Remicade (Infliximab), Dose: 400 mg  Infusion Start Time: 1000  Infusion Stop Time: 1215  Post Infusion IV Care: Peripheral IV Discontinued  Discharge: Condition: Good, Destination: Home . AVS provided to patient.   Performed by:  Arnoldo Morale, RN

## 2022-10-10 ENCOUNTER — Encounter: Payer: Self-pay | Admitting: *Deleted

## 2022-10-21 ENCOUNTER — Encounter: Payer: Self-pay | Admitting: Gastroenterology

## 2022-10-22 NOTE — Telephone Encounter (Signed)
Ok to schedule appt with me or one of the APPs (virtual ok) to discuss.

## 2022-10-22 NOTE — Telephone Encounter (Signed)
Pt scheduled for virtual appt with Dr. Bryan Lemma on 10/23/22 at 10 am. Pt verbalized understanding.

## 2022-10-23 ENCOUNTER — Telehealth (INDEPENDENT_AMBULATORY_CARE_PROVIDER_SITE_OTHER): Payer: Medicare HMO | Admitting: Gastroenterology

## 2022-10-23 ENCOUNTER — Telehealth: Payer: Self-pay

## 2022-10-23 ENCOUNTER — Other Ambulatory Visit (HOSPITAL_COMMUNITY): Payer: Self-pay

## 2022-10-23 ENCOUNTER — Encounter: Payer: Self-pay | Admitting: Gastroenterology

## 2022-10-23 VITALS — Ht 69.0 in | Wt 185.0 lb

## 2022-10-23 DIAGNOSIS — R140 Abdominal distension (gaseous): Secondary | ICD-10-CM

## 2022-10-23 DIAGNOSIS — K50119 Crohn's disease of large intestine with unspecified complications: Secondary | ICD-10-CM

## 2022-10-23 MED ORDER — AZATHIOPRINE 50 MG PO TABS: 100.0000 mg | ORAL_TABLET | Freq: Every day | ORAL | 0 refills | Status: DC

## 2022-10-23 NOTE — Progress Notes (Signed)
Chief Complaint: Crohn's disease, abdominal bloating  GI history: 66 year old male with a history of CKD (creatinine ~1.3), GERD, HTN, HLD, follows in the GI clinic for Crohn's Colitis and GERD.   1) Crohn's Disease: longstanding history of steroid responsive Crohn's colitis, previously followed by Dr. Gala Romney at [facility]. Was treated with budesonide in 10/2019.  Restarted Lialda 4.8 g/day in 03/2020 for breakthrough symptoms with good clinical response.  Had breakthrough when weaning to 2.4 g/day.  Responded with slight increase to 3.6 g/day.  Active colitis on colonoscopy in 05/2020, but patient preferred continued Lialda rather than escalation of therapy.  Had issues obtaining Lialda due to insurance.  Breakthrough with Apriso and Entocort.  Good response to repeat trial of Uceris, but no durable relief.  Finally able to get back on Lialda in 07/2021.  Continued breakthrough symptoms despite high-dose Lialda and eventually transition to Remicade in 04/2022 (Humira was unaffordable and did not receive assistance through [employer] assist). - ESR/CRP not reliably elevated during flare - Fecal calprotectin will elevate with flares - Has a sulfa allergy-cannot do sulfasalazine   He is an avid mountain biker.     IBD History:  Diagnosed in his 20's Index symptoms: Abdominal cramping, loose, nonbloody stools. No UGI sxs. No nocturnal stools or tenesmus.    Evaluation to date: - TPMT: N/A - TB testing: Negative QuantiFERON gold - HBV status: Negative. Started hepatitis A/B vaccine series - Pertinent Imaging: None for review - Last colonoscopy:  05/2020 - Small bowel imaging: None - History of EIMs: None   Current medication: Remicade Previous medications: Budesonide ER, Pentasa, Apriso, Lialda, Azulfidine, prednisone.     Health Maintenance: - DEXA: Declined - Vaccinations:      - Annual Flu Vaccine -UTD      - Pneumococcal Vaccine if receiving immunosuppression: Prevnar 20  with PCM      - Zoster vaccine if over age 58: Obtained through [facility] - Micronutrient eval:      - Annual Vit D, B6, iron panel: Ordered today      - Ileal disease: B12, fat soluble vitamins: Ordered today - Surveillance colonoscopy: Will schedule for early 2024 as below - Surveillance labs for immunomodulators: N/A - Annual depression screening: None - Annual Dermatology/Skin exam: Will discuss referral at follow-up   Endoscopic Hx: -Colonoscopy (05/2020, Dr. Bryan Lemma): Moderate, active colitis (erythema, edema, aphthous ulcers) from transverse colon to cecum.  Mild inflammation in the left colon in a patchy distribution with rectal sparing.  Sigmoid diverticulosis.  Internal hemorrhoids.  Normal TI.  Benign rectal hyperplastic polyps -Colonoscopy (10/2015, Dr. Gala Romney): Mild inflammatory changes, mild to moderate chronic colitis predominantly on the right sided endoscopically, for biopsies throughout colon with Crohn's disease, aside from rectal sparing.  Normal TI -EGD (02/01/2015, Dr. Gala Romney): 1 cm salmon-colored tongue x2 (biopsy: Gastritis), patulous EG junction with 2-3 cm HH, normal stomach and duodenum -Colonoscopy (2007, Dr. Sabino Donovan): Normal TI, ascending colon/cecum mild inflammation but biopsies without active inflammation, hyperplastic polyp, possible ischemia on path left colon biopsy   2) GERD: History of GERD and hiatal hernia.  Reflux symptoms well controlled on Nexium 40 mg BID.  Was previously treated with omeprazole 40 mg bid.    HPI:    Due to current restrictions/limitations of in-office visits due to the COVID-19 pandemic, this scheduled clinical appointment was converted to a telehealth virtual consultation using MyChart video  -Time of medical discussion: 25 minutes -The  patient did consent to this virtual visit and is aware of possible charges through their insurance for this visit.  -Names of all parties present: Tyler Gates (patient), [name], DO, [facility]  (physician) -Patient location: Home -Physician location: Office  Tyler Gates is a 66 y.o. male referred to the Gastroenterology Clinic for follow-up.  Last seen by me on 02/28/2022.  Due to continued Crohn's symptoms, was treated with prednisone and transition to Infliximab (Remicade) in 04/2022.  Has transitioned off Lialda.  Has been doing well since initiating Remicade.  Completed loading dose without issue, and on 10/17 received his second 8-week infusion.  Does note that 2 weeks prior to this most recent infusion he started developing abdominal bloating and generalized abdominal discomfort.  No pain.  No diarrhea or hematochezia.  Interestingly, did have flu vaccine and COVID booster about 1 week prior to symptoms onset.  After infusion on 10/17, symptoms have improved, but not all the way back to baseline yet.  Other than normal BMP in 04/2022, no new labs or abdominal imaging for review since last appointment.  Past medical history, past surgical history, social history, family history, medications, and allergies reviewed in the chart and with patient.    Past Medical History:  Diagnosis Date   Crohn's disease ([place])    Depression    Dyspnea on exertion    GERD (gastroesophageal reflux disease)    Herpes    History of kidney stones    Hyperlipidemia    Hypertension    Instability of knee joint    Left displaced femoral neck fracture ([place]) 07/11/2017   Snoring      Past Surgical History:  Procedure Laterality Date   COLONOSCOPY  2007   Dr. Sabino Donovan normal TI, ascending colon/cecum with mild inflammation noted but biopses without signs of active inflammation, hyperpastic polyp, possible ischemia on path of left colon biopsy   COLONOSCOPY N/A 11/08/2015   Procedure: COLONOSCOPY;  Surgeon: [name], MD;  [facility];  Service: Endoscopy;  Laterality: N/A;   ESOPHAGOGASTRODUODENOSCOPY  2007   Dr. Sabino Donovan reflux esophagitis, hiatal hernia, negative H.pylori    ESOPHAGOGASTRODUODENOSCOPY N/A 11/08/2015   Procedure: ESOPHAGOGASTRODUODENOSCOPY (EGD);  Surgeon: [name], MD;  [facility];  Service: Endoscopy;  Laterality: N/A;  0830 - moved to 8:15 - office to notify   HIP PINNING,CANNULATED Left 07/12/2017   Procedure: CANNULATED HIP PINNING LEFT;  Surgeon: Rod Can, MD;  [facility];  Service: Orthopedics;  Laterality: Left;   Family History  Problem Relation Age of Onset   Melanoma Father    Heart disease Father    Healthy Mother    Colon cancer Neg Hx    Esophageal cancer Neg Hx    Social History   Tobacco Use   Smoking status: Never   Smokeless tobacco: Never  Vaping Use   Vaping Use: Never used  Substance Use Topics   Alcohol use: Not Currently   Drug use: No   Current Outpatient Medications  Medication Sig Dispense Refill   amLODipine (NORVASC) 5 MG tablet Take 7.5 mg by mouth daily. One and one half tablet by mouth ( 7.5 mg) daily.     aspirin EC 81 MG tablet Take 1 tablet (81 mg total) by mouth daily. Swallow whole. 90 tablet 3   ezetimibe (ZETIA) 10 MG tablet TAKE 1 TABLET BY MOUTH EVERY DAY 90 tablet 2   inFLIXimab (REMICADE) 100 MG injection Remicade 100 mg intravenous solution  INFUSE 400 MG IV ON DAY 1, DAY 14, DAY 42 AND EVERY 8 WEEKS THEREAFTER     loperamide (IMODIUM) 2 MG capsule Take 2 mg by mouth as needed for diarrhea or loose stools.     meloxicam (MOBIC) 15 MG tablet Take 15 mg by mouth daily.     metoprolol tartrate (LOPRESSOR) 100 MG tablet TAKE 2 HOURS PRIOR TO CT SCAN 1 tablet 0   mupirocin ointment (BACTROBAN) 2 % Apply to affected area twice daily for 7 days. Repeat as needed 15 g 0   olmesartan (BENICAR) 20 MG tablet Take 1 tablet (20 mg total) by mouth daily. 90 tablet 3   omeprazole (PRILOSEC) 40 MG capsule TAKE 1 CAPSULE (40 MG TOTAL) BY MOUTH IN THE MORNING AND AT BEDTIME. 180 capsule 3   rosuvastatin (CRESTOR) 20 MG tablet Take 1 tablet (20 mg total) by mouth daily. 90 tablet  3   venlafaxine (EFFEXOR) 37.5 MG tablet TAKE 1 TABLET BY MOUTH TWICE A DAY 180 tablet 1   Current Facility-Administered Medications  Medication Dose Route Frequency Provider Last Rate Last Admin   0.9 %  sodium chloride infusion  500 mL Intravenous Once Jillienne Egner V, DO       Allergies  Allergen Reactions   Sulfasalazine Other (See Comments)    Flu like feelings   Statins Other (See Comments)    REACTION: Myalgia     Review of Systems: All systems reviewed and negative except where noted in HPI.     Physical Exam:    Complete physical exam not completed due to the nature of this telehealth communication.   Gen: Awake, alert, and oriented, and well communicative. HEENT: EOMI, non-icteric sclera, NCAT, MMM Neck: Normal movement of head and neck Pulm: No labored breathing, speaking in full sentences without conversational dyspnea Derm: No apparent lesions or bruising in visible field MS: Moves all visible extremities without noticeable abnormality Psych: Pleasant, cooperative, normal speech, thought processing seemingly intact   ASSESSMENT AND PLAN;   1) Chronic Colitis 2) Abdominal bloating 66 year old male with longstanding history of steroid responsive Crohn's Colitis. Previously in clinical remission with Lialda 3.6 g/day, but disease course has continued to progress and no longer controlled with mesalamine agents, so transitioned to Remicade in 04/2022 with good response.  Started to develop some vague generalized abdominal discomfort and bloating about 2 weeks prior to most recent infusion.  Unsure if this represents mild flare, but also could have been related to recent flu vaccine and COVID booster.  Symptoms improving now since infusion on 10/08/2022.  Discussed possibility of decreased medication efficacy, antibody formation, low trough, breakthrough, etc. with plan as follows:  - Check fecal calprotectin now.  Has not had elevated ESR/CRP with previous flares,  so no role in checking those - No diarrhea; not planning to check GI PCR panel at this juncture - Discussed starting Imuran 2 mg/kg for dual purpose of additional immunosuppression along with immunogenicity.  Will make decision of whether or not to start based on labs and symptomatology, but he asked to submit paperwork for authorization and insurance coverage which we will do - Discussed immunomodulators therapy to include ADR profile at length today - Check infliximab trough and antibody 1-2 week prior to next infusion (late November)  - Check vitamin D, vitamin B6, B12 for routine micronutrient assessment - DEXA scan now that he is 6+ months from completion of steroids - Tentative plan for repeat colonoscopy in early 2024 to assess for deep  remission.  However, if above work-up unrevealing and continued symptoms, can plan to get this done sooner to evaluate for active disease despite Remicade  RTC in 3 months or sooner prn   Lavena Bullion, DO, FACG  10/23/2022, 8:34 AM   Tyler Nutting, DO

## 2022-10-23 NOTE — Patient Instructions (Addendum)
Your provider has requested that you go to the basement level for lab work before leaving today. Press "B" on the elevator. The lab is located at the first door on the left as you exit the elevator.  Get your Infliximab lab in the end of November.  Please follow up in 6 months. Give [place] a call at [phone] to schedule an appointment.   We will check insurance for coverage of Imuran 2 MG.  Due to recent changes in healthcare laws, you may see the results of your imaging and laboratory studies on MyChart before your provider has had a chance to review them.  We understand that in some cases there may be results that are confusing or concerning to you. Not all laboratory results come back in the same time frame and the provider may be waiting for multiple results in order to interpret others.  Please give [place] 48 hours in order for your provider to thoroughly review all the results before contacting the office for clarification of your results.    Thank you for choosing me and [facility].  Vito Cirigliano, D.O.

## 2022-10-23 NOTE — Telephone Encounter (Signed)
PA is not required for this Azathioprine. It is covered on the patients plan.

## 2022-10-23 NOTE — Telephone Encounter (Signed)
Hi, Can we obtain PA for Azathioprine ( Imuran ) 50 MG Please? Patient will be taking 100 MG daily. Thank you.

## 2022-10-24 ENCOUNTER — Other Ambulatory Visit (INDEPENDENT_AMBULATORY_CARE_PROVIDER_SITE_OTHER): Payer: Medicare HMO

## 2022-10-24 DIAGNOSIS — K50119 Crohn's disease of large intestine with unspecified complications: Secondary | ICD-10-CM

## 2022-10-24 DIAGNOSIS — R140 Abdominal distension (gaseous): Secondary | ICD-10-CM

## 2022-10-24 LAB — VITAMIN D 25 HYDROXY (VIT D DEFICIENCY, FRACTURES): VITD: 21.08 ng/mL — ABNORMAL LOW (ref 30.00–100.00)

## 2022-10-24 LAB — VITAMIN B12: Vitamin B-12: 397 pg/mL (ref 211–911)

## 2022-10-24 LAB — FOLATE: Folate: 19.6 ng/mL (ref >5.9)

## 2022-10-25 ENCOUNTER — Other Ambulatory Visit: Payer: Medicare HMO

## 2022-10-25 DIAGNOSIS — R140 Abdominal distension (gaseous): Secondary | ICD-10-CM

## 2022-10-25 DIAGNOSIS — K50119 Crohn's disease of large intestine with unspecified complications: Secondary | ICD-10-CM

## 2022-10-28 ENCOUNTER — Other Ambulatory Visit: Payer: Self-pay

## 2022-10-28 DIAGNOSIS — E559 Vitamin D deficiency, unspecified: Secondary | ICD-10-CM

## 2022-10-28 MED ORDER — VITAMIN D (ERGOCALCIFEROL) 1.25 MG (50000 UNIT) PO CAPS: 50000.0000 [IU] | ORAL_CAPSULE | ORAL | 0 refills | Status: DC

## 2022-10-31 ENCOUNTER — Other Ambulatory Visit: Payer: Self-pay | Admitting: Family Medicine

## 2022-10-31 DIAGNOSIS — F3340 Major depressive disorder, recurrent, in remission, unspecified: Secondary | ICD-10-CM

## 2022-10-31 LAB — VITAMIN B6: Vitamin B6: 15.9 ng/mL (ref 2.1–21.7)

## 2022-11-01 LAB — CALPROTECTIN, FECAL: Calprotectin, Fecal: 343 ug/g — ABNORMAL HIGH (ref 0–120)

## 2022-11-06 DIAGNOSIS — E785 Hyperlipidemia, unspecified: Secondary | ICD-10-CM | POA: Diagnosis not present

## 2022-11-07 ENCOUNTER — Other Ambulatory Visit: Payer: Self-pay

## 2022-11-07 DIAGNOSIS — K50119 Crohn's disease of large intestine with unspecified complications: Secondary | ICD-10-CM

## 2022-11-07 LAB — HEPATIC FUNCTION PANEL
ALT: 37 IU/L (ref 0–44)
AST: 29 IU/L (ref 0–40)
Albumin: 4.5 g/dL (ref 3.9–4.9)
Alkaline Phosphatase: 71 IU/L (ref 44–121)
Bilirubin Total: 0.6 mg/dL (ref 0.0–1.2)
Bilirubin, Direct: 0.15 mg/dL (ref 0.00–0.40)
Total Protein: 7.7 g/dL (ref 6.0–8.5)

## 2022-11-07 LAB — LIPID PANEL
Chol/HDL Ratio: 4.1 ratio (ref 0.0–5.0)
Cholesterol, Total: 165 mg/dL (ref 100–199)
HDL: 40 mg/dL (ref >39)
LDL Chol Calc (NIH): 85 mg/dL (ref 0–99)
Triglycerides: 239 mg/dL — ABNORMAL HIGH (ref 0–149)
VLDL Cholesterol Cal: 40 mg/dL (ref 5–40)

## 2022-11-19 ENCOUNTER — Other Ambulatory Visit: Payer: Medicare HMO

## 2022-11-19 ENCOUNTER — Encounter: Payer: Self-pay | Admitting: Gastroenterology

## 2022-11-21 ENCOUNTER — Telehealth: Payer: Self-pay | Admitting: Pharmacy Technician

## 2022-11-21 NOTE — Telephone Encounter (Signed)
JANSSEN: re-enrollment. Patient next treatment 12/03/22. Please have patient call Alphonsa Overall to re-verify financial information. Patient may give verbal verification over the phone if patient has not done so already. Phone: [phone]

## 2022-11-22 ENCOUNTER — Other Ambulatory Visit: Payer: Self-pay | Admitting: Cardiology

## 2022-11-25 ENCOUNTER — Other Ambulatory Visit (INDEPENDENT_AMBULATORY_CARE_PROVIDER_SITE_OTHER): Payer: Medicare HMO

## 2022-11-25 DIAGNOSIS — E559 Vitamin D deficiency, unspecified: Secondary | ICD-10-CM | POA: Diagnosis not present

## 2022-11-25 DIAGNOSIS — K50119 Crohn's disease of large intestine with unspecified complications: Secondary | ICD-10-CM

## 2022-11-25 LAB — VITAMIN D 25 HYDROXY (VIT D DEFICIENCY, FRACTURES): VITD: 39.75 ng/mL (ref 30.00–100.00)

## 2022-11-27 ENCOUNTER — Telehealth: Payer: Self-pay | Admitting: Orthopedic Surgery

## 2022-11-27 NOTE — Telephone Encounter (Signed)
Returned the pt's call, he is wanting to schedule an appointment with Dr. Aline Brochure for his right knee.  He would like to discuss possible knee surgery.  He has not had any surgeries on this knee, no ED, but did have x-rays about 6-8 months ago at [facility].  I have asked the patient to get his notes and to get his x-rays on a CD and to please bring them to [place] and we'll have Dr. Aline Brochure review them, then we'll reach out to advise.  Pt's # [phone]

## 2022-11-28 ENCOUNTER — Telehealth: Payer: Self-pay | Admitting: Pharmacy Technician

## 2022-11-28 ENCOUNTER — Telehealth: Payer: Self-pay | Admitting: Gastroenterology

## 2022-11-28 LAB — CALPROTECTIN, FECAL: Calprotectin, Fecal: 56 ug/g (ref 0–120)

## 2022-11-28 NOTE — Telephone Encounter (Signed)
Tyler Gates,  I have faxed re-enrollment forms for 2024 PAP (free drug). Please have MD sign forms and return to me as soon as possible. Forms have been faxed to: [phone].  Thanks Maudie Mercury

## 2022-11-28 NOTE — Telephone Encounter (Signed)
Kim from [facility] infusions is calling states to expect a fax for Dr. Bryan Lemma to sign for patient to be re-enrolled in the free drug program.

## 2022-11-28 NOTE — Telephone Encounter (Signed)
See additional phone note.

## 2022-11-29 LAB — SERIAL MONITORING

## 2022-11-30 LAB — INFLIXIMAB+AB (SERIAL MONITOR)
Anti-Infliximab Antibody: 306 ng/mL
Infliximab Drug Level: 9.8 ug/mL

## 2022-12-02 NOTE — Progress Notes (Signed)
HPI: FU CAD.  Nuclear study August 2019 showed ejection fraction 45% but visually appeared better. Perfusion was normal. Calcium score July 2019 410 which was 85th percentile for age and sex matched control. Echocardiogram May 2023 showed normal LV function, mild left ventricular hypertrophy, grade 1 diastolic dysfunction.  Cardiac CTA June 2023 showed calcium score 538 which was 82nd percentile and mild nonobstructive coronary disease most significant being 25 to 49% in the LAD, D1 and D2.  Since last seen the patient denies any dyspnea on exertion, orthopnea, PND, pedal edema, palpitations, syncope or chest pain.   Current Outpatient Medications  Medication Sig Dispense Refill   amLODipine (NORVASC) 5 MG tablet TAKE 2 TABLETS BY MOUTH EVERY DAY 180 tablet 0   ezetimibe (ZETIA) 10 MG tablet TAKE 1 TABLET BY MOUTH EVERY DAY 90 tablet 2   inFLIXimab (REMICADE) 100 MG injection Remicade 100 mg intravenous solution  INFUSE 400 MG IV ON DAY 1, DAY 14, DAY 42 AND EVERY 8 WEEKS THEREAFTER     loperamide (IMODIUM) 2 MG capsule Take 2 mg by mouth as needed for diarrhea or loose stools.     meloxicam (MOBIC) 15 MG tablet Take 15 mg by mouth daily.     mupirocin ointment (BACTROBAN) 2 % Apply to affected area twice daily for 7 days. Repeat as needed 15 g 0   olmesartan (BENICAR) 20 MG tablet Take 1 tablet (20 mg total) by mouth daily. 90 tablet 3   omeprazole (PRILOSEC) 40 MG capsule TAKE 1 CAPSULE (40 MG TOTAL) BY MOUTH IN THE MORNING AND AT BEDTIME. 180 capsule 3   rosuvastatin (CRESTOR) 20 MG tablet Take 1 tablet (20 mg total) by mouth daily. 90 tablet 3   venlafaxine (EFFEXOR) 37.5 MG tablet TAKE 1 TABLET BY MOUTH TWICE A DAY 180 tablet 1   Vitamin D, Ergocalciferol, (DRISDOL) 1.25 MG (50000 UNIT) CAPS capsule Take 1 capsule (50,000 Units total) by mouth every 7 (seven) days. 8 capsule 0   Current Facility-Administered Medications  Medication Dose Route Frequency Provider Last Rate Last Admin    0.9 %  sodium chloride infusion  500 mL Intravenous Once Cirigliano, Vito V, DO         Past Medical History:  Diagnosis Date   Crohn's disease ([place])    Depression    Dyspnea on exertion    GERD (gastroesophageal reflux disease)    Herpes    History of kidney stones    Hyperlipidemia    Hypertension    Instability of knee joint    Left displaced femoral neck fracture ([place]) 07/11/2017   Snoring     Past Surgical History:  Procedure Laterality Date   COLONOSCOPY  2007   Dr. Sabino Donovan normal TI, ascending colon/cecum with mild inflammation noted but biopses without signs of active inflammation, hyperpastic polyp, possible ischemia on path of left colon biopsy   COLONOSCOPY N/A 11/08/2015   Procedure: COLONOSCOPY;  Surgeon: [name], MD;  [facility];  Service: Endoscopy;  Laterality: N/A;   ESOPHAGOGASTRODUODENOSCOPY  2007   Dr. Sabino Donovan reflux esophagitis, hiatal hernia, negative H.pylori   ESOPHAGOGASTRODUODENOSCOPY N/A 11/08/2015   Procedure: ESOPHAGOGASTRODUODENOSCOPY (EGD);  Surgeon: [name], MD;  [facility];  Service: Endoscopy;  Laterality: N/A;  0830 - moved to 8:15 - office to notify   HIP PINNING,CANNULATED Left 07/12/2017   Procedure: CANNULATED HIP PINNING LEFT;  Surgeon: Rod Can, MD;  [facility]: [facility];  Service: Orthopedics;  Laterality:  Left;    Social History   Socioeconomic History   Marital status: Married    Spouse name: Laster Appling   Number of children: 2   Years of education: 14   Highest education level: Some college, no degree  Occupational History   Occupation: Retired  Tobacco Use   Smoking status: Never   Smokeless tobacco: Never  Vaping Use   Vaping Use: Never used  Substance and Sexual Activity   Alcohol use: Not Currently   Drug use: No   Sexual activity: Not Currently    Birth control/protection: None  Other Topics Concern   Not on file  Social History Narrative   Lives with wife. He has two children.  He enjoys cycling, kayaking and working on model cars.   Social Determinants of Health   Financial Resource Strain: Low Risk  (03/27/2022)   Overall Financial Resource Strain (CARDIA)    Difficulty of Paying Living Expenses: Not hard at all  Food Insecurity: No Food Insecurity (03/27/2022)   Hunger Vital Sign    Worried About Running Out of Food in the Last Year: Never true    Ran Out of Food in the Last Year: Never true  Transportation Needs: No Transportation Needs (03/27/2022)   PRAPARE - Hydrologist (Medical): No    Lack of Transportation (Non-Medical): No  Physical Activity: Sufficiently Active (03/27/2022)   Exercise Vital Sign    Days of Exercise per Week: 3 days    Minutes of Exercise per Session: 60 min  Stress: No Stress Concern Present (03/27/2022)   [place]    Feeling of Stress : Only a little  Social Connections: Socially Isolated (03/27/2022)   Social Connection and Isolation Panel [NHANES]    Frequency of Communication with Friends and Family: Once a week    Frequency of Social Gatherings with Friends and Family: Once a week    Attends Religious Services: Never    Marine scientist or Organizations: No    Attends Archivist Meetings: Never    Marital Status: Married  Human resources officer Violence: Not At Risk (03/27/2022)   Humiliation, Afraid, Rape, and Kick questionnaire    Fear of Current or Ex-Partner: No    Emotionally Abused: No    Physically Abused: No    Sexually Abused: No    Family History  Problem Relation Age of Onset   Melanoma Father    Heart disease Father    Healthy Mother    Colon cancer Neg Hx    Esophageal cancer Neg Hx     ROS: no fevers or chills, productive cough, hemoptysis, dysphasia, odynophagia, melena, hematochezia, dysuria, hematuria, rash, seizure activity, orthopnea, PND, pedal edema, claudication. Remaining systems are  negative.  Physical Exam: Well-developed well-nourished in no acute distress.  Skin is warm and dry.  HEENT is normal.  Neck is supple.  Chest is clear to auscultation with normal expansion.  Cardiovascular exam is regular rate and rhythm.  Abdominal exam nontender or distended. No masses palpated. Extremities show no edema. neuro grossly intact  A/P  1 coronary artery disease-patient denies chest pain.  Continue aspirin and statin.  2 hyperlipidemia-patient appears to be tolerating moderate dose statin.  Will continue Crestor at present dose.  Will also continue Zetia.  He was previously intolerant to other statins.  His most recent LDL was not at goal but much improved.  We discussed options today of increasing  Crestor further.  He would like to be conservative at this point.  Will continue Crestor 20 mg daily and Zetia 10 mg daily.  We will recheck his cholesterol in 6 months.  Will consider increasing then if it remains elevated.  3 hypertension-patient's blood pressure is controlled.  Continue present medications.  Tyler Ruths, MD

## 2022-12-02 NOTE — Telephone Encounter (Signed)
Received- thanks for your help

## 2022-12-03 ENCOUNTER — Ambulatory Visit (INDEPENDENT_AMBULATORY_CARE_PROVIDER_SITE_OTHER): Payer: Medicare HMO

## 2022-12-03 ENCOUNTER — Telehealth: Payer: Self-pay | Admitting: Orthopedic Surgery

## 2022-12-03 VITALS — BP 139/91 | HR 79 | Temp 98.0°F | Resp 18 | Ht 69.0 in | Wt 193.4 lb

## 2022-12-03 DIAGNOSIS — K5010 Crohn's disease of large intestine without complications: Secondary | ICD-10-CM

## 2022-12-03 MED ORDER — METHYLPREDNISOLONE SODIUM SUCC 40 MG IJ SOLR
40.0000 mg | Freq: Once | INTRAMUSCULAR | Status: AC
  Administered 2022-12-03: 40 mg via INTRAVENOUS
  Filled 2022-12-03: qty 1

## 2022-12-03 MED ORDER — SODIUM CHLORIDE 0.9 % IV SOLN
5.0000 mg/kg | Freq: Once | INTRAVENOUS | Status: AC
  Administered 2022-12-03: 400 mg via INTRAVENOUS
  Filled 2022-12-03: qty 40

## 2022-12-03 MED ORDER — DIPHENHYDRAMINE HCL 25 MG PO CAPS
25.0000 mg | ORAL_CAPSULE | Freq: Once | ORAL | Status: AC
  Administered 2022-12-03: 25 mg via ORAL
  Filled 2022-12-03: qty 1

## 2022-12-03 MED ORDER — ACETAMINOPHEN 325 MG PO TABS
650.0000 mg | ORAL_TABLET | Freq: Once | ORAL | Status: AC
  Administered 2022-12-03: 650 mg via ORAL
  Filled 2022-12-03: qty 2

## 2022-12-03 NOTE — Progress Notes (Signed)
Diagnosis: Crohn's Disease  Provider:  Marshell Garfinkel MD  Procedure: Infusion  IV Type: Peripheral, IV Location: L Hand  Remicade (Infliximab), Dose: 400 mg  Infusion Start Time: 3244  Infusion Stop Time: 0102  Post Infusion IV Care: Peripheral IV Discontinued  Discharge: Condition: Good, Destination: Home . AVS provided to patient.   Performed by:  Cleophus Molt, RN

## 2022-12-03 NOTE — Telephone Encounter (Signed)
Spoke w/the patient, he is having the record faxed to [place] and will mail the CD to [place] because he is in [facility].

## 2022-12-11 ENCOUNTER — Ambulatory Visit: Payer: Medicare HMO | Attending: Cardiology | Admitting: Cardiology

## 2022-12-11 ENCOUNTER — Encounter: Payer: Self-pay | Admitting: Cardiology

## 2022-12-11 VITALS — BP 128/84 | HR 89 | Ht 69.0 in | Wt 190.8 lb

## 2022-12-11 DIAGNOSIS — I2510 Atherosclerotic heart disease of native coronary artery without angina pectoris: Secondary | ICD-10-CM | POA: Diagnosis not present

## 2022-12-11 DIAGNOSIS — E785 Hyperlipidemia, unspecified: Secondary | ICD-10-CM

## 2022-12-11 DIAGNOSIS — I10 Essential (primary) hypertension: Secondary | ICD-10-CM | POA: Diagnosis not present

## 2022-12-11 NOTE — Patient Instructions (Signed)
  Follow-Up: At [facility], you and your health needs are our priority.  As part of our continuing mission to provide you with exceptional heart care, we have created designated Provider Care Teams.  These Care Teams include your primary Cardiologist (physician) and Advanced Practice Providers (APPs -  Physician Assistants and Nurse Practitioners) who all work together to provide you with the care you need, when you need it.  We recommend signing up for the patient portal called "MyChart".  Sign up information is provided on this After Visit Summary.  MyChart is used to connect with patients for Virtual Visits (Telemedicine).  Patients are able to view lab/test results, encounter notes, upcoming appointments, etc.  Non-urgent messages can be sent to your provider as well.   To learn more about what you can do with MyChart, go to [email or web address].    Your next appointment:   6 month(s)  The format for your next appointment:   In Person  Provider:   Brian Crenshaw, MD    

## 2022-12-30 ENCOUNTER — Encounter: Payer: Self-pay | Admitting: Orthopedic Surgery

## 2022-12-30 ENCOUNTER — Ambulatory Visit (INDEPENDENT_AMBULATORY_CARE_PROVIDER_SITE_OTHER): Payer: Medicare HMO | Admitting: Orthopedic Surgery

## 2022-12-30 ENCOUNTER — Ambulatory Visit (INDEPENDENT_AMBULATORY_CARE_PROVIDER_SITE_OTHER): Payer: Medicare HMO

## 2022-12-30 VITALS — BP 145/96 | HR 84 | Ht 69.0 in | Wt 196.0 lb

## 2022-12-30 DIAGNOSIS — M1711 Unilateral primary osteoarthritis, right knee: Secondary | ICD-10-CM

## 2022-12-30 DIAGNOSIS — M25561 Pain in right knee: Secondary | ICD-10-CM | POA: Diagnosis not present

## 2022-12-30 DIAGNOSIS — G8929 Other chronic pain: Secondary | ICD-10-CM

## 2022-12-30 NOTE — Progress Notes (Signed)
Chief Complaint  Patient presents with   Knee Pain    Right for about a year and a half    HPI: 67 yo male with some CAD and Chrohns dx on Remicaid seen by Dr Delfino Lovett  presents fro eval right knee   Treatments include various injectx and meloxicam   C/o pain medial side right and  knee pain with walking  He can ride his bike without any discomfort but has trouble shopping such as going to [employer] and grocery shopping  He is interested in possible knee replacement  Past Medical History:  Diagnosis Date   Crohn's disease ([place])    Depression    Dyspnea on exertion    GERD (gastroesophageal reflux disease)    Herpes    History of kidney stones    Hyperlipidemia    Hypertension    Instability of knee joint    Left displaced femoral neck fracture ([place]) 07/11/2017   Snoring     BP (!) 145/96   Pulse 84   Ht '5\' 9"'$  (1.753 m)   Wt 196 lb (88.9 kg)   BMI 28.94 kg/m    General appearance: Well-developed well-nourished no gross deformities  Cardiovascular normal pulse and perfusion normal color without edema  Neurologically no sensation loss or deficits or pathologic reflexes  Psychological: Awake alert and oriented x3 mood and affect normal  Skin no lacerations or ulcerations no nodularity no palpable masses, no erythema or nodularity  Musculoskeletal: Right knee skin looks normal no lesions.  Tenderness medial joint line without effusion.  Range of motion is normal.  He has a varus alignment to the right knee.  Strength is normal  Imaging internal images show a varus knee with grade 4 arthritis but no significant osteophytes around the joint  A/P  End-stage arthritis right knee  Patient is interested in an unloader brace he has a medial unloader brace from [employer] which he liked once he tried on  The plan is for him to call us when he is ready to have the replacement  I will correspond with his GI doctor and cardiologist regarding any preparation that is needed prior  to surgery

## 2023-01-09 ENCOUNTER — Encounter: Payer: Self-pay | Admitting: Gastroenterology

## 2023-01-09 ENCOUNTER — Encounter: Payer: Self-pay | Admitting: Cardiology

## 2023-01-23 ENCOUNTER — Encounter: Payer: Self-pay | Admitting: Orthopedic Surgery

## 2023-01-28 ENCOUNTER — Ambulatory Visit (INDEPENDENT_AMBULATORY_CARE_PROVIDER_SITE_OTHER): Payer: Medicare HMO

## 2023-01-28 VITALS — BP 137/94 | HR 86 | Temp 97.9°F | Resp 16 | Ht 69.0 in | Wt 193.6 lb

## 2023-01-28 DIAGNOSIS — K5010 Crohn's disease of large intestine without complications: Secondary | ICD-10-CM | POA: Diagnosis not present

## 2023-01-28 DIAGNOSIS — M1711 Unilateral primary osteoarthritis, right knee: Secondary | ICD-10-CM | POA: Diagnosis not present

## 2023-01-28 MED ORDER — DIPHENHYDRAMINE HCL 25 MG PO CAPS
25.0000 mg | ORAL_CAPSULE | Freq: Once | ORAL | Status: AC
  Administered 2023-01-28: 25 mg via ORAL
  Filled 2023-01-28: qty 1

## 2023-01-28 MED ORDER — ACETAMINOPHEN 325 MG PO TABS
650.0000 mg | ORAL_TABLET | Freq: Once | ORAL | Status: AC
  Administered 2023-01-28: 650 mg via ORAL
  Filled 2023-01-28: qty 2

## 2023-01-28 MED ORDER — SODIUM CHLORIDE 0.9 % IV SOLN
5.0000 mg/kg | Freq: Once | INTRAVENOUS | Status: AC
  Administered 2023-01-28: 400 mg via INTRAVENOUS
  Filled 2023-01-28: qty 40

## 2023-01-28 MED ORDER — METHYLPREDNISOLONE SODIUM SUCC 40 MG IJ SOLR
40.0000 mg | Freq: Once | INTRAMUSCULAR | Status: AC
  Administered 2023-01-28: 40 mg via INTRAVENOUS
  Filled 2023-01-28: qty 1

## 2023-01-28 NOTE — Progress Notes (Signed)
Diagnosis: Crohn's Disease  Provider:  Marshell Garfinkel MD  Procedure: Infusion  IV Type: Peripheral, IV Location: L Hand  Remicade (Infliximab), Dose: 400 mg  Infusion Start Time: 2297  Infusion Stop Time: 1209  Post Infusion IV Care: Peripheral IV Discontinued  Discharge: Condition: Good, Destination: Home . AVS provided to patient.   Performed by:  Paul Dykes, RN

## 2023-01-29 ENCOUNTER — Telehealth: Payer: Self-pay | Admitting: *Deleted

## 2023-01-29 ENCOUNTER — Encounter: Payer: Self-pay | Admitting: Family Medicine

## 2023-01-29 NOTE — Telephone Encounter (Signed)
   Pre-operative Risk Assessment    Patient Name: Tyler Gates  DOB: 14-Jun-1956 MRN: [id number]      Request for Surgical Clearance    Procedure:   RIGHT TOTAL KNEE ARTHROPLASTY  Date of Surgery:  Clearance TBD                                 Surgeon:  DR. Rod Can Surgeon's Group or Practice Name:  [facility] Phone number:  [phone] ATTN: KERRI MAZE Fax number:  [phone]   Type of Clearance Requested:   - Medical : NO MEDICATIONS LISTED AS NEEDING TO BE HELD   Type of Anesthesia:  Spinal   Additional requests/questions:    Jiles Prows   01/29/2023, 11:39 AM

## 2023-01-29 NOTE — Telephone Encounter (Signed)
Spoke with Panya, to ask if patient needed appointment for preoperative assessment, Panya stated yes, I spoke with patient to inform that appointment scheduled for 02/11/23 is needed in order to complete for, patient expressed understanding, form placed in Dr. Zigmund Daniel box, thanks.

## 2023-01-29 NOTE — Telephone Encounter (Signed)
   Name: Tyler Gates  DOB: 1956/06/07  MRN: [id number]   Primary Cardiologist: Pixie Casino, MD  Chart reviewed as part of pre-operative protocol coverage. Patient was contacted 01/29/2023 in reference to pre-operative risk assessment for pending surgery as outlined below.  Tyler Gates was last seen on 12/11/2022 by Dr. Stanford Breed.  Since that day, Tyler Gates has done well.  He denies any new symptoms or concerns.  He is able to complete greater than 4 METS without difficulty.  Therefore, based on ACC/AHA guidelines, the patient would be at acceptable risk for the planned procedure without further cardiovascular testing.   The patient was advised that if he develops new symptoms prior to surgery to contact our office to arrange for a follow-up visit, and he verbalized understanding.  I will route this recommendation to the requesting party via Epic fax function and remove from pre-op pool. Please call with questions.  Lenna Sciara, NP 01/29/2023, 11:53 AM

## 2023-01-30 ENCOUNTER — Telehealth: Payer: Self-pay

## 2023-01-30 NOTE — Telephone Encounter (Signed)
-----   Message from [place], DO sent at 01/30/2023  2:31 PM EST ----- No. I think good to go w/o a new one. Thanks!  ----- Message ----- From: Marice Potter, RN Sent: 01/30/2023   1:57 PM EST To: Lavena Bullion, DO  I sent myself this message because of vitamin D lab on 11/2 but it looks like lab was accidentally collected on 12/4 with other labs that were ordered. On 12/4 the vitamin D was normal. Does he need another vitamin D level drawn now?  ----- Message ----- From: Marice Potter, RN Sent: 01/28/2023  12:00 AM EST To: Marice Potter, RN  Pt needs vitamin D lab. Order in epic.

## 2023-02-11 ENCOUNTER — Encounter: Payer: Self-pay | Admitting: Family Medicine

## 2023-02-11 ENCOUNTER — Ambulatory Visit (INDEPENDENT_AMBULATORY_CARE_PROVIDER_SITE_OTHER): Payer: Medicare HMO | Admitting: Family Medicine

## 2023-02-11 VITALS — BP 123/80 | HR 88 | Ht 69.0 in | Wt 192.0 lb

## 2023-02-11 DIAGNOSIS — Z01818 Encounter for other preprocedural examination: Secondary | ICD-10-CM | POA: Diagnosis not present

## 2023-02-11 NOTE — Progress Notes (Signed)
Tyler Gates - 67 y.o. male MRN [id number]  Date of birth: Jan 23, 1956  Subjective Chief Complaint  Patient presents with   Pre-op Exam    HPI Tyler Gates is a 67 y.o. male here today for pre-operative examination.  He is planning on having R TKA with Dr. Lyla Glassing.  This will be done in the hospital and the plan is for spinal anesthesia.  Per clearance form from his surgeon applicable labs will be ordered per anesthesia.  He does have history of Crohn's which has been well controlled with Remicade.  He is trying to coordinate his Remicade infusion with his surgery so that he is not significantly immunocompromised prior to surgery.  He does have history of elevated coronary calcium score but denies anginal symptoms.  He does not have dyspnea.  No previous complications with anesthesia.    ROS:  A comprehensive ROS was completed and negative except as noted per HPI  Allergies  Allergen Reactions   Sulfasalazine Other (See Comments)    Flu like feelings   Statins Other (See Comments)    REACTION: Myalgia    Past Medical History:  Diagnosis Date   Crohn's disease ([place])    Depression    Dyspnea on exertion    GERD (gastroesophageal reflux disease)    Herpes    History of kidney stones    Hyperlipidemia    Hypertension    Instability of knee joint    Left displaced femoral neck fracture ([place]) 07/11/2017   Snoring     Past Surgical History:  Procedure Laterality Date   COLONOSCOPY  2007   Dr. Sabino Donovan normal TI, ascending colon/cecum with mild inflammation noted but biopses without signs of active inflammation, hyperpastic polyp, possible ischemia on path of left colon biopsy   COLONOSCOPY N/A 11/08/2015   Procedure: COLONOSCOPY;  Surgeon: [name], MD;  [facility];  Service: Endoscopy;  Laterality: N/A;   ESOPHAGOGASTRODUODENOSCOPY  2007   Dr. Sabino Donovan reflux esophagitis, hiatal hernia, negative H.pylori   ESOPHAGOGASTRODUODENOSCOPY N/A 11/08/2015   Procedure:  ESOPHAGOGASTRODUODENOSCOPY (EGD);  Surgeon: [name], MD;  [facility];  Service: Endoscopy;  Laterality: N/A;  0830 - moved to 8:15 - office to notify   HIP PINNING,CANNULATED Left 07/12/2017   Procedure: CANNULATED HIP PINNING LEFT;  Surgeon: Rod Can, MD;  [facility];  Service: Orthopedics;  Laterality: Left;    Social History   Socioeconomic History   Marital status: Married    Spouse name: Morireoluwa Shippen   Number of children: 2   Years of education: 14   Highest education level: Some college, no degree  Occupational History   Occupation: Retired  Tobacco Use   Smoking status: Never   Smokeless tobacco: Never  Vaping Use   Vaping Use: Never used  Substance and Sexual Activity   Alcohol use: Not Currently   Drug use: No   Sexual activity: Not Currently    Birth control/protection: None  Other Topics Concern   Not on file  Social History Narrative   Lives with wife. He has two children. He enjoys cycling, kayaking and working on model cars.   Social Determinants of Health   Financial Resource Strain: Low Risk  (03/27/2022)   Overall Financial Resource Strain (CARDIA)    Difficulty of Paying Living Expenses: Not hard at all  Food Insecurity: No Food Insecurity (03/27/2022)   Hunger Vital Sign    Worried About Running Out of Food in the Last  Year: Never true    [place] in the Last Year: Never true  Transportation Needs: No Transportation Needs (03/27/2022)   PRAPARE - Hydrologist (Medical): No    Lack of Transportation (Non-Medical): No  Physical Activity: Sufficiently Active (03/27/2022)   Exercise Vital Sign    Days of Exercise per Week: 3 days    Minutes of Exercise per Session: 60 min  Stress: No Stress Concern Present (03/27/2022)   [place]    Feeling of Stress : Only a little  Social Connections: Socially Isolated (03/27/2022)   Social  Connection and Isolation Panel [NHANES]    Frequency of Communication with Friends and Family: Once a week    Frequency of Social Gatherings with Friends and Family: Once a week    Attends Religious Services: Never    Marine scientist or Organizations: No    Attends Music therapist: Never    Marital Status: Married    Family History  Problem Relation Age of Onset   Melanoma Father    Heart disease Father    Healthy Mother    Colon cancer Neg Hx    Esophageal cancer Neg Hx     Health Maintenance  Topic Date Due   DTaP/Tdap/Td (1 - Tdap) Never done   Medicare Annual Wellness (AWV)  03/28/2023   Pneumonia Vaccine 60+ Years old (1 of 1 - PCV) 03/13/2023 (Originally 11/17/2020)   COVID-19 Vaccine (4 - 2023-24 season) 02/27/2024 (Originally 11/17/2022)   Zoster Vaccines- Shingrix (1 of 2) 05/11/2024 (Originally 11/17/2005)   COLONOSCOPY (Pts 45-73yr Insurance coverage will need to be confirmed)  05/24/2030   INFLUENZA VACCINE  Completed   Hepatitis C Screening  Completed   HPV VACCINES  Aged Out     ----------------------------------------------------------------------------------------------------------------------------------------------------------------------------------------------------------------- Physical Exam BP 123/80 (BP Location: Left Arm, Patient Position: Sitting, Cuff Size: Normal)   Pulse 88   Ht 5' 9"$  (1.753 m)   Wt 192 lb (87.1 kg)   SpO2 95%   BMI 28.35 kg/m   Physical Exam Constitutional:      Appearance: Normal appearance.  HENT:     Head: Normocephalic and atraumatic.  Eyes:     General: No scleral icterus. Cardiovascular:     Rate and Rhythm: Normal rate and regular rhythm.  Pulmonary:     Effort: Pulmonary effort is normal.     Breath sounds: Normal breath sounds.  Musculoskeletal:     Cervical back: Neck supple.  Neurological:     Mental Status: He is alert.  Psychiatric:        Mood and Affect: Mood normal.         Behavior: Behavior normal.     ------------------------------------------------------------------------------------------------------------------------------------------------------------------------------------------------------------------- Assessment and Plan  Preoperative clearance RCRI with score of 0.  Low risk for cardiac complications with orthopedic surgery.  I think he is medically optimized for surgery.  Will hold off on labs at this time per clearance form these will be completed at his preop with anesthesia.   No orders of the defined types were placed in this encounter.   No follow-ups on file.    This visit occurred during the SARS-CoV-2 public health emergency.  Safety protocols were in place, including screening questions prior to the visit, additional usage of staff PPE, and extensive cleaning of exam room while observing appropriate contact time as indicated for disinfecting solutions.

## 2023-02-11 NOTE — Assessment & Plan Note (Signed)
RCRI with score of 0.  Low risk for cardiac complications with orthopedic surgery.  I think he is medically optimized for surgery.  Will hold off on labs at this time per clearance form these will be completed at his preop with anesthesia.

## 2023-02-12 DIAGNOSIS — H2513 Age-related nuclear cataract, bilateral: Secondary | ICD-10-CM | POA: Diagnosis not present

## 2023-02-12 DIAGNOSIS — H524 Presbyopia: Secondary | ICD-10-CM | POA: Diagnosis not present

## 2023-02-18 ENCOUNTER — Encounter: Payer: Self-pay | Admitting: Gastroenterology

## 2023-02-20 ENCOUNTER — Encounter: Payer: Self-pay | Admitting: Radiology

## 2023-02-23 ENCOUNTER — Other Ambulatory Visit: Payer: Self-pay | Admitting: Cardiology

## 2023-02-24 ENCOUNTER — Encounter: Payer: Self-pay | Admitting: Gastroenterology

## 2023-03-05 DIAGNOSIS — M1711 Unilateral primary osteoarthritis, right knee: Secondary | ICD-10-CM | POA: Diagnosis not present

## 2023-03-07 ENCOUNTER — Telehealth: Payer: Self-pay

## 2023-03-07 ENCOUNTER — Encounter: Payer: Self-pay | Admitting: Gastroenterology

## 2023-03-07 NOTE — Telephone Encounter (Signed)
Received a call from [employer]. (specialist/technician) from [facility]. I spent 20 minutes on the phone with Cecille Rubin to attempt to clarify some discrepancies for the PAP assistance and approval. Cecille Rubin states that they have the patient's name inverted in their system, which is incorrect. They received a call from [place], who I believe works at the infusion center on [address] per previous PAP assistance form. I was informed that Hassan Rowan call today and gave a verbal order to a pharmacist for Remicade RX with the reversed name so now they are confused. I advised Cecille Rubin to contact the infusion center as well and ask for a supervisor. I explained to Cecille Rubin several times that patients name is Kenard Boeder, PAP application from [id number] has name correct but the approval that was received on 01/06/23 has the name reversed again. I explained to Cecille Rubin that typically once renewal prescription is due our physician will sign the order and we fax it back to them, not sure why a verbal order was given today. I told Cecille Rubin that I am not sure what has been going on. Cecille Rubin took all of this information down and will inform her supervisor so that this error can be fixed.

## 2023-03-25 ENCOUNTER — Ambulatory Visit (INDEPENDENT_AMBULATORY_CARE_PROVIDER_SITE_OTHER): Payer: Medicare HMO

## 2023-03-25 VITALS — BP 137/84 | HR 77 | Temp 97.7°F | Resp 18 | Wt 190.0 lb

## 2023-03-25 DIAGNOSIS — K5010 Crohn's disease of large intestine without complications: Secondary | ICD-10-CM

## 2023-03-25 MED ORDER — METHYLPREDNISOLONE SODIUM SUCC 40 MG IJ SOLR
40.0000 mg | Freq: Once | INTRAMUSCULAR | Status: AC
  Administered 2023-03-25: 40 mg via INTRAVENOUS
  Filled 2023-03-25: qty 1

## 2023-03-25 MED ORDER — ACETAMINOPHEN 325 MG PO TABS
650.0000 mg | ORAL_TABLET | Freq: Once | ORAL | Status: AC
  Administered 2023-03-25: 650 mg via ORAL
  Filled 2023-03-25: qty 2

## 2023-03-25 MED ORDER — SODIUM CHLORIDE 0.9 % IV SOLN
5.0000 mg/kg | Freq: Once | INTRAVENOUS | Status: AC
  Administered 2023-03-25: 400 mg via INTRAVENOUS
  Filled 2023-03-25: qty 40

## 2023-03-25 MED ORDER — DIPHENHYDRAMINE HCL 25 MG PO CAPS
25.0000 mg | ORAL_CAPSULE | Freq: Once | ORAL | Status: AC
  Administered 2023-03-25: 25 mg via ORAL
  Filled 2023-03-25: qty 1

## 2023-03-25 NOTE — Progress Notes (Signed)
Diagnosis: Crohn's Disease  Provider:  Marshell Garfinkel MD  Procedure: Infusion  IV Type: Peripheral, IV Location: L Hand  Remicade (Infliximab), Dose: 400 mg  Infusion Start Time: [id number]  Infusion Stop Time: 1200  Post Infusion IV Care: Peripheral IV Discontinued  Discharge: Condition: Good, Destination: Home . AVS Declined  Performed by:  Arnoldo Morale, RN

## 2023-03-29 ENCOUNTER — Other Ambulatory Visit: Payer: Self-pay | Admitting: Family Medicine

## 2023-04-08 ENCOUNTER — Ambulatory Visit: Payer: Self-pay | Admitting: Student

## 2023-04-08 NOTE — Progress Notes (Signed)
Surgery orders requested via Epic inbox. °

## 2023-04-09 NOTE — Progress Notes (Addendum)
Anesthesia Review:  PCP: Everrett Coombe LOV  02/11/23 on chart  Clearance dated 02/11/23 on chart  Cardiologist : DR  Olga Millers  LOV 12/11/22  Cherlyn Cushing, NP Clearance 01/29/23 on chart  Chest x-ray : EKG : 05/08/22  CT cors- 05/30/22  Echo : 05/15/22  Stress test: 2019  Cardiac Cath :  Activity level: can do a flight of stairs without difficutly  Sleep Study/ CPAP : hx of sleep test that was negative per pt  Fasting Blood Sugar :      / Checks Blood Sugar -- times a day:   Blood Thinner/ Instructions /Last Dose: ASA / Instructions/ Last Dose :    PT blood pressure slightly elevated at preop .  Diastolic was 102.  PT denis any chest pain, shortness of breath , dizziness or headache.  Instructed pt to monitor blood pressure readings at home and to notify PCP if B/P remains elevated.

## 2023-04-09 NOTE — Patient Instructions (Signed)
SURGICAL WAITING ROOM VISITATION  Patients having surgery or a procedure may have no more than 2 support people in the waiting area - these visitors may rotate.    Children under the age of 59 must have an adult with them who is not the patient.  Due to an increase in RSV and influenza rates and associated hospitalizations, children ages 33 and under may not visit patients in [facility] hospitals.  If the patient needs to stay at the hospital during part of their recovery, the visitor guidelines for inpatient rooms apply. Pre-op nurse will coordinate an appropriate time for 1 support person to accompany patient in pre-op.  This support person may not rotate.    Please refer to the [facility] website for the visitor guidelines for Inpatients (after your surgery is over and you are in a regular room).       Your procedure is scheduled on:  04/23/23    Report to [facility] Main Entrance    Report to admitting at  0600 am   Call this number if you have problems the morning of surgery [phone]   Do not eat food :After Midnight.   After Midnight you may have the following liquids until _ 0530_____ AM DAY OF SURGERY  Water Non-Citrus Juices (without pulp, NO RED-Apple, White grape, White cranberry) Black Coffee (NO MILK/CREAM OR CREAMERS, sugar ok)  Clear Tea (NO MILK/CREAM OR CREAMERS, sugar ok) regular and decaf                             Plain Jell-O (NO RED)                                           Fruit ices (not with fruit pulp, NO RED)                                     Popsicles (NO RED)                                                               Sports drinks like Gatorade (NO RED)                     The day of surgery:  Drink ONE (1) Pre-Surgery Clear Ensure or G2 at  0530 AM  ( have completed by ) the morning of surgery. Drink in one sitting. Do not sip.  This drink was given to you during your hospital  pre-op appointment visit. Nothing else to drink  after completing the  Pre-Surgery Clear Ensure or G2.          If you have questions, please contact your surgeon's office.        Oral Hygiene is also important to reduce your risk of infection.                                    Remember - BRUSH YOUR TEETH THE MORNING OF  SURGERY WITH YOUR REGULAR TOOTHPASTE  DENTURES WILL BE REMOVED PRIOR TO SURGERY PLEASE DO NOT APPLY "Poly grip" OR ADHESIVES!!!   Do NOT smoke after Midnight   Take these medicines the morning of surgery with A SIP OF WATER:   DO NOT TAKE ANY ORAL DIABETIC MEDICATIONS DAY OF YOUR SURGERY  Bring CPAP mask and tubing day of surgery.                              You may not have any metal on your body including hair pins, jewelry, and body piercing             Do not wear make-up, lotions, powders, perfumes/cologne, or deodorant  Do not wear nail polish including gel and S&S, artificial/acrylic nails, or any other type of covering on natural nails including finger and toenails. If you have artificial nails, gel coating, etc. that needs to be removed by a nail salon please have this removed prior to surgery or surgery may need to be canceled/ delayed if the surgeon/ anesthesia feels like they are unable to be safely monitored.   Do not shave  48 hours prior to surgery.               Men may shave face and neck.   Do not bring valuables to the hospital. [place] IS NOT             RESPONSIBLE   FOR VALUABLES.   Contacts, glasses, dentures or bridgework may not be worn into surgery.   Bring small overnight bag day of surgery.   DO NOT BRING YOUR HOME MEDICATIONS TO THE HOSPITAL. PHARMACY WILL DISPENSE MEDICATIONS LISTED ON YOUR MEDICATION LIST TO YOU DURING YOUR ADMISSION IN THE HOSPITAL!    Patients discharged on the day of surgery will not be allowed to drive home.  Someone NEEDS to stay with you for the first 24 hours after anesthesia.   Special Instructions: Bring a copy of your healthcare power of  attorney and living will documents the day of surgery if you haven't scanned them before.              Please read over the following fact sheets you were given: IF YOU HAVE QUESTIONS ABOUT YOUR PRE-OP INSTRUCTIONS PLEASE CALL [phone]   If you received a COVID test during your pre-op visit  it is requested that you wear a mask when out in public, stay away from anyone that may not be feeling well and notify your surgeon if you develop symptoms. If you test positive for Covid or have been in contact with anyone that has tested positive in the last 10 days please notify you surgeon.

## 2023-04-15 ENCOUNTER — Other Ambulatory Visit: Payer: Self-pay

## 2023-04-15 ENCOUNTER — Encounter (HOSPITAL_COMMUNITY)
Admission: RE | Admit: 2023-04-15 | Discharge: 2023-04-15 | Disposition: A | Discharge status: Home / Self Care | Payer: Medicare HMO | Source: Ambulatory Visit | Attending: Orthopedic Surgery | Admitting: Orthopedic Surgery

## 2023-04-15 ENCOUNTER — Encounter (HOSPITAL_COMMUNITY): Payer: Self-pay

## 2023-04-15 VITALS — BP 133/102 | HR 78 | Temp 97.8°F | Resp 16 | Ht 69.0 in | Wt 187.0 lb

## 2023-04-15 DIAGNOSIS — M1711 Unilateral primary osteoarthritis, right knee: Secondary | ICD-10-CM | POA: Insufficient documentation

## 2023-04-15 DIAGNOSIS — I2510 Atherosclerotic heart disease of native coronary artery without angina pectoris: Secondary | ICD-10-CM | POA: Insufficient documentation

## 2023-04-15 DIAGNOSIS — Z01812 Encounter for preprocedural laboratory examination: Secondary | ICD-10-CM | POA: Insufficient documentation

## 2023-04-15 DIAGNOSIS — I10 Essential (primary) hypertension: Secondary | ICD-10-CM | POA: Diagnosis not present

## 2023-04-15 DIAGNOSIS — Z01818 Encounter for other preprocedural examination: Secondary | ICD-10-CM

## 2023-04-15 DIAGNOSIS — K219 Gastro-esophageal reflux disease without esophagitis: Secondary | ICD-10-CM | POA: Insufficient documentation

## 2023-04-15 DIAGNOSIS — K5090 Crohn's disease, unspecified, without complications: Secondary | ICD-10-CM | POA: Diagnosis not present

## 2023-04-15 HISTORY — DX: Unspecified osteoarthritis, unspecified site: M19.90

## 2023-04-15 LAB — BASIC METABOLIC PANEL
Anion gap: 9 (ref 5–15)
BUN: 21 mg/dL (ref 8–23)
CO2: 26 mmol/L (ref 22–32)
Calcium: 9.4 mg/dL (ref 8.9–10.3)
Chloride: 104 mmol/L (ref 98–111)
Creatinine, Ser: 1.35 mg/dL — ABNORMAL HIGH (ref 0.61–1.24)
GFR, Estimated: 58 mL/min — ABNORMAL LOW (ref >60)
Glucose, Bld: 88 mg/dL (ref 70–99)
Potassium: 4.5 mmol/L (ref 3.5–5.1)
Sodium: 139 mmol/L (ref 135–145)

## 2023-04-15 LAB — CBC
HCT: 46.7 % (ref 39.0–52.0)
Hemoglobin: 15.7 g/dL (ref 13.0–17.0)
MCH: 30.3 pg (ref 26.0–34.0)
MCHC: 33.6 g/dL (ref 30.0–36.0)
MCV: 90.2 fL (ref 80.0–100.0)
Platelets: 234 10*3/uL (ref 150–400)
RBC: 5.18 MIL/uL (ref 4.22–5.81)
RDW: 12.3 % (ref 11.5–15.5)
WBC: 6.1 10*3/uL (ref 4.0–10.5)
nRBC: 0 % (ref 0.0–0.2)

## 2023-04-15 LAB — SURGICAL PCR SCREEN
MRSA, PCR: NEGATIVE
Staphylococcus aureus: NEGATIVE

## 2023-04-16 ENCOUNTER — Encounter: Payer: Self-pay | Admitting: Gastroenterology

## 2023-04-16 ENCOUNTER — Ambulatory Visit: Payer: Medicare HMO | Admitting: Gastroenterology

## 2023-04-16 VITALS — BP 120/84 | HR 88 | Ht 69.0 in | Wt 193.0 lb

## 2023-04-16 DIAGNOSIS — D84821 Immunodeficiency due to drugs: Secondary | ICD-10-CM

## 2023-04-16 DIAGNOSIS — Z79899 Other long term (current) drug therapy: Secondary | ICD-10-CM | POA: Diagnosis not present

## 2023-04-16 DIAGNOSIS — K50119 Crohn's disease of large intestine with unspecified complications: Secondary | ICD-10-CM

## 2023-04-16 DIAGNOSIS — K219 Gastro-esophageal reflux disease without esophagitis: Secondary | ICD-10-CM

## 2023-04-16 NOTE — Progress Notes (Signed)
Case: [id number] Date/Time: 04/23/23 0815   Procedure: COMPUTER ASSISTED TOTAL KNEE ARTHROPLASTY (Right: Knee) - 160   Anesthesia type: Spinal   Pre-op diagnosis: Right knee osteoarthritis   Location: [facility] ROOM 08 / [facility]   Surgeons: Samson Frederic, MD       DISCUSSION: Jasmon Graffam is a 67 yo male who presents to PAT clinic prior to R TKA. PMH significant for arthritis, Crohn's disease, GERD, CAD (medically managed), HTN.  Patient saw his PCP on 02/11/23 for pre-surgical clearance:  "RCRI with score of 0.  Low risk for cardiac complications with orthopedic surgery.  I think he is medically optimized for surgery.  Will hold off on labs at this time per clearance form these will be completed at his preop with anesthesia."  Patient also sees Cardiology for CAD which was seen on CT. He is on ASA and statin for this. Denies CP/SOB at PAT visit. Cardiac clearance provided on 01/29/23:  "Chart reviewed as part of pre-operative protocol coverage. Patient was contacted 01/29/2023 in reference to pre-operative risk assessment for pending surgery as outlined below.  KEAGEN HEINLEN was last seen on 12/11/2022 by Dr. Jens Som.  Since that day, LUDGER BONES has done well.  He denies any new symptoms or concerns.  He is able to complete greater than 4 METS without difficulty.   Therefore, based on ACC/AHA guidelines, the patient would be at acceptable risk for the planned procedure without further cardiovascular testing."  VS: BP (!) 133/102   Pulse 78   Temp 36.6 C (Oral)   Resp 16   Ht  (1.753 m)   Wt 84.8 kg   SpO2 99%   BMI 27.62 kg/m   PROVIDERS: Everrett Coombe, DO Cardiologist: Dr. Olga Millers  LABS: Labs reviewed: Acceptable for surgery. (all labs ordered are listed, but only abnormal results are displayed)  Labs Reviewed  BASIC METABOLIC PANEL - Abnormal; Notable for the following components:      Result Value   Creatinine, Ser 1.35 (*)    GFR, Estimated 58 (*)    All  other components within normal limits  SURGICAL PCR SCREEN  CBC     IMAGES:  CT Coronary 05/30/22:  IMPRESSION: 1. Coronary calcium score of 538. This was 44 percentile for age-, sex, and race-matched controls.   2. Normal coronary origin with right dominance.   3. Mild (25-49) CAD in the LAD, D1 and D2.   4. Aortic atherosclerosis.   RECOMMENDATIONS: CAD-RADS 2: Mild non-obstructive CAD (25-49%). Consider non-atherosclerotic causes of chest pain. Consider preventive therapy and risk factor modification.  EKG 05/08/22:  Sinus rhythm with PACs Incomplete RBBB   CV:  Echo 05/15/22:  IMPRESSIONS     1. Left ventricular ejection fraction, by estimation, is 55 to 60%. The  left ventricle has normal function. The left ventricle has no regional  wall motion abnormalities. There is mild asymmetric left ventricular  hypertrophy of the septal segment. Left  ventricular diastolic parameters are consistent with Grade I diastolic  dysfunction (impaired relaxation).   2. Right ventricular systolic function is low normal. The right  ventricular size is normal. There is normal pulmonary artery systolic  pressure. The estimated right ventricular systolic pressure is 18.2 mmHg.   3. The mitral valve is grossly normal. Trivial mitral valve  regurgitation.   4. The aortic valve is tricuspid. There is mild calcification of the  aortic valve. Aortic valve regurgitation is not visualized.   5. The inferior vena cava  is normal in size with greater than 50%  respiratory variability, suggesting right atrial pressure of 3 mmHg.   Stress test 08/19/18:  Nuclear stress EF: 45%. The left ventricular ejection fraction is mildly decreased (45-54%). Visually, the LV function appears to be greater than the 45% computed . Would consider echo for better assessment of LV function . There was no ST segment deviation noted during stress. The study is normal. There is no evidence of ischemia This is a  low risk study  Comparison(s): No prior Echocardiogram.   Past Medical History:  Diagnosis Date   Arthritis    Crohn's disease    Depression    Dyspnea on exertion    GERD (gastroesophageal reflux disease)    Herpes    History of kidney stones    Hyperlipidemia    Hypertension    Instability of knee joint    Left displaced femoral neck fracture 07/11/2017   Snoring     Past Surgical History:  Procedure Laterality Date   COLONOSCOPY  2007   Dr. Ozzie Hoyle normal TI, ascending colon/cecum with mild inflammation noted but biopses without signs of active inflammation, hyperpastic polyp, possible ischemia on path of left colon biopsy   COLONOSCOPY N/A 11/08/2015   Procedure: COLONOSCOPY;  Surgeon: Corbin Ade, MD;  [facility];  Service: Endoscopy;  Laterality: N/A;   ESOPHAGOGASTRODUODENOSCOPY  2007   Dr. Ozzie Hoyle reflux esophagitis, hiatal hernia, negative H.pylori   ESOPHAGOGASTRODUODENOSCOPY N/A 11/08/2015   Procedure: ESOPHAGOGASTRODUODENOSCOPY (EGD);  Surgeon: Corbin Ade, MD;  [facility];  Service: Endoscopy;  Laterality: N/A;  0830 - moved to 8:15 - office to notify   HIP PINNING,CANNULATED Left 07/12/2017   Procedure: CANNULATED HIP PINNING LEFT;  Surgeon: Samson Frederic, MD;  Location[facility];  Service: Orthopedics;  Laterality: Left;    MEDICATIONS:  amLODipine (NORVASC) 5 MG tablet   Cholecalciferol (VITAMIN D3 PO)   ezetimibe (ZETIA) 10 MG tablet   inFLIXimab (REMICADE) 100 MG injection   loperamide (IMODIUM) 2 MG capsule   meloxicam (MOBIC) 15 MG tablet   mupirocin ointment (BACTROBAN) 2 %   olmesartan (BENICAR) 20 MG tablet   omeprazole (PRILOSEC) 40 MG capsule   rosuvastatin (CRESTOR) 20 MG tablet   venlafaxine (EFFEXOR) 37.5 MG tablet    0.9 %  sodium chloride infusion    Marcille Blanco [facility] Surgical Short Stay/Anesthesiology [facility] Phone [phone] 04/16/2023 4:12 PM

## 2023-04-16 NOTE — Patient Instructions (Addendum)
You are due for your labs in 5 months. We will send you a reminder when it is time.  Please follow up in 6 to 12 months. Give [place] a call at [phone] to schedule an appointment.    _______________________________________________________  If your blood pressure at your visit was 140/90 or greater, please contact your primary care physician to follow up on this.  _______________________________________________________  If you are age 68 or older, your body mass index should be between 23-30. Your Body mass index is 28.5 kg/m. If this is out of the aforementioned range listed, please consider follow up with your Primary Care Provider.  __________________________________________________________  The [facility] providers would like to encourage you to use [facility] to communicate with providers for non-urgent requests or questions.  Due to long hold times on the telephone, sending your provider a message by [facility] may be a faster and more efficient way to get a response.  Please allow 48 business hours for a response.  Please remember that this is for non-urgent requests.    Due to recent changes in healthcare laws, you may see the results of your imaging and laboratory studies on MyChart before your provider has had a chance to review them.  We understand that in some cases there may be results that are confusing or concerning to you. Not all laboratory results come back in the same time frame and the provider may be waiting for multiple results in order to interpret others.  Please give [place] 48 hours in order for your provider to thoroughly review all the results before contacting the office for clarification of your results.     Thank you for choosing me and [facility].  Vito Cirigliano, D.O.

## 2023-04-16 NOTE — Progress Notes (Unsigned)
Chief Complaint:    Crohn's Disease  GI History: 67 year old male with a history of CKD (creatinine ~1.3), GERD, HTN, HLD, follows in the GI clinic for Crohn's Colitis and GERD.   1) Crohn's Disease: longstanding history of steroid responsive Crohn's colitis, previously followed by Dr. Jena Gauss at [facility]. Was treated with budesonide in 10/2019. Restarted Lialda 4.8 g/day in 03/2020 for breakthrough symptoms with good clinical response.  Had breakthrough when weaning to 2.4 g/day.  Responded with slight increase to 3.6 g/day.  Active colitis on colonoscopy in 05/2020, but patient preferred continued Lialda rather than escalation of therapy.  Had issues obtaining Lialda due to insurance.  Breakthrough with Apriso and Entocort.  Good response to repeat trial of Uceris, but no durable relief.  Finally able to get back on Lialda in 07/2021.  Continued breakthrough symptoms despite high-dose Lialda and eventually transition to Remicade in 04/2022 (Humira was unaffordable and did not receive assistance through [employer] assist). - ESR/CRP not reliably elevated during flare - Fecal calprotectin will elevate with flares - Has a sulfa allergy-cannot do sulfasalazine   He is an avid mountain biker.     IBD History:  Diagnosed in his 20's Index symptoms: Abdominal cramping, loose, nonbloody stools. No UGI sxs. No nocturnal stools or tenesmus.    Evaluation to date: - TPMT: N/A - TB testing: Negative QuantiFERON gold - HBV status: Negative.  Completed hepatitis A/B vaccine series - Pertinent Imaging: None for review - Last colonoscopy:  05/2020 - Small bowel imaging: None - History of EIMs: None, although does describe some vague skin rash on back that improves with steroids.  Has seen Dermatology   Current medication: Remicade Previous medications: Budesonide ER, Pentasa, Apriso, Lialda, Azulfidine, prednisone.     Health Maintenance: - DEXA: Declined - Vaccinations:      - Annual Flu Vaccine  -UTD      - Pneumococcal Vaccine if receiving immunosuppression: Prevnar 20 with PCM      - Zoster vaccine if over age 71: Obtained through [facility] - Micronutrient eval:      - Annual Vit D, B6, iron panel: Up-to-date      - Ileal disease: B12, fat soluble vitamins: Up-to-date - Surveillance colonoscopy: Will schedule for late 2024/early 2025 as below - Surveillance labs for immunomodulators: N/A - Annual depression screening: None - Annual Dermatology/Skin exam: Has seen previously.  Needs to schedule annual exam   Endoscopic Hx: -Colonoscopy (05/2020, Dr. Barron Alvine): Moderate, active colitis (erythema, edema, aphthous ulcers) from transverse colon to cecum.  Mild inflammation in the left colon in a patchy distribution with rectal sparing.  Sigmoid diverticulosis.  Internal hemorrhoids.  Normal TI.  Benign rectal hyperplastic polyps -Colonoscopy (10/2015, Dr. Jena Gauss): Mild inflammatory changes, mild to moderate chronic colitis predominantly on the right sided endoscopically, for biopsies throughout colon with Crohn's disease, aside from rectal sparing.  Normal TI -EGD (02/01/2015, Dr. Jena Gauss): 1 cm salmon-colored tongue x2 (biopsy: Gastritis), patulous EG junction with 2-3 cm HH, normal stomach and duodenum -Colonoscopy (2007, Dr. Ozzie Hoyle): Normal TI, ascending colon/cecum mild inflammation but biopsies without active inflammation, hyperplastic polyp, possible ischemia on path left colon biopsy   2) GERD: History of GERD and hiatal hernia.  Reflux symptoms well controlled on Nexium 40 mg BID.  Was previously treated with omeprazole 40 mg bid.    HPI:     Patient is a 67 y.o. male presenting to the Gastroenterology Clinic for routine follow-up.  Last seen by me on 10/23/2022.  Was  having some breakthrough symptoms.  - 10/24/2022: Fecal calprotectin elevated at 343, but significantly down from previous (1177). Vitamin D was low; started on ergocalciferol.  Otherwise normal B6, B12, folate. - 11/25/2022:  Infliximab trough 9.8, antibody 306.  Fecal calprotectin normalized to 56.  Discussed (and ordered) Imuran for dual purpose of dual immunosuppressive therapy and immunogenicity, but he ultimately did not start this medication  Today, he states he feels well.  No recent flares of Crohn's symptoms. Last infusion was 03/25/23.   Has knee replacement scheduled next week.  Will have 4 weeks of healing time prior to his next infliximab infusion.       Latest Ref Rng & Units 04/15/2023    9:04 AM 12/13/2021    3:24 PM 07/23/2021    2:48 PM  CBC  WBC 4.0 - 10.5 K/uL 6.1  10.1  9.1   Hemoglobin 13.0 - 17.0 g/dL 16.1  09.6  04.5   Hematocrit 39.0 - 52.0 % 46.7  41.7  40.2   Platelets 150 - 400 K/uL 234  321.0  290.0       Latest Ref Rng & Units 04/15/2023    9:04 AM 05/17/2022   10:41 AM 12/13/2021    3:24 PM  BMP  Glucose 70 - 99 mg/dL 88  409  79   BUN 8 - 23 mg/dL 21  16  23    Creatinine 0.61 - 1.24 mg/dL 8.11  9.14  7.82   BUN/Creat Ratio 10 - 24  13    Sodium 135 - 145 mmol/L 139  139  136   Potassium 3.5 - 5.1 mmol/L 4.5  4.3  4.4   Chloride 98 - 111 mmol/L 104  101  102   CO2 22 - 32 mmol/L 26  22  28    Calcium 8.9 - 10.3 mg/dL 9.4  9.2  9.5       Review of systems:     No chest pain, no SOB, no fevers, no urinary sx   Past Medical History:  Diagnosis Date   Arthritis    Crohn's disease    Depression    Dyspnea on exertion    GERD (gastroesophageal reflux disease)    Herpes    History of kidney stones    Hyperlipidemia    Hypertension    Instability of knee joint    Left displaced femoral neck fracture 07/11/2017   Snoring     Patient's surgical history, family medical history, social history, medications and allergies were all reviewed in Epic    Current Outpatient Medications  Medication Sig Dispense Refill   amLODipine (NORVASC) 5 MG tablet TAKE 2 TABLETS BY MOUTH EVERY DAY (Patient taking differently: Take 7.5 mg by mouth daily.) 180 tablet 0   Cholecalciferol  (VITAMIN D3 PO) Take 1 tablet by mouth daily.     ezetimibe (ZETIA) 10 MG tablet TAKE 1 TABLET BY MOUTH EVERY DAY 90 tablet 2   inFLIXimab (REMICADE) 100 MG injection Remicade 100 mg intravenous solution  INFUSE 400 MG IV ON DAY 1, DAY 14, DAY 42 AND EVERY 8 WEEKS THEREAFTER     loperamide (IMODIUM) 2 MG capsule Take 2 mg by mouth as needed for diarrhea or loose stools.     meloxicam (MOBIC) 15 MG tablet Take 15 mg by mouth daily.     mupirocin ointment (BACTROBAN) 2 % Apply to affected area twice daily for 7 days. Repeat as needed (Patient taking differently: Apply 1 Application topically daily as needed (wound  treatment).) 15 g 0   olmesartan (BENICAR) 20 MG tablet Take 1 tablet (20 mg total) by mouth daily. 90 tablet 3   omeprazole (PRILOSEC) 40 MG capsule TAKE 1 CAPSULE (40 MG TOTAL) BY MOUTH IN THE MORNING AND AT BEDTIME. 180 capsule 3   rosuvastatin (CRESTOR) 20 MG tablet Take 1 tablet (20 mg total) by mouth daily. 90 tablet 3   venlafaxine (EFFEXOR) 37.5 MG tablet TAKE 1 TABLET BY MOUTH TWICE A DAY 180 tablet 1   Current Facility-Administered Medications  Medication Dose Route Frequency Provider Last Rate Last Admin   0.9 %  sodium chloride infusion  500 mL Intravenous Once Sonna Lipsky V, DO        Physical Exam:     BP 120/84   Pulse 88   Ht  (1.753 m)   Wt 193 lb (87.5 kg)   BMI 28.50 kg/m   GENERAL:  Pleasant male in NAD PSYCH: : Cooperative, normal affect Musculoskeletal:  Normal muscle tone, normal strength NEURO: Alert and oriented x 3, no focal neurologic deficits   IMPRESSION and PLAN:    1) Crohn's colitis 67 year old male with longstanding history of steroid responsive Crohn's colitis.  Previously responsive to Lialda, but eventually lost efficacy, and transitioned to Remicade in 04/2022 with good response.    Did have some breakthrough type symptoms in fall 2023 with fecal calprotectin 343 (although significantly improved from previous), good  infliximab trough but antibodies present at 306.  Symptoms ultimately have improved and disease has been quiescent with normalization of fecal calprotectin in 11/2022.  We again discussed adding Imuran for both dual immunosuppressive therapy along with immunogenicity.  He is interested in this option, but does not want to start right now, particularly with knee replacement surgery scheduled for next week.  He would like to continue course of infliximab monotherapy with proactive drug monitoring as outlined below:  - Continue Remicade as scheduled - If delayed healing after knee replacement, may need to delay Remicade infusion by 1 week - Check infliximab trough and antibody level in 5 months (check 1-2 weeks prior to that infusion).  If antibodies uptrending, will again discuss starting Imuran vs changing therapy depending on lab results - Schedule follow-up appointment with his Dermatologist for annual skin check - Never completed DEXA scan.  Will discuss at follow-up - Plan for colonoscopy late 2024 or early 2025 to assess for deep remission - UTD on vaccines and micronutrient check  2) GERD - Well-controlled on current therapy   RTC in 6-12 months or sooner prn  I spent over 30 minutes of time, including in depth chart review, independent review of results as outlined above, communicating results with the patient directly, face-to-face time with the patient, coordinating care, and ordering studies and medications as appropriate, and documentation.         Shellia Cleverly ,DO, FACG 04/16/2023, 3:09 PM

## 2023-04-16 NOTE — Anesthesia Preprocedure Evaluation (Addendum)
Anesthesia Evaluation  Patient identified by MRN, date of birth, ID band Patient awake    Reviewed: Allergy & Precautions, NPO status , Patient's Chart, lab work & pertinent test results  Airway Mallampati: II  TM Distance: >3 FB Neck ROM: Full    Dental  (+) Dental Advisory Given   Pulmonary sleep apnea    Pulmonary exam normal breath sounds clear to auscultation       Cardiovascular hypertension, Pt. on medications + CAD  Normal cardiovascular exam+ dysrhythmias  Rhythm:Regular Rate:Normal  Echo 04/2022  1. Left ventricular ejection fraction, by estimation, is 55 to 60%. The left ventricle has normal function. The left ventricle has no regional wall motion abnormalities. There is mild asymmetric left ventricular hypertrophy of the septal segment. Left ventricular diastolic parameters are consistent with Grade I diastolic dysfunction (impaired relaxation).   2. Right ventricular systolic function is low normal. The right ventricular size is normal. There is normal pulmonary artery systolic pressure. The estimated right ventricular systolic pressure is 18.2 mmHg.   3. The mitral valve is grossly normal. Trivial mitral valve regurgitation.   4. The aortic valve is tricuspid. There is mild calcification of the aortic valve. Aortic valve regurgitation is not visualized.   5. The inferior vena cava is normal in size with greater than 50% respiratory variability, suggesting right atrial pressure of 3 mmHg.   Comparison(s): No prior Echocardiogram.     Neuro/Psych  PSYCHIATRIC DISORDERS  Depression     Neuromuscular disease    GI/Hepatic hiatal hernia,GERD  Medicated,,  Endo/Other  negative endocrine ROS    Renal/GU Renal disease     Musculoskeletal  (+) Arthritis , Osteoarthritis,    Abdominal   Peds  Hematology negative hematology ROS (+)   Anesthesia Other Findings   Reproductive/Obstetrics negative OB ROS                              Anesthesia Physical Anesthesia Plan  ASA: 2  Anesthesia Plan: Spinal   Post-op Pain Management: Regional block* and Tylenol PO (pre-op)*   Induction:   PONV Risk Score and Plan: 2 and Ondansetron, Dexamethasone, Midazolam, Treatment may vary due to age or medical condition, Propofol infusion and TIVA  Airway Management Planned: Natural Airway  Additional Equipment:   Intra-op Plan:   Post-operative Plan:   Informed Consent: I have reviewed the patients History and Physical, chart, labs and discussed the procedure including the risks, benefits and alternatives for the proposed anesthesia with the patient or authorized representative who has indicated his/her understanding and acceptance.     Dental advisory given  Plan Discussed with: CRNA  Anesthesia Plan Comments: (See PAT note from 4/23)        Anesthesia Quick Evaluation

## 2023-04-22 ENCOUNTER — Ambulatory Visit: Payer: Self-pay | Admitting: Student

## 2023-04-22 NOTE — H&P (Signed)
TOTAL KNEE ADMISSION H&P  Patient is being admitted for right total knee arthroplasty.  Subjective:  Chief Complaint:right knee pain.  HPI: Tyler Gates, 67 y.o. male, has a history of pain and functional disability in the right knee due to arthritis and has failed non-surgical conservative treatments for greater than 12 weeks to includeNSAID's and/or analgesics, corticosteriod injections, flexibility and strengthening excercises, use of assistive devices, and activity modification.  Onset of symptoms was gradual, starting 10 years ago with rapidlly worsening course since that time. The patient noted no past surgery on the right knee(s).  Patient currently rates pain in the right knee(s) at 10 out of 10 with activity. Patient has night pain, worsening of pain with activity and weight bearing, pain that interferes with activities of daily living, pain with passive range of motion, crepitus, and joint swelling.  Patient has evidence of subchondral cysts, subchondral sclerosis, periarticular osteophytes, and joint space narrowing by imaging studies. There is no active infection.  Patient Active Problem List   Diagnosis Date Noted   Preoperative clearance 02/11/2023   Chronic sinusitis 03/12/2022   Visual disturbance 03/12/2022   Acute sinusitis 01/29/2022   Chronic kidney disease (CKD) stage G2/A2, mildly decreased glomerular filtration rate (GFR) between 60-89 mL/min/1.73 square meter and albuminuria creatinine ratio between 30-299 mg/g 10/22/2018   Hypogonadism male 10/21/2018   Screening PSA (prostate specific antigen) 10/21/2018   Chronic kidney disease due to benign hypertension 10/21/2018   Depression, major, recurrent, in remission (HCC) 10/21/2018   Statin intolerance 09/14/2018   Agatston coronary artery calcium score greater than 400 08/10/2018   Family history of coronary artery disease 08/10/2018   Crohn's disease of colon without complication (HCC) 05/09/2017   Mucosal  abnormality of esophagus    Hiatal hernia    KNEE JOINT INSTABILITY 03/07/2007   Hyperlipidemia 12/30/2006   Essential hypertension 12/30/2006   GERD 12/30/2006   Past Medical History:  Diagnosis Date   Arthritis    Crohn's disease (HCC)    Depression    Dyspnea on exertion    GERD (gastroesophageal reflux disease)    Herpes    History of kidney stones    Hyperlipidemia    Hypertension    Instability of knee joint    Left displaced femoral neck fracture (HCC) 07/11/2017   Snoring     Past Surgical History:  Procedure Laterality Date   COLONOSCOPY  2007   Dr. Ozzie Hoyle normal TI, ascending colon/cecum with mild inflammation noted but biopses without signs of active inflammation, hyperpastic polyp, possible ischemia on path of left colon biopsy   COLONOSCOPY N/A 11/08/2015   Procedure: COLONOSCOPY;  Surgeon: Corbin Ade, MD;  Location: [facility];  Service: Endoscopy;  Laterality: N/A;   ESOPHAGOGASTRODUODENOSCOPY  2007   Dr. Ozzie Hoyle reflux esophagitis, hiatal hernia, negative H.pylori   ESOPHAGOGASTRODUODENOSCOPY N/A 11/08/2015   Procedure: ESOPHAGOGASTRODUODENOSCOPY (EGD);  Surgeon: Corbin Ade, MD;  Location: [facility];  Service: Endoscopy;  Laterality: N/A;  0830 - moved to 8:15 - office to notify   HIP PINNING,CANNULATED Left 07/12/2017   Procedure: CANNULATED HIP PINNING LEFT;  Surgeon: Samson Frederic, MD;  Location: [facility];  Service: Orthopedics;  Laterality: Left;    Current Outpatient Medications  Medication Sig Dispense Refill Last Dose   amLODipine (NORVASC) 5 MG tablet TAKE 2 TABLETS BY MOUTH EVERY DAY (Patient taking differently: Take 7.5 mg by mouth daily.) 180 tablet 0    Cholecalciferol (VITAMIN D3 PO) Take 1 tablet by mouth daily.  ezetimibe (ZETIA) 10 MG tablet TAKE 1 TABLET BY MOUTH EVERY DAY 90 tablet 2    inFLIXimab (REMICADE) 100 MG injection Remicade 100 mg intravenous solution  INFUSE 400 MG IV ON DAY 1, DAY 14, DAY 42 AND EVERY 8 WEEKS THEREAFTER       loperamide (IMODIUM) 2 MG capsule Take 2 mg by mouth as needed for diarrhea or loose stools.      meloxicam (MOBIC) 15 MG tablet Take 15 mg by mouth daily.      mupirocin ointment (BACTROBAN) 2 % Apply to affected area twice daily for 7 days. Repeat as needed (Patient taking differently: Apply 1 Application topically daily as needed (wound treatment).) 15 g 0    olmesartan (BENICAR) 20 MG tablet Take 1 tablet (20 mg total) by mouth daily. 90 tablet 3    omeprazole (PRILOSEC) 40 MG capsule TAKE 1 CAPSULE (40 MG TOTAL) BY MOUTH IN THE MORNING AND AT BEDTIME. 180 capsule 3    rosuvastatin (CRESTOR) 20 MG tablet Take 1 tablet (20 mg total) by mouth daily. 90 tablet 3    venlafaxine (EFFEXOR) 37.5 MG tablet TAKE 1 TABLET BY MOUTH TWICE A DAY 180 tablet 1    Current Facility-Administered Medications  Medication Dose Route Frequency Provider Last Rate Last Admin   0.9 %  sodium chloride infusion  500 mL Intravenous Once Cirigliano, Vito V, DO       Allergies  Allergen Reactions   Sulfasalazine Other (See Comments)    Flu like feelings   Statins Other (See Comments)    REACTION: Myalgia    Social History   Tobacco Use   Smoking status: Never   Smokeless tobacco: Never  Substance Use Topics   Alcohol use: Not Currently    Family History  Problem Relation Age of Onset   Melanoma Father    Heart disease Father    Healthy Mother    Colon cancer Neg Hx    Esophageal cancer Neg Hx      Review of Systems  Musculoskeletal:  Positive for arthralgias, gait problem and joint swelling.  All other systems reviewed and are negative.   Objective:  Physical Exam Constitutional:      Appearance: Normal appearance.  HENT:     Head: Normocephalic and atraumatic.     Nose: Nose normal.     Mouth/Throat:     Mouth: Mucous membranes are moist.     Pharynx: Oropharynx is clear.  Eyes:     Conjunctiva/sclera: Conjunctivae normal.  Cardiovascular:     Rate and Rhythm: Normal rate and  regular rhythm.     Pulses: Normal pulses.     Heart sounds: Normal heart sounds.  Pulmonary:     Effort: Pulmonary effort is normal.     Breath sounds: Normal breath sounds.  Abdominal:     General: Abdomen is flat.     Palpations: Abdomen is soft.  Genitourinary:    Comments: deferred Musculoskeletal:     Cervical back: Normal range of motion and neck supple.     Comments: Examination of the right knee reveals no skin wounds or lesions. He has some swelling. No warmth or erythema. No effusion. Varus deformity. He has tenderness to palpation of the medial joint line and peripatellar retinacular tissues with a positive grind sign. His range of motion is 0 to 125 degrees without any ligamentous instability. Painless range of motion of the hip.  He is neurovascularly intact distally.  Ambulates with an antalgic gait.  Skin:    General: Skin is warm and dry.     Capillary Refill: Capillary refill takes less than 2 seconds.  Neurological:     General: No focal deficit present.     Mental Status: He is alert and oriented to person, place, and time.  Psychiatric:        Mood and Affect: Mood normal.        Behavior: Behavior normal.        Thought Content: Thought content normal.        Judgment: Judgment normal.     Vital signs in last 24 hours: @VSRANGES @  Labs:   Estimated body mass index is 28.5 kg/m as calculated from the following:   Height as of 04/16/23: 5\' 9"  (1.753 m).   Weight as of 04/16/23: 87.5 kg.   Imaging Review Plain radiographs demonstrate severe degenerative joint disease of the right knee(s). The overall alignment issignificant varus. The bone quality appears to be adequate for age and reported activity level.      Assessment/Plan:  End stage arthritis, right knee   The patient history, physical examination, clinical judgment of the provider and imaging studies are consistent with end stage degenerative joint disease of the right knee(s) and total  knee arthroplasty is deemed medically necessary. The treatment options including medical management, injection therapy arthroscopy and arthroplasty were discussed at length. The risks and benefits of total knee arthroplasty were presented and reviewed. The risks due to aseptic loosening, infection, stiffness, patella tracking problems, thromboembolic complications and other imponderables were discussed. The patient acknowledged the explanation, agreed to proceed with the plan and consent was signed. Patient is being admitted for inpatient treatment for surgery, pain control, PT, OT, prophylactic antibiotics, VTE prophylaxis, progressive ambulation and ADL's and discharge planning. The patient is planning to be discharged home with OPPT.   Therapy Plans: outpatient therapy. [facility] 04/28/23.  Disposition: Home with wife.  Planned DVT Prophylaxis: aspirin 81mg  BID.  DME needed: Has rolling walker. Has ice machine.  PCP: Cleared.  Cardiology: Cleared.  TXA: IV Allergies:  - Sulfa antibiotics - aches and pains.  - Statins - diarrhea.  Anesthesia Concerns: None.  BMI: 28.4. Last HgbA1c: Not diabetic.  Other: - Crohns disease, Remicade infusions last 03/25/23. He will hold Remicade until he has adequate wound healing.  - Oxycodone, zofran, has meloxicam. Hold NSAID.  - 04/15/23: Hgb 15.7, Cr. 1.35, 4.5.    Patient's anticipated LOS is less than 2 midnights, meeting these requirements: - Younger than 4 - Lives within 1 hour of care - Has a competent adult at home to recover with post-op recover - NO history of  - Chronic pain requiring opiods  - Diabetes  - Coronary Artery Disease  - Heart failure  - Heart attack  - Stroke  - DVT/VTE  - Cardiac arrhythmia  - Respiratory Failure/COPD  - Renal failure  - Anemia  - Advanced Liver disease

## 2023-04-22 NOTE — H&P (View-Only) (Signed)
TOTAL KNEE ADMISSION H&P  Patient is being admitted for right total knee arthroplasty.  Subjective:  Chief Complaint:right knee pain.  HPI: Tyler Gates, 67 y.o. male, has a history of pain and functional disability in the right knee due to arthritis and has failed non-surgical conservative treatments for greater than 12 weeks to includeNSAID's and/or analgesics, corticosteriod injections, flexibility and strengthening excercises, use of assistive devices, and activity modification.  Onset of symptoms was gradual, starting 10 years ago with rapidlly worsening course since that time. The patient noted no past surgery on the right knee(s).  Patient currently rates pain in the right knee(s) at 10 out of 10 with activity. Patient has night pain, worsening of pain with activity and weight bearing, pain that interferes with activities of daily living, pain with passive range of motion, crepitus, and joint swelling.  Patient has evidence of subchondral cysts, subchondral sclerosis, periarticular osteophytes, and joint space narrowing by imaging studies. There is no active infection.  Patient Active Problem List   Diagnosis Date Noted   Preoperative clearance 02/11/2023   Chronic sinusitis 03/12/2022   Visual disturbance 03/12/2022   Acute sinusitis 01/29/2022   Chronic kidney disease (CKD) stage G2/A2, mildly decreased glomerular filtration rate (GFR) between 60-89 mL/min/1.73 square meter and albuminuria creatinine ratio between 30-299 mg/g 10/22/2018   Hypogonadism male 10/21/2018   Screening PSA (prostate specific antigen) 10/21/2018   Chronic kidney disease due to benign hypertension 10/21/2018   Depression, major, recurrent, in remission (HCC) 10/21/2018   Statin intolerance 09/14/2018   Agatston coronary artery calcium score greater than 400 08/10/2018   Family history of coronary artery disease 08/10/2018   Crohn's disease of colon without complication (HCC) 05/09/2017   Mucosal  abnormality of esophagus    Hiatal hernia    KNEE JOINT INSTABILITY 03/07/2007   Hyperlipidemia 12/30/2006   Essential hypertension 12/30/2006   GERD 12/30/2006   Past Medical History:  Diagnosis Date   Arthritis    Crohn's disease (HCC)    Depression    Dyspnea on exertion    GERD (gastroesophageal reflux disease)    Herpes    History of kidney stones    Hyperlipidemia    Hypertension    Instability of knee joint    Left displaced femoral neck fracture (HCC) 07/11/2017   Snoring     Past Surgical History:  Procedure Laterality Date   COLONOSCOPY  2007   Dr. Rho normal TI, ascending colon/cecum with mild inflammation noted but biopses without signs of active inflammation, hyperpastic polyp, possible ischemia on path of left colon biopsy   COLONOSCOPY N/A 11/08/2015   Procedure: COLONOSCOPY;  Surgeon: Robert M Rourk, MD;  Location: [facility];  Service: Endoscopy;  Laterality: N/A;   ESOPHAGOGASTRODUODENOSCOPY  2007   Dr. Rho reflux esophagitis, hiatal hernia, negative H.pylori   ESOPHAGOGASTRODUODENOSCOPY N/A 11/08/2015   Procedure: ESOPHAGOGASTRODUODENOSCOPY (EGD);  Surgeon: Robert M Rourk, MD;  Location: [facility];  Service: Endoscopy;  Laterality: N/A;  0830 - moved to 8:15 - office to notify   HIP PINNING,CANNULATED Left 07/12/2017   Procedure: CANNULATED HIP PINNING LEFT;  Surgeon: Swinteck, Brian, MD;  Location: [facility];  Service: Orthopedics;  Laterality: Left;    Current Outpatient Medications  Medication Sig Dispense Refill Last Dose   amLODipine (NORVASC) 5 MG tablet TAKE 2 TABLETS BY MOUTH EVERY DAY (Patient taking differently: Take 7.5 mg by mouth daily.) 180 tablet 0    Cholecalciferol (VITAMIN D3 PO) Take 1 tablet by mouth daily.        ezetimibe (ZETIA) 10 MG tablet TAKE 1 TABLET BY MOUTH EVERY DAY 90 tablet 2    inFLIXimab (REMICADE) 100 MG injection Remicade 100 mg intravenous solution  INFUSE 400 MG IV ON DAY 1, DAY 14, DAY 42 AND EVERY 8 WEEKS THEREAFTER       loperamide (IMODIUM) 2 MG capsule Take 2 mg by mouth as needed for diarrhea or loose stools.      meloxicam (MOBIC) 15 MG tablet Take 15 mg by mouth daily.      mupirocin ointment (BACTROBAN) 2 % Apply to affected area twice daily for 7 days. Repeat as needed (Patient taking differently: Apply 1 Application topically daily as needed (wound treatment).) 15 g 0    olmesartan (BENICAR) 20 MG tablet Take 1 tablet (20 mg total) by mouth daily. 90 tablet 3    omeprazole (PRILOSEC) 40 MG capsule TAKE 1 CAPSULE (40 MG TOTAL) BY MOUTH IN THE MORNING AND AT BEDTIME. 180 capsule 3    rosuvastatin (CRESTOR) 20 MG tablet Take 1 tablet (20 mg total) by mouth daily. 90 tablet 3    venlafaxine (EFFEXOR) 37.5 MG tablet TAKE 1 TABLET BY MOUTH TWICE A DAY 180 tablet 1    Current Facility-Administered Medications  Medication Dose Route Frequency Provider Last Rate Last Admin   0.9 %  sodium chloride infusion  500 mL Intravenous Once Cirigliano, Vito V, DO       Allergies  Allergen Reactions   Sulfasalazine Other (See Comments)    Flu like feelings   Statins Other (See Comments)    REACTION: Myalgia    Social History   Tobacco Use   Smoking status: Never   Smokeless tobacco: Never  Substance Use Topics   Alcohol use: Not Currently    Family History  Problem Relation Age of Onset   Melanoma Father    Heart disease Father    Healthy Mother    Colon cancer Neg Hx    Esophageal cancer Neg Hx      Review of Systems  Musculoskeletal:  Positive for arthralgias, gait problem and joint swelling.  All other systems reviewed and are negative.   Objective:  Physical Exam Constitutional:      Appearance: Normal appearance.  HENT:     Head: Normocephalic and atraumatic.     Nose: Nose normal.     Mouth/Throat:     Mouth: Mucous membranes are moist.     Pharynx: Oropharynx is clear.  Eyes:     Conjunctiva/sclera: Conjunctivae normal.  Cardiovascular:     Rate and Rhythm: Normal rate and  regular rhythm.     Pulses: Normal pulses.     Heart sounds: Normal heart sounds.  Pulmonary:     Effort: Pulmonary effort is normal.     Breath sounds: Normal breath sounds.  Abdominal:     General: Abdomen is flat.     Palpations: Abdomen is soft.  Genitourinary:    Comments: deferred Musculoskeletal:     Cervical back: Normal range of motion and neck supple.     Comments: Examination of the right knee reveals no skin wounds or lesions. He has some swelling. No warmth or erythema. No effusion. Varus deformity. He has tenderness to palpation of the medial joint line and peripatellar retinacular tissues with a positive grind sign. His range of motion is 0 to 125 degrees without any ligamentous instability. Painless range of motion of the hip.  He is neurovascularly intact distally.  Ambulates with an antalgic gait.    Skin:    General: Skin is warm and dry.     Capillary Refill: Capillary refill takes less than 2 seconds.  Neurological:     General: No focal deficit present.     Mental Status: He is alert and oriented to person, place, and time.  Psychiatric:        Mood and Affect: Mood normal.        Behavior: Behavior normal.        Thought Content: Thought content normal.        Judgment: Judgment normal.     Vital signs in last 24 hours: @VSRANGES@  Labs:   Estimated body mass index is 28.5 kg/m as calculated from the following:   Height as of 04/16/23: 5' 9" (1.753 m).   Weight as of 04/16/23: 87.5 kg.   Imaging Review Plain radiographs demonstrate severe degenerative joint disease of the right knee(s). The overall alignment issignificant varus. The bone quality appears to be adequate for age and reported activity level.      Assessment/Plan:  End stage arthritis, right knee   The patient history, physical examination, clinical judgment of the provider and imaging studies are consistent with end stage degenerative joint disease of the right knee(s) and total  knee arthroplasty is deemed medically necessary. The treatment options including medical management, injection therapy arthroscopy and arthroplasty were discussed at length. The risks and benefits of total knee arthroplasty were presented and reviewed. The risks due to aseptic loosening, infection, stiffness, patella tracking problems, thromboembolic complications and other imponderables were discussed. The patient acknowledged the explanation, agreed to proceed with the plan and consent was signed. Patient is being admitted for inpatient treatment for surgery, pain control, PT, OT, prophylactic antibiotics, VTE prophylaxis, progressive ambulation and ADL's and discharge planning. The patient is planning to be discharged home with OPPT.   Therapy Plans: outpatient therapy. [facility] 04/28/23.  Disposition: Home with wife.  Planned DVT Prophylaxis: aspirin 81mg BID.  DME needed: Has rolling walker. Has ice machine.  PCP: Cleared.  Cardiology: Cleared.  TXA: IV Allergies:  - Sulfa antibiotics - aches and pains.  - Statins - diarrhea.  Anesthesia Concerns: None.  BMI: 28.4. Last HgbA1c: Not diabetic.  Other: - Crohns disease, Remicade infusions last 03/25/23. He will hold Remicade until he has adequate wound healing.  - Oxycodone, zofran, has meloxicam. Hold NSAID.  - 04/15/23: Hgb 15.7, Cr. 1.35, 4.5.    Patient's anticipated LOS is less than 2 midnights, meeting these requirements: - Younger than 65 - Lives within 1 hour of care - Has a competent adult at home to recover with post-op recover - NO history of  - Chronic pain requiring opiods  - Diabetes  - Coronary Artery Disease  - Heart failure  - Heart attack  - Stroke  - DVT/VTE  - Cardiac arrhythmia  - Respiratory Failure/COPD  - Renal failure  - Anemia  - Advanced Liver disease   

## 2023-04-23 ENCOUNTER — Ambulatory Visit (HOSPITAL_BASED_OUTPATIENT_CLINIC_OR_DEPARTMENT_OTHER): Payer: Medicare HMO | Admitting: Anesthesiology

## 2023-04-23 ENCOUNTER — Ambulatory Visit (HOSPITAL_COMMUNITY)
Admission: RE | Admit: 2023-04-23 | Discharge: 2023-04-23 | Disposition: A | Discharge status: Home / Self Care | Payer: Medicare HMO | Source: Ambulatory Visit | Attending: Orthopedic Surgery | Admitting: Orthopedic Surgery

## 2023-04-23 ENCOUNTER — Encounter (HOSPITAL_COMMUNITY): Payer: Self-pay | Admitting: Orthopedic Surgery

## 2023-04-23 ENCOUNTER — Other Ambulatory Visit (HOSPITAL_COMMUNITY): Payer: Self-pay

## 2023-04-23 ENCOUNTER — Ambulatory Visit (HOSPITAL_COMMUNITY): Payer: Medicare HMO

## 2023-04-23 ENCOUNTER — Ambulatory Visit (HOSPITAL_COMMUNITY): Payer: Medicare HMO | Admitting: Medical

## 2023-04-23 ENCOUNTER — Encounter (HOSPITAL_COMMUNITY)
Admission: RE | Disposition: A | Discharge status: Home / Self Care | Payer: Self-pay | Source: Ambulatory Visit | Attending: Orthopedic Surgery

## 2023-04-23 ENCOUNTER — Other Ambulatory Visit: Payer: Self-pay

## 2023-04-23 DIAGNOSIS — I2510 Atherosclerotic heart disease of native coronary artery without angina pectoris: Secondary | ICD-10-CM | POA: Insufficient documentation

## 2023-04-23 DIAGNOSIS — I10 Essential (primary) hypertension: Secondary | ICD-10-CM

## 2023-04-23 DIAGNOSIS — I130 Hypertensive heart and chronic kidney disease with heart failure and stage 1 through stage 4 chronic kidney disease, or unspecified chronic kidney disease: Secondary | ICD-10-CM | POA: Insufficient documentation

## 2023-04-23 DIAGNOSIS — G4730 Sleep apnea, unspecified: Secondary | ICD-10-CM

## 2023-04-23 DIAGNOSIS — G8918 Other acute postprocedural pain: Secondary | ICD-10-CM | POA: Diagnosis not present

## 2023-04-23 DIAGNOSIS — M1711 Unilateral primary osteoarthritis, right knee: Secondary | ICD-10-CM

## 2023-04-23 DIAGNOSIS — Z79899 Other long term (current) drug therapy: Secondary | ICD-10-CM | POA: Insufficient documentation

## 2023-04-23 DIAGNOSIS — K219 Gastro-esophageal reflux disease without esophagitis: Secondary | ICD-10-CM | POA: Diagnosis not present

## 2023-04-23 DIAGNOSIS — K449 Diaphragmatic hernia without obstruction or gangrene: Secondary | ICD-10-CM | POA: Insufficient documentation

## 2023-04-23 DIAGNOSIS — Z01818 Encounter for other preprocedural examination: Secondary | ICD-10-CM

## 2023-04-23 DIAGNOSIS — F32A Depression, unspecified: Secondary | ICD-10-CM | POA: Insufficient documentation

## 2023-04-23 DIAGNOSIS — Z471 Aftercare following joint replacement surgery: Secondary | ICD-10-CM | POA: Diagnosis not present

## 2023-04-23 HISTORY — PX: KNEE ARTHROPLASTY: SHX992

## 2023-04-23 SURGERY — ARTHROPLASTY, KNEE, TOTAL, USING IMAGELESS COMPUTER-ASSISTED NAVIGATION
Anesthesia: Spinal | Site: Knee | Laterality: Right

## 2023-04-23 MED ORDER — ACETAMINOPHEN 500 MG PO TABS: 1000.0000 mg | ORAL_TABLET | Freq: Once | ORAL | Status: DC

## 2023-04-23 MED ORDER — ONDANSETRON HCL 4 MG/2ML IJ SOLN
INTRAMUSCULAR | Status: DC | PRN
  Administered 2023-04-23: 4 mg via INTRAVENOUS

## 2023-04-23 MED ORDER — ACETAMINOPHEN 325 MG PO TABS: 325.0000 mg | ORAL_TABLET | Freq: Four times a day (QID) | ORAL | Status: DC | PRN

## 2023-04-23 MED ORDER — DEXAMETHASONE SODIUM PHOSPHATE 4 MG/ML IJ SOLN
INTRAMUSCULAR | Status: DC | PRN
  Administered 2023-04-23: 5 mg via PERINEURAL

## 2023-04-23 MED ORDER — PROPOFOL 500 MG/50ML IV EMUL
INTRAVENOUS | Status: AC
  Filled 2023-04-23: qty 50

## 2023-04-23 MED ORDER — SODIUM CHLORIDE 0.9 % IR SOLN
Status: DC | PRN
  Administered 2023-04-23: 3000 mL

## 2023-04-23 MED ORDER — HYDROMORPHONE HCL 1 MG/ML IJ SOLN: 0.2500 mg | INTRAMUSCULAR | Status: DC | PRN

## 2023-04-23 MED ORDER — TRANEXAMIC ACID-NACL 1000-0.7 MG/100ML-% IV SOLN
1000.0000 mg | INTRAVENOUS | Status: AC
  Administered 2023-04-23: 1000 mg via INTRAVENOUS
  Filled 2023-04-23: qty 100

## 2023-04-23 MED ORDER — ONDANSETRON HCL 4 MG PO TABS
4.0000 mg | ORAL_TABLET | Freq: Three times a day (TID) | ORAL | 0 refills | Status: DC | PRN
  Filled 2023-04-23: qty 20, 7d supply, fill #0

## 2023-04-23 MED ORDER — MIDAZOLAM HCL 5 MG/5ML IJ SOLN
INTRAMUSCULAR | Status: DC | PRN
  Administered 2023-04-23: 1 mg via INTRAVENOUS

## 2023-04-23 MED ORDER — OXYCODONE HCL 5 MG PO TABS
ORAL_TABLET | ORAL | Status: AC
  Filled 2023-04-23: qty 1

## 2023-04-23 MED ORDER — FENTANYL CITRATE (PF) 250 MCG/5ML IJ SOLN
INTRAMUSCULAR | Status: DC | PRN
  Administered 2023-04-23 (×2): 50 ug via INTRAVENOUS

## 2023-04-23 MED ORDER — PROPOFOL 10 MG/ML IV BOLUS
INTRAVENOUS | Status: AC
  Filled 2023-04-23: qty 20

## 2023-04-23 MED ORDER — METHOCARBAMOL 500 MG IVPB - SIMPLE MED
500.0000 mg | Freq: Four times a day (QID) | INTRAVENOUS | Status: DC | PRN
  Administered 2023-04-23: 500 mg via INTRAVENOUS

## 2023-04-23 MED ORDER — LACTATED RINGERS IV SOLN: INTRAVENOUS | Status: DC

## 2023-04-23 MED ORDER — BUPIVACAINE HCL 0.25 % IJ SOLN
INTRAMUSCULAR | Status: AC
  Filled 2023-04-23: qty 1

## 2023-04-23 MED ORDER — SENNA 8.6 MG PO TABS
2.0000 | ORAL_TABLET | Freq: Every day | ORAL | 1 refills | Status: AC
  Filled 2023-04-23: qty 60, 30d supply, fill #0

## 2023-04-23 MED ORDER — BUPIVACAINE-EPINEPHRINE 0.25% -1:200000 IJ SOLN
INTRAMUSCULAR | Status: DC | PRN
  Administered 2023-04-23: 30 mL

## 2023-04-23 MED ORDER — KETOROLAC TROMETHAMINE 30 MG/ML IJ SOLN
INTRAMUSCULAR | Status: AC
  Filled 2023-04-23: qty 1

## 2023-04-23 MED ORDER — OXYCODONE HCL 5 MG PO TABS
5.0000 mg | ORAL_TABLET | ORAL | Status: DC | PRN
  Administered 2023-04-23 (×2): 5 mg via ORAL

## 2023-04-23 MED ORDER — METOCLOPRAMIDE HCL 5 MG PO TABS: 5.0000 mg | ORAL_TABLET | Freq: Three times a day (TID) | ORAL | Status: DC | PRN

## 2023-04-23 MED ORDER — SODIUM CHLORIDE 0.9% FLUSH
INTRAVENOUS | Status: DC | PRN
  Administered 2023-04-23: 30 mL

## 2023-04-23 MED ORDER — POVIDONE-IODINE 10 % EX SWAB: 2.0000 | Freq: Once | CUTANEOUS | Status: DC

## 2023-04-23 MED ORDER — METHOCARBAMOL 500 MG IVPB - SIMPLE MED
INTRAVENOUS | Status: AC
  Filled 2023-04-23: qty 55

## 2023-04-23 MED ORDER — DEXAMETHASONE SODIUM PHOSPHATE 10 MG/ML IJ SOLN
INTRAMUSCULAR | Status: DC | PRN
  Administered 2023-04-23: 10 mg via INTRAVENOUS

## 2023-04-23 MED ORDER — ASPIRIN 81 MG PO CHEW
81.0000 mg | CHEWABLE_TABLET | Freq: Two times a day (BID) | ORAL | 0 refills | Status: AC
  Filled 2023-04-23: qty 90, 45d supply, fill #0

## 2023-04-23 MED ORDER — EPINEPHRINE PF 1 MG/ML IJ SOLN
INTRAMUSCULAR | Status: AC
  Filled 2023-04-23: qty 2

## 2023-04-23 MED ORDER — PHENYLEPHRINE HCL-NACL 20-0.9 MG/250ML-% IV SOLN
INTRAVENOUS | Status: AC
  Filled 2023-04-23: qty 250

## 2023-04-23 MED ORDER — ORAL CARE MOUTH RINSE: 15.0000 mL | Freq: Once | OROMUCOSAL | Status: AC

## 2023-04-23 MED ORDER — DIPHENHYDRAMINE HCL 50 MG/ML IJ SOLN
INTRAMUSCULAR | Status: AC
  Filled 2023-04-23: qty 1

## 2023-04-23 MED ORDER — CEFAZOLIN SODIUM-DEXTROSE 2-4 GM/100ML-% IV SOLN
2.0000 g | Freq: Four times a day (QID) | INTRAVENOUS | Status: DC
  Administered 2023-04-23: 2 g via INTRAVENOUS

## 2023-04-23 MED ORDER — MIDAZOLAM HCL 2 MG/2ML IJ SOLN
INTRAMUSCULAR | Status: AC
  Filled 2023-04-23: qty 2

## 2023-04-23 MED ORDER — PROPOFOL 500 MG/50ML IV EMUL
INTRAVENOUS | Status: DC | PRN
  Administered 2023-04-23: 150 ug/kg/min via INTRAVENOUS

## 2023-04-23 MED ORDER — POLYETHYLENE GLYCOL 3350 17 G PO PACK
17.0000 g | PACK | Freq: Every day | ORAL | 0 refills | Status: DC | PRN
  Filled 2023-04-23: qty 14, 14d supply, fill #0

## 2023-04-23 MED ORDER — SODIUM CHLORIDE 0.9 % IR SOLN
Status: DC | PRN
  Administered 2023-04-23 (×2): 1000 mL

## 2023-04-23 MED ORDER — METOCLOPRAMIDE HCL 5 MG/ML IJ SOLN: 5.0000 mg | Freq: Three times a day (TID) | INTRAMUSCULAR | Status: DC | PRN

## 2023-04-23 MED ORDER — ACETAMINOPHEN 500 MG PO TABS
1000.0000 mg | ORAL_TABLET | Freq: Three times a day (TID) | ORAL | 0 refills | Status: AC
  Filled 2023-04-23: qty 180, 30d supply, fill #0

## 2023-04-23 MED ORDER — LACTATED RINGERS IV BOLUS
250.0000 mL | Freq: Once | INTRAVENOUS | Status: AC
  Administered 2023-04-23: 250 mL via INTRAVENOUS

## 2023-04-23 MED ORDER — OXYCODONE HCL 5 MG PO TABS: 10.0000 mg | ORAL_TABLET | ORAL | Status: DC | PRN

## 2023-04-23 MED ORDER — METHOCARBAMOL 500 MG PO TABS: 500.0000 mg | ORAL_TABLET | Freq: Four times a day (QID) | ORAL | Status: DC | PRN

## 2023-04-23 MED ORDER — CEFAZOLIN SODIUM-DEXTROSE 2-4 GM/100ML-% IV SOLN
2.0000 g | INTRAVENOUS | Status: AC
  Administered 2023-04-23: 2 g via INTRAVENOUS
  Filled 2023-04-23: qty 100

## 2023-04-23 MED ORDER — KETOROLAC TROMETHAMINE 30 MG/ML IJ SOLN
INTRAMUSCULAR | Status: DC | PRN
  Administered 2023-04-23: 30 mg

## 2023-04-23 MED ORDER — PROPOFOL 1000 MG/100ML IV EMUL
INTRAVENOUS | Status: AC
  Filled 2023-04-23: qty 100

## 2023-04-23 MED ORDER — ACETAMINOPHEN 500 MG PO TABS
ORAL_TABLET | ORAL | Status: AC
  Filled 2023-04-23: qty 2

## 2023-04-23 MED ORDER — HYDROMORPHONE HCL 1 MG/ML IJ SOLN: 0.5000 mg | INTRAMUSCULAR | Status: DC | PRN

## 2023-04-23 MED ORDER — ACETAMINOPHEN 500 MG PO TABS
1000.0000 mg | ORAL_TABLET | Freq: Once | ORAL | Status: AC
  Administered 2023-04-23: 1000 mg via ORAL
  Filled 2023-04-23: qty 2

## 2023-04-23 MED ORDER — OXYCODONE HCL 5 MG PO TABS
5.0000 mg | ORAL_TABLET | ORAL | 0 refills | Status: AC | PRN
  Filled 2023-04-23: qty 42, 7d supply, fill #0

## 2023-04-23 MED ORDER — ONDANSETRON HCL 4 MG/2ML IJ SOLN: 4.0000 mg | Freq: Four times a day (QID) | INTRAMUSCULAR | Status: DC | PRN

## 2023-04-23 MED ORDER — CHLORHEXIDINE GLUCONATE 0.12 % MT SOLN
15.0000 mL | Freq: Once | OROMUCOSAL | Status: AC
  Administered 2023-04-23: 15 mL via OROMUCOSAL

## 2023-04-23 MED ORDER — FENTANYL CITRATE (PF) 100 MCG/2ML IJ SOLN
INTRAMUSCULAR | Status: AC
  Filled 2023-04-23: qty 2

## 2023-04-23 MED ORDER — ROPIVACAINE HCL 5 MG/ML IJ SOLN
INTRAMUSCULAR | Status: DC | PRN
  Administered 2023-04-23: 30 mL via PERINEURAL

## 2023-04-23 MED ORDER — CLONIDINE HCL (ANALGESIA) 100 MCG/ML EP SOLN
EPIDURAL | Status: DC | PRN
  Administered 2023-04-23: 80 ug

## 2023-04-23 MED ORDER — ONDANSETRON HCL 4 MG PO TABS: 4.0000 mg | ORAL_TABLET | Freq: Four times a day (QID) | ORAL | Status: DC | PRN

## 2023-04-23 MED ORDER — CEFAZOLIN SODIUM-DEXTROSE 2-4 GM/100ML-% IV SOLN
INTRAVENOUS | Status: AC
  Filled 2023-04-23: qty 100

## 2023-04-23 MED ORDER — ISOPROPYL ALCOHOL 70 % SOLN
Status: DC | PRN
  Administered 2023-04-23: 1 via TOPICAL

## 2023-04-23 MED ORDER — DOCUSATE SODIUM 100 MG PO CAPS
100.0000 mg | ORAL_CAPSULE | Freq: Two times a day (BID) | ORAL | 1 refills | Status: AC
  Filled 2023-04-23: qty 60, 30d supply, fill #0

## 2023-04-23 MED ORDER — ISOPROPYL ALCOHOL 70 % SOLN
Status: AC
  Filled 2023-04-23: qty 480

## 2023-04-23 MED ORDER — PROMETHAZINE HCL 25 MG/ML IJ SOLN: 6.2500 mg | INTRAMUSCULAR | Status: DC | PRN

## 2023-04-23 MED ORDER — SODIUM CHLORIDE (PF) 0.9 % IJ SOLN
INTRAMUSCULAR | Status: AC
  Filled 2023-04-23: qty 30

## 2023-04-23 MED ORDER — LACTATED RINGERS IV BOLUS
500.0000 mL | Freq: Once | INTRAVENOUS | Status: AC
  Administered 2023-04-23: 500 mL via INTRAVENOUS

## 2023-04-23 MED ORDER — PHENYLEPHRINE HCL-NACL 20-0.9 MG/250ML-% IV SOLN
INTRAVENOUS | Status: DC | PRN
  Administered 2023-04-23: 40 ug/min via INTRAVENOUS

## 2023-04-23 MED ORDER — ACETAMINOPHEN 500 MG PO TABS
1000.0000 mg | ORAL_TABLET | Freq: Four times a day (QID) | ORAL | Status: DC
  Administered 2023-04-23: 1000 mg via ORAL

## 2023-04-23 SURGICAL SUPPLY — 80 items
ADH SKN CLS APL DERMABOND .7 (GAUZE/BANDAGES/DRESSINGS) ×2
APL PRP STRL LF DISP 70% ISPRP (MISCELLANEOUS) ×2
BAG COUNTER SPONGE SURGICOUNT (BAG) IMPLANT
BAG SPEC THK2 15X12 ZIP CLS (MISCELLANEOUS) ×1
BAG SPNG CNTER NS LX DISP (BAG) ×1
BAG ZIPLOCK 12X15 (MISCELLANEOUS) IMPLANT
BATTERY INSTRU NAVIGATION (MISCELLANEOUS) ×6 IMPLANT
BLADE SAW RECIPROCATING 77.5 (BLADE) ×2 IMPLANT
BNDG CMPR 5X4 KNIT ELC UNQ LF (GAUZE/BANDAGES/DRESSINGS) ×1
BNDG CMPR 5X62 HK CLSR LF (GAUZE/BANDAGES/DRESSINGS) ×1
BNDG CMPR MED 15X6 ELC VLCR LF (GAUZE/BANDAGES/DRESSINGS) ×1
BNDG CMPR STD VLCR NS LF 5.8X4 (GAUZE/BANDAGES/DRESSINGS) ×1
BNDG ELASTIC 4INX 5YD STR LF (GAUZE/BANDAGES/DRESSINGS) ×2 IMPLANT
BNDG ELASTIC 4X5.8 VLCR NS LF (GAUZE/BANDAGES/DRESSINGS) IMPLANT
BNDG ELASTIC 6INX 5YD STR LF (GAUZE/BANDAGES/DRESSINGS) ×2 IMPLANT
BNDG ELASTIC 6X15 VLCR STRL LF (GAUZE/BANDAGES/DRESSINGS) IMPLANT
BTRY SRG DRVR LF (MISCELLANEOUS) ×3
CHLORAPREP W/TINT 26 (MISCELLANEOUS) ×4 IMPLANT
COMP FEM PS KNEE STD 10 RT (Joint) ×1 IMPLANT
COMPONENT FEM PS KN STD 10 RT (Joint) IMPLANT
COVER SURGICAL LIGHT HANDLE (MISCELLANEOUS) ×2 IMPLANT
DERMABOND ADVANCED .7 DNX12 (GAUZE/BANDAGES/DRESSINGS) ×4 IMPLANT
DRAPE SHEET LG 3/4 BI-LAMINATE (DRAPES) ×6 IMPLANT
DRAPE U-SHAPE 47X51 STRL (DRAPES) ×2 IMPLANT
DRSG AQUACEL AG ADV 3.5X10 (GAUZE/BANDAGES/DRESSINGS) ×2 IMPLANT
ELECT BLADE TIP CTD 4 INCH (ELECTRODE) ×2 IMPLANT
ELECT REM PT RETURN 15FT ADLT (MISCELLANEOUS) ×2 IMPLANT
FILTER STRAW (MISCELLANEOUS) IMPLANT
GAUZE SPONGE 4X4 12PLY STRL (GAUZE/BANDAGES/DRESSINGS) ×2 IMPLANT
GLOVE BIO SURGEON STRL SZ7 (GLOVE) ×2 IMPLANT
GLOVE BIO SURGEON STRL SZ8.5 (GLOVE) ×4 IMPLANT
GLOVE BIOGEL PI IND STRL 7.5 (GLOVE) ×2 IMPLANT
GLOVE BIOGEL PI IND STRL 8.5 (GLOVE) ×2 IMPLANT
GOWN SPEC L3 XXLG W/TWL (GOWN DISPOSABLE) ×2 IMPLANT
GOWN STRL REUS W/ TWL XL LVL3 (GOWN DISPOSABLE) ×2 IMPLANT
GOWN STRL REUS W/TWL XL LVL3 (GOWN DISPOSABLE) ×1
HANDPIECE INTERPULSE COAX TIP (DISPOSABLE) ×1
HDLS TROCR DRIL PIN KNEE 75 (PIN) ×1
HOLDER FOLEY CATH W/STRAP (MISCELLANEOUS) ×2 IMPLANT
HOOD PEEL AWAY T7 (MISCELLANEOUS) ×6 IMPLANT
INSERT TIB AS PS EF/3-11 10 RT (Insert) IMPLANT
KIT TURNOVER KIT A (KITS) IMPLANT
MARKER SKIN DUAL TIP RULER LAB (MISCELLANEOUS) ×2 IMPLANT
NDL SAFETY ECLIP 18X1.5 (MISCELLANEOUS) ×2 IMPLANT
NDL SIDE PORT 18GA QUICK PRESS (NEEDLE) IMPLANT
NDL SPNL 18GX3.5 QUINCKE PK (NEEDLE) ×2 IMPLANT
NEEDLE SPNL 18GX3.5 QUINCKE PK (NEEDLE) ×1 IMPLANT
NS IRRIG 1000ML POUR BTL (IV SOLUTION) ×2 IMPLANT
PACK TOTAL KNEE CUSTOM (KITS) ×2 IMPLANT
PADDING CAST COTTON 6X4 STRL (CAST SUPPLIES) ×2 IMPLANT
PATELLA STD SZ 38X10 (Miscellaneous) IMPLANT
PIN DRILL HDLS TROCAR 75 4PK (PIN) IMPLANT
PROS FEM KNEE PS STD 9 RT (Joint) ×1 IMPLANT
PROS TIB KNEE PS 0D F RT (Joint) ×1 IMPLANT
PROSTHESIS FEM KNEE PS STD9 RT (Joint) IMPLANT
PROSTHESIS TIB KNEE PS 0D F RT (Joint) IMPLANT
PROTECTOR NERVE ULNAR (MISCELLANEOUS) ×2 IMPLANT
SAW OSC TIP CART 19.5X105X1.3 (SAW) ×2 IMPLANT
SEALER BIPOLAR AQUA 6.0 (INSTRUMENTS) ×2 IMPLANT
SET HNDPC FAN SPRY TIP SCT (DISPOSABLE) ×2 IMPLANT
SET PAD KNEE POSITIONER (MISCELLANEOUS) ×2 IMPLANT
SOLUTION PRONTOSAN WOUND 350ML (IRRIGATION / IRRIGATOR) IMPLANT
SPIKE FLUID TRANSFER (MISCELLANEOUS) ×4 IMPLANT
SUT MNCRL AB 3-0 PS2 18 (SUTURE) ×2 IMPLANT
SUT MNCRL AB 4-0 PS2 18 (SUTURE) IMPLANT
SUT MON AB 2-0 CT1 36 (SUTURE) ×2 IMPLANT
SUT STRATAFIX PDO 1 14 VIOLET (SUTURE) ×1
SUT STRATFX PDO 1 14 VIOLET (SUTURE) ×1
SUT VIC AB 1 CTX 36 (SUTURE) ×2
SUT VIC AB 1 CTX36XBRD ANBCTR (SUTURE) ×4 IMPLANT
SUT VIC AB 2-0 CT1 27 (SUTURE) ×1
SUT VIC AB 2-0 CT1 TAPERPNT 27 (SUTURE) ×2 IMPLANT
SUTURE STRATFX PDO 1 14 VIOLET (SUTURE) ×2 IMPLANT
SYR 27GX1/2 1ML LL SAFETY (SYRINGE) IMPLANT
SYR 30ML LL (SYRINGE) IMPLANT
SYR 3ML LL SCALE MARK (SYRINGE) ×2 IMPLANT
TRAY FOLEY MTR SLVR 16FR STAT (SET/KITS/TRAYS/PACK) IMPLANT
TUBE SUCTION HIGH CAP CLEAR NV (SUCTIONS) ×2 IMPLANT
WATER STERILE IRR 1000ML POUR (IV SOLUTION) ×4 IMPLANT
WRAP KNEE MAXI GEL POST OP (GAUZE/BANDAGES/DRESSINGS) IMPLANT

## 2023-04-23 NOTE — Op Note (Signed)
OPERATIVE REPORT  SURGEON: Samson Frederic, MD   ASSISTANT: Clint Bolder, PA-C  PREOPERATIVE DIAGNOSIS: Primary Right knee arthritis.   POSTOPERATIVE DIAGNOSIS: Primary Right knee arthritis.   PROCEDURE: Computer assisted Right total knee arthroplasty.   IMPLANTS: Zimmer Persona PPS Cementless CR femur, size 9. Persona 0 degree Spiked Keel OsseoTi Tibia, size F. Vivacit-E polyethelyene insert, size 10 mm, CR. TM standard patella, size 38 mm.  ANESTHESIA:  MAC, Regional, and Spinal  TOURNIQUET TIME: Not utilized.   ESTIMATED BLOOD LOSS:-150 mL    ANTIBIOTICS: 2 g Ancef.  DRAINS: None.  COMPLICATIONS: None   CONDITION: PACU - hemodynamically stable.   BRIEF CLINICAL NOTE: Tyler Gates is a 67 y.o. male with a long-standing history of Right knee arthritis. After failing conservative management, the patient was indicated for total knee arthroplasty. The risks, benefits, and alternatives to the procedure were explained, and the patient elected to proceed.  PROCEDURE IN DETAIL: Adductor canal block was obtained in the pre-op holding area. Once inside the operative room, spinal anesthesia was obtained, and a foley catheter was inserted. The patient was then positioned and the lower extremity was prepped and draped in the normal sterile surgical fashion.  A time-out was called verifying side and site of surgery. The patient received IV antibiotics within 60 minutes of beginning the procedure. A tourniquet was not utilized.   An anterior approach to the knee was performed utilizing a midvastus arthrotomy. A medial release was performed and the patellar fat pad was excised. Stryker imageless navigation was used to cut the distal femur perpendicular to the mechanical axis. A freehand patellar resection was performed, and the patella was sized an prepared with a lug hole.  Nagivation was used to make a neutral proximal tibia resection, taking 9 mm of bone from the less affected lateral side  with 3 degrees of slope. The menisci were excised. A spacer block was placed, and the alignment and balance in extension were confirmed.   The distal femur was sized using the 3-degree external rotation guide referencing the posterior femoral cortex. The appropriate 4-in-1 cutting block was pinned into place. Rotation was checked using Whiteside's line, the epicondylar axis, and then confirmed with a spacer block in flexion. The remaining femoral cuts were performed, taking care to protect the MCL.  The tibia was sized and the trial tray was pinned into place. The remaining trail components were inserted. The knee was stable to varus and valgus stress through a full range of motion. The patella tracked centrally, and the PCL was well balanced. The trial components were removed, and the proximal tibial surface was prepared. Final components were impacted into place. The knee was tested for a final time and found to be well balanced.   The wound was copiously irrigated with Prontosan solution and normal saline using pule lavage.  Marcaine solution was injected into the periarticular soft tissue.  The wound was closed in layers using #1 Vicryl and Stratafix for the fascia, 2-0 Vicryl for the subcutaneous fat, 2-0 Monocryl for the deep dermal layer, 3-0 running Monocryl subcuticular Stitch, and 4-0 Monocryl stay sutures at both ends of the wound. Dermabond was applied to the skin.  Once the glue was fully dried, an Aquacell Ag and compressive dressing were applied.  The patient was transported to the recovery room in stable condition.  Sponge, needle, and instrument counts were correct at the end of the case x2.  The patient tolerated the procedure well and there were no known  complications.  The aquamantis was utilized for this case to help facilitate better hemostasis as patient was felt to be at increased risk of bleeding because of complex case requiring increased OR time and/or exposure - no tourniquet  use.  A oscillating saw tip was utilized for this case to prevent damage to the soft tissue structures such as muscles, ligaments and tendons, and to ensure accurate bone cuts. This patient was at increased risk for above structures due to  minimally invasive approach.  Please note that a surgical assistant was a medical necessity for this procedure in order to perform it in a safe and expeditious manner. Surgical assistant was necessary to retract the ligaments and vital neurovascular structures to prevent injury to them and also necessary for proper positioning of the limb to allow for anatomic placement of the prosthesis.

## 2023-04-23 NOTE — Anesthesia Procedure Notes (Signed)
Spinal  Patient location during procedure: OR Start time: 04/23/2023 8:32 AM End time: 04/23/2023 8:36 AM Reason for block: surgical anesthesia Staffing Performed: anesthesiologist  Anesthesiologist: Lewie Loron, MD Performed by: Lewie Loron, MD Authorized by: Lewie Loron, MD   Preanesthetic Checklist Completed: patient identified, IV checked, site marked, risks and benefits discussed, surgical consent, monitors and equipment checked, pre-op evaluation and timeout performed Spinal Block Patient position: sitting Prep: DuraPrep and site prepped and draped Patient monitoring: heart rate, continuous pulse ox and blood pressure Approach: right paramedian Location: L3-4 Injection technique: single-shot Needle Needle type: Spinocan  Needle gauge: 25 G Needle length: 9 cm Additional Notes Expiration date of kit checked and confirmed. Patient tolerated procedure well, without complications.

## 2023-04-23 NOTE — Discharge Instructions (Signed)
 Dr. Shizuo Biskup Total Joint Specialist [facility] [address] [place], [place] [address] [phone]  TOTAL KNEE REPLACEMENT POSTOPERATIVE DIRECTIONS    Knee Rehabilitation, Guidelines Following Surgery  Results after knee surgery are often greatly improved when you follow the exercise, range of motion and muscle strengthening exercises prescribed by your doctor. Safety measures are also important to protect the knee from further injury. Any time any of these exercises cause you to have increased pain or swelling in your knee joint, decrease the amount until you are comfortable again and slowly increase them. If you have problems or questions, call your caregiver or physical therapist for advice.   WEIGHT BEARING Weight bearing as tolerated with assist device (walker, cane, etc) as directed, use it as long as suggested by your surgeon or therapist, typically at least 4-6 weeks.  HOME CARE INSTRUCTIONS  Remove items at home which could result in a fall. This includes throw rugs or furniture in walking pathways.  Continue medications as instructed at time of discharge. You may have some home medications which will be placed on hold until you complete the course of blood thinner medication.  You may start showering once you are discharged home but do not submerge the incision under water. Just pat the incision dry and apply a dry gauze dressing on daily. Walk with walker as instructed.  You may resume a sexual relationship in one month or when given the OK by your doctor.  Use walker as long as suggested by your caregivers. Avoid periods of inactivity such as sitting longer than an hour when not asleep. This helps prevent blood clots.  You may put full weight on your legs and walk as much as is comfortable.  You may return to work once you are cleared by your doctor.  Do not drive a car for 6 weeks or until released by you surgeon.  Do not drive while  taking narcotics.  Wear the elastic stockings for three weeks following surgery during the day but you may remove then at night. Make sure you keep all of your appointments after your operation with all of your doctors and caregivers. You should call the office at the above phone number and make an appointment for approximately two weeks after the date of your surgery. Do not remove your surgical dressing. The dressing is waterproof; you may take showers in 3 days, but do not take tub baths or submerge the dressing. Please pick up a stool softener and laxative for home use as long as you are requiring pain medications. ICE to the affected knee every three hours for 30 minutes at a time and then as needed for pain and swelling.  Continue to use ice on the knee for pain and swelling from surgery. You may notice swelling that will progress down to the foot and ankle.  This is normal after surgery.  Elevate the leg when you are not up walking on it.   It is important for you to complete the blood thinner medication as prescribed by your doctor. Continue to use the breathing machine which will help keep your temperature down.  It is common for your temperature to cycle up and down following surgery, especially at night when you are not up moving around and exerting yourself.  The breathing machine keeps your lungs expanded and your temperature down.  RANGE OF MOTION AND STRENGTHENING EXERCISES  Rehabilitation of the knee is important following a knee injury or an   operation. After just a few days of immobilization, the muscles of the thigh which control the knee become weakened and shrink (atrophy). Knee exercises are designed to build up the tone and strength of the thigh muscles and to improve knee motion. Often times heat used for twenty to thirty minutes before working out will loosen up your tissues and help with improving the range of motion but do not use heat for the first two weeks following surgery.  These exercises can be done on a training (exercise) mat, on the floor, on a table or on a bed. Use what ever works the best and is most comfortable for you Knee exercises include:  Leg Lifts - While your knee is still immobilized in a splint or cast, you can do straight leg raises. Lift the leg to 60 degrees, hold for 3 sec, and slowly lower the leg. Repeat 10-20 times 2-3 times daily. Perform this exercise against resistance later as your knee gets better.  Quad and Hamstring Sets - Tighten up the muscle on the front of the thigh (Quad) and hold for 5-10 sec. Repeat this 10-20 times hourly. Hamstring sets are done by pushing the foot backward against an object and holding for 5-10 sec. Repeat as with quad sets.  A rehabilitation program following serious knee injuries can speed recovery and prevent re-injury in the future due to weakened muscles. Contact your doctor or a physical therapist for more information on knee rehabilitation.   POST-OPERATIVE OPIOID TAPER INSTRUCTIONS: It is important to wean off of your opioid medication as soon as possible. If you do not need pain medication after your surgery it is ok to stop day one. Opioids include: Codeine, Hydrocodone(Norco, Vicodin), Oxycodone(Percocet, oxycontin) and hydromorphone amongst others.  Long term and even short term use of opiods can cause: Increased pain response Dependence Constipation Depression Respiratory depression And more.  Withdrawal symptoms can include Flu like symptoms Nausea, vomiting And more Techniques to manage these symptoms Hydrate well Eat regular healthy meals Stay active Use relaxation techniques(deep breathing, meditating, yoga) Do Not substitute Alcohol to help with tapering If you have been on opioids for less than two weeks and do not have pain than it is ok to stop all together.  Plan to wean off of opioids This plan should start within one week post op of your joint replacement. Maintain the same  interval or time between taking each dose and first decrease the dose.  Cut the total daily intake of opioids by one tablet each day Next start to increase the time between doses. The last dose that should be eliminated is the evening dose.    SKILLED REHAB INSTRUCTIONS: If the patient is transferred to a skilled rehab facility following release from the hospital, a list of the current medications will be sent to the facility for the patient to continue.  When discharged from the skilled rehab facility, please have the facility set up the patient's Home Health Physical Therapy prior to being released. Also, the skilled facility will be responsible for providing the patient with their medications at time of release from the facility to include their pain medication, the muscle relaxants, and their blood thinner medication. If the patient is still at the rehab facility at time of the two week follow up appointment, the skilled rehab facility will also need to assist the patient in arranging follow up appointment in our office and any transportation needs.  MAKE SURE YOU:  Understand these instructions.  Will watch   your condition.  Will get help right away if you are not doing well or get worse.    Pick up stool softner and laxative for home use following surgery while on pain medications. Do NOT remove your dressing. You may shower.  Do not take tub baths or submerge incision under water. May shower starting three days after surgery. Please use a clean towel to pat the incision dry following showers. Continue to use ice for pain and swelling after surgery. Do not use any lotions or creams on the incision until instructed by your surgeon.  

## 2023-04-23 NOTE — Interval H&P Note (Signed)
History and Physical Interval Note:  04/23/2023 8:06 AM  Tyler Gates  has presented today for surgery, with the diagnosis of Right knee osteoarthritis.  The various methods of treatment have been discussed with the patient and family. After consideration of risks, benefits and other options for treatment, the patient has consented to  Procedure(s) with comments: COMPUTER ASSISTED TOTAL KNEE ARTHROPLASTY (Right) - 160 as a surgical intervention.  The patient's history has been reviewed, patient examined, no change in status, stable for surgery.  I have reviewed the patient's chart and labs.  Questions were answered to the patient's satisfaction.    The risks, benefits, and alternatives were discussed with the patient. There are risks associated with the surgery including, but not limited to, problems with anesthesia (death), infection, instability (giving out of the joint), dislocation, differences in leg length/angulation/rotation, fracture of bones, loosening or failure of implants, hematoma (blood accumulation) which may require surgical drainage, blood clots, pulmonary embolism, nerve injury (foot drop and lateral thigh numbness), and blood vessel injury. The patient understands these risks and elects to proceed.   Iline Oven Jontue Crumpacker

## 2023-04-23 NOTE — Care Plan (Signed)
Ortho Bundle Case Management Note  Patient Details  Name: Tyler Gates MRN: [id number] Date of Birth: 06-08-1956                  R TKA on 04-23-23  DCP: Home with wife  DME: No needs, has a RW  PT: [place] Kathryne Sharper on 04-28-23   DME Arranged:  N/A DME Agency:       Additional Comments: Please contact me with any questions of if this plan should need to change.   Ennis Forts, RN,CCM [facility]  [phone] 04/23/2023, 12:56 PM

## 2023-04-23 NOTE — Evaluation (Signed)
Physical Therapy Evaluation Patient Details Name: Tyler Gates MRN: [id number] DOB: Dec 29, 1955 Today's Date: 04/23/2023  History of Present Illness  67 yo male presents to therapy s/p R TKA on 04/23/2023 due to failure of conservative measures. Pt has PMH including but not limited to: CKD II, HTN, HDL, crohn's dz and L femur fx s/p hip pin (2018).  Clinical Impression    Tyler Gates is a 67 y.o. male POD 0 s/p R TKA. Patient reports IND with mobility at baseline. Patient is now limited by functional impairments (see PT problem list below) and requires min guard and cues for transfers and gait with RW. Patient was able to ambulate 80 feet with RW and min guard and cues for safe walker management. Patient educated on safe sequencing for stair mobility with B UE support on l handrail, car transfers, pain management and fall risk prevention pt and spouse verbalized understanding of safe guarding position for people assisting with mobility. Patient instructed in exercises to facilitate ROM and circulation reviewed with HO provided. Patient will benefit from continued skilled PT interventions to address impairments and progress towards PLOF. Patient has met mobility goals at adequate level for discharge home with family assist and pt reporting starting OPPT 5/6; will continue to follow if pt continues acute stay to progress towards Mod I goals.      Recommendations for follow up therapy are one component of a multi-disciplinary discharge planning process, led by the attending physician.  Recommendations may be updated based on patient status, additional functional criteria and insurance authorization.  Follow Up Recommendations       Assistance Recommended at Discharge Intermittent Supervision/Assistance  Patient can return home with the following  A little help with walking and/or transfers;A little help with bathing/dressing/bathroom;Assistance with cooking/housework;Assist for transportation;Help  with stairs or ramp for entrance    Equipment Recommendations None recommended by PT (pt reports DME in home setting)  Recommendations for Other Services       Functional Status Assessment Patient has had a recent decline in their functional status and demonstrates the ability to make significant improvements in function in a reasonable and predictable amount of time.     Precautions / Restrictions Precautions Precautions: Knee;Fall Restrictions Weight Bearing Restrictions: No      Mobility  Bed Mobility Overal bed mobility: Needs Assistance Bed Mobility: Supine to Sit     Supine to sit: Supervision     General bed mobility comments: cues for safety    Transfers Overall transfer level: Needs assistance Equipment used: Rolling walker (2 wheels) Transfers: Sit to/from Stand Sit to Stand: Min guard           General transfer comment: cues for proper UE and AD placement    Ambulation/Gait Ambulation/Gait assistance: Min guard Gait Distance (Feet): 80 Feet Assistive device: Rolling walker (2 wheels) Gait Pattern/deviations: Step-to pattern, Antalgic (step almost through) Gait velocity: decreased        Stairs Stairs: Yes Stairs assistance: Min guard Stair Management: One rail Left Number of Stairs: 2 General stair comments: stair training initally with B handrials and progressed to L handrial with B UE support, pt required frequent cues for proepr sequencing and technique  Wheelchair Mobility    Modified Rankin (Stroke Patients Only)       Balance Overall balance assessment: Needs assistance Sitting-balance support: Single extremity supported Sitting balance-Leahy Scale: Good     Standing balance support: Bilateral upper extremity supported, During functional activity, Reliant on assistive device for  balance Standing balance-Leahy Scale: Poor                               Pertinent Vitals/Pain Pain Assessment Pain Assessment:  0-10 Pain Score: 4  Pain Location: R knee Pain Descriptors / Indicators: Aching, Discomfort, Guarding, Pressure Pain Intervention(s): Limited activity within patient's tolerance, Monitored during session, Premedicated before session, Repositioned, Ice applied    Home Living Family/patient expects to be discharged to:: Private residence Living Arrangements: Spouse/significant other Available Help at Discharge: Family Type of Home: House Home Access: Stairs to enter Entrance Stairs-Rails: Left (garage) Entrance Stairs-Number of Steps: 3   Home Layout: One level;Laundry or work area in [employer] Equipment: Agricultural consultant (2 wheels);Cane - single point Technical brewer)      Prior Function Prior Level of Function : Independent/Modified Independent;Driving             Mobility Comments: IND with all ADLs, self care tasks, IADLs, driving       Hand Dominance        Extremity/Trunk Assessment        Lower Extremity Assessment Lower Extremity Assessment: RLE deficits/detail RLE Deficits / Details: ankle DF/PF 5/5; SLR < 10 degree lag RLE Sensation: WNL    Cervical / Trunk Assessment Cervical / Trunk Assessment: Normal  Communication   Communication: No difficulties  Cognition Arousal/Alertness: Awake/alert Behavior During Therapy: WFL for tasks assessed/performed Overall Cognitive Status: Within Functional Limits for tasks assessed                                          General Comments      Exercises Total Joint Exercises Ankle Circles/Pumps: AROM, Both, 20 reps Quad Sets: AROM, Right, 5 reps Heel Slides: AROM, Right, 5 reps Hip ABduction/ADduction: AROM, Right, 5 reps Straight Leg Raises: AROM, Right, 5 reps Long Arc Quad: AROM, Right, 5 reps   Assessment/Plan    PT Assessment Patient needs continued PT services  PT Problem List Decreased strength;Decreased range of motion;Decreased activity tolerance;Decreased balance;Decreased  mobility;Decreased coordination;Decreased cognition;Pain       PT Treatment Interventions DME instruction;Gait training;Stair training;Functional mobility training;Therapeutic activities;Therapeutic exercise;Balance training;Neuromuscular re-education;Patient/family education;Modalities    PT Goals (Current goals can be found in the Care Plan section)  Acute Rehab PT Goals Patient Stated Goal: to be able to walk without pain, cycle again and drive to [place] at the end of the month PT Goal Formulation: With patient Time For Goal Achievement: 05/07/23 Potential to Achieve Goals: Good    Frequency       Co-evaluation               AM-PAC PT "6 Clicks" Mobility  Outcome Measure Help needed turning from your back to your side while in a flat bed without using bedrails?: None Help needed moving from lying on your back to sitting on the side of a flat bed without using bedrails?: A Little Help needed moving to and from a bed to a chair (including a wheelchair)?: A Little Help needed standing up from a chair using your arms (e.g., wheelchair or bedside chair)?: A Little Help needed to walk in hospital room?: A Little Help needed climbing 3-5 steps with a railing? : A Little 6 Click Score: 19    End of Session Equipment Utilized During Treatment: Gait belt Activity Tolerance:  Patient tolerated treatment well Patient left: in chair;with call bell/phone within reach;with family/visitor present Nurse Communication: Mobility status;Other (comment) (rediness from PT standpoint for d/c) PT Visit Diagnosis: Unsteadiness on feet (R26.81);Other abnormalities of gait and mobility (R26.89);Muscle weakness (generalized) (M62.81);Difficulty in walking, not elsewhere classified (R26.2);Pain Pain - Right/Left: Right Pain - part of body: Knee    Time: 1610-9604 PT Time Calculation (min) (ACUTE ONLY): 48 min   Charges:   PT Evaluation $PT Eval Low Complexity: 1 Low PT Treatments $Gait Training:  8-22 mins $Therapeutic Exercise: 8-22 mins         Rica Mote, PT   Jacqualyn Posey 04/23/2023, 2:24 PM

## 2023-04-23 NOTE — Anesthesia Procedure Notes (Signed)
Anesthesia Regional Block: Adductor canal block   Pre-Anesthetic Checklist: , timeout performed,  Correct Patient, Correct Site, Correct Laterality,  Correct Procedure, Correct Position, site marked,  Risks and benefits discussed,  Surgical consent,  Pre-op evaluation,  At surgeon's request and post-op pain management  Laterality: Lower and Right  Prep: chloraprep       Needles:  Injection technique: Single-shot  Needle Type: Stimiplex     Needle Length: 9cm  Needle Gauge: 21     Additional Needles:   Procedures:,,,, ultrasound used (permanent image in chart),,    Narrative:  Start time: 04/23/2023 7:52 AM End time: 04/23/2023 8:12 AM Injection made incrementally with aspirations every 5 mL.  Performed by: Personally  Anesthesiologist: Lewie Loron, MD  Additional Notes: BP cuff, EKG monitors applied. Sedation begun. Artery and nerve location verified with ultrasound. Anesthetic injected incrementally (5ml), slowly, and after negative aspirations under direct u/s guidance. Good fascial/perineural spread. Tolerated well.

## 2023-04-23 NOTE — Transfer of Care (Signed)
Immediate Anesthesia Transfer of Care Note  Patient: Tyler Gates  Procedure(s) Performed: COMPUTER ASSISTED TOTAL KNEE ARTHROPLASTY (Right: Knee)  Patient Location: PACU  Anesthesia Type:Spinal  Level of Consciousness: drowsy and responds to stimulation  Airway & Oxygen Therapy: Patient Spontanous Breathing with face mask 02  Post-op Assessment: Report given to RN and Post -op Vital signs reviewed and stable  Post vital signs: Reviewed and stable  Last Vitals:  Vitals Value Taken Time  BP 107/56 04/23/23 1100  Temp    Pulse 75 04/23/23 1104  Resp 20 04/23/23 1104  SpO2 95 % 04/23/23 1104  Vitals shown include unvalidated device data.  Last Pain:  Vitals:   04/23/23 0645  TempSrc:   PainSc: 0-No pain         Complications: No notable events documented.

## 2023-04-23 NOTE — Anesthesia Postprocedure Evaluation (Signed)
Anesthesia Post Note  Patient: Tyler Gates  Procedure(s) Performed: COMPUTER ASSISTED TOTAL KNEE ARTHROPLASTY (Right: Knee)     Patient location during evaluation: PACU Anesthesia Type: Spinal Level of consciousness: awake and alert Pain management: pain level controlled Vital Signs Assessment: post-procedure vital signs reviewed and stable Respiratory status: spontaneous breathing Cardiovascular status: stable Anesthetic complications: no   No notable events documented.  Last Vitals:  Vitals:   04/23/23 1145 04/23/23 1200  BP: 108/75 (!) 93/59  Pulse: 65   Resp: 14   Temp: 36.5 C   SpO2: 99% 94%    Last Pain:  Vitals:   04/23/23 1200  TempSrc:   PainSc: 4                  Janai Maudlin [employer]

## 2023-04-24 ENCOUNTER — Encounter (HOSPITAL_COMMUNITY): Payer: Self-pay | Admitting: Orthopedic Surgery

## 2023-04-25 ENCOUNTER — Other Ambulatory Visit: Payer: Self-pay | Admitting: Family Medicine

## 2023-04-25 DIAGNOSIS — F3340 Major depressive disorder, recurrent, in remission, unspecified: Secondary | ICD-10-CM

## 2023-04-28 ENCOUNTER — Encounter: Payer: Self-pay | Admitting: Rehabilitative and Restorative Service Providers"

## 2023-04-28 ENCOUNTER — Other Ambulatory Visit: Payer: Self-pay

## 2023-04-28 ENCOUNTER — Ambulatory Visit: Payer: Medicare HMO | Attending: Orthopedic Surgery | Admitting: Rehabilitative and Restorative Service Providers"

## 2023-04-28 DIAGNOSIS — M6281 Muscle weakness (generalized): Secondary | ICD-10-CM | POA: Diagnosis not present

## 2023-04-28 DIAGNOSIS — R2689 Other abnormalities of gait and mobility: Secondary | ICD-10-CM | POA: Insufficient documentation

## 2023-04-28 DIAGNOSIS — M25561 Pain in right knee: Secondary | ICD-10-CM | POA: Diagnosis not present

## 2023-04-28 DIAGNOSIS — M2560 Stiffness of unspecified joint, not elsewhere classified: Secondary | ICD-10-CM

## 2023-04-28 DIAGNOSIS — R600 Localized edema: Secondary | ICD-10-CM | POA: Diagnosis not present

## 2023-04-28 NOTE — Therapy (Addendum)
OUTPATIENT PHYSICAL THERAPY LOWER EXTREMITY EVALUATION   Patient Name: Tyler Gates MRN: [id number] DOB:1956/11/11, 67 y.o., male Today's Date: 04/28/2023  END OF SESSION:   Past Medical History:  Diagnosis Date   Arthritis    Crohn's disease (HCC)    Depression    Dyspnea on exertion    GERD (gastroesophageal reflux disease)    Herpes    History of kidney stones    Hyperlipidemia    Hypertension    Instability of knee joint    Left displaced femoral neck fracture (HCC) 07/11/2017   Snoring    Past Surgical History:  Procedure Laterality Date   COLONOSCOPY  2007   Dr. Ozzie Hoyle normal TI, ascending colon/cecum with mild inflammation noted but biopses without signs of active inflammation, hyperpastic polyp, possible ischemia on path of left colon biopsy   COLONOSCOPY N/A 11/08/2015   Procedure: COLONOSCOPY;  Surgeon: Corbin Ade, MD;  Location: [facility];  Service: Endoscopy;  Laterality: N/A;   ESOPHAGOGASTRODUODENOSCOPY  2007   Dr. Ozzie Hoyle reflux esophagitis, hiatal hernia, negative H.pylori   ESOPHAGOGASTRODUODENOSCOPY N/A 11/08/2015   Procedure: ESOPHAGOGASTRODUODENOSCOPY (EGD);  Surgeon: Corbin Ade, MD;  Location: [facility];  Service: Endoscopy;  Laterality: N/A;  0830 - moved to 8:15 - office to notify   HIP PINNING,CANNULATED Left 07/12/2017   Procedure: CANNULATED HIP PINNING LEFT;  Surgeon: Samson Frederic, MD;  Location: [facility];  Service: Orthopedics;  Laterality: Left;   KNEE ARTHROPLASTY Right 04/23/2023   Procedure: COMPUTER ASSISTED TOTAL KNEE ARTHROPLASTY;  Surgeon: Samson Frederic, MD;  Location: [facility];  Service: Orthopedics;  Laterality: Right;  160   Patient Active Problem List   Diagnosis Date Noted   Osteoarthritis of right knee 04/23/2023   Preoperative clearance 02/11/2023   Chronic sinusitis 03/12/2022   Visual disturbance 03/12/2022   Acute sinusitis 01/29/2022   Chronic kidney disease (CKD) stage G2/A2, mildly decreased glomerular filtration  rate (GFR) between 60-89 mL/min/1.73 square meter and albuminuria creatinine ratio between 30-299 mg/g 10/22/2018   Hypogonadism male 10/21/2018   Screening PSA (prostate specific antigen) 10/21/2018   Chronic kidney disease due to benign hypertension 10/21/2018   Depression, major, recurrent, in remission (HCC) 10/21/2018   Statin intolerance 09/14/2018   Agatston coronary artery calcium score greater than 400 08/10/2018   Family history of coronary artery disease 08/10/2018   Crohn's disease of colon without complication (HCC) 05/09/2017   Mucosal abnormality of esophagus    Hiatal hernia    KNEE JOINT INSTABILITY 03/07/2007   Hyperlipidemia 12/30/2006   Essential hypertension 12/30/2006   GERD 12/30/2006    PCP: Dr Everrett Coombe  REFERRING PROVIDER: Dr Samson Frederic   REFERRING DIAG: Rt TKA  THERAPY DIAG:  No diagnosis found.  Rationale for Evaluation and Treatment: Rehabilitation  ONSET DATE: 04/23/23  SUBJECTIVE:   SUBJECTIVE STATEMENT: Patient reports Rt knee pain over the past 18 months with symptoms gradually increasing   PERTINENT HISTORY: HTN; Crones disease; fx of Lt femoral neck with ORIF ~ 4 yrs ago PAIN:  Are you having pain? Yes: NPRS scale: 7-8/10; am 3/10  Pain location: Rt knee pain  Pain description: constant pressure  Aggravating factors: swelling; walking   Relieving factors: meds; ice; rest   PRECAUTIONS: Knee  WEIGHT BEARING RESTRICTIONS: No  FALLS:  Has patient fallen in last 6 months? 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; avid cyclist ~ 2-3 times per week ~ 40 min; yard work; some household chores; race slot cars   PLOF: Independent  PATIENT GOALS: return all normal activities  NEXT MD  VISIT: 05/06/23  OBJECTIVE:   DIAGNOSTIC FINDINGS: 04/23/23 xray of Rt knee - Acute surgical changes of total knee arthroplasty   PATIENT SURVEYS:  Not completed   COGNITION: Overall cognitive status: Within functional limits for tasks assessed     SENSATION: WFL  EDEMA:  Moderate Rt knee  Rt knee at joint line 42 cm; left 38.5 cm  MUSCLE LENGTH: Hamstrings: Right 65 deg; Left 70 deg Hip flexors; ITB tight bilat Rt > Lt   POSTURE: rounded shoulders, forward head, flexed trunk , and weight shift left  PALPATION:   LOWER EXTREMITY ROM:  Active ROM Right eval Left eval  Hip flexion    Hip extension    Hip abduction    Hip adduction    Hip internal rotation    Hip external rotation    Knee flexion 90   Knee extension -12   Ankle dorsiflexion    Ankle plantarflexion    Ankle inversion    Ankle eversion     (Blank rows = not tested)  LOWER EXTREMITY MMT: strength Lt LE - WNL's   MMT Right eval Left eval  Hip flexion 4+   Hip extension 4   Hip abduction 4   Hip adduction    Hip internal rotation    Hip external rotation    Knee flexion NT   Knee extension NT   Ankle dorsiflexion    Ankle plantarflexion    Ankle inversion    Ankle eversion     (Blank rows = not tested)  FUNCTIONAL TESTS:  5 times sit to stand: to be assessed   GAIT: Distance walked: 40 Assistive device utilized: Environmental consultant - 2 wheeled Level of assistance: Complete Independence Comments: limp Rt LE with wt bearing Rt LE   [facility] Adult PT Treatment:                                                DATE: 04/28/23 Therapeutic Exercise: Supine  Quad set 5 sec x 5; repeated with heel on foam roll 5 sec x 5  Hamstring stretch 30 sec x 3  ITB stretch 30 sec x 2 Heel slide with strap 10 sec x 10  Standing  Knee extension; weight shift with knee extension  Manual Therapy:  Neuromuscular re-ed:  Therapeutic Activity:  Gait:  Modalities: Vaso Rt knee 34 deg, low pressure x 15 min  Self  Care: Education re- elevation; use of ice; exercise    PATIENT EDUCATION:  Education details: HEP; POC  Person educated: Patient Education method: Explanation, Demonstration, Tactile cues, Verbal cues, and Handouts Education comprehension: verbalized understanding, returned demonstration, verbal cues required, tactile cues required, and needs further education  HOME EXERCISE PROGRAM:  Access Code: [email or web address] URL: [email or web address] Date: 04/28/2023 Prepared by: Corlis Leak  Exercises - Supine Quad Set  - 2 x daily - 7 x weekly - 1-2 sets - 10 reps - 3 sec  hold - Hooklying Hamstring Stretch with Strap  - 2 x daily - 7 x weekly - 1 sets - 3 reps -  30 sec  hold - Supine ITB Stretch with Strap  - 2 x daily - 7 x weekly - 1 sets - 3 reps - 30 sec  hold - Supine Heel Slide with Strap  - 2 x daily - 7 x weekly - 1 sets - 10 reps - 10 sec  hold - Standing Anterior Posterior Weight Shift with Chair  - 2 x daily - 7 x weekly - 1-2 sets - 1-020 reps - 5-10 sec  hold - Ankle Pumps in Elevation  - 2 x daily - 7 x weekly - 1 sets - 10-20 reps - Ankle Circles in Elevation  - 2 x daily - 7 x weekly - 1 sets - 10-15 reps - Ankle Alphabet in Elevation  - 2 x daily - 7 x weekly - 1 sets - 3 reps  ASSESSMENT:  CLINICAL IMPRESSION: Patient is a 67 y.o. male who was seen today for physical therapy evaluation and treatment following Rt TKA 04/23/23. He is doing well post op with expected pain and edema. He presents with abnormal gait; decreased ROM, strength, function Rt LE; edema Rt knee; pain on a daily basis; decreased ADL's and functional activity tolerance. Patient will benefit from PT to address problems identified.   OBJECTIVE IMPAIRMENTS: Abnormal gait, decreased activity tolerance, decreased balance, decreased mobility, difficulty walking, decreased ROM, decreased strength, increased edema, impaired flexibility, improper body mechanics, postural dysfunction, and pain.   ACTIVITY  LIMITATIONS: carrying, lifting, bending, sitting, standing, squatting, sleeping, stairs, transfers, bed mobility, bathing, toileting, dressing, and locomotion level  PARTICIPATION LIMITATIONS: meal prep, cleaning, laundry, driving, shopping, community activity, and yard work  PERSONAL FACTORS: Past/current experiences are also affecting patient's functional outcome.   REHAB POTENTIAL: Good  CLINICAL DECISION MAKING: Stable/uncomplicated  EVALUATION COMPLEXITY: Low   GOALS: Goals reviewed with patient? Yes  SHORT TERM GOALS: Target date: 06/09/2023  Independent in initial HEP Baseline: Goal status: INITIAL  2.  Increase knee ROM to 0 degrees extension and 110 degrees flexion  Baseline:  Goal status: INITIAL   LONG TERM GOALS: Target date: 07/21/2023   Independent in gait without assistive device with good gait pattern Baseline:  Goal status: INITIAL  2.  AROM Rt knee 0 degrees extension to 125 deg flexion  Baseline:  Goal status: INITIAL  3.  5/5 strength Rt LE  Baseline:  Goal status: INITIAL  4.  Patient reports return to normal functional activities and schedule at home  Baseline:  Goal status: INITIAL  5.  Independent in HEP including aquatic program as indicated  Baseline:  Goal status: INITIAL   PLAN:  PT FREQUENCY: 1-2x/week  PT DURATION: 12 weeks  PLANNED INTERVENTIONS: Therapeutic exercises, Therapeutic activity, Neuromuscular re-education, Balance training, Gait training, Patient/Family education, Self Care, Joint mobilization, Stair training, Aquatic Therapy, Dry Needling, Electrical stimulation, Cryotherapy, Moist heat, Taping, Vasopneumatic device, Ultrasound, Ionotophoresis 4mg /ml Dexamethasone, Manual therapy, and Re-evaluation  PLAN FOR NEXT SESSION: review and progress with exercises; gait training including stairs; manual work, DN, modalities as indicated   PRINT or E-MAIL EXERCISES AT NEXT VISIT   Val Riles, PT 04/28/2023, 9:36 AM

## 2023-05-01 ENCOUNTER — Ambulatory Visit: Payer: Medicare HMO

## 2023-05-01 DIAGNOSIS — M25561 Pain in right knee: Secondary | ICD-10-CM | POA: Diagnosis not present

## 2023-05-01 DIAGNOSIS — M2560 Stiffness of unspecified joint, not elsewhere classified: Secondary | ICD-10-CM | POA: Diagnosis not present

## 2023-05-01 DIAGNOSIS — R600 Localized edema: Secondary | ICD-10-CM

## 2023-05-01 DIAGNOSIS — M6281 Muscle weakness (generalized): Secondary | ICD-10-CM | POA: Diagnosis not present

## 2023-05-01 DIAGNOSIS — R2689 Other abnormalities of gait and mobility: Secondary | ICD-10-CM | POA: Diagnosis not present

## 2023-05-01 NOTE — Therapy (Signed)
OUTPATIENT PHYSICAL THERAPY LOWER EXTREMITY TREATMENT   Patient Name: Tyler Gates MRN: [id number] DOB:April 08, 1956, 67 y.o., male Today's Date: 05/01/2023  END OF SESSION:  PT End of Session - 05/01/23 1104     Visit Number 2    Number of Visits 24    Date for PT Re-Evaluation 07/21/23    Authorization Type 12 VISITS APPROVED FOR PT 04/28/2023-07/21/2023    Authorization - Visit Number 2    Authorization - Number of Visits 12    PT Start Time 1105    PT Stop Time 1200    PT Time Calculation (min) 55 min    Activity Tolerance Patient tolerated treatment well    Behavior During Therapy WFL for tasks assessed/performed             Past Medical History:  Diagnosis Date   Arthritis    Crohn's disease (HCC)    Depression    Dyspnea on exertion    GERD (gastroesophageal reflux disease)    Herpes    History of kidney stones    Hyperlipidemia    Hypertension    Instability of knee joint    Left displaced femoral neck fracture (HCC) 07/11/2017   Snoring    Past Surgical History:  Procedure Laterality Date   COLONOSCOPY  2007   Dr. Ozzie Hoyle normal TI, ascending colon/cecum with mild inflammation noted but biopses without signs of active inflammation, hyperpastic polyp, possible ischemia on path of left colon biopsy   COLONOSCOPY N/A 11/08/2015   Procedure: COLONOSCOPY;  Surgeon: Corbin Ade, MD;  Location: [facility];  Service: Endoscopy;  Laterality: N/A;   ESOPHAGOGASTRODUODENOSCOPY  2007   Dr. Ozzie Hoyle reflux esophagitis, hiatal hernia, negative H.pylori   ESOPHAGOGASTRODUODENOSCOPY N/A 11/08/2015   Procedure: ESOPHAGOGASTRODUODENOSCOPY (EGD);  Surgeon: Corbin Ade, MD;  Location: [facility];  Service: Endoscopy;  Laterality: N/A;  0830 - moved to 8:15 - office to notify   HIP PINNING,CANNULATED Left 07/12/2017   Procedure: CANNULATED HIP PINNING LEFT;  Surgeon: Samson Frederic, MD;  Location: [facility];  Service: Orthopedics;  Laterality: Left;   KNEE ARTHROPLASTY Right  04/23/2023   Procedure: COMPUTER ASSISTED TOTAL KNEE ARTHROPLASTY;  Surgeon: Samson Frederic, MD;  Location: [facility];  Service: Orthopedics;  Laterality: Right;  160   Patient Active Problem List   Diagnosis Date Noted   Osteoarthritis of right knee 04/23/2023   Preoperative clearance 02/11/2023   Chronic sinusitis 03/12/2022   Visual disturbance 03/12/2022   Acute sinusitis 01/29/2022   Chronic kidney disease (CKD) stage G2/A2, mildly decreased glomerular filtration rate (GFR) between 60-89 mL/min/1.73 square meter and albuminuria creatinine ratio between 30-299 mg/g 10/22/2018   Hypogonadism male 10/21/2018   Screening PSA (prostate specific antigen) 10/21/2018   Chronic kidney disease due to benign hypertension 10/21/2018   Depression, major, recurrent, in remission (HCC) 10/21/2018   Statin intolerance 09/14/2018   Agatston coronary artery calcium score greater than 400 08/10/2018   Family history of coronary artery disease 08/10/2018   Crohn's disease of colon without complication (HCC) 05/09/2017   Mucosal abnormality of esophagus    Hiatal hernia    KNEE JOINT INSTABILITY 03/07/2007   Hyperlipidemia 12/30/2006   Essential hypertension 12/30/2006   GERD 12/30/2006    PCP: Dr Everrett Coombe  REFERRING PROVIDER: Dr Samson Frederic   REFERRING DIAG: Rt TKA  THERAPY DIAG:  Acute pain of right knee  Stiffness in joint  Other abnormalities of gait and mobility  Muscle weakness (generalized)  Localized edema  Rationale for Evaluation and Treatment: Rehabilitation  ONSET DATE: 04/23/23  SUBJECTIVE:   SUBJECTIVE STATEMENT: Patient reports 5/10 pain in knee today, states he feels tightness in piriformis/glutes and outer calf.   PERTINENT HISTORY: HTN; Crones disease; fx of Lt femoral neck with ORIF ~ 4 yrs ago PAIN:  Are you having pain? Yes: NPRS scale: 7-8/10; am 3/10  Pain location: Rt knee pain  Pain description: constant pressure  Aggravating factors: swelling;  walking   Relieving factors: meds; ice; rest   PRECAUTIONS: Knee  WEIGHT BEARING RESTRICTIONS: No  FALLS:  Has patient fallen in last 6 months? 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; avid cyclist ~ 2-3 times per week ~ 40 min; yard work; some household chores; race slot cars   PLOF: Independent  PATIENT GOALS: return all normal activities  NEXT MD VISIT: 05/06/23  OBJECTIVE:   DIAGNOSTIC FINDINGS: 04/23/23 xray of Rt knee - Acute surgical changes of total knee arthroplasty   PATIENT SURVEYS:  Not completed   COGNITION: Overall cognitive status: Within functional limits for tasks assessed     SENSATION: WFL  EDEMA:  Moderate Rt knee  Rt knee at joint line 42 cm; left 38.5 cm  MUSCLE LENGTH: Hamstrings: Right 65 deg; Left 70 deg Hip flexors; ITB tight bilat Rt > Lt   POSTURE: rounded shoulders, forward head, flexed trunk , and weight shift left  PALPATION:   LOWER EXTREMITY ROM:  Active ROM Right eval Left eval  Hip flexion    Hip extension    Hip abduction    Hip adduction    Hip internal rotation    Hip external rotation    Knee flexion 90   Knee extension -12   Ankle dorsiflexion    Ankle plantarflexion    Ankle inversion    Ankle eversion     (Blank rows = not tested)  LOWER EXTREMITY MMT: strength Lt LE - WNL's   MMT Right eval Left eval  Hip flexion 4+   Hip extension 4   Hip abduction 4   Hip adduction    Hip internal rotation    Hip external rotation    Knee flexion NT   Knee extension NT   Ankle dorsiflexion    Ankle plantarflexion    Ankle inversion    Ankle eversion     (Blank rows = not tested)  FUNCTIONAL TESTS:  5 times sit to stand: to be assessed   GAIT: Distance walked:  40 Assistive device utilized: Environmental consultant - 2 wheeled Level of assistance: Complete Independence Comments: limp Rt LE with wt bearing Rt LE   [facility] Adult PT Treatment:                                                DATE: 05/01/2023 Therapeutic Exercise: Recumbent bike partial revolutions x Mat table: HS/ITB/ADD stretches 2x30" each AAROM heel slides w/strap 10x10" Quad set 8x5" SAQ (green bolster) 8x5" Therapeutic Activity: Fwd walking FWW - focus on dynamic knee flexion/heel strike --> bkwd stepping Gait training with SPC Toe taps on 4" step --> forward weight shifting  Modalities: Vaso  34 degrees, med compression x 10 min    [facility] Adult PT Treatment:                                                DATE: 04/28/23 Therapeutic Exercise: Supine  Quad set 5 sec x 5; repeated with heel on foam roll 5 sec x 5  Hamstring stretch 30 sec x 3  ITB stretch 30 sec x 2 Heel slide with strap 10 sec x 10  Standing  Knee extension; weight shift with knee extension Modalities: Vaso Rt knee 34 deg, low pressure x 15 min  Self Care: Education re- elevation; use of ice; exercise    PATIENT EDUCATION:  Education details: HEP; POC  Person educated: Patient Education method: Explanation, Demonstration, Tactile cues, Verbal cues, and Handouts Education comprehension: verbalized understanding, returned demonstration, verbal cues required, tactile cues required, and needs further education  HOME EXERCISE PROGRAM:  Access Code: [email or web address] URL: [email or web address] Date: 04/28/2023 Prepared by: Corlis Leak  Exercises - Supine Quad Set  - 2 x daily - 7 x weekly - 1-2 sets - 10 reps - 3 sec  hold - Hooklying Hamstring Stretch with Strap  - 2 x daily - 7 x weekly - 1 sets - 3 reps - 30 sec  hold - Supine ITB Stretch with Strap  - 2 x daily - 7 x weekly - 1 sets - 3 reps - 30 sec  hold - Supine Heel Slide with Strap  - 2 x daily - 7 x weekly - 1 sets - 10 reps - 10 sec  hold - Standing  Anterior Posterior Weight Shift with Chair  - 2 x daily - 7 x weekly - 1-2 sets - 1-020 reps - 5-10 sec  hold - Ankle Pumps in Elevation  - 2 x daily - 7 x weekly - 1 sets - 10-20 reps - Ankle Circles in Elevation  - 2 x daily - 7 x weekly - 1 sets - 10-15 reps - Ankle Alphabet in Elevation  - 2 x daily - 7 x weekly - 1 sets - 3 reps  ASSESSMENT:  CLINICAL IMPRESSION: Gait training performed to progress patient from FWW to [facility]. Dynamic balance progressed with alternating toe taps and backwards stepping. Dynamic knee flexion improved with prolonged walking time.   OBJECTIVE IMPAIRMENTS: Abnormal gait, decreased activity tolerance, decreased balance, decreased mobility, difficulty walking, decreased ROM, decreased strength, increased edema, impaired flexibility, improper body mechanics, postural dysfunction, and pain.   ACTIVITY LIMITATIONS: carrying, lifting, bending, sitting, standing, squatting, sleeping, stairs, transfers, bed mobility, bathing, toileting, dressing, and locomotion level  PARTICIPATION LIMITATIONS: meal prep, cleaning, laundry, driving, shopping, community activity, and yard work  PERSONAL FACTORS: Past/current experiences are also affecting patient's functional outcome.   REHAB POTENTIAL: Good  CLINICAL DECISION MAKING: Stable/uncomplicated  EVALUATION COMPLEXITY: Low   GOALS: Goals reviewed with patient? Yes  SHORT TERM GOALS: Target date: 06/09/2023  Independent in initial HEP Baseline: Goal status: INITIAL  2.  Increase knee ROM to 0 degrees extension and 110 degrees flexion  Baseline:  Goal status: INITIAL   LONG TERM GOALS: Target date: 07/21/2023   Independent in gait without assistive device with good gait pattern Baseline:  Goal status: INITIAL  2.  AROM Rt knee 0 degrees extension to 125 deg flexion  Baseline:  Goal status: INITIAL  3.  5/5  strength Rt LE  Baseline:  Goal status: INITIAL  4.  Patient reports return to normal functional  activities and schedule at home  Baseline:  Goal status: INITIAL  5.  Independent in HEP including aquatic program as indicated  Baseline:  Goal status: INITIAL   PLAN:  PT FREQUENCY: 1-2x/week  PT DURATION: 12 weeks  PLANNED INTERVENTIONS: Therapeutic exercises, Therapeutic activity, Neuromuscular re-education, Balance training, Gait training, Patient/Family education, Self Care, Joint mobilization, Stair training, Aquatic Therapy, Dry Needling, Electrical stimulation, Cryotherapy, Moist heat, Taping, Vasopneumatic device, Ultrasound, Ionotophoresis 4mg /ml Dexamethasone, Manual therapy, and Re-evaluation  PLAN FOR NEXT SESSION: review and progress with exercises; gait training including stairs; manual work, DN, modalities as indicated   PRINT EXERCISES AT NEXT VISIT   Sanjuana Mae, PTA 05/01/2023, 12:42 PM

## 2023-05-02 ENCOUNTER — Other Ambulatory Visit: Payer: Self-pay | Admitting: Cardiology

## 2023-05-02 DIAGNOSIS — E785 Hyperlipidemia, unspecified: Secondary | ICD-10-CM

## 2023-05-06 ENCOUNTER — Ambulatory Visit: Payer: Medicare HMO | Admitting: Rehabilitative and Restorative Service Providers"

## 2023-05-06 DIAGNOSIS — Z471 Aftercare following joint replacement surgery: Secondary | ICD-10-CM | POA: Diagnosis not present

## 2023-05-06 DIAGNOSIS — Z96651 Presence of right artificial knee joint: Secondary | ICD-10-CM | POA: Diagnosis not present

## 2023-05-08 ENCOUNTER — Ambulatory Visit: Payer: Medicare HMO

## 2023-05-08 DIAGNOSIS — R2689 Other abnormalities of gait and mobility: Secondary | ICD-10-CM | POA: Diagnosis not present

## 2023-05-08 DIAGNOSIS — M6281 Muscle weakness (generalized): Secondary | ICD-10-CM | POA: Diagnosis not present

## 2023-05-08 DIAGNOSIS — M2560 Stiffness of unspecified joint, not elsewhere classified: Secondary | ICD-10-CM | POA: Diagnosis not present

## 2023-05-08 DIAGNOSIS — R600 Localized edema: Secondary | ICD-10-CM

## 2023-05-08 DIAGNOSIS — M25561 Pain in right knee: Secondary | ICD-10-CM

## 2023-05-08 NOTE — Therapy (Signed)
OUTPATIENT PHYSICAL THERAPY LOWER EXTREMITY TREATMENT   Patient Name: Tyler Gates MRN: [id number] DOB:[birth year], 67 y.o., male Today's Date: 05/08/2023  END OF SESSION:  PT End of Session - 05/08/23 1317     Visit Number 3    Number of Visits 12    Date for PT Re-Evaluation 07/21/23    Authorization Type 12 VISITS APPROVED FOR PT 04/28/2023-07/21/2023    Authorization - Visit Number 3    Authorization - Number of Visits 12    PT Start Time 1315    PT Stop Time 1355    PT Time Calculation (min) 40 min    Activity Tolerance Patient tolerated treatment well    Behavior During Therapy WFL for tasks assessed/performed             Past Medical History:  Diagnosis Date   Arthritis    Crohn's disease (HCC)    Depression    Dyspnea on exertion    GERD (gastroesophageal reflux disease)    Herpes    History of kidney stones    Hyperlipidemia    Hypertension    Instability of knee joint    Left displaced femoral neck fracture (HCC) 07/11/2017   Snoring    Past Surgical History:  Procedure Laterality Date   COLONOSCOPY  2007   Dr. Ozzie Hoyle normal TI, ascending colon/cecum with mild inflammation noted but biopses without signs of active inflammation, hyperpastic polyp, possible ischemia on path of left colon biopsy   COLONOSCOPY N/A 11/08/2015   Procedure: COLONOSCOPY;  Surgeon: Corbin Ade, MD;  Location: [facility];  Service: Endoscopy;  Laterality: N/A;   ESOPHAGOGASTRODUODENOSCOPY  2007   Dr. Ozzie Hoyle reflux esophagitis, hiatal hernia, negative H.pylori   ESOPHAGOGASTRODUODENOSCOPY N/A 11/08/2015   Procedure: ESOPHAGOGASTRODUODENOSCOPY (EGD);  Surgeon: Corbin Ade, MD;  Location: [facility];  Service: Endoscopy;  Laterality: N/A;  0830 - moved to 8:15 - office to notify   HIP PINNING,CANNULATED Left 07/12/2017   Procedure: CANNULATED HIP PINNING LEFT;  Surgeon: Samson Frederic, MD;  Location: [facility];  Service: Orthopedics;  Laterality: Left;   KNEE ARTHROPLASTY  Right 04/23/2023   Procedure: COMPUTER ASSISTED TOTAL KNEE ARTHROPLASTY;  Surgeon: Samson Frederic, MD;  Location: [facility];  Service: Orthopedics;  Laterality: Right;  160   Patient Active Problem List   Diagnosis Date Noted   Osteoarthritis of right knee 04/23/2023   Preoperative clearance 02/11/2023   Chronic sinusitis 03/12/2022   Visual disturbance 03/12/2022   Acute sinusitis 01/29/2022   Chronic kidney disease (CKD) stage G2/A2, mildly decreased glomerular filtration rate (GFR) between 60-89 mL/min/1.73 square meter and albuminuria creatinine ratio between 30-299 mg/g 10/22/2018   Hypogonadism male 10/21/2018   Screening PSA (prostate specific antigen) 10/21/2018   Chronic kidney disease due to benign hypertension 10/21/2018   Depression, major, recurrent, in remission (HCC) 10/21/2018   Statin intolerance 09/14/2018   Agatston coronary artery calcium score greater than 400 08/10/2018   Family history of coronary artery disease 08/10/2018   Crohn's disease of colon without complication (HCC) 05/09/2017   Mucosal abnormality of esophagus    Hiatal hernia    KNEE JOINT INSTABILITY 03/07/2007   Hyperlipidemia 12/30/2006   Essential hypertension 12/30/2006   GERD 12/30/2006    PCP: Dr Everrett Coombe  REFERRING PROVIDER: Dr Samson Frederic   REFERRING DIAG: Rt TKA  THERAPY DIAG:  Acute pain of right knee  Stiffness in joint  Other abnormalities of gait and mobility  Muscle weakness (generalized)  Localized edema  Rationale for Evaluation and Treatment: Rehabilitation  ONSET DATE: 04/23/23  SUBJECTIVE:   SUBJECTIVE STATEMENT: Patient reports 2/10 pain in knee today; states he received good report from MD post surgery. Patient states he was told by MD his hip bursa is inflamed which is causing his hip pain.   PERTINENT HISTORY: HTN; Crones disease; fx of Lt femoral neck with ORIF ~ 4 yrs ago PAIN:  Are you having pain? Yes: NPRS scale: 7-8/10; am 3/10  Pain location:  Rt knee pain  Pain description: constant pressure  Aggravating factors: swelling; walking   Relieving factors: meds; ice; rest   PRECAUTIONS: Knee  WEIGHT BEARING RESTRICTIONS: No  FALLS:  Has patient fallen in last 6 months? 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; avid cyclist ~ 2-3 times per week ~ 40 min; yard work; some household chores; race slot cars   PLOF: Independent  PATIENT GOALS: return all normal activities  NEXT MD VISIT: June 2024  OBJECTIVE:   DIAGNOSTIC FINDINGS: 04/23/23 xray of Rt knee - Acute surgical changes of total knee arthroplasty   PATIENT SURVEYS:  Not completed   COGNITION: Overall cognitive status: Within functional limits for tasks assessed     SENSATION: WFL  EDEMA:  Moderate Rt knee  Rt knee at joint line 42 cm; left 38.5 cm  MUSCLE LENGTH: Hamstrings: Right 65 deg; Left 70 deg Hip flexors; ITB tight bilat Rt > Lt   POSTURE: rounded shoulders, forward head, flexed trunk , and weight shift left  PALPATION:   LOWER EXTREMITY ROM:  Active ROM Right eval Left eval Right 05/08/23  Hip flexion     Hip extension     Hip abduction     Hip adduction     Hip internal rotation     Hip external rotation     Knee flexion 90  113  Knee extension -12  -4  Ankle dorsiflexion     Ankle plantarflexion     Ankle inversion     Ankle eversion      (Blank rows = not tested)  LOWER EXTREMITY MMT: strength Lt LE - WNL's   MMT Right eval Left eval  Hip flexion 4+   Hip extension 4   Hip abduction 4   Hip adduction    Hip internal rotation    Hip external rotation    Knee flexion NT   Knee extension NT   Ankle dorsiflexion    Ankle plantarflexion    Ankle inversion    Ankle eversion      (Blank rows = not tested)  FUNCTIONAL TESTS:  5 times sit to stand: to be assessed   GAIT: Distance walked: 40 Assistive device utilized: Environmental consultant - 2 wheeled Level of assistance: Complete Independence Comments: limp Rt LE with wt bearing Rt LE   [facility] Adult PT Treatment:                                                DATE: 05/08/2023 Therapeutic Exercise: Recumbent bike L1 x 5 min Mat Table: HS/ITB w/strap AAROM heel slides w/strap 10x5" Myofascial release piriformis with tennis ball sitting -->  supine Quad set x10 SAQ green bolster 10x5" Standing: Gastroc/soleus stretch A/P & lateral weight shifting Heel raises Gastroc stretch on slant board    [facility] Adult PT Treatment:                                                DATE: 05/01/2023 Therapeutic Exercise: Recumbent bike partial revolutions x Mat table: HS/ITB/ADD stretches 2x30" each AAROM heel slides w/strap 10x10" Quad set 8x5" SAQ (green bolster) 8x5" Therapeutic Activity: Fwd walking FWW - focus on dynamic knee flexion/heel strike --> bkwd stepping Gait training with SPC Toe taps on 4" step --> forward weight shifting  Modalities: Vaso 34 degrees, med compression x 10 min    [facility] Adult PT Treatment:                                                DATE: 04/28/23 Therapeutic Exercise: Supine  Quad set 5 sec x 5; repeated with heel on foam roll 5 sec x 5  Hamstring stretch 30 sec x 3  ITB stretch 30 sec x 2 Heel slide with strap 10 sec x 10  Standing  Knee extension; weight shift with knee extension Modalities: Vaso Rt knee 34 deg, low pressure x 15 min  Self Care: Education re- elevation; use of ice; exercise    PATIENT EDUCATION:  Education details: HEP; POC  Person educated: Patient Education method: Explanation, Demonstration, Tactile cues, Verbal cues, and Handouts Education comprehension: verbalized understanding, returned demonstration, verbal cues required, tactile cues required, and needs  further education  HOME EXERCISE PROGRAM: Access Code: [email or web address] URL: [email or web address] Date: 05/08/2023 Prepared by: Carlynn Herald  Exercises - Supine Quad Set  - 2 x daily - 7 x weekly - 1-2 sets - 10 reps - 3 sec  hold - Hooklying Hamstring Stretch with Strap  - 2 x daily - 7 x weekly - 1 sets - 3 reps - 30 sec  hold - Supine ITB Stretch with Strap  - 2 x daily - 7 x weekly - 1 sets - 3 reps - 30 sec  hold - Supine Heel Slide with Strap  - 2 x daily - 7 x weekly - 1 sets - 10 reps - 10 sec  hold - Standing Anterior Posterior Weight Shift with Chair  - 2 x daily - 7 x weekly - 1-2 sets - 1-020 reps - 5-10 sec  hold - Standing Weight Shift Side to Side  - 1 x daily - 7 x weekly - 3 sets - 10 reps - Ankle Pumps in Elevation  - 2 x daily - 7 x weekly - 1 sets - 10-20 reps - Ankle Circles in Elevation  - 2 x daily - 7 x weekly - 1 sets - 10-15 reps - Ankle Alphabet in Elevation  - 2 x daily - 7 x weekly - 1 sets - 3 reps  ASSESSMENT:  CLINICAL IMPRESSION: Continued strengthening and mobility exercises. Progressed weight bearing as tolerated with dynamci weight shifting.   OBJECTIVE IMPAIRMENTS: Abnormal gait, decreased activity tolerance, decreased balance, decreased mobility, difficulty walking, decreased ROM, decreased strength, increased edema, impaired flexibility, improper body mechanics, postural dysfunction, and pain.   ACTIVITY LIMITATIONS: carrying, lifting, bending, sitting, standing,  squatting, sleeping, stairs, transfers, bed mobility, bathing, toileting, dressing, and locomotion level  PARTICIPATION LIMITATIONS: meal prep, cleaning, laundry, driving, shopping, community activity, and yard work  PERSONAL FACTORS: Past/current experiences are also affecting patient's functional outcome.   REHAB POTENTIAL: Good  CLINICAL DECISION MAKING: Stable/uncomplicated  EVALUATION COMPLEXITY: Low   GOALS: Goals reviewed with patient? Yes  SHORT TERM GOALS:  Target date: 06/09/2023  Independent in initial HEP Baseline: Goal status: INITIAL  2.  Increase knee ROM to 0 degrees extension and 110 degrees flexion  Baseline:  Goal status: INITIAL   LONG TERM GOALS: Target date: 07/21/2023  Independent in gait without assistive device with good gait pattern Baseline:  Goal status: INITIAL  2.  AROM Rt knee 0 degrees extension to 125 deg flexion  Baseline:  Goal status: INITIAL  3.  5/5 strength Rt LE  Baseline:  Goal status: INITIAL  4.  Patient reports return to normal functional activities and schedule at home  Baseline:  Goal status: INITIAL  5.  Independent in HEP including aquatic program as indicated  Baseline:  Goal status: INITIAL   PLAN:  PT FREQUENCY: 1-2x/week  PT DURATION: 12 weeks  PLANNED INTERVENTIONS: Therapeutic exercises, Therapeutic activity, Neuromuscular re-education, Balance training, Gait training, Patient/Family education, Self Care, Joint mobilization, Stair training, Aquatic Therapy, Dry Needling, Electrical stimulation, Cryotherapy, Moist heat, Taping, Vasopneumatic device, Ultrasound, Ionotophoresis 4mg /ml Dexamethasone, Manual therapy, and Re-evaluation  PLAN FOR NEXT SESSION: review and progress with exercises; gait training including stairs; manual work, DN, modalities as indicated    Sanjuana Mae, PTA 05/08/2023, 2:00 PM

## 2023-05-12 ENCOUNTER — Other Ambulatory Visit: Payer: Self-pay | Admitting: Family Medicine

## 2023-05-13 ENCOUNTER — Ambulatory Visit: Payer: Medicare HMO

## 2023-05-13 DIAGNOSIS — M25561 Pain in right knee: Secondary | ICD-10-CM | POA: Diagnosis not present

## 2023-05-13 DIAGNOSIS — M2560 Stiffness of unspecified joint, not elsewhere classified: Secondary | ICD-10-CM

## 2023-05-13 DIAGNOSIS — M6281 Muscle weakness (generalized): Secondary | ICD-10-CM

## 2023-05-13 DIAGNOSIS — R600 Localized edema: Secondary | ICD-10-CM

## 2023-05-13 DIAGNOSIS — R2689 Other abnormalities of gait and mobility: Secondary | ICD-10-CM | POA: Diagnosis not present

## 2023-05-13 NOTE — Therapy (Signed)
OUTPATIENT PHYSICAL THERAPY LOWER EXTREMITY TREATMENT   Patient Name: Tyler Gates MRN: [id number] DOB:1956/05/07, 67 y.o., male Today's Date: 05/13/2023  END OF SESSION:  PT End of Session - 05/13/23 0847     Visit Number 4    Number of Visits 12    Date for PT Re-Evaluation 07/21/23    Authorization Type 12 VISITS APPROVED FOR PT 04/28/2023-07/21/2023    Authorization - Visit Number 4    Authorization - Number of Visits 12    PT Start Time 0847    PT Stop Time 0940    PT Time Calculation (min) 53 min    Activity Tolerance Patient tolerated treatment well    Behavior During Therapy WFL for tasks assessed/performed             Past Medical History:  Diagnosis Date   Arthritis    Crohn's disease (HCC)    Depression    Dyspnea on exertion    GERD (gastroesophageal reflux disease)    Herpes    History of kidney stones    Hyperlipidemia    Hypertension    Instability of knee joint    Left displaced femoral neck fracture (HCC) 07/11/2017   Snoring    Past Surgical History:  Procedure Laterality Date   COLONOSCOPY  2007   Dr. Ozzie Hoyle normal TI, ascending colon/cecum with mild inflammation noted but biopses without signs of active inflammation, hyperpastic polyp, possible ischemia on path of left colon biopsy   COLONOSCOPY N/A 11/08/2015   Procedure: COLONOSCOPY;  Surgeon: Corbin Ade, MD;  Location: [facility];  Service: Endoscopy;  Laterality: N/A;   ESOPHAGOGASTRODUODENOSCOPY  2007   Dr. Ozzie Hoyle reflux esophagitis, hiatal hernia, negative H.pylori   ESOPHAGOGASTRODUODENOSCOPY N/A 11/08/2015   Procedure: ESOPHAGOGASTRODUODENOSCOPY (EGD);  Surgeon: Corbin Ade, MD;  Location: [facility];  Service: Endoscopy;  Laterality: N/A;  0830 - moved to 8:15 - office to notify   HIP PINNING,CANNULATED Left 07/12/2017   Procedure: CANNULATED HIP PINNING LEFT;  Surgeon: Samson Frederic, MD;  Location: [facility];  Service: Orthopedics;  Laterality: Left;   KNEE ARTHROPLASTY  Right 04/23/2023   Procedure: COMPUTER ASSISTED TOTAL KNEE ARTHROPLASTY;  Surgeon: Samson Frederic, MD;  Location: [facility];  Service: Orthopedics;  Laterality: Right;  160   Patient Active Problem List   Diagnosis Date Noted   Osteoarthritis of right knee 04/23/2023   Preoperative clearance 02/11/2023   Chronic sinusitis 03/12/2022   Visual disturbance 03/12/2022   Acute sinusitis 01/29/2022   Chronic kidney disease (CKD) stage G2/A2, mildly decreased glomerular filtration rate (GFR) between 60-89 mL/min/1.73 square meter and albuminuria creatinine ratio between 30-299 mg/g 10/22/2018   Hypogonadism male 10/21/2018   Screening PSA (prostate specific antigen) 10/21/2018   Chronic kidney disease due to benign hypertension 10/21/2018   Depression, major, recurrent, in remission (HCC) 10/21/2018   Statin intolerance 09/14/2018   Agatston coronary artery calcium score greater than 400 08/10/2018   Family history of coronary artery disease 08/10/2018   Crohn's disease of colon without complication (HCC) 05/09/2017   Mucosal abnormality of esophagus    Hiatal hernia    KNEE JOINT INSTABILITY 03/07/2007   Hyperlipidemia 12/30/2006   Essential hypertension 12/30/2006   GERD 12/30/2006    PCP: Dr Everrett Coombe  REFERRING PROVIDER: Dr Samson Frederic   REFERRING DIAG: Rt TKA  THERAPY DIAG:  Acute pain of right knee  Stiffness in joint  Other abnormalities of gait and mobility  Localized edema  Muscle weakness (generalized)  Rationale for Evaluation and Treatment: Rehabilitation  ONSET DATE: 04/23/23  SUBJECTIVE:   SUBJECTIVE STATEMENT: Patient reports minimal pain in knee, 2/10. Patient states his R hip was really bothering him over the weekend, states he finally got relief by sitting in lounger at home.   PERTINENT HISTORY: HTN; Crones disease; fx of Lt femoral neck with ORIF ~ 4 yrs ago PAIN:  Are you having pain? Yes: NPRS scale: 7-8/10; am 3/10  Pain location: Rt knee pain   Pain description: constant pressure  Aggravating factors: swelling; walking   Relieving factors: meds; ice; rest   PRECAUTIONS: Knee  WEIGHT BEARING RESTRICTIONS: No  FALLS:  Has patient fallen in last 6 months? 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; avid cyclist ~ 2-3 times per week ~ 40 min; yard work; some household chores; race slot cars   PLOF: Independent  PATIENT GOALS: return all normal activities  NEXT MD VISIT: June 2024  OBJECTIVE:   DIAGNOSTIC FINDINGS: 04/23/23 xray of Rt knee - Acute surgical changes of total knee arthroplasty   PATIENT SURVEYS:  Not completed   COGNITION: Overall cognitive status: Within functional limits for tasks assessed     SENSATION: WFL  EDEMA:  Moderate Rt knee  Rt knee at joint line 42 cm; left 38.5 cm  MUSCLE LENGTH: Hamstrings: Right 65 deg; Left 70 deg Hip flexors; ITB tight bilat Rt > Lt   POSTURE: rounded shoulders, forward head, flexed trunk , and weight shift left  PALPATION:   LOWER EXTREMITY ROM:  Active ROM Right eval Left eval Right 05/08/23  Hip flexion     Hip extension     Hip abduction     Hip adduction     Hip internal rotation     Hip external rotation     Knee flexion 90  113  Knee extension -12  -4  Ankle dorsiflexion     Ankle plantarflexion     Ankle inversion     Ankle eversion      (Blank rows = not tested)  LOWER EXTREMITY MMT: strength Lt LE - WNL's   MMT Right eval Left eval  Hip flexion 4+   Hip extension 4   Hip abduction 4   Hip adduction    Hip internal rotation    Hip external rotation    Knee flexion NT   Knee extension NT   Ankle dorsiflexion    Ankle plantarflexion    Ankle inversion    Ankle eversion     (Blank  rows = not tested)  FUNCTIONAL TESTS:  5 times sit to stand: to be assessed   GAIT: Distance walked: 40 Assistive device utilized: Environmental consultant - 2 wheeled Level of assistance: Complete Independence Comments: limp Rt LE with wt bearing Rt LE   [facility] Adult PT Treatment:                                                DATE: 05/13/2023 Therapeutic Exercise: Recumbent bike L3 x5 min Mat Table: AAROM heel slides w/strap 5x10" --> AROM heel slides --> resisted heel slides YTB x5 HS/ITB w/strap S/L hip  ext leg raises x12 Myofascial release piriformis/glute (small green ball --> tennis ball) Gastroc/soleus stretches on slant board x 30" each Standing: Hip hiking Hip extension Therapeutic Activity: A/P & lateral weight shifting R foot on 6" step: L toe tap on top step/slow step down 4" step lateral foot taps Gait training no AD --> focus on swing phase Modalities: Vaso 34 degrees, med compression x 10 min    [facility] Adult PT Treatment:                                                DATE: 05/08/2023 Therapeutic Exercise: Recumbent bike L1 x 5 min Mat Table: HS/ITB w/strap AAROM heel slides w/strap 10x5" Myofascial release piriformis with tennis ball sitting --> supine Quad set x10 SAQ green bolster 10x5" Standing: Gastroc/soleus stretch Heel raises Gastroc stretch on slant board    [facility] Adult PT Treatment:                                                DATE: 05/01/2023 Therapeutic Exercise: Recumbent bike partial revolutions x Mat table: HS/ITB/ADD stretches 2x30" each AAROM heel slides w/strap 10x10" Quad set 8x5" SAQ (green bolster) 8x5" Therapeutic Activity: Fwd walking FWW - focus on dynamic knee flexion/heel strike --> bkwd stepping Gait training with SPC Toe taps on 4" step --> forward weight shifting  Modalities: Vaso 34 degrees, med compression x 10 min   PATIENT EDUCATION:  Education details: HEP; POC  Person educated: Patient Education method: Explanation,  Demonstration, Tactile cues, Verbal cues, and Handouts Education comprehension: verbalized understanding, returned demonstration, verbal cues required, tactile cues required, and needs further education  HOME EXERCISE PROGRAM: Access Code: [email or web address] URL: [email or web address] Date: 05/08/2023 Prepared by: Carlynn Herald  Exercises - Supine Quad Set  - 2 x daily - 7 x weekly - 1-2 sets - 10 reps - 3 sec  hold - Hooklying Hamstring Stretch with Strap  - 2 x daily - 7 x weekly - 1 sets - 3 reps - 30 sec  hold - Supine ITB Stretch with Strap  - 2 x daily - 7 x weekly - 1 sets - 3 reps - 30 sec  hold - Supine Heel Slide with Strap  - 2 x daily - 7 x weekly - 1 sets - 10 reps - 10 sec  hold - Standing Anterior Posterior Weight Shift with Chair  - 2 x daily - 7 x weekly - 1-2 sets - 1-020 reps - 5-10 sec  hold - Standing Weight Shift Side to Side  - 1 x daily - 7 x weekly - 3 sets - 10 reps - Ankle Pumps in Elevation  - 2 x daily - 7 x weekly - 1 sets - 10-20 reps - Ankle Circles in Elevation  - 2 x daily - 7 x weekly - 1 sets - 10-15 reps - Ankle Alphabet in Elevation  - 2 x daily - 7 x weekly - 1 sets - 3 reps  ASSESSMENT:  CLINICAL IMPRESSION: Mild deficit noted during gait training with slight delay on knee flexion during swing phase on R LE; gait pattern improved with cueing for more awareness of functional knee flexion. Hip and knee strengthening progressed, focusing more on  functional standing exercises.   OBJECTIVE IMPAIRMENTS: Abnormal gait, decreased activity tolerance, decreased balance, decreased mobility, difficulty walking, decreased ROM, decreased strength, increased edema, impaired flexibility, improper body mechanics, postural dysfunction, and pain.   ACTIVITY LIMITATIONS: carrying, lifting, bending, sitting, standing, squatting, sleeping, stairs, transfers, bed mobility, bathing, toileting, dressing, and locomotion level  PARTICIPATION LIMITATIONS: meal prep,  cleaning, laundry, driving, shopping, community activity, and yard work  PERSONAL FACTORS: Past/current experiences are also affecting patient's functional outcome.   REHAB POTENTIAL: Good  CLINICAL DECISION MAKING: Stable/uncomplicated  EVALUATION COMPLEXITY: Low   GOALS: Goals reviewed with patient? Yes  SHORT TERM GOALS: Target date: 06/09/2023  Independent in initial HEP Baseline: Goal status: INITIAL  2.  Increase knee ROM to 0 degrees extension and 110 degrees flexion  Baseline:  Goal status: INITIAL   LONG TERM GOALS: Target date: 07/21/2023  Independent in gait without assistive device with good gait pattern Baseline:  Goal status: INITIAL  2.  AROM Rt knee 0 degrees extension to 125 deg flexion  Baseline:  Goal status: INITIAL  3.  5/5 strength Rt LE  Baseline:  Goal status: INITIAL  4.  Patient reports return to normal functional activities and schedule at home  Baseline:  Goal status: INITIAL  5.  Independent in HEP including aquatic program as indicated  Baseline:  Goal status: INITIAL   PLAN:  PT FREQUENCY: 1-2x/week  PT DURATION: 12 weeks  PLANNED INTERVENTIONS: Therapeutic exercises, Therapeutic activity, Neuromuscular re-education, Balance training, Gait training, Patient/Family education, Self Care, Joint mobilization, Stair training, Aquatic Therapy, Dry Needling, Electrical stimulation, Cryotherapy, Moist heat, Taping, Vasopneumatic device, Ultrasound, Ionotophoresis 4mg /ml Dexamethasone, Manual therapy, and Re-evaluation  PLAN FOR NEXT SESSION: Update HEP. Progress with exercises; gait training including stairs; manual work, DN, modalities as indicated    Sanjuana Mae, PTA 05/13/2023, 9:33 AM

## 2023-05-15 ENCOUNTER — Ambulatory Visit: Payer: Medicare HMO

## 2023-05-15 DIAGNOSIS — R2689 Other abnormalities of gait and mobility: Secondary | ICD-10-CM | POA: Diagnosis not present

## 2023-05-15 DIAGNOSIS — M2560 Stiffness of unspecified joint, not elsewhere classified: Secondary | ICD-10-CM | POA: Diagnosis not present

## 2023-05-15 DIAGNOSIS — M6281 Muscle weakness (generalized): Secondary | ICD-10-CM

## 2023-05-15 DIAGNOSIS — R600 Localized edema: Secondary | ICD-10-CM

## 2023-05-15 DIAGNOSIS — M25561 Pain in right knee: Secondary | ICD-10-CM

## 2023-05-15 NOTE — Therapy (Signed)
OUTPATIENT PHYSICAL THERAPY LOWER EXTREMITY TREATMENT   Patient Name: Tyler Gates MRN: [id number] DOB:1956-11-13, 67 y.o., male Today's Date: 05/15/2023  END OF SESSION:  PT End of Session - 05/15/23 1021     Visit Number 5    Number of Visits 12    Date for PT Re-Evaluation 07/21/23    Authorization Type 12 VISITS APPROVED FOR PT 04/28/2023-07/21/2023    Authorization - Visit Number 5    Authorization - Number of Visits 12    PT Start Time 1015    PT Stop Time 1103    PT Time Calculation (min) 48 min    Activity Tolerance Patient tolerated treatment well    Behavior During Therapy WFL for tasks assessed/performed             Past Medical History:  Diagnosis Date   Arthritis    Crohn's disease (HCC)    Depression    Dyspnea on exertion    GERD (gastroesophageal reflux disease)    Herpes    History of kidney stones    Hyperlipidemia    Hypertension    Instability of knee joint    Left displaced femoral neck fracture (HCC) 07/11/2017   Snoring    Past Surgical History:  Procedure Laterality Date   COLONOSCOPY  2007   Dr. Ozzie Hoyle normal TI, ascending colon/cecum with mild inflammation noted but biopses without signs of active inflammation, hyperpastic polyp, possible ischemia on path of left colon biopsy   COLONOSCOPY N/A 11/08/2015   Procedure: COLONOSCOPY;  Surgeon: Corbin Ade, MD;  Location: [facility];  Service: Endoscopy;  Laterality: N/A;   ESOPHAGOGASTRODUODENOSCOPY  2007   Dr. Ozzie Hoyle reflux esophagitis, hiatal hernia, negative H.pylori   ESOPHAGOGASTRODUODENOSCOPY N/A 11/08/2015   Procedure: ESOPHAGOGASTRODUODENOSCOPY (EGD);  Surgeon: Corbin Ade, MD;  Location: [facility];  Service: Endoscopy;  Laterality: N/A;  0830 - moved to 8:15 - office to notify   HIP PINNING,CANNULATED Left 07/12/2017   Procedure: CANNULATED HIP PINNING LEFT;  Surgeon: Samson Frederic, MD;  Location: [facility];  Service: Orthopedics;  Laterality: Left;   KNEE ARTHROPLASTY  Right 04/23/2023   Procedure: COMPUTER ASSISTED TOTAL KNEE ARTHROPLASTY;  Surgeon: Samson Frederic, MD;  Location: [facility];  Service: Orthopedics;  Laterality: Right;  160   Patient Active Problem List   Diagnosis Date Noted   Osteoarthritis of right knee 04/23/2023   Preoperative clearance 02/11/2023   Chronic sinusitis 03/12/2022   Visual disturbance 03/12/2022   Acute sinusitis 01/29/2022   Chronic kidney disease (CKD) stage G2/A2, mildly decreased glomerular filtration rate (GFR) between 60-89 mL/min/1.73 square meter and albuminuria creatinine ratio between 30-299 mg/g 10/22/2018   Hypogonadism male 10/21/2018   Screening PSA (prostate specific antigen) 10/21/2018   Chronic kidney disease due to benign hypertension 10/21/2018   Depression, major, recurrent, in remission (HCC) 10/21/2018   Statin intolerance 09/14/2018   Agatston coronary artery calcium score greater than 400 08/10/2018   Family history of coronary artery disease 08/10/2018   Crohn's disease of colon without complication (HCC) 05/09/2017   Mucosal abnormality of esophagus    Hiatal hernia    KNEE JOINT INSTABILITY 03/07/2007   Hyperlipidemia 12/30/2006   Essential hypertension 12/30/2006   GERD 12/30/2006    PCP: Dr Everrett Coombe  REFERRING PROVIDER: Dr Samson Frederic   REFERRING DIAG: Rt TKA  THERAPY DIAG:  Acute pain of right knee  Stiffness in joint  Other abnormalities of gait and mobility  Localized edema  Muscle weakness (generalized)  Rationale for Evaluation and Treatment: Rehabilitation  ONSET DATE: 04/23/23  SUBJECTIVE:   SUBJECTIVE STATEMENT: Patient reports he overdid it yesterday and is feeling sore in R hip and ITB; reports minimal pain in knee today, 2/10 at most.   PERTINENT HISTORY: HTN; Crones disease; fx of Lt femoral neck with ORIF ~ 4 yrs ago PAIN:  Are you having pain? Yes: NPRS scale: 7-8/10; am 3/10  Pain location: Rt knee pain  Pain description: constant pressure   Aggravating factors: swelling; walking   Relieving factors: meds; ice; rest   PRECAUTIONS: Knee  WEIGHT BEARING RESTRICTIONS: No  FALLS:  Has patient fallen in last 6 months? 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; avid cyclist ~ 2-3 times per week ~ 40 min; yard work; some household chores; race slot cars   PLOF: Independent  PATIENT GOALS: return all normal activities  NEXT MD VISIT: June 2024  OBJECTIVE:   DIAGNOSTIC FINDINGS: 04/23/23 xray of Rt knee - Acute surgical changes of total knee arthroplasty   PATIENT SURVEYS:  Not completed   COGNITION: Overall cognitive status: Within functional limits for tasks assessed     SENSATION: WFL  EDEMA:  Moderate Rt knee  Rt knee at joint line 42 cm; left 38.5 cm  MUSCLE LENGTH: Hamstrings: Right 65 deg; Left 70 deg Hip flexors; ITB tight bilat Rt > Lt   POSTURE: rounded shoulders, forward head, flexed trunk , and weight shift left  PALPATION:   LOWER EXTREMITY ROM:  Active ROM Right eval Left eval Right 05/08/23  Hip flexion     Hip extension     Hip abduction     Hip adduction     Hip internal rotation     Hip external rotation     Knee flexion 90  113  Knee extension -12  -4  Ankle dorsiflexion     Ankle plantarflexion     Ankle inversion     Ankle eversion      (Blank rows = not tested)  LOWER EXTREMITY MMT: strength Lt LE - WNL's   MMT Right eval Left eval  Hip flexion 4+   Hip extension 4   Hip abduction 4   Hip adduction    Hip internal rotation    Hip external rotation    Knee flexion NT   Knee extension NT   Ankle dorsiflexion    Ankle plantarflexion    Ankle inversion    Ankle eversion     (Blank rows = not tested)  FUNCTIONAL TESTS:   5 times sit to stand: to be assessed   GAIT: Distance walked: 40 Assistive device utilized: Environmental consultant - 2 wheeled Level of assistance: Complete Independence Comments: limp Rt LE with wt bearing Rt LE   [facility] Adult PT Treatment:                                                DATE: 05/15/2023 Therapeutic Exercise: Recumbent bike L3 x 5 min Roller along ITB, posterior hip complex Quad set 10x5" Supine AROM heel slides 10x5" S/L hip abd in extension 2x10 Supine dynamic HS stretch --> bent  knee glute stretch Hip hiking (R SLS) from 4" step  TKE 5# pulley x10 --> GTB x 5 Seated knee extension & flexion RTB x10 each Modalities: Vaso 34 degrees, med compression x 10 min    [facility] Adult PT Treatment:                                                DATE: 05/13/2023 Therapeutic Exercise: Recumbent bike L3 x5 min Mat Table: AAROM heel slides w/strap 5x10" --> AROM heel slides --> resisted heel slides YTB x5 HS/ITB w/strap S/L hip ext leg raises x12 Myofascial release piriformis/glute (small green ball --> tennis ball) Gastroc/soleus stretches on slant board x 30" each Standing: Hip hiking Hip extension Therapeutic Activity: A/P & lateral weight shifting R foot on 6" step: L toe tap on top step/slow step down 4" step lateral foot taps Gait training no AD --> focus on swing phase Modalities: Vaso 34 degrees, med compression x 10 min    [facility] Adult PT Treatment:                                                DATE: 05/08/2023 Therapeutic Exercise: Recumbent bike L1 x 5 min Mat Table: HS/ITB w/strap AAROM heel slides w/strap 10x5" Myofascial release piriformis with tennis ball sitting --> supine Quad set x10 SAQ green bolster 10x5" Standing: Gastroc/soleus stretch Heel raises Gastroc stretch on slant board   PATIENT EDUCATION:  Education details: Updated HEP  Person educated: Patient Education method: Programmer, multimedia, Facilities manager, Actor cues, Verbal cues, and  Handouts Education comprehension: verbalized understanding, returned demonstration, verbal cues required, tactile cues required, and needs further education  HOME EXERCISE PROGRAM: Access Code: [email or web address] URL: [email or web address] Date: 05/15/2023 Prepared by: Carlynn Herald  Exercises - Long Sitting Quad Set with Towel Roll Under Heel  - 1 x daily - 7 x weekly - 2 sets - 10 reps - 3 sec hold - Supine Short Arc Quad  - 1 x daily - 7 x weekly - 2 sets - 10 reps - Supine Heel Slide with Strap  - 1 x daily - 7 x weekly - 2 sets - 10 reps - Supine Butterfly Groin Stretch  - 1 x daily - 7 x weekly - 2 sets - 30 sec hold - Standing Gastroc Stretch on Step  - 1 x daily - 7 x weekly - 2 sets - 30 sec hold - Standing Soleus Stretch on Step  - 1 x daily - 7 x weekly - 2 sets - 30 sec hold - Straight Leg Raise with External Rotation  - 1 x daily - 7 x weekly - 2 sets - 30 sec hold - Side Stepping with Resistance at Ankles  - 1 x daily - 7 x weekly - 2 sets - 10 reps - Forward and Backward Monster Walk with Resistance at Ankles and Counter Support  - 1 x daily - 7 x weekly - 2 sets - 10 reps - Prone Hamstring Curl with Anchored Resistance  - 1 x daily - 7 x weekly - 3 sets - 10 reps - Seated Hamstring Curl with Anchored Resistance  - 1 x daily - 7 x weekly - 3 sets - 10 reps - Seated Knee  Extension with Resistance  - 1 x daily - 7 x weekly - 3 sets - 10 reps - Standing Terminal Knee Extension with Resistance  - 1 x daily - 7 x weekly - 3 sets - 10 reps - Standing Hamstring Curl with Resistance  - 1 x daily - 7 x weekly - 3 sets - 10 reps - Side Stepping with Resistance at Ankles and Counter Support  - 1 x daily - 7 x weekly - 3 sets - 10 reps  ASSESSMENT:  CLINICAL IMPRESSION: Quad and hamstring strengthening progressed with added resistance in sitting and standing. Noted improvement in pelvis stability during ambulation, as well as good functional knee flexion.   OBJECTIVE  IMPAIRMENTS: Abnormal gait, decreased activity tolerance, decreased balance, decreased mobility, difficulty walking, decreased ROM, decreased strength, increased edema, impaired flexibility, improper body mechanics, postural dysfunction, and pain.   ACTIVITY LIMITATIONS: carrying, lifting, bending, sitting, standing, squatting, sleeping, stairs, transfers, bed mobility, bathing, toileting, dressing, and locomotion level  PARTICIPATION LIMITATIONS: meal prep, cleaning, laundry, driving, shopping, community activity, and yard work  PERSONAL FACTORS: Past/current experiences are also affecting patient's functional outcome.   REHAB POTENTIAL: Good  CLINICAL DECISION MAKING: Stable/uncomplicated  EVALUATION COMPLEXITY: Low   GOALS: Goals reviewed with patient? Yes  SHORT TERM GOALS: Target date: 06/09/2023  Independent in initial HEP Baseline: Goal status: INITIAL  2.  Increase knee ROM to 0 degrees extension and 110 degrees flexion  Baseline:  Goal status: INITIAL   LONG TERM GOALS: Target date: 07/21/2023  Independent in gait without assistive device with good gait pattern Baseline:  Goal status: INITIAL  2.  AROM Rt knee 0 degrees extension to 125 deg flexion  Baseline:  Goal status: INITIAL  3.  5/5 strength Rt LE  Baseline:  Goal status: INITIAL  4.  Patient reports return to normal functional activities and schedule at home  Baseline:  Goal status: INITIAL  5.  Independent in HEP including aquatic program as indicated  Baseline:  Goal status: INITIAL   PLAN:  PT FREQUENCY: 1-2x/week  PT DURATION: 12 weeks  PLANNED INTERVENTIONS: Therapeutic exercises, Therapeutic activity, Neuromuscular re-education, Balance training, Gait training, Patient/Family education, Self Care, Joint mobilization, Stair training, Aquatic Therapy, Dry Needling, Electrical stimulation, Cryotherapy, Moist heat, Taping, Vasopneumatic device, Ultrasound, Ionotophoresis 4mg /ml Dexamethasone,  Manual therapy, and Re-evaluation  PLAN FOR NEXT SESSION: Progress with exercises; gait training including stairs; manual work, DN, modalities as indicated    Sanjuana Mae, PTA 05/15/2023, 10:58 AM

## 2023-05-20 ENCOUNTER — Ambulatory Visit: Payer: Medicare HMO

## 2023-05-21 ENCOUNTER — Ambulatory Visit: Payer: Medicare HMO

## 2023-05-21 DIAGNOSIS — R2689 Other abnormalities of gait and mobility: Secondary | ICD-10-CM | POA: Diagnosis not present

## 2023-05-21 DIAGNOSIS — M6281 Muscle weakness (generalized): Secondary | ICD-10-CM | POA: Diagnosis not present

## 2023-05-21 DIAGNOSIS — R600 Localized edema: Secondary | ICD-10-CM

## 2023-05-21 DIAGNOSIS — M25561 Pain in right knee: Secondary | ICD-10-CM | POA: Diagnosis not present

## 2023-05-21 DIAGNOSIS — M2560 Stiffness of unspecified joint, not elsewhere classified: Secondary | ICD-10-CM

## 2023-05-21 NOTE — Therapy (Signed)
OUTPATIENT PHYSICAL THERAPY LOWER EXTREMITY TREATMENT   Patient Name: Tyler Gates MRN: [id number] DOB:1956-07-12, 67 y.o., male Today's Date: 05/21/2023  END OF SESSION:  PT End of Session - 05/21/23 1445     Visit Number 6    Number of Visits 12    Date for PT Re-Evaluation 07/21/23    Authorization Type [employer] - 12 VISITS APPROVED FOR PT 04/28/2023-07/21/2023    Authorization - Visit Number 6    Authorization - Number of Visits 12    PT Start Time 1448    PT Stop Time 1538    PT Time Calculation (min) 50 min    Activity Tolerance Patient tolerated treatment well    Behavior During Therapy WFL for tasks assessed/performed             Past Medical History:  Diagnosis Date   Arthritis    Crohn's disease (HCC)    Depression    Dyspnea on exertion    GERD (gastroesophageal reflux disease)    Herpes    History of kidney stones    Hyperlipidemia    Hypertension    Instability of knee joint    Left displaced femoral neck fracture (HCC) 07/11/2017   Snoring    Past Surgical History:  Procedure Laterality Date   COLONOSCOPY  2007   Dr. Ozzie Hoyle normal TI, ascending colon/cecum with mild inflammation noted but biopses without signs of active inflammation, hyperpastic polyp, possible ischemia on path of left colon biopsy   COLONOSCOPY N/A 11/08/2015   Procedure: COLONOSCOPY;  Surgeon: Corbin Ade, MD;  Location: [facility];  Service: Endoscopy;  Laterality: N/A;   ESOPHAGOGASTRODUODENOSCOPY  2007   Dr. Ozzie Hoyle reflux esophagitis, hiatal hernia, negative H.pylori   ESOPHAGOGASTRODUODENOSCOPY N/A 11/08/2015   Procedure: ESOPHAGOGASTRODUODENOSCOPY (EGD);  Surgeon: Corbin Ade, MD;  Location: [facility];  Service: Endoscopy;  Laterality: N/A;  0830 - moved to 8:15 - office to notify   HIP PINNING,CANNULATED Left 07/12/2017   Procedure: CANNULATED HIP PINNING LEFT;  Surgeon: Samson Frederic, MD;  Location: [facility];  Service: Orthopedics;  Laterality: Left;   KNEE  ARTHROPLASTY Right 04/23/2023   Procedure: COMPUTER ASSISTED TOTAL KNEE ARTHROPLASTY;  Surgeon: Samson Frederic, MD;  Location: [facility];  Service: Orthopedics;  Laterality: Right;  160   Patient Active Problem List   Diagnosis Date Noted   Osteoarthritis of right knee 04/23/2023   Preoperative clearance 02/11/2023   Chronic sinusitis 03/12/2022   Visual disturbance 03/12/2022   Acute sinusitis 01/29/2022   Chronic kidney disease (CKD) stage G2/A2, mildly decreased glomerular filtration rate (GFR) between 60-89 mL/min/1.73 square meter and albuminuria creatinine ratio between 30-299 mg/g 10/22/2018   Hypogonadism male 10/21/2018   Screening PSA (prostate specific antigen) 10/21/2018   Chronic kidney disease due to benign hypertension 10/21/2018   Depression, major, recurrent, in remission (HCC) 10/21/2018   Statin intolerance 09/14/2018   Agatston coronary artery calcium score greater than 400 08/10/2018   Family history of coronary artery disease 08/10/2018   Crohn's disease of colon without complication (HCC) 05/09/2017   Mucosal abnormality of esophagus    Hiatal hernia    KNEE JOINT INSTABILITY 03/07/2007   Hyperlipidemia 12/30/2006   Essential hypertension 12/30/2006   GERD 12/30/2006    PCP: Dr Everrett Coombe  REFERRING PROVIDER: Dr Samson Frederic   REFERRING DIAG: Rt TKA  THERAPY DIAG:  Acute pain of right knee  Stiffness in joint  Muscle weakness (generalized)  Localized edema  Rationale for Evaluation and  Treatment: Rehabilitation  ONSET DATE: 04/23/23  SUBJECTIVE:   SUBJECTIVE STATEMENT: Patient reports he is walking outside, states he is averaging 3,000 steps a day. Patient states he has minimal to no pain in knee. Patient states he was able to cross legs in figure 4 to donn socks with no problem.  PERTINENT HISTORY: HTN; Crones disease; fx of Lt femoral neck with ORIF ~ 4 yrs ago PAIN:  Are you having pain? Yes: NPRS scale: 7-8/10; am 3/10  Pain location:  Rt knee pain  Pain description: constant pressure  Aggravating factors: swelling; walking   Relieving factors: meds; ice; rest   PRECAUTIONS: Knee  WEIGHT BEARING RESTRICTIONS: No  FALLS:  Has patient fallen in last 6 months? 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; avid cyclist ~ 2-3 times per week ~ 40 min; yard work; some household chores; race slot cars   PLOF: Independent  PATIENT GOALS: return all normal activities  NEXT MD VISIT: 2nd week in June (6 wk follow up)  OBJECTIVE:   DIAGNOSTIC FINDINGS: 04/23/23 xray of Rt knee - Acute surgical changes of total knee arthroplasty   PATIENT SURVEYS:  Not completed   COGNITION: Overall cognitive status: Within functional limits for tasks assessed     SENSATION: WFL  EDEMA:  Moderate Rt knee  Rt knee at joint line 42 cm; left 38.5 cm  MUSCLE LENGTH: Hamstrings: Right 65 deg; Left 70 deg Hip flexors; ITB tight bilat Rt > Lt   POSTURE: rounded shoulders, forward head, flexed trunk , and weight shift left  PALPATION:   LOWER EXTREMITY ROM:  Active ROM Right eval Left eval Right 05/08/23 Right 05/21/23  Hip flexion      Hip extension      Hip abduction      Hip adduction      Hip internal rotation      Hip external rotation      Knee flexion 90  113 125  Knee extension -12  -4 -2  Ankle dorsiflexion      Ankle plantarflexion      Ankle inversion      Ankle eversion       (Blank rows = not tested)  LOWER EXTREMITY MMT: strength Lt LE - WNL's   MMT Right eval Left eval  Hip flexion 4+   Hip extension 4   Hip abduction 4   Hip adduction    Hip internal rotation    Hip external rotation    Knee flexion NT   Knee extension NT   Ankle dorsiflexion    Ankle  plantarflexion    Ankle inversion    Ankle eversion     (Blank rows = not tested)  FUNCTIONAL TESTS:  5 times sit to stand: to be assessed   GAIT: Distance walked: 40 Assistive device utilized: Walker - 2 wheeled Level of assistance: Complete Independence Comments: limp Rt LE with wt bearing Rt LE   [facility] Adult PT Treatment:                                                DATE: 05/21/2023 Therapeutic Exercise:  Recumbent bike L3 x 5 min --> moving seat closer Knee measurements (see above) 4" step front & lateral foot tap downs x10 each HS stretch on top step 5x10" R SLB no HHA x 7 sec --> fingertip touch x20" Gastroc/soleus stretches on slantboard 2x20" mREO/EC standing on airex Stable ground tandem balance (each leg forward) Standing quad stretch w/strap Modalities: Vaso 34 degrees, med compression x 10 min     [facility] Adult PT Treatment:                                                DATE: 05/15/2023 Therapeutic Exercise: Recumbent bike L3 x 5 min Roller along ITB, posterior hip complex Quad set 10x5" Supine AROM heel slides 10x5" S/L hip abd in extension 2x10 Supine dynamic HS stretch --> bent knee glute stretch Hip hiking (R SLS) from 4" step  TKE 5# pulley x10 --> GTB x 5 Seated knee extension & flexion RTB x10 each Modalities: Vaso 34 degrees, med compression x 10 min    [facility] Adult PT Treatment:                                                DATE: 05/13/2023 Therapeutic Exercise: Recumbent bike L3 x5 min Mat Table: AAROM heel slides w/strap 5x10" --> AROM heel slides --> resisted heel slides YTB x5 HS/ITB w/strap S/L hip ext leg raises x12 Myofascial release piriformis/glute (small green ball --> tennis ball) Gastroc/soleus stretches on slant board x 30" each Standing: Hip hiking Hip extension Therapeutic Activity: A/P & lateral weight shifting R foot on 6" step: L toe tap on top step/slow step down 4" step lateral foot taps Gait training no AD --> focus  on swing phase Modalities: Vaso 34 degrees, med compression x 10 min    PATIENT EDUCATION:  Education details: Updated HEP  Person educated: Patient Education method: Explanation, Demonstration, Tactile cues, Verbal cues, and Handouts Education comprehension: verbalized understanding, returned demonstration, verbal cues required, tactile cues required, and needs further education  HOME EXERCISE PROGRAM: Access Code: [email or web address] URL: [email or web address] Date: 05/15/2023 Prepared by: Carlynn Herald  Exercises - Long Sitting Quad Set with Towel Roll Under Heel  - 1 x daily - 7 x weekly - 2 sets - 10 reps - 3 sec hold - Supine Short Arc Quad  - 1 x daily - 7 x weekly - 2 sets - 10 reps - Supine Heel Slide with Strap  - 1 x daily - 7 x weekly - 2 sets - 10 reps - Supine Butterfly Groin Stretch  - 1 x daily - 7 x weekly - 2 sets - 30 sec hold - Standing Gastroc Stretch on Step  - 1 x daily - 7 x weekly - 2 sets - 30 sec hold - Standing Soleus Stretch on Step  - 1 x daily - 7 x weekly - 2 sets - 30 sec hold - Straight Leg Raise with External Rotation  - 1 x daily - 7 x weekly - 2 sets - 30 sec hold - Side Stepping with Resistance at Ankles  - 1 x daily - 7 x weekly - 2 sets - 10 reps - Forward and Backward Monster Walk with Resistance at Ankles and Counter  Support  - 1 x daily - 7 x weekly - 2 sets - 10 reps - Prone Hamstring Curl with Anchored Resistance  - 1 x daily - 7 x weekly - 3 sets - 10 reps - Seated Hamstring Curl with Anchored Resistance  - 1 x daily - 7 x weekly - 3 sets - 10 reps - Seated Knee Extension with Resistance  - 1 x daily - 7 x weekly - 3 sets - 10 reps - Standing Terminal Knee Extension with Resistance  - 1 x daily - 7 x weekly - 3 sets - 10 reps - Standing Hamstring Curl with Resistance  - 1 x daily - 7 x weekly - 3 sets - 10 reps - Side Stepping with Resistance at Ankles and Counter Support  - 1 x daily - 7 x weekly - 3 sets - 10  reps  ASSESSMENT:  CLINICAL IMPRESSION: Knee stability challenged with balance on compliant surface and NBOS on even ground. Front and lateral step downs added to progress knee strength and stabilty with stair navigation.  OBJECTIVE IMPAIRMENTS: Abnormal gait, decreased activity tolerance, decreased balance, decreased mobility, difficulty walking, decreased ROM, decreased strength, increased edema, impaired flexibility, improper body mechanics, postural dysfunction, and pain.   ACTIVITY LIMITATIONS: carrying, lifting, bending, sitting, standing, squatting, sleeping, stairs, transfers, bed mobility, bathing, toileting, dressing, and locomotion level  PARTICIPATION LIMITATIONS: meal prep, cleaning, laundry, driving, shopping, community activity, and yard work  PERSONAL FACTORS: Past/current experiences are also affecting patient's functional outcome.   REHAB POTENTIAL: Good  CLINICAL DECISION MAKING: Stable/uncomplicated  EVALUATION COMPLEXITY: Low   GOALS: Goals reviewed with patient? Yes  SHORT TERM GOALS: Target date: 06/09/2023  Independent in initial HEP Baseline: Goal status: INITIAL  2.  Increase knee ROM to 0 degrees extension and 110 degrees flexion  Baseline:  Goal status: INITIAL   LONG TERM GOALS: Target date: 07/21/2023  Independent in gait without assistive device with good gait pattern Baseline:  Goal status: INITIAL  2.  AROM Rt knee 0 degrees extension to 125 deg flexion  Baseline:  Goal status: INITIAL  3.  5/5 strength Rt LE  Baseline:  Goal status: INITIAL  4.  Patient reports return to normal functional activities and schedule at home  Baseline:  Goal status: INITIAL  5.  Independent in HEP including aquatic program as indicated  Baseline:  Goal status: INITIAL   PLAN:  PT FREQUENCY: 1-2x/week  PT DURATION: 12 weeks  PLANNED INTERVENTIONS: Therapeutic exercises, Therapeutic activity, Neuromuscular re-education, Balance training, Gait  training, Patient/Family education, Self Care, Joint mobilization, Stair training, Aquatic Therapy, Dry Needling, Electrical stimulation, Cryotherapy, Moist heat, Taping, Vasopneumatic device, Ultrasound, Ionotophoresis 4mg /ml Dexamethasone, Manual therapy, and Re-evaluation  PLAN FOR NEXT SESSION: Progress with exercises; gait training including stairs; manual work, DN, modalities as indicated    Sanjuana Mae, PTA 05/21/2023, 3:32 PM

## 2023-05-23 ENCOUNTER — Ambulatory Visit: Payer: Medicare HMO

## 2023-05-23 DIAGNOSIS — M2560 Stiffness of unspecified joint, not elsewhere classified: Secondary | ICD-10-CM

## 2023-05-23 DIAGNOSIS — R2689 Other abnormalities of gait and mobility: Secondary | ICD-10-CM | POA: Diagnosis not present

## 2023-05-23 DIAGNOSIS — R600 Localized edema: Secondary | ICD-10-CM | POA: Diagnosis not present

## 2023-05-23 DIAGNOSIS — M6281 Muscle weakness (generalized): Secondary | ICD-10-CM | POA: Diagnosis not present

## 2023-05-23 DIAGNOSIS — M25561 Pain in right knee: Secondary | ICD-10-CM | POA: Diagnosis not present

## 2023-05-23 NOTE — Therapy (Signed)
OUTPATIENT PHYSICAL THERAPY LOWER EXTREMITY TREATMENT   Patient Name: CONTRELL EVARISTO MRN: [id number] DOB:Jul 18, 1956, 67 y.o., male Today's Date: 05/23/2023  END OF SESSION:  PT End of Session - 05/23/23 0841     Visit Number 7    Number of Visits 12    Date for PT Re-Evaluation 07/21/23    Authorization Type [employer] - 12 VISITS APPROVED FOR PT 04/28/2023-07/21/2023    Authorization - Visit Number 7    Authorization - Number of Visits 12    PT Start Time 0845    PT Stop Time 0925    PT Time Calculation (min) 40 min    Activity Tolerance Patient tolerated treatment well    Behavior During Therapy WFL for tasks assessed/performed             Past Medical History:  Diagnosis Date   Arthritis    Crohn's disease (HCC)    Depression    Dyspnea on exertion    GERD (gastroesophageal reflux disease)    Herpes    History of kidney stones    Hyperlipidemia    Hypertension    Instability of knee joint    Left displaced femoral neck fracture (HCC) 07/11/2017   Snoring    Past Surgical History:  Procedure Laterality Date   COLONOSCOPY  2007   Dr. Ozzie Hoyle normal TI, ascending colon/cecum with mild inflammation noted but biopses without signs of active inflammation, hyperpastic polyp, possible ischemia on path of left colon biopsy   COLONOSCOPY N/A 11/08/2015   Procedure: COLONOSCOPY;  Surgeon: Corbin Ade, MD;  Location: [facility];  Service: Endoscopy;  Laterality: N/A;   ESOPHAGOGASTRODUODENOSCOPY  2007   Dr. Ozzie Hoyle reflux esophagitis, hiatal hernia, negative H.pylori   ESOPHAGOGASTRODUODENOSCOPY N/A 11/08/2015   Procedure: ESOPHAGOGASTRODUODENOSCOPY (EGD);  Surgeon: Corbin Ade, MD;  Location: [facility];  Service: Endoscopy;  Laterality: N/A;  0830 - moved to 8:15 - office to notify   HIP PINNING,CANNULATED Left 07/12/2017   Procedure: CANNULATED HIP PINNING LEFT;  Surgeon: Samson Frederic, MD;  Location: [facility];  Service: Orthopedics;  Laterality: Left;   KNEE  ARTHROPLASTY Right 04/23/2023   Procedure: COMPUTER ASSISTED TOTAL KNEE ARTHROPLASTY;  Surgeon: Samson Frederic, MD;  Location: [facility];  Service: Orthopedics;  Laterality: Right;  160   Patient Active Problem List   Diagnosis Date Noted   Osteoarthritis of right knee 04/23/2023   Preoperative clearance 02/11/2023   Chronic sinusitis 03/12/2022   Visual disturbance 03/12/2022   Acute sinusitis 01/29/2022   Chronic kidney disease (CKD) stage G2/A2, mildly decreased glomerular filtration rate (GFR) between 60-89 mL/min/1.73 square meter and albuminuria creatinine ratio between 30-299 mg/g 10/22/2018   Hypogonadism male 10/21/2018   Screening PSA (prostate specific antigen) 10/21/2018   Chronic kidney disease due to benign hypertension 10/21/2018   Depression, major, recurrent, in remission (HCC) 10/21/2018   Statin intolerance 09/14/2018   Agatston coronary artery calcium score greater than 400 08/10/2018   Family history of coronary artery disease 08/10/2018   Crohn's disease of colon without complication (HCC) 05/09/2017   Mucosal abnormality of esophagus    Hiatal hernia    KNEE JOINT INSTABILITY 03/07/2007   Hyperlipidemia 12/30/2006   Essential hypertension 12/30/2006   GERD 12/30/2006    PCP: Dr Everrett Coombe  REFERRING PROVIDER: Dr Samson Frederic   REFERRING DIAG: Rt TKA  THERAPY DIAG:  Acute pain of right knee  Stiffness in joint  Muscle weakness (generalized)  Localized edema  Other abnormalities of gait  and mobility  Rationale for Evaluation and Treatment: Rehabilitation  ONSET DATE: 04/23/23  SUBJECTIVE:   SUBJECTIVE STATEMENT: Patient reports he was sore after last visit, states he added to the soreness after last PT session by standing for a long time at a race. Patient states he has been waking up with his knee feeling achy and sore.   PERTINENT HISTORY: HTN; Crones disease; fx of Lt femoral neck with ORIF ~ 4 yrs ago PAIN:  Are you having pain? Yes:  NPRS scale: 7-8/10; am 3/10  Pain location: Rt knee pain  Pain description: constant pressure  Aggravating factors: swelling; walking   Relieving factors: meds; ice; rest   PRECAUTIONS: Knee  WEIGHT BEARING RESTRICTIONS: No  FALLS:  Has patient fallen in last 6 months? 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; avid cyclist ~ 2-3 times per week ~ 40 min; yard work; some household chores; race slot cars   PLOF: Independent  PATIENT GOALS: return all normal activities  NEXT MD VISIT: 2nd week in June (6 wk follow up)  OBJECTIVE:   DIAGNOSTIC FINDINGS: 04/23/23 xray of Rt knee - Acute surgical changes of total knee arthroplasty   PATIENT SURVEYS:  Not completed   COGNITION: Overall cognitive status: Within functional limits for tasks assessed     SENSATION: WFL  EDEMA:  Moderate Rt knee  Rt knee at joint line 42 cm; left 38.5 cm  MUSCLE LENGTH: Hamstrings: Right 65 deg; Left 70 deg Hip flexors; ITB tight bilat Rt > Lt   POSTURE: rounded shoulders, forward head, flexed trunk , and weight shift left  PALPATION:   LOWER EXTREMITY ROM:  Active ROM Right eval Left eval Right 05/08/23 Right 05/21/23  Hip flexion      Hip extension      Hip abduction      Hip adduction      Hip internal rotation      Hip external rotation      Knee flexion 90  113 125  Knee extension -12  -4 -2  Ankle dorsiflexion      Ankle plantarflexion      Ankle inversion      Ankle eversion       (Blank rows = not tested)  LOWER EXTREMITY MMT: strength Lt LE - WNL's   MMT Right eval Left eval  Hip flexion 4+   Hip extension 4   Hip abduction 4   Hip adduction    Hip internal rotation    Hip external rotation    Knee flexion NT   Knee  extension NT   Ankle dorsiflexion    Ankle plantarflexion    Ankle inversion    Ankle eversion     (Blank rows = not tested)  FUNCTIONAL TESTS:  5 times sit to stand: to be assessed   GAIT: Distance walked: 40 Assistive device utilized: Walker - 2 wheeled Level of assistance: Complete Independence Comments: limp Rt LE with wt bearing Rt LE   [facility] Adult PT Treatment:  DATE: 05/23/2023 Therapeutic Exercise: NuStep L5 x 5 min Quad set --> double leg SAQ + ball squeeze at ankles Seated hip add ball squeeze  R LAQ + ball squeeze b/w knees Clock stepper (colored dots) --> R SLS     [facility] Adult PT Treatment:                                                DATE: 05/21/2023 Therapeutic Exercise: Recumbent bike L3 x 5 min --> moving seat closer Knee measurements (see above) 4" step front & lateral foot tap downs x10 each HS stretch on top step 5x10" R SLB no HHA x 7 sec --> fingertip touch x20" Gastroc/soleus stretches on slantboard 2x20" mREO/EC standing on airex Stable ground tandem balance (each leg forward) Standing quad stretch w/strap Modalities: Vaso 34 degrees, med compression x 10 min     [facility] Adult PT Treatment:                                                DATE: 05/15/2023 Therapeutic Exercise: Recumbent bike L3 x 5 min Roller along ITB, posterior hip complex Quad set 10x5" Supine AROM heel slides 10x5" S/L hip abd in extension 2x10 Supine dynamic HS stretch --> bent knee glute stretch Hip hiking (R SLS) from 4" step  TKE 5# pulley x10 --> GTB x 5 Seated knee extension & flexion RTB x10 each Modalities: Vaso 34 degrees, med compression x 10 min     PATIENT EDUCATION:  Education details: Updated HEP  Person educated: Patient Education method: Explanation, Demonstration, Tactile cues, Verbal cues, and Handouts Education comprehension: verbalized understanding, returned demonstration, verbal cues required,  tactile cues required, and needs further education  HOME EXERCISE PROGRAM: Access Code: [email or web address] URL: [email or web address] Date: 05/23/2023 Prepared by: Carlynn Herald  Exercises - Long Sitting Quad Set with Towel Roll Under Heel  - 1 x daily - 7 x weekly - 2 sets - 10 reps - 3 sec hold - Side Stepping with Resistance at Ankles  - 1 x daily - 7 x weekly - 2 sets - 10 reps - Forward and Backward Monster Walk with Resistance at Ankles and Counter Support  - 1 x daily - 7 x weekly - 2 sets - 10 reps - Seated Knee Extension with Resistance  - 1 x daily - 7 x weekly - 3 sets - 10 reps - Standing Terminal Knee Extension with Resistance  - 1 x daily - 7 x weekly - 3 sets - 10 reps - Standing Hamstring Curl with Resistance  - 1 x daily - 7 x weekly - 3 sets - 10 reps - Seated Hip Adduction Isometrics with Ball  - 1 x daily - 7 x weekly - 3 sets - 10 reps - Seated Long Arc Quad with Hip Adduction  - 1 x daily - 7 x weekly - 3 sets - 10 reps - Single Leg Balance with Clock Reach  - 1 x daily - 7 x weekly - 3 sets - 10 reps  ASSESSMENT:  CLINICAL IMPRESSION: Session focused on quad strengthening and stability. Patient progressed to dynamic single leg balance with clock steppers; moderate quad fatigue reported with exercise.   OBJECTIVE IMPAIRMENTS: Abnormal gait, decreased  activity tolerance, decreased balance, decreased mobility, difficulty walking, decreased ROM, decreased strength, increased edema, impaired flexibility, improper body mechanics, postural dysfunction, and pain.   ACTIVITY LIMITATIONS: carrying, lifting, bending, sitting, standing, squatting, sleeping, stairs, transfers, bed mobility, bathing, toileting, dressing, and locomotion level  PARTICIPATION LIMITATIONS: meal prep, cleaning, laundry, driving, shopping, community activity, and yard work  PERSONAL FACTORS: Past/current experiences are also affecting patient's functional outcome.   REHAB POTENTIAL:  Good  CLINICAL DECISION MAKING: Stable/uncomplicated  EVALUATION COMPLEXITY: Low   GOALS: Goals reviewed with patient? Yes  SHORT TERM GOALS: Target date: 06/09/2023  Independent in initial HEP Baseline: Goal status: MET  2.  Increase knee ROM to 0 degrees extension and 110 degrees flexion  Baseline:  Goal status: MET   LONG TERM GOALS: Target date: 07/21/2023  Independent in gait without assistive device with good gait pattern Baseline:  Goal status: INITIAL  2.  AROM Rt knee 0 degrees extension to 125 deg flexion  Baseline:  Goal status: INITIAL  3.  5/5 strength Rt LE  Baseline:  Goal status: INITIAL  4.  Patient reports return to normal functional activities and schedule at home  Baseline:  Goal status: INITIAL  5.  Independent in HEP including aquatic program as indicated  Baseline:  Goal status: INITIAL   PLAN:  PT FREQUENCY: 1-2x/week  PT DURATION: 12 weeks  PLANNED INTERVENTIONS: Therapeutic exercises, Therapeutic activity, Neuromuscular re-education, Balance training, Gait training, Patient/Family education, Self Care, Joint mobilization, Stair training, Aquatic Therapy, Dry Needling, Electrical stimulation, Cryotherapy, Moist heat, Taping, Vasopneumatic device, Ultrasound, Ionotophoresis 4mg /ml Dexamethasone, Manual therapy, and Re-evaluation  PLAN FOR NEXT SESSION: Quad strengthening, SLB/SLS activities. Cycle-specific exercises. Progress with exercises; gait training including stairs; manual work, DN, modalities as indicated    Sanjuana Mae, PTA 05/23/2023, 9:28 AM

## 2023-05-26 ENCOUNTER — Ambulatory Visit: Payer: Medicare HMO | Attending: Orthopedic Surgery

## 2023-05-26 DIAGNOSIS — R600 Localized edema: Secondary | ICD-10-CM | POA: Diagnosis not present

## 2023-05-26 DIAGNOSIS — R2689 Other abnormalities of gait and mobility: Secondary | ICD-10-CM | POA: Diagnosis not present

## 2023-05-26 DIAGNOSIS — M25561 Pain in right knee: Secondary | ICD-10-CM | POA: Insufficient documentation

## 2023-05-26 DIAGNOSIS — M2560 Stiffness of unspecified joint, not elsewhere classified: Secondary | ICD-10-CM | POA: Insufficient documentation

## 2023-05-26 DIAGNOSIS — M6281 Muscle weakness (generalized): Secondary | ICD-10-CM | POA: Diagnosis not present

## 2023-05-26 NOTE — Therapy (Signed)
OUTPATIENT PHYSICAL THERAPY LOWER EXTREMITY TREATMENT   Patient Name: Tyler Gates MRN: [id number] DOB:1956-05-10, 67 y.o., male Today's Date: 05/26/2023  END OF SESSION:  PT End of Session - 05/26/23 1314     Visit Number 8    Number of Visits 12    Date for PT Re-Evaluation 07/21/23    Authorization Type [employer] - 12 VISITS APPROVED FOR PT 04/28/2023-07/21/2023    Authorization - Visit Number 8    Authorization - Number of Visits 12    PT Start Time 1315    PT Stop Time 1405    PT Time Calculation (min) 50 min    Activity Tolerance Patient tolerated treatment well    Behavior During Therapy WFL for tasks assessed/performed             Past Medical History:  Diagnosis Date   Arthritis    Crohn's disease (HCC)    Depression    Dyspnea on exertion    GERD (gastroesophageal reflux disease)    Herpes    History of kidney stones    Hyperlipidemia    Hypertension    Instability of knee joint    Left displaced femoral neck fracture (HCC) 07/11/2017   Snoring    Past Surgical History:  Procedure Laterality Date   COLONOSCOPY  2007   Dr. Ozzie Hoyle normal TI, ascending colon/cecum with mild inflammation noted but biopses without signs of active inflammation, hyperpastic polyp, possible ischemia on path of left colon biopsy   COLONOSCOPY N/A 11/08/2015   Procedure: COLONOSCOPY;  Surgeon: Corbin Ade, MD;  Location: [facility];  Service: Endoscopy;  Laterality: N/A;   ESOPHAGOGASTRODUODENOSCOPY  2007   Dr. Ozzie Hoyle reflux esophagitis, hiatal hernia, negative H.pylori   ESOPHAGOGASTRODUODENOSCOPY N/A 11/08/2015   Procedure: ESOPHAGOGASTRODUODENOSCOPY (EGD);  Surgeon: Corbin Ade, MD;  Location: [facility];  Service: Endoscopy;  Laterality: N/A;  0830 - moved to 8:15 - office to notify   HIP PINNING,CANNULATED Left 07/12/2017   Procedure: CANNULATED HIP PINNING LEFT;  Surgeon: Samson Frederic, MD;  Location: [facility];  Service: Orthopedics;  Laterality: Left;   KNEE  ARTHROPLASTY Right 04/23/2023   Procedure: COMPUTER ASSISTED TOTAL KNEE ARTHROPLASTY;  Surgeon: Samson Frederic, MD;  Location: [facility];  Service: Orthopedics;  Laterality: Right;  160   Patient Active Problem List   Diagnosis Date Noted   Osteoarthritis of right knee 04/23/2023   Preoperative clearance 02/11/2023   Chronic sinusitis 03/12/2022   Visual disturbance 03/12/2022   Acute sinusitis 01/29/2022   Chronic kidney disease (CKD) stage G2/A2, mildly decreased glomerular filtration rate (GFR) between 60-89 mL/min/1.73 square meter and albuminuria creatinine ratio between 30-299 mg/g 10/22/2018   Hypogonadism male 10/21/2018   Screening PSA (prostate specific antigen) 10/21/2018   Chronic kidney disease due to benign hypertension 10/21/2018   Depression, major, recurrent, in remission (HCC) 10/21/2018   Statin intolerance 09/14/2018   Agatston coronary artery calcium score greater than 400 08/10/2018   Family history of coronary artery disease 08/10/2018   Crohn's disease of colon without complication (HCC) 05/09/2017   Mucosal abnormality of esophagus    Hiatal hernia    KNEE JOINT INSTABILITY 03/07/2007   Hyperlipidemia 12/30/2006   Essential hypertension 12/30/2006   GERD 12/30/2006    PCP: Dr Everrett Coombe  REFERRING PROVIDER: Dr Samson Frederic   REFERRING DIAG: Rt TKA  THERAPY DIAG:  Acute pain of right knee  Stiffness in joint  Muscle weakness (generalized)  Localized edema  Other abnormalities of gait  and mobility  Rationale for Evaluation and Treatment: Rehabilitation  ONSET DATE: 04/23/23  SUBJECTIVE:   SUBJECTIVE STATEMENT: Patient reports no pain in knee, states he felt a "clicking" in R knee a few times but no pain ir discomfort.   PERTINENT HISTORY: HTN; Crones disease; fx of Lt femoral neck with ORIF ~ 4 yrs ago PAIN:  Are you having pain? Yes: NPRS scale: 7-8/10; am 3/10  Pain location: Rt knee pain  Pain description: constant pressure   Aggravating factors: swelling; walking   Relieving factors: meds; ice; rest   PRECAUTIONS: Knee  WEIGHT BEARING RESTRICTIONS: No  FALLS:  Has patient fallen in last 6 months? 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; avid cyclist ~ 2-3 times per week ~ 40 min; yard work; some household chores; race slot cars   PLOF: Independent  PATIENT GOALS: return all normal activities  NEXT MD VISIT: 2nd week in June (6 wk follow up)  OBJECTIVE:   DIAGNOSTIC FINDINGS: 04/23/23 xray of Rt knee - Acute surgical changes of total knee arthroplasty   PATIENT SURVEYS:  Not completed   COGNITION: Overall cognitive status: Within functional limits for tasks assessed     SENSATION: WFL  EDEMA:  Moderate Rt knee  Rt knee at joint line 42 cm; left 38.5 cm  MUSCLE LENGTH: Hamstrings: Right 65 deg; Left 70 deg Hip flexors; ITB tight bilat Rt > Lt   POSTURE: rounded shoulders, forward head, flexed trunk, and weight shift left  PALPATION:   LOWER EXTREMITY ROM:  Active ROM Right eval Left eval Right 05/08/23 Right 05/21/23  Hip flexion      Hip extension      Hip abduction      Hip adduction      Hip internal rotation      Hip external rotation      Knee flexion 90  113 125  Knee extension -12  -4 -2  Ankle dorsiflexion      Ankle plantarflexion      Ankle inversion      Ankle eversion       (Blank rows = not tested)  LOWER EXTREMITY MMT: strength Lt LE - WNL's   MMT Right eval Left eval  Hip flexion 4+   Hip extension 4   Hip abduction 4   Hip adduction    Hip internal rotation    Hip external rotation    Knee flexion NT   Knee extension NT   Ankle dorsiflexion    Ankle plantarflexion    Ankle inversion    Ankle  eversion     (Blank rows = not tested)  FUNCTIONAL TESTS:  5 times sit to stand: to be assessed   GAIT: Distance walked: 40 Assistive device utilized: Walker - 2 wheeled Level of assistance: Complete Independence Comments: limp Rt LE with wt bearing Rt LE   [facility] Adult PT Treatment:                                                DATE: 05/26/2023 Therapeutic Exercise: Recumbent bike L6 x 5 min Clock stepper (colored dots) CW/CCW x  5 each 6" R SLS 6" step --> focus on slow eccentric phase x10 Piriformis myofascial release with ball 4" step lateral heel taps --> front heel taps R SLB 3x30", cueing glute activation Side stepping GTB crossed at ankles  Seated: Figure 4 stretch Ball squeeze b/w knees 10x5" HS stretch (foot propped on stool) Stepping on/off airex x 30"  Seated knee flexion stretch Seated double knee extension w/ball b/w ankles Supine SLR" parallel, ER, IR x10 each Modalities: Vaso 34 degrees, med compression x 10 min    [facility] Adult PT Treatment:                                                DATE: 05/23/2023 Therapeutic Exercise: NuStep L5 x 5 min Quad set --> double leg SAQ + ball squeeze at ankles Seated hip add ball squeeze  R LAQ + ball squeeze b/w knees Clock stepper (colored dots) --> R SLS     [facility] Adult PT Treatment:                                                DATE: 05/21/2023 Therapeutic Exercise: Recumbent bike L3 x 5 min --> moving seat closer Knee measurements (see above) 4" step front & lateral foot tap downs x10 each HS stretch on top step 5x10" R SLB no HHA x 7 sec --> fingertip touch x20" Gastroc/soleus stretches on slantboard 2x20" mREO/EC standing on airex Stable ground tandem balance (each leg forward) Standing quad stretch w/strap Modalities: Vaso 34 degrees, med compression x 10 min    PATIENT EDUCATION:  Education details: Updated HEP  Person educated: Patient Education method: Explanation, Demonstration, Tactile cues,  Verbal cues, and Handouts Education comprehension: verbalized understanding, returned demonstration, verbal cues required, tactile cues required, and needs further education  HOME EXERCISE PROGRAM: Access Code: [email or web address] URL: [email or web address] Date: 05/23/2023 Prepared by: Carlynn Herald  Exercises - Long Sitting Quad Set with Towel Roll Under Heel  - 1 x daily - 7 x weekly - 2 sets - 10 reps - 3 sec hold - Side Stepping with Resistance at Ankles  - 1 x daily - 7 x weekly - 2 sets - 10 reps - Forward and Backward Monster Walk with Resistance at Ankles and Counter Support  - 1 x daily - 7 x weekly - 2 sets - 10 reps - Seated Knee Extension with Resistance  - 1 x daily - 7 x weekly - 3 sets - 10 reps - Standing Terminal Knee Extension with Resistance  - 1 x daily - 7 x weekly - 3 sets - 10 reps - Standing Hamstring Curl with Resistance  - 1 x daily - 7 x weekly - 3 sets - 10 reps - Seated Hip Adduction Isometrics with Ball  - 1 x daily - 7 x weekly - 3 sets - 10 reps - Seated Long Arc Quad with Hip Adduction  - 1 x daily - 7 x weekly - 3 sets - 10 reps - Single Leg Balance with Clock Reach  - 1 x daily - 7 x weekly - 3 sets - 10 reps  ASSESSMENT:  CLINICAL IMPRESSION: Right leg stability challenged with single leg stance exercises. Quad activation continued with straight leg  raise variations; noted weakness with leg raises in internal rotation. Cueing right glute activation improved stability and pelvic alignment with single leg balance.   OBJECTIVE IMPAIRMENTS: Abnormal gait, decreased activity tolerance, decreased balance, decreased mobility, difficulty walking, decreased ROM, decreased strength, increased edema, impaired flexibility, improper body mechanics, postural dysfunction, and pain.   ACTIVITY LIMITATIONS: carrying, lifting, bending, sitting, standing, squatting, sleeping, stairs, transfers, bed mobility, bathing, toileting, dressing, and locomotion  level  PARTICIPATION LIMITATIONS: meal prep, cleaning, laundry, driving, shopping, community activity, and yard work  PERSONAL FACTORS: Past/current experiences are also affecting patient's functional outcome.   REHAB POTENTIAL: Good  CLINICAL DECISION MAKING: Stable/uncomplicated  EVALUATION COMPLEXITY: Low   GOALS: Goals reviewed with patient? Yes  SHORT TERM GOALS: Target date: 06/09/2023  Independent in initial HEP Baseline: Goal status: MET  2.  Increase knee ROM to 0 degrees extension and 110 degrees flexion  Baseline:  Goal status: MET   LONG TERM GOALS: Target date: 07/21/2023  Independent in gait without assistive device with good gait pattern Baseline:  Goal status: INITIAL  2.  AROM Rt knee 0 degrees extension to 125 deg flexion  Baseline:  Goal status: INITIAL  3.  5/5 strength Rt LE  Baseline:  Goal status: INITIAL  4.  Patient reports return to normal functional activities and schedule at home  Baseline:  Goal status: INITIAL  5.  Independent in HEP including aquatic program as indicated  Baseline:  Goal status: INITIAL   PLAN:  PT FREQUENCY: 1-2x/week  PT DURATION: 12 weeks  PLANNED INTERVENTIONS: Therapeutic exercises, Therapeutic activity, Neuromuscular re-education, Balance training, Gait training, Patient/Family education, Self Care, Joint mobilization, Stair training, Aquatic Therapy, Dry Needling, Electrical stimulation, Cryotherapy, Moist heat, Taping, Vasopneumatic device, Ultrasound, Ionotophoresis 4mg /ml Dexamethasone, Manual therapy, and Re-evaluation  PLAN FOR NEXT SESSION: Will need more authorization from [facility] after 06/09/23/ visit. Quad strengthening, SLB/SLS activities. Cycle-specific exercises. Progress with exercises; gait training including stairs; manual work, DN, modalities as indicated    Sanjuana Mae, PTA 05/26/2023, 2:04 PM

## 2023-05-28 ENCOUNTER — Ambulatory Visit (INDEPENDENT_AMBULATORY_CARE_PROVIDER_SITE_OTHER): Payer: Medicare HMO

## 2023-05-28 VITALS — BP 133/84 | HR 89 | Temp 97.7°F | Resp 16 | Wt 187.0 lb

## 2023-05-28 DIAGNOSIS — K5010 Crohn's disease of large intestine without complications: Secondary | ICD-10-CM | POA: Diagnosis not present

## 2023-05-28 MED ORDER — ACETAMINOPHEN 325 MG PO TABS
650.0000 mg | ORAL_TABLET | Freq: Once | ORAL | Status: AC
  Administered 2023-05-28: 650 mg via ORAL
  Filled 2023-05-28: qty 2

## 2023-05-28 MED ORDER — METHYLPREDNISOLONE SODIUM SUCC 40 MG IJ SOLR
40.0000 mg | Freq: Once | INTRAMUSCULAR | Status: AC
  Administered 2023-05-28: 40 mg via INTRAVENOUS
  Filled 2023-05-28: qty 1

## 2023-05-28 MED ORDER — SODIUM CHLORIDE 0.9 % IV SOLN
5.0000 mg/kg | Freq: Once | INTRAVENOUS | Status: AC
  Administered 2023-05-28: 400 mg via INTRAVENOUS
  Filled 2023-05-28: qty 40

## 2023-05-28 MED ORDER — DIPHENHYDRAMINE HCL 25 MG PO CAPS: 25.0000 mg | ORAL_CAPSULE | Freq: Once | ORAL | Status: DC

## 2023-05-28 NOTE — Progress Notes (Signed)
Diagnosis: Crohn's Disease  Provider:  Chilton Greathouse MD  Procedure: IV Infusion  IV Type: Peripheral, IV Location: L Hand  Remicade (Infliximab), Dose: 400 mg  Infusion Start Time: 0938  Infusion Stop Time: 1147  Post Infusion IV Care: Peripheral IV Discontinued  Discharge: Condition: Good, Destination: Home . AVS Declined  Performed by:  Loney Hering, LPN

## 2023-05-29 ENCOUNTER — Encounter: Payer: Self-pay | Admitting: Rehabilitative and Restorative Service Providers"

## 2023-05-29 ENCOUNTER — Ambulatory Visit: Payer: Medicare HMO | Admitting: Rehabilitative and Restorative Service Providers"

## 2023-05-29 DIAGNOSIS — M2560 Stiffness of unspecified joint, not elsewhere classified: Secondary | ICD-10-CM

## 2023-05-29 DIAGNOSIS — R600 Localized edema: Secondary | ICD-10-CM

## 2023-05-29 DIAGNOSIS — M25561 Pain in right knee: Secondary | ICD-10-CM

## 2023-05-29 DIAGNOSIS — M6281 Muscle weakness (generalized): Secondary | ICD-10-CM

## 2023-05-29 DIAGNOSIS — R2689 Other abnormalities of gait and mobility: Secondary | ICD-10-CM | POA: Diagnosis not present

## 2023-05-29 NOTE — Therapy (Signed)
OUTPATIENT PHYSICAL THERAPY LOWER EXTREMITY TREATMENT   Patient Name: Tyler Gates MRN: [id number] DOB:[birth year], 67 y.o., male Today's Date: 05/29/2023  END OF SESSION:  PT End of Session - 05/29/23 1056     Visit Number 9    Number of Visits 12    Date for PT Re-Evaluation 07/21/23    Authorization Type [employer] - 12 VISITS APPROVED FOR PT 04/28/2023-07/21/2023    Authorization - Visit Number 9    Authorization - Number of Visits 12    PT Start Time 1056    PT Stop Time 1145    PT Time Calculation (min) 49 min             Past Medical History:  Diagnosis Date   Arthritis    Crohn's disease (HCC)    Depression    Dyspnea on exertion    GERD (gastroesophageal reflux disease)    Herpes    History of kidney stones    Hyperlipidemia    Hypertension    Instability of knee joint    Left displaced femoral neck fracture (HCC) 07/11/2017   Snoring    Past Surgical History:  Procedure Laterality Date   COLONOSCOPY  2007   Dr. Ozzie Hoyle normal TI, ascending colon/cecum with mild inflammation noted but biopses without signs of active inflammation, hyperpastic polyp, possible ischemia on path of left colon biopsy   COLONOSCOPY N/A 11/08/2015   Procedure: COLONOSCOPY;  Surgeon: Corbin Ade, MD;  Location: [facility];  Service: Endoscopy;  Laterality: N/A;   ESOPHAGOGASTRODUODENOSCOPY  2007   Dr. Ozzie Hoyle reflux esophagitis, hiatal hernia, negative H.pylori   ESOPHAGOGASTRODUODENOSCOPY N/A 11/08/2015   Procedure: ESOPHAGOGASTRODUODENOSCOPY (EGD);  Surgeon: Corbin Ade, MD;  Location: [facility];  Service: Endoscopy;  Laterality: N/A;  0830 - moved to 8:15 - office to notify   HIP PINNING,CANNULATED Left 07/12/2017   Procedure: CANNULATED HIP PINNING LEFT;  Surgeon: Samson Frederic, MD;  Location: [facility];  Service: Orthopedics;  Laterality: Left;   KNEE ARTHROPLASTY Right 04/23/2023   Procedure: COMPUTER ASSISTED TOTAL KNEE ARTHROPLASTY;  Surgeon: Samson Frederic, MD;  Location:  [facility];  Service: Orthopedics;  Laterality: Right;  160   Patient Active Problem List   Diagnosis Date Noted   Osteoarthritis of right knee 04/23/2023   Preoperative clearance 02/11/2023   Chronic sinusitis 03/12/2022   Visual disturbance 03/12/2022   Acute sinusitis 01/29/2022   Chronic kidney disease (CKD) stage G2/A2, mildly decreased glomerular filtration rate (GFR) between 60-89 mL/min/1.73 square meter and albuminuria creatinine ratio between 30-299 mg/g 10/22/2018   Hypogonadism male 10/21/2018   Screening PSA (prostate specific antigen) 10/21/2018   Chronic kidney disease due to benign hypertension 10/21/2018   Depression, major, recurrent, in remission (HCC) 10/21/2018   Statin intolerance 09/14/2018   Agatston coronary artery calcium score greater than 400 08/10/2018   Family history of coronary artery disease 08/10/2018   Crohn's disease of colon without complication (HCC) 05/09/2017   Mucosal abnormality of esophagus    Hiatal hernia    KNEE JOINT INSTABILITY 03/07/2007   Hyperlipidemia 12/30/2006   Essential hypertension 12/30/2006   GERD 12/30/2006    PCP: Dr Everrett Coombe  REFERRING PROVIDER: Dr Samson Frederic   REFERRING DIAG: Rt TKA  THERAPY DIAG:  Acute pain of right knee  Stiffness in joint  Muscle weakness (generalized)  Localized edema  Other abnormalities of gait and mobility  Rationale for Evaluation and Treatment: Rehabilitation  ONSET DATE: 04/23/23  SUBJECTIVE:   SUBJECTIVE STATEMENT:  Patient reports no pain or clicking in his Rt knee. Worked so hard last visit he had to sit down and rest but felt good the rest of the day.  PERTINENT HISTORY: HTN; Crones disease; fx of Lt femoral neck with ORIF ~ 4 yrs ago PAIN:  Are you having pain? Yes: NPRS scale: 0/10; am 3/10  Pain location: Rt knee pain  Pain description: stiff when hs stops moving for a while  Aggravating factors: swelling; walking   Relieving factors: meds; ice; rest    PRECAUTIONS: Knee  WEIGHT BEARING RESTRICTIONS: No  FALLS:  Has patient fallen in last 6 months? 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; avid cyclist ~ 2-3 times per week ~ 40 min; yard work; some household chores; race slot cars   PLOF: Independent  PATIENT GOALS: return all normal activities  NEXT MD VISIT: 2nd week in June (6 wk follow up)  OBJECTIVE:   DIAGNOSTIC FINDINGS: 04/23/23 xray of Rt knee - Acute surgical changes of total knee arthroplasty   PATIENT SURVEYS:  Not completed   COGNITION: Overall cognitive status: Within functional limits for tasks assessed     SENSATION: WFL  EDEMA:  Moderate Rt knee  Rt knee at joint line 42 cm; left 38.5 cm  MUSCLE LENGTH: Hamstrings: Right 65 deg; Left 70 deg Hip flexors; ITB tight bilat Rt > Lt   POSTURE: rounded shoulders, forward head, flexed trunk, and weight shift left  PALPATION:   LOWER EXTREMITY ROM:  Active ROM Right eval Left eval Right 05/08/23 Right 05/21/23  Hip flexion      Hip extension      Hip abduction      Hip adduction      Hip internal rotation      Hip external rotation      Knee flexion 90  113 125  Knee extension -12  -4 -2  Ankle dorsiflexion      Ankle plantarflexion      Ankle inversion      Ankle eversion       (Blank rows = not tested)  LOWER EXTREMITY MMT: strength Lt LE - WNL's   MMT Right eval Left eval  Hip flexion 4+   Hip extension 4   Hip abduction 4   Hip adduction    Hip internal rotation    Hip external rotation    Knee flexion NT   Knee extension NT   Ankle dorsiflexion    Ankle plantarflexion    Ankle inversion    Ankle eversion     (Blank rows = not tested)  FUNCTIONAL TESTS:  5 times sit to stand:  to be assessed   GAIT: Distance walked: 40 Assistive device utilized: Walker - 2 wheeled Level of assistance: Complete Independence Comments: limp Rt LE with wt bearing Rt LE   [facility] Adult PT Treatment:                                                DATE: 05/29/2023 Therapeutic Exercise: Recumbent bike L3 -> L6 x 5 min Hamstring stretch sitting 30 sec x 2 R LE Step  up forward 6 in step x 10 x 2  Lateral heel tap 4 in step x 10 x 2  Forward step down Lt LE 4 in step x 10 x 2 Piriformis myofascial release with ball Toe tap to 12 in step alternating LE's x 20  Slider fwd/side/back R/L x 10 x 2 UE support for balance  SLS 30 sec x 3 R/x 2 L UE support for balance as needed Calf raise, heel off step x 10 x 2  Side step green TB above knee leading with heel 10 ft x 5 R/L Hip extension green TB above knees 3 sec hold finger tips on cabinet for balance 10 x 2 R/L Sit to stand on UE support x 10 x 2 Mini squat x 10 x 2 Modalities: Vaso 34 degrees, med compression x 10 min  [facility] Adult PT Treatment:                                                DATE: 05/26/2023 Therapeutic Exercise: Recumbent bike L6 x 5 min Clock stepper (colored dots) CW/CCW x 5 each 6" R SLS 6" step --> focus on slow eccentric phase x10 Piriformis myofascial release with ball 4" step lateral heel taps --> front heel taps R SLB 3x30", cueing glute activation Side stepping GTB crossed at ankles  Seated: Figure 4 stretch Ball squeeze b/w knees 10x5" HS stretch (foot propped on stool) Stepping on/off airex x 30"  Seated knee flexion stretch Seated double knee extension w/ball b/w ankles Supine SLR" parallel, ER, IR x10 each Modalities: Vaso 34 degrees, med compression x 10 min   PATIENT EDUCATION:  Education details: Updated HEP  Person educated: Patient Education method: Explanation, Demonstration, Tactile cues, Verbal cues, and Handouts Education comprehension: verbalized understanding, returned demonstration,  verbal cues required, tactile cues required, and needs further education  HOME EXERCISE PROGRAM: Access Code: [email or web address] URL: [email or web address] Date: 05/23/2023 Prepared by: Carlynn Herald  Exercises - Long Sitting Quad Set with Towel Roll Under Heel  - 1 x daily - 7 x weekly - 2 sets - 10 reps - 3 sec hold - Side Stepping with Resistance at Ankles  - 1 x daily - 7 x weekly - 2 sets - 10 reps - Forward and Backward Monster Walk with Resistance at Ankles and Counter Support  - 1 x daily - 7 x weekly - 2 sets - 10 reps - Seated Knee Extension with Resistance  - 1 x daily - 7 x weekly - 3 sets - 10 reps - Standing Terminal Knee Extension with Resistance  - 1 x daily - 7 x weekly - 3 sets - 10 reps - Standing Hamstring Curl with Resistance  - 1 x daily - 7 x weekly - 3 sets - 10 reps - Seated Hip Adduction Isometrics with Ball  - 1 x daily - 7 x weekly - 3 sets - 10 reps - Seated Long Arc Quad with Hip Adduction  - 1 x daily - 7 x weekly - 3 sets - 10 reps - Single Leg Balance with Clock Reach  - 1 x daily - 7 x weekly - 3 sets - 10 reps  ASSESSMENT:  CLINICAL IMPRESSION: Progressing well with R TKA rehab. Focus on strengthening and ROM. Excellent progress.   OBJECTIVE IMPAIRMENTS: Abnormal gait, decreased activity tolerance, decreased balance, decreased mobility, difficulty walking,  decreased ROM, decreased strength, increased edema, impaired flexibility, improper body mechanics, postural dysfunction, and pain.   ACTIVITY LIMITATIONS: carrying, lifting, bending, sitting, standing, squatting, sleeping, stairs, transfers, bed mobility, bathing, toileting, dressing, and locomotion level  PARTICIPATION LIMITATIONS: meal prep, cleaning, laundry, driving, shopping, community activity, and yard work  PERSONAL FACTORS: Past/current experiences are also affecting patient's functional outcome.   REHAB POTENTIAL: Good  CLINICAL DECISION MAKING: Stable/uncomplicated  EVALUATION  COMPLEXITY: Low   GOALS: Goals reviewed with patient? Yes  SHORT TERM GOALS: Target date: 06/09/2023  Independent in initial HEP Baseline: Goal status: MET  2.  Increase knee ROM to 0 degrees extension and 110 degrees flexion  Baseline:  Goal status: MET   LONG TERM GOALS: Target date: 07/21/2023  Independent in gait without assistive device with good gait pattern Baseline:  Goal status: INITIAL  2.  AROM Rt knee 0 degrees extension to 125 deg flexion  Baseline:  Goal status: INITIAL  3.  5/5 strength Rt LE  Baseline:  Goal status: INITIAL  4.  Patient reports return to normal functional activities and schedule at home  Baseline:  Goal status: INITIAL  5.  Independent in HEP including aquatic program as indicated  Baseline:  Goal status: INITIAL   PLAN:  PT FREQUENCY: 1-2x/week  PT DURATION: 12 weeks  PLANNED INTERVENTIONS: Therapeutic exercises, Therapeutic activity, Neuromuscular re-education, Balance training, Gait training, Patient/Family education, Self Care, Joint mobilization, Stair training, Aquatic Therapy, Dry Needling, Electrical stimulation, Cryotherapy, Moist heat, Taping, Vasopneumatic device, Ultrasound, Ionotophoresis 4mg /ml Dexamethasone, Manual therapy, and Re-evaluation  PLAN FOR NEXT SESSION: Will need more authorization from [facility] after 06/09/23/ visit. Quad strengthening, SLB/SLS activities. Cycle-specific exercises. Progress with exercises; gait training including stairs; manual work, DN, modalities as indicated    [employer], PT 05/29/2023, 11:01 AM

## 2023-06-02 ENCOUNTER — Ambulatory Visit: Payer: Medicare HMO

## 2023-06-02 DIAGNOSIS — M25561 Pain in right knee: Secondary | ICD-10-CM | POA: Diagnosis not present

## 2023-06-02 DIAGNOSIS — R600 Localized edema: Secondary | ICD-10-CM | POA: Diagnosis not present

## 2023-06-02 DIAGNOSIS — M2560 Stiffness of unspecified joint, not elsewhere classified: Secondary | ICD-10-CM

## 2023-06-02 DIAGNOSIS — R2689 Other abnormalities of gait and mobility: Secondary | ICD-10-CM

## 2023-06-02 DIAGNOSIS — M6281 Muscle weakness (generalized): Secondary | ICD-10-CM | POA: Diagnosis not present

## 2023-06-02 NOTE — Therapy (Signed)
OUTPATIENT PHYSICAL THERAPY LOWER EXTREMITY TREATMENT   Patient Name: Tyler Gates MRN: [id number] DOB:1956-01-02, 67 y.o., male Today's Date: 06/02/2023  END OF SESSION:  PT End of Session - 06/02/23 1314     Visit Number 10    Number of Visits 12    Date for PT Re-Evaluation 07/21/23    Authorization Type [employer] - 12 VISITS APPROVED FOR PT 04/28/2023-07/21/2023    Authorization - Visit Number 10    Authorization - Number of Visits 12    PT Start Time 1315    PT Stop Time 1408    PT Time Calculation (min) 53 min    Activity Tolerance Patient tolerated treatment well    Behavior During Therapy WFL for tasks assessed/performed             Past Medical History:  Diagnosis Date   Arthritis    Crohn's disease (HCC)    Depression    Dyspnea on exertion    GERD (gastroesophageal reflux disease)    Herpes    History of kidney stones    Hyperlipidemia    Hypertension    Instability of knee joint    Left displaced femoral neck fracture (HCC) 07/11/2017   Snoring    Past Surgical History:  Procedure Laterality Date   COLONOSCOPY  2007   Dr. Ozzie Hoyle normal TI, ascending colon/cecum with mild inflammation noted but biopses without signs of active inflammation, hyperpastic polyp, possible ischemia on path of left colon biopsy   COLONOSCOPY N/A 11/08/2015   Procedure: COLONOSCOPY;  Surgeon: Corbin Ade, MD;  Location: [facility];  Service: Endoscopy;  Laterality: N/A;   ESOPHAGOGASTRODUODENOSCOPY  2007   Dr. Ozzie Hoyle reflux esophagitis, hiatal hernia, negative H.pylori   ESOPHAGOGASTRODUODENOSCOPY N/A 11/08/2015   Procedure: ESOPHAGOGASTRODUODENOSCOPY (EGD);  Surgeon: Corbin Ade, MD;  Location: [facility];  Service: Endoscopy;  Laterality: N/A;  0830 - moved to 8:15 - office to notify   HIP PINNING,CANNULATED Left 07/12/2017   Procedure: CANNULATED HIP PINNING LEFT;  Surgeon: Samson Frederic, MD;  Location: [facility];  Service: Orthopedics;  Laterality: Left;   KNEE  ARTHROPLASTY Right 04/23/2023   Procedure: COMPUTER ASSISTED TOTAL KNEE ARTHROPLASTY;  Surgeon: Samson Frederic, MD;  Location: [facility];  Service: Orthopedics;  Laterality: Right;  160   Patient Active Problem List   Diagnosis Date Noted   Osteoarthritis of right knee 04/23/2023   Preoperative clearance 02/11/2023   Chronic sinusitis 03/12/2022   Visual disturbance 03/12/2022   Acute sinusitis 01/29/2022   Chronic kidney disease (CKD) stage G2/A2, mildly decreased glomerular filtration rate (GFR) between 60-89 mL/min/1.73 square meter and albuminuria creatinine ratio between 30-299 mg/g 10/22/2018   Hypogonadism male 10/21/2018   Screening PSA (prostate specific antigen) 10/21/2018   Chronic kidney disease due to benign hypertension 10/21/2018   Depression, major, recurrent, in remission (HCC) 10/21/2018   Statin intolerance 09/14/2018   Agatston coronary artery calcium score greater than 400 08/10/2018   Family history of coronary artery disease 08/10/2018   Crohn's disease of colon without complication (HCC) 05/09/2017   Mucosal abnormality of esophagus    Hiatal hernia    KNEE JOINT INSTABILITY 03/07/2007   Hyperlipidemia 12/30/2006   Essential hypertension 12/30/2006   GERD 12/30/2006    PCP: Dr Everrett Coombe  REFERRING PROVIDER: Dr Samson Frederic   REFERRING DIAG: Rt TKA  THERAPY DIAG:  Acute pain of right knee  Stiffness in joint  Muscle weakness (generalized)  Localized edema  Other abnormalities of gait  and mobility  Rationale for Evaluation and Treatment: Rehabilitation  ONSET DATE: 04/23/23  SUBJECTIVE:   SUBJECTIVE STATEMENT: Patient reports no pain in knee, states the only thing he notices is feeling deconditioned with long bouts of physical activity. Patient states he rode his bike and it felt really good, no knee issues.   PERTINENT HISTORY: HTN; Crones disease; fx of Lt femoral neck with ORIF ~ 4 yrs ago PAIN:  Are you having pain? Yes: NPRS scale:  0/10; am 3/10  Pain location: Rt knee pain  Pain description: stiff when hs stops moving for a while  Aggravating factors: swelling; walking   Relieving factors: meds; ice; rest   PRECAUTIONS: Knee  WEIGHT BEARING RESTRICTIONS: No  FALLS:  Has patient fallen in last 6 months? 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; avid cyclist ~ 2-3 times per week ~ 40 min; yard work; some household chores; race slot cars   PLOF: Independent  PATIENT GOALS: return all normal activities  NEXT MD VISIT: 06/03/23  OBJECTIVE:   DIAGNOSTIC FINDINGS: 04/23/23 xray of Rt knee - Acute surgical changes of total knee arthroplasty   PATIENT SURVEYS:  Not completed   COGNITION: Overall cognitive status: Within functional limits for tasks assessed     SENSATION: WFL  EDEMA:  Moderate Rt knee  Rt knee at joint line 42 cm; left 38.5 cm  MUSCLE LENGTH: Hamstrings: Right 65 deg; Left 70 deg Hip flexors; ITB tight bilat Rt > Lt   POSTURE: rounded shoulders, forward head, flexed trunk, and weight shift left  PALPATION:   LOWER EXTREMITY ROM:  Active ROM Right eval Left eval Right 05/08/23 Right 05/21/23 Right 06/02/23  Hip flexion       Hip extension       Hip abduction       Hip adduction       Hip internal rotation       Hip external rotation       Knee flexion 90  113 125 125  Knee extension -12  -4 -2 0  Ankle dorsiflexion       Ankle plantarflexion       Ankle inversion       Ankle eversion        (Blank rows = not tested)  LOWER EXTREMITY MMT: strength Lt LE - WNL's   MMT Right eval Left eval Right 06/02/23  Hip flexion 4+  5  Hip extension 4  5  Hip abduction 4  5  Hip adduction     Hip internal rotation     Hip external  rotation     Knee flexion NT    Knee extension NT    Ankle dorsiflexion     Ankle plantarflexion     Ankle inversion     Ankle eversion      (Blank rows = not tested)  FUNCTIONAL TESTS:  5 times sit to stand: to be assessed   GAIT: Distance walked: 40 Assistive device utilized: Walker - 2 wheeled Level of assistance: Complete Independence Comments: limp Rt LE with wt bearing Rt LE   [facility] Adult PT Treatment:  DATE: 06/02/2023 Therapeutic Exercise: Recumbent bike L3-5 x Review of LTG Stairs (long flight) ascend/descend HHAx1-0 reciprocal gait pattern  Step up/down on stepper 3 x  Lateral step up/down on stepper (reciprocal stepping) 2 x1 min (leading each leg) Elevated heel raises on 4" step 2x10 Gastroc stretch on 4" step  Lateral heel taps 6" step  Bent over hip extension on diagonal x10 --> added YTB x15 Modalities: Vaso 34 degrees, med compression x 10 min    [facility] Adult PT Treatment:                                                DATE: 05/29/2023 Therapeutic Exercise: Recumbent bike L3 -> L6 x 5 min Hamstring stretch sitting 30 sec x 2 R LE Step up forward 6 in step x 10 x 2  Lateral heel tap 4 in step x 10 x 2  Forward step down Lt LE 4 in step x 10 x 2 Piriformis myofascial release with ball Toe tap to 12 in step alternating LE's x 20  Slider fwd/side/back R/L x 10 x 2 UE support for balance  SLS 30 sec x 3 R/x 2 L UE support for balance as needed Calf raise, heel off step x 10 x 2  Side step green TB above knee leading with heel 10 ft x 5 R/L Hip extension green TB above knees 3 sec hold finger tips on cabinet for balance 10 x 2 R/L Sit to stand on UE support x 10 x 2 Mini squat x 10 x 2 Modalities: Vaso 34 degrees, med compression x 10 min  [facility] Adult PT Treatment:                                                DATE: 05/26/2023 Therapeutic Exercise: Recumbent bike L6 x 5 min Clock stepper (colored dots)  CW/CCW x 5 each 6" R SLS 6" step --> focus on slow eccentric phase x10 Piriformis myofascial release with ball 4" step lateral heel taps --> front heel taps R SLB 3x30", cueing glute activation Side stepping GTB crossed at ankles  Seated: Figure 4 stretch Ball squeeze b/w knees 10x5" HS stretch (foot propped on stool) Stepping on/off airex x 30"  Seated knee flexion stretch Seated double knee extension w/ball b/w ankles Supine SLR" parallel, ER, IR x10 each Modalities: Vaso 34 degrees, med compression x 10 min   PATIENT EDUCATION:  Education details: Updated HEP  Person educated: Patient Education method: Explanation, Demonstration, Tactile cues, Verbal cues, and Handouts Education comprehension: verbalized understanding, returned demonstration, verbal cues required, tactile cues required, and needs further education  HOME EXERCISE PROGRAM: Access Code: [email or web address] URL: [email or web address] Date: 05/23/2023 Prepared by: Carlynn Herald  Exercises - Long Sitting Quad Set with Towel Roll Under Heel  - 1 x daily - 7 x weekly - 2 sets - 10 reps - 3 sec hold - Side Stepping with Resistance at Ankles  - 1 x daily - 7 x weekly - 2 sets - 10 reps - Forward and Backward Monster Walk with Resistance at Ankles and Counter Support  - 1 x daily - 7 x weekly - 2 sets - 10 reps - Seated Knee Extension with Resistance  -  1 x daily - 7 x weekly - 3 sets - 10 reps - Standing Terminal Knee Extension with Resistance  - 1 x daily - 7 x weekly - 3 sets - 10 reps - Standing Hamstring Curl with Resistance  - 1 x daily - 7 x weekly - 3 sets - 10 reps - Seated Hip Adduction Isometrics with Ball  - 1 x daily - 7 x weekly - 3 sets - 10 reps - Seated Long Arc Quad with Hip Adduction  - 1 x daily - 7 x weekly - 3 sets - 10 reps - Single Leg Balance with Clock Reach  - 1 x daily - 7 x weekly - 3 sets - 10 reps  ASSESSMENT:  CLINICAL IMPRESSION: Patient continues to making good progress with  knee rehabilitation; reciprocal gait pattern demonstrated during stair navigation. Cueing awareness of equal weight bearing in R LE during walking improved reciprocal gait pattern.   OBJECTIVE IMPAIRMENTS: Abnormal gait, decreased activity tolerance, decreased balance, decreased mobility, difficulty walking, decreased ROM, decreased strength, increased edema, impaired flexibility, improper body mechanics, postural dysfunction, and pain.   ACTIVITY LIMITATIONS: carrying, lifting, bending, sitting, standing, squatting, sleeping, stairs, transfers, bed mobility, bathing, toileting, dressing, and locomotion level  PARTICIPATION LIMITATIONS: meal prep, cleaning, laundry, driving, shopping, community activity, and yard work  PERSONAL FACTORS: Past/current experiences are also affecting patient's functional outcome.   REHAB POTENTIAL: Good  CLINICAL DECISION MAKING: Stable/uncomplicated  EVALUATION COMPLEXITY: Low   GOALS: Goals reviewed with patient? Yes  SHORT TERM GOALS: Target date: 06/09/2023  Independent in initial HEP Baseline: Goal status: MET  2.  Increase knee ROM to 0 degrees extension and 110 degrees flexion  Baseline:  Goal status: MET   LONG TERM GOALS: Target date: 07/21/2023  Independent in gait without assistive device with good gait pattern Baseline:  Goal status: MET  2.  AROM Rt knee 0 degrees extension to 125 deg flexion  Baseline:  Goal status: MET  3.  5/5 strength Rt LE  Baseline:  Goal status: MET  4.  Patient reports return to normal functional activities and schedule at home  Baseline:  Goal status: MET  5.  Independent in HEP including aquatic program as indicated  Baseline:  Goal status: MET   PLAN:  PT FREQUENCY: 1-2x/week  PT DURATION: 12 weeks  PLANNED INTERVENTIONS: Therapeutic exercises, Therapeutic activity, Neuromuscular re-education, Balance training, Gait training, Patient/Family education, Self Care, Joint mobilization, Stair  training, Aquatic Therapy, Dry Needling, Electrical stimulation, Cryotherapy, Moist heat, Taping, Vasopneumatic device, Ultrasound, Ionotophoresis 4mg /ml Dexamethasone, Manual therapy, and Re-evaluation  PLAN FOR NEXT SESSION: Discharge 12th visit. Quad strengthening, SLB/SLS activities.   Carlynn Herald, PTA 06/02/2023 2:01 PM

## 2023-06-03 DIAGNOSIS — Z471 Aftercare following joint replacement surgery: Secondary | ICD-10-CM | POA: Diagnosis not present

## 2023-06-03 DIAGNOSIS — Z96651 Presence of right artificial knee joint: Secondary | ICD-10-CM | POA: Diagnosis not present

## 2023-06-05 ENCOUNTER — Ambulatory Visit: Payer: Medicare HMO

## 2023-06-05 DIAGNOSIS — M2560 Stiffness of unspecified joint, not elsewhere classified: Secondary | ICD-10-CM

## 2023-06-05 DIAGNOSIS — R600 Localized edema: Secondary | ICD-10-CM | POA: Diagnosis not present

## 2023-06-05 DIAGNOSIS — M25561 Pain in right knee: Secondary | ICD-10-CM

## 2023-06-05 DIAGNOSIS — R2689 Other abnormalities of gait and mobility: Secondary | ICD-10-CM

## 2023-06-05 DIAGNOSIS — M6281 Muscle weakness (generalized): Secondary | ICD-10-CM | POA: Diagnosis not present

## 2023-06-05 NOTE — Therapy (Addendum)
OUTPATIENT PHYSICAL THERAPY LOWER EXTREMITY TREATMENT AND DISCHARGE SUMMARY  PHYSICAL THERAPY DISCHARGE SUMMARY  Visits from Start of Care: 11  Current functional level related to goals / functional outcomes: See progress note for discharge status    Remaining deficits: Needs to continue with strengthening and stretching    Education / Equipment: HEP    Patient agrees to discharge. Patient goals were met. Patient is being discharged due to meeting the stated rehab goals. Celyn P. Leonor Liv PT, MPH 06/11/23 3:22 PM  Patient Name: Tyler Gates MRN: [id number] DOB:01/26/1956, 67 y.o., male Today's Date: 06/05/2023  END OF SESSION:  PT End of Session - 06/05/23 1312     Visit Number 11    Number of Visits 12    Date for PT Re-Evaluation 07/21/23    Authorization Type [employer] - 12 VISITS APPROVED FOR PT 04/28/2023-07/21/2023    Authorization - Visit Number 11    Authorization - Number of Visits 12    PT Start Time 1315    PT Stop Time 1348    PT Time Calculation (min) 33 min    Activity Tolerance Patient tolerated treatment well    Behavior During Therapy WFL for tasks assessed/performed             Past Medical History:  Diagnosis Date   Arthritis    Crohn's disease (HCC)    Depression    Dyspnea on exertion    GERD (gastroesophageal reflux disease)    Herpes    History of kidney stones    Hyperlipidemia    Hypertension    Instability of knee joint    Left displaced femoral neck fracture (HCC) 07/11/2017   Snoring    Past Surgical History:  Procedure Laterality Date   COLONOSCOPY  2007   Dr. Ozzie Hoyle normal TI, ascending colon/cecum with mild inflammation noted but biopses without signs of active inflammation, hyperpastic polyp, possible ischemia on path of left colon biopsy   COLONOSCOPY N/A 11/08/2015   Procedure: COLONOSCOPY;  Surgeon: Corbin Ade, MD;  Location: [facility];  Service: Endoscopy;  Laterality: N/A;   ESOPHAGOGASTRODUODENOSCOPY  2007   Dr.  Ozzie Hoyle reflux esophagitis, hiatal hernia, negative H.pylori   ESOPHAGOGASTRODUODENOSCOPY N/A 11/08/2015   Procedure: ESOPHAGOGASTRODUODENOSCOPY (EGD);  Surgeon: Corbin Ade, MD;  Location: [facility];  Service: Endoscopy;  Laterality: N/A;  0830 - moved to 8:15 - office to notify   HIP PINNING,CANNULATED Left 07/12/2017   Procedure: CANNULATED HIP PINNING LEFT;  Surgeon: Samson Frederic, MD;  Location: [facility];  Service: Orthopedics;  Laterality: Left;   KNEE ARTHROPLASTY Right 04/23/2023   Procedure: COMPUTER ASSISTED TOTAL KNEE ARTHROPLASTY;  Surgeon: Samson Frederic, MD;  Location: [facility];  Service: Orthopedics;  Laterality: Right;  160   Patient Active Problem List   Diagnosis Date Noted   Osteoarthritis of right knee 04/23/2023   Preoperative clearance 02/11/2023   Chronic sinusitis 03/12/2022   Visual disturbance 03/12/2022   Acute sinusitis 01/29/2022   Chronic kidney disease (CKD) stage G2/A2, mildly decreased glomerular filtration rate (GFR) between 60-89 mL/min/1.73 square meter and albuminuria creatinine ratio between 30-299 mg/g 10/22/2018   Hypogonadism male 10/21/2018   Screening PSA (prostate specific antigen) 10/21/2018   Chronic kidney disease due to benign hypertension 10/21/2018   Depression, major, recurrent, in remission (HCC) 10/21/2018   Statin intolerance 09/14/2018   Agatston coronary artery calcium score greater than 400 08/10/2018   Family history of coronary artery disease 08/10/2018   Crohn's disease of colon  without complication (HCC) 05/09/2017   Mucosal abnormality of esophagus    Hiatal hernia    KNEE JOINT INSTABILITY 03/07/2007   Hyperlipidemia 12/30/2006   Essential hypertension 12/30/2006   GERD 12/30/2006    PCP: Dr Everrett Coombe  REFERRING PROVIDER: Dr Samson Frederic   REFERRING DIAG: Rt TKA  THERAPY DIAG:  Acute pain of right knee  Stiffness in joint  Muscle weakness (generalized)  Localized edema  Other abnormalities of gait and  mobility  Rationale for Evaluation and Treatment: Rehabilitation  ONSET DATE: 04/23/23  SUBJECTIVE:   SUBJECTIVE STATEMENT: Patient reports he is doing well and would like to discharge today. Patient states he rode his bike for 8 miles and felt good afterwards, states he noticed he is actively using R leg more when cycling.   PERTINENT HISTORY: HTN; Crones disease; fx of Lt femoral neck with ORIF ~ 4 yrs ago PAIN:  Are you having pain? Yes: NPRS scale: 0/10; am 3/10  Pain location: Rt knee pain  Pain description: stiff when hs stops moving for a while  Aggravating factors: swelling; walking   Relieving factors: meds; ice; rest   PRECAUTIONS: Knee  WEIGHT BEARING RESTRICTIONS: No  FALLS:  Has patient fallen in last 6 months? 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; avid cyclist ~ 2-3 times per week ~ 40 min; yard work; some household chores; race slot cars   PLOF: Independent  PATIENT GOALS: return all normal activities  NEXT MD VISIT: 06/03/23  OBJECTIVE:   DIAGNOSTIC FINDINGS: 04/23/23 xray of Rt knee - Acute surgical changes of total knee arthroplasty   PATIENT SURVEYS:  Not completed   COGNITION: Overall cognitive status: Within functional limits for tasks assessed     SENSATION: WFL  EDEMA:  Moderate Rt knee  Rt knee at joint line 42 cm; left 38.5 cm  MUSCLE LENGTH: Hamstrings: Right 65 deg; Left 70 deg Hip flexors; ITB tight bilat Rt > Lt   POSTURE: rounded shoulders, forward head, flexed trunk, and weight shift left  PALPATION:   LOWER EXTREMITY ROM:  Active ROM Right eval Left eval Right 05/08/23 Right 05/21/23 Right 06/02/23  Hip flexion       Hip extension       Hip abduction       Hip adduction        Hip internal rotation       Hip external rotation       Knee flexion 90  113 125 125  Knee extension -12  -4 -2 0  Ankle dorsiflexion       Ankle plantarflexion       Ankle inversion       Ankle eversion        (Blank rows = not tested)  LOWER EXTREMITY MMT: strength Lt LE - WNL's   MMT Right eval Left eval Right 06/02/23  Hip flexion 4+  5  Hip extension 4  5  Hip abduction 4  5  Hip adduction     Hip internal rotation     Hip external rotation     Knee flexion NT    Knee extension NT    Ankle dorsiflexion     Ankle plantarflexion     Ankle inversion  Ankle eversion      (Blank rows = not tested)  FUNCTIONAL TESTS:  5 times sit to stand: to be assessed   GAIT: Distance walked: 40 Assistive device utilized: Environmental consultant - 2 wheeled Level of assistance: Complete Independence Comments: limp Rt LE with wt bearing Rt LE   [facility] Adult PT Treatment:                                                DATE: 06/05/2023 Therapeutic Exercise: Recumbent bike L3-7 x SLB fingertip touch 2x30" Colored dot toe taps (Y) Gastroc stretch on 4" step 4" step heel raises --> R SLS heel raises x10 3-way lunges with slider x10 each 6" step heel tap downs lateral 2x10 4" step heel tap downs lateral 2x10 Leg press: DL 604# V40 --> R SL  Kneeling R tap downs on airex    [facility] Adult PT Treatment:                                                DATE: 06/02/2023 Therapeutic Exercise: Recumbent bike L3-5 x Review of LTG Stairs (long flight) ascend/descend HHAx1-0 reciprocal gait pattern  Step up/down on stepper 3 x  Lateral step up/down on stepper (reciprocal stepping) 2 x1 min (leading each leg) Elevated heel raises on 4" step 2x10 Gastroc stretch on 4" step  Lateral heel taps 6" step  Bent over hip extension on diagonal x10 --> added YTB x15 Modalities: Vaso 34 degrees, med compression x 10 min    [facility] Adult PT Treatment:                                                 DATE: 05/29/2023 Therapeutic Exercise: Recumbent bike L3 -> L6 x 5 min Hamstring stretch sitting 30 sec x 2 R LE Step up forward 6 in step x 10 x 2  Lateral heel tap 4 in step x 10 x 2  Forward step down Lt LE 4 in step x 10 x 2 Piriformis myofascial release with ball Toe tap to 12 in step alternating LE's x 20  Slider fwd/side/back R/L x 10 x 2 UE support for balance  SLS 30 sec x 3 R/x 2 L UE support for balance as needed Calf raise, heel off step x 10 x 2  Side step green TB above knee leading with heel 10 ft x 5 R/L Hip extension green TB above knees 3 sec hold finger tips on cabinet for balance 10 x 2 R/L Sit to stand on UE support x 10 x 2 Mini squat x 10 x 2 Modalities: Vaso 34 degrees, med compression x 10 min   PATIENT EDUCATION:  Education details: Updated HEP Person educated: Patient Education method: Explanation, Demonstration, Tactile cues, Verbal cues, and Handouts Education comprehension: verbalized understanding, returned demonstration, verbal cues required, tactile cues required, and needs further education  HOME EXERCISE PROGRAM: Access Code: [email or web address] URL: [email or web address] Date: 05/23/2023 Prepared by: Carlynn Herald  Exercises - Long Sitting Quad Set with Towel Roll Under Heel  - 1 x daily - 7 x  weekly - 2 sets - 10 reps - 3 sec hold - Side Stepping with Resistance at Ankles  - 1 x daily - 7 x weekly - 2 sets - 10 reps - Forward and Backward Monster Walk with Resistance at Ankles and Counter Support  - 1 x daily - 7 x weekly - 2 sets - 10 reps - Seated Knee Extension with Resistance  - 1 x daily - 7 x weekly - 3 sets - 10 reps - Standing Terminal Knee Extension with Resistance  - 1 x daily - 7 x weekly - 3 sets - 10 reps - Standing Hamstring Curl with Resistance  - 1 x daily - 7 x weekly - 3 sets - 10 reps - Seated Hip Adduction Isometrics with Ball  - 1 x daily - 7 x weekly - 3 sets - 10 reps - Seated Long Arc Quad with Hip Adduction  - 1 x  daily - 7 x weekly - 3 sets - 10 reps - Single Leg Balance with Clock Reach  - 1 x daily - 7 x weekly - 3 sets - 10 reps  ASSESSMENT:  CLINICAL IMPRESSION: Patient has met all goals in as outlined in POC. Single leg stability exercises challenged with dynamic balance with toe tap and slider lunge variations. Good form and body mechanics demonstrated during lateral and forward heel taps downs on stairs. Gradual increase in weight bearing in kneeling with R knee; patient tolerated activity well with progression to anterior/posterior rocking on R in kneeling.    OBJECTIVE IMPAIRMENTS: Abnormal gait, decreased activity tolerance, decreased balance, decreased mobility, difficulty walking, decreased ROM, decreased strength, increased edema, impaired flexibility, improper body mechanics, postural dysfunction, and pain.   ACTIVITY LIMITATIONS: carrying, lifting, bending, sitting, standing, squatting, sleeping, stairs, transfers, bed mobility, bathing, toileting, dressing, and locomotion level  PARTICIPATION LIMITATIONS: meal prep, cleaning, laundry, driving, shopping, community activity, and yard work  PERSONAL FACTORS: Past/current experiences are also affecting patient's functional outcome.   REHAB POTENTIAL: Good  CLINICAL DECISION MAKING: Stable/uncomplicated  EVALUATION COMPLEXITY: Low   GOALS: Goals reviewed with patient? Yes  SHORT TERM GOALS: Target date: 06/09/2023  Independent in initial HEP Baseline: Goal status: MET  2.  Increase knee ROM to 0 degrees extension and 110 degrees flexion  Baseline:  Goal status: MET   LONG TERM GOALS: Target date: 07/21/2023  Independent in gait without assistive device with good gait pattern Baseline:  Goal status: MET  2.  AROM Rt knee 0 degrees extension to 125 deg flexion  Baseline:  Goal status: MET  3.  5/5 strength Rt LE  Baseline:  Goal status: MET  4.  Patient reports return to normal functional activities and schedule at  home  Baseline:  Goal status: MET  5.  Independent in HEP including aquatic program as indicated  Baseline:  Goal status: MET   PLAN:  PT FREQUENCY: 1-2x/week  PT DURATION: 12 weeks  PLANNED INTERVENTIONS: Therapeutic exercises, Therapeutic activity, Neuromuscular re-education, Balance training, Gait training, Patient/Family education, Self Care, Joint mobilization, Stair training, Aquatic Therapy, Dry Needling, Electrical stimulation, Cryotherapy, Moist heat, Taping, Vasopneumatic device, Ultrasound, Ionotophoresis 4mg /ml Dexamethasone, Manual therapy, and Re-evaluation  PLAN FOR NEXT SESSION: Patient taking one week off to determine if final apt needed; Discharge  Carlynn Herald, PTA 06/05/2023 1:51 PM

## 2023-06-09 ENCOUNTER — Encounter: Payer: Medicare HMO | Admitting: Rehabilitative and Restorative Service Providers"

## 2023-07-08 ENCOUNTER — Ambulatory Visit
Admission: EM | Admit: 2023-07-08 | Discharge: 2023-07-08 | Disposition: A | Discharge status: Home / Self Care | Payer: Medicare HMO | Attending: Family Medicine | Admitting: Family Medicine

## 2023-07-08 DIAGNOSIS — T7840XA Allergy, unspecified, initial encounter: Secondary | ICD-10-CM

## 2023-07-08 DIAGNOSIS — L233 Allergic contact dermatitis due to drugs in contact with skin: Secondary | ICD-10-CM | POA: Diagnosis not present

## 2023-07-08 MED ORDER — TRIAMCINOLONE ACETONIDE 0.1 % EX OINT: 1.0000 | TOPICAL_OINTMENT | Freq: Two times a day (BID) | CUTANEOUS | 0 refills | Status: DC

## 2023-07-08 NOTE — ED Triage Notes (Signed)
[facility] with [facility] of right knee rash post knee surgery 9 weeks ago. Pt reports he noticed the rash around the 4 week post op area and was seen by his orthopedist and was given an antibiotic. He completed this with no improvement then they sent in doxycycline but pt was unable to tolerate this antibiotic due to his chrons disease.   Pt has been putting otc antibiotic creams on it. Pt reports worsening over the last two days with increased oozing and itching.

## 2023-07-08 NOTE — Discharge Instructions (Signed)
Stop neosporin, do not use in future Apply the triamcinolone ointment 2 x a day and rub in  If antibiotic ointment needed in future use bacitracin

## 2023-07-08 NOTE — ED Provider Notes (Signed)
[facility]    CSN: [id number] Arrival date & time: 07/08/23  0951      History   Chief Complaint Chief Complaint  Patient presents with   Rash    HPI Tyler Gates is a 67 y.o. male.   HPIs  See nurse note Rash on knee since knee replacement Has been using neosporin cream daily No pain Knee doing well Moderate itching  Past Medical History:  Diagnosis Date   Arthritis    Crohn's disease (HCC)    Depression    Dyspnea on exertion    GERD (gastroesophageal reflux disease)    Herpes    History of kidney stones    Hyperlipidemia    Hypertension    Instability of knee joint    Left displaced femoral neck fracture (HCC) 07/11/2017   Snoring     Patient Active Problem List   Diagnosis Date Noted   Osteoarthritis of right knee 04/23/2023   Preoperative clearance 02/11/2023   Chronic sinusitis 03/12/2022   Visual disturbance 03/12/2022   Acute sinusitis 01/29/2022   Chronic kidney disease (CKD) stage G2/A2, mildly decreased glomerular filtration rate (GFR) between 60-89 mL/min/1.73 square meter and albuminuria creatinine ratio between 30-299 mg/g 10/22/2018   Hypogonadism male 10/21/2018   Screening PSA (prostate specific antigen) 10/21/2018   Chronic kidney disease due to benign hypertension 10/21/2018   Depression, major, recurrent, in remission (HCC) 10/21/2018   Statin intolerance 09/14/2018   Agatston coronary artery calcium score greater than 400 08/10/2018   Family history of coronary artery disease 08/10/2018   Crohn's disease of colon without complication (HCC) 05/09/2017   Mucosal abnormality of esophagus    Hiatal hernia    KNEE JOINT INSTABILITY 03/07/2007   Hyperlipidemia 12/30/2006   Essential hypertension 12/30/2006   GERD 12/30/2006    Past Surgical History:  Procedure Laterality Date   COLONOSCOPY  2007   Dr. Ozzie Hoyle normal TI, ascending colon/cecum with mild inflammation noted but biopses without signs of active inflammation,  hyperpastic polyp, possible ischemia on path of left colon biopsy   COLONOSCOPY N/A 11/08/2015   Procedure: COLONOSCOPY;  Surgeon: Corbin Ade, MD;  Location: [facility];  Service: Endoscopy;  Laterality: N/A;   ESOPHAGOGASTRODUODENOSCOPY  2007   Dr. Ozzie Hoyle reflux esophagitis, hiatal hernia, negative H.pylori   ESOPHAGOGASTRODUODENOSCOPY N/A 11/08/2015   Procedure: ESOPHAGOGASTRODUODENOSCOPY (EGD);  Surgeon: Corbin Ade, MD;  Location: [facility];  Service: Endoscopy;  Laterality: N/A;  0830 - moved to 8:15 - office to notify   HIP PINNING,CANNULATED Left 07/12/2017   Procedure: CANNULATED HIP PINNING LEFT;  Surgeon: Samson Frederic, MD;  Location: [facility];  Service: Orthopedics;  Laterality: Left;   KNEE ARTHROPLASTY Right 04/23/2023   Procedure: COMPUTER ASSISTED TOTAL KNEE ARTHROPLASTY;  Surgeon: Samson Frederic, MD;  Location: [facility];  Service: Orthopedics;  Laterality: Right;  160       Home Medications    Prior to Admission medications   Medication Sig Start Date End Date Taking? Authorizing Provider  triamcinolone ointment (KENALOG) 0.1 % Apply 1 Application topically 2 (two) times daily. 07/08/23  Yes Eustace Moore, MD  amLODipine (NORVASC) 5 MG tablet TAKE 2 TABLETS BY MOUTH EVERY DAY Patient taking differently: Take 7.5 mg by mouth daily. 02/25/23   Lewayne Bunting, MD  Cholecalciferol (VITAMIN D3 PO) Take 1 tablet by mouth daily.    [provider]  ezetimibe (ZETIA) 10 MG tablet TAKE 1 TABLET BY MOUTH EVERY DAY 05/02/23  Lewayne Bunting, MD  loperamide (IMODIUM) 2 MG capsule Take 2 mg by mouth as needed for diarrhea or loose stools.    [provider]  meloxicam (MOBIC) 15 MG tablet Take 15 mg by mouth daily. 07/08/22   [provider]  olmesartan (BENICAR) 20 MG tablet Take 1 tablet (20 mg total) by mouth daily. 08/21/22   Lewayne Bunting, MD  omeprazole (PRILOSEC) 40 MG capsule TAKE 1 CAPSULE (40 MG TOTAL) BY MOUTH IN THE MORNING AND AT  BEDTIME. 05/12/23   Everrett Coombe, DO  ondansetron (ZOFRAN) 4 MG tablet Take 1 tablet (4 mg total) by mouth every 8 (eight) hours as needed for nausea or vomiting. 04/23/23   Swinteck, Arlys John, MD  polyethylene glycol (MIRALAX) 17 g packet Take 17 g by mouth daily as needed for moderate constipation or severe constipation. 04/23/23   Swinteck, Arlys John, MD  rosuvastatin (CRESTOR) 20 MG tablet TAKE 1 TABLET BY MOUTH EVERY DAY 05/02/23   Lewayne Bunting, MD  venlafaxine (EFFEXOR) 37.5 MG tablet TAKE 1 TABLET BY MOUTH TWICE A DAY 04/28/23   Everrett Coombe, DO    Family History Family History  Problem Relation Age of Onset   Melanoma Father    Heart disease Father    Healthy Mother    Colon cancer Neg Hx    Esophageal cancer Neg Hx     Social History Social History   Tobacco Use   Smoking status: Never   Smokeless tobacco: Never  Vaping Use   Vaping status: Never Used  Substance Use Topics   Alcohol use: Not Currently   Drug use: No     Allergies   Sulfasalazine and Statins   Review of Systems Review of Systems  See HPI Physical Exam Triage Vital Signs ED Triage Vitals [07/08/23 1008]  Encounter Vitals Group     BP (!) 139/92     Systolic BP Percentile      Diastolic BP Percentile      Pulse Rate 97     Resp 16     Temp 97.7 F (36.5 C)     Temp src      SpO2 98 %     Weight      Height      Head Circumference      Peak Flow      Pain Score 0     Pain Loc      Pain Education      Exclude from Growth Chart    No data found.  Updated Vital Signs BP (!) 139/92   Pulse 97   Temp 97.7 F (36.5 C)   Resp 16   SpO2 98%       Physical Exam Constitutional:      General: He is not in acute distress.    Appearance: He is well-developed.  HENT:     Head: Normocephalic and atraumatic.  Eyes:     Conjunctiva/sclera: Conjunctivae normal.     Pupils: Pupils are equal, round, and reactive to light.  Cardiovascular:     Rate and Rhythm: Normal rate.  Pulmonary:      Effort: Pulmonary effort is normal. No respiratory distress.  Abdominal:     General: There is no distension.     Palpations: Abdomen is soft.  Musculoskeletal:        General: No swelling or tenderness. Normal range of motion.     Cervical back: Normal range of motion.  Skin:    General:  Skin is warm and dry.     Findings: Rash present.     Comments: Vesicular weeping rash, no tenderness  Neurological:     Mental Status: He is alert.      UC Treatments / Results  Labs (all labs ordered are listed, but only abnormal results are displayed) Labs Reviewed - No data to display  EKG   Radiology No results found.  Procedures Procedures (including critical care time)  Medications Ordered in UC Medications - No data to display  Initial Impression / Assessment and Plan / UC Course  I have reviewed the triage vital signs and the nursing notes.  Pertinent labs & imaging results that were available during my care of the patient were reviewed by me and considered in my medical decision making (see chart for details).     May be allergic to neomycin.  Rash looks allergic/inflammatory, not infected.  Hope not a reaction to internal sutures.   Final Clinical Impressions(s) / UC Diagnoses   Final diagnoses:  Allergic contact dermatitis due to drugs in contact with skin  Allergic reaction, initial encounter     Discharge Instructions      Stop neosporin, do not use in future Apply the triamcinolone ointment 2 x a day and rub in  If antibiotic ointment needed in future use bacitracin   ED Prescriptions     Medication Sig Dispense Auth. Provider   triamcinolone ointment (KENALOG) 0.1 % Apply 1 Application topically 2 (two) times daily. 45 g Eustace Moore, MD      PDMP not reviewed this encounter.   Eustace Moore, MD 07/08/23 1034

## 2023-07-22 DIAGNOSIS — D225 Melanocytic nevi of trunk: Secondary | ICD-10-CM | POA: Diagnosis not present

## 2023-07-22 DIAGNOSIS — Z1283 Encounter for screening for malignant neoplasm of skin: Secondary | ICD-10-CM | POA: Diagnosis not present

## 2023-07-22 DIAGNOSIS — L308 Other specified dermatitis: Secondary | ICD-10-CM | POA: Diagnosis not present

## 2023-07-22 DIAGNOSIS — D485 Neoplasm of uncertain behavior of skin: Secondary | ICD-10-CM | POA: Diagnosis not present

## 2023-07-22 DIAGNOSIS — D2272 Melanocytic nevi of left lower limb, including hip: Secondary | ICD-10-CM | POA: Diagnosis not present

## 2023-07-23 ENCOUNTER — Ambulatory Visit (INDEPENDENT_AMBULATORY_CARE_PROVIDER_SITE_OTHER): Payer: Medicare HMO

## 2023-07-23 ENCOUNTER — Other Ambulatory Visit: Payer: Self-pay | Admitting: Cardiology

## 2023-07-23 VITALS — BP 136/78 | HR 75 | Temp 98.1°F | Resp 18 | Ht 69.0 in | Wt 187.0 lb

## 2023-07-23 DIAGNOSIS — E785 Hyperlipidemia, unspecified: Secondary | ICD-10-CM

## 2023-07-23 DIAGNOSIS — K5010 Crohn's disease of large intestine without complications: Secondary | ICD-10-CM

## 2023-07-23 MED ORDER — METHYLPREDNISOLONE SODIUM SUCC 40 MG IJ SOLR
40.0000 mg | Freq: Once | INTRAMUSCULAR | Status: AC
  Administered 2023-07-23: 40 mg via INTRAVENOUS
  Filled 2023-07-23: qty 1

## 2023-07-23 MED ORDER — DIPHENHYDRAMINE HCL 25 MG PO CAPS
25.0000 mg | ORAL_CAPSULE | Freq: Once | ORAL | Status: DC
  Filled 2023-07-23: qty 1

## 2023-07-23 MED ORDER — SODIUM CHLORIDE 0.9 % IV SOLN
5.0000 mg/kg | Freq: Once | INTRAVENOUS | Status: AC
  Administered 2023-07-23: 400 mg via INTRAVENOUS
  Filled 2023-07-23: qty 40

## 2023-07-23 MED ORDER — ACETAMINOPHEN 325 MG PO TABS
650.0000 mg | ORAL_TABLET | Freq: Once | ORAL | Status: AC
  Administered 2023-07-23: 650 mg via ORAL
  Filled 2023-07-23: qty 2

## 2023-07-23 NOTE — Progress Notes (Signed)
Diagnosis: Crohn's Disease  Provider:  Chilton Greathouse MD  Procedure: IV Infusion  IV Type: Peripheral, IV Location: L Hand  Remicade (Infliximab), Dose: 400 mg  Infusion Start Time: 1011  Infusion Stop Time: 1220  Post Infusion IV Care: Observation period completed. PIV removed  Discharge: Condition: Good, Destination: Home . AVS Declined  Performed by:  Rico Ala, LPN

## 2023-07-27 ENCOUNTER — Other Ambulatory Visit: Payer: Self-pay | Admitting: Family Medicine

## 2023-07-27 DIAGNOSIS — F3340 Major depressive disorder, recurrent, in remission, unspecified: Secondary | ICD-10-CM

## 2023-07-29 ENCOUNTER — Other Ambulatory Visit: Payer: Self-pay | Admitting: Cardiology

## 2023-07-29 DIAGNOSIS — E785 Hyperlipidemia, unspecified: Secondary | ICD-10-CM

## 2023-08-01 ENCOUNTER — Ambulatory Visit (INDEPENDENT_AMBULATORY_CARE_PROVIDER_SITE_OTHER): Payer: Medicare HMO | Admitting: Family Medicine

## 2023-08-01 ENCOUNTER — Encounter: Payer: Self-pay | Admitting: Family Medicine

## 2023-08-01 VITALS — BP 127/86 | HR 109 | Ht 69.0 in | Wt 188.0 lb

## 2023-08-01 DIAGNOSIS — R21 Rash and other nonspecific skin eruption: Secondary | ICD-10-CM | POA: Diagnosis not present

## 2023-08-01 DIAGNOSIS — I10 Essential (primary) hypertension: Secondary | ICD-10-CM | POA: Diagnosis not present

## 2023-08-01 DIAGNOSIS — R52 Pain, unspecified: Secondary | ICD-10-CM | POA: Diagnosis not present

## 2023-08-01 MED ORDER — PREDNISONE 10 MG (21) PO TBPK: ORAL_TABLET | ORAL | 0 refills | Status: DC

## 2023-08-01 NOTE — Assessment & Plan Note (Signed)
BP remains well controlled.  We'll plan to continue current antihypertensive regimen.

## 2023-08-01 NOTE — Assessment & Plan Note (Signed)
Unclear etiology. Would favor this to be eczematous in nature.  Possible extraintestinal manifestation of his Crohns but this seems well controlled.  Adding steroid taper.  May continue triamcinolone as needed as well.  Continue antihistamine.

## 2023-08-01 NOTE — Patient Instructions (Signed)
Continue antihistamine. Triamcinolone cream as needed.  Add steroid taper.  Let me know if not improving.  We'll be in touch with lab results.

## 2023-08-01 NOTE — Progress Notes (Signed)
Tyler Gates - 67 y.o. male MRN [id number]  Date of birth: [birth year]  Subjective Chief Complaint  Patient presents with   Rash   Post-op Problem    HPI Tyler Gates is a 67 y.o. male here today for follow up visit.     He had knee surgery in May.  Did pretty well initially but is now having increased fluid on the knee.  He did have rash along incision and was seen at urgent care.  Triamcinolone cream added with improvement of rash.  He has had random itchy bumps that pop up on his legs and torso since that time.  Triamcinolone has helped but it seems that these are occurring more often.  He has been using an antihistamine as well which seems to help some.  He is planning on seeing his orthopedic doctor to address the fluid in his knee.  He has generalized body aches as well for about a week.  He didn't really notice this until after a yellowjacket sting a little over a week ago.  This does improve with tylenol.  He denies fever, chills, or other symptoms at this time.  History of Crohn's disease.  Managed by GI.  Remains on Remicade.  Reports that disease is stable at this time.   He continues on benicar and amlodipine at current strength.  BP is well controlled.  History of elevated coronary calcium score as well.  He has seen cardiology previously.  He is on high intensity statin with crestor 20mg  daily.  Tolerating this well at this time.   ROS:  A comprehensive ROS was completed and negative except as noted per HPI   Allergies  Allergen Reactions   Sulfasalazine Other (See Comments)    Flu like feelings   Statins Other (See Comments)    REACTION: Myalgia    Past Medical History:  Diagnosis Date   Arthritis    Crohn's disease (HCC)    Depression    Dyspnea on exertion    GERD (gastroesophageal reflux disease)    Herpes    History of kidney stones    Hyperlipidemia    Hypertension    Instability of knee joint    Left displaced femoral neck fracture (HCC) 07/11/2017    Snoring     Past Surgical History:  Procedure Laterality Date   COLONOSCOPY  2007   Dr. Ozzie Hoyle normal TI, ascending colon/cecum with mild inflammation noted but biopses without signs of active inflammation, hyperpastic polyp, possible ischemia on path of left colon biopsy   COLONOSCOPY N/A 11/08/2015   Procedure: COLONOSCOPY;  Surgeon: Corbin Ade, MD;  Location: [facility];  Service: Endoscopy;  Laterality: N/A;   ESOPHAGOGASTRODUODENOSCOPY  2007   Dr. Ozzie Hoyle reflux esophagitis, hiatal hernia, negative H.pylori   ESOPHAGOGASTRODUODENOSCOPY N/A 11/08/2015   Procedure: ESOPHAGOGASTRODUODENOSCOPY (EGD);  Surgeon: Corbin Ade, MD;  Location: [facility];  Service: Endoscopy;  Laterality: N/A;  0830 - moved to 8:15 - office to notify   HIP PINNING,CANNULATED Left 07/12/2017   Procedure: CANNULATED HIP PINNING LEFT;  Surgeon: Samson Frederic, MD;  Location: [facility];  Service: Orthopedics;  Laterality: Left;   KNEE ARTHROPLASTY Right 04/23/2023   Procedure: COMPUTER ASSISTED TOTAL KNEE ARTHROPLASTY;  Surgeon: Samson Frederic, MD;  Location: [facility];  Service: Orthopedics;  Laterality: Right;  160    Social History   Socioeconomic History   Marital status: Married    Spouse name: Casten Gregorich   Number of children: 2  Years of education: 71   Highest education level: Some college, no degree  Occupational History   Occupation: Retired  Tobacco Use   Smoking status: Never   Smokeless tobacco: Never  Vaping Use   Vaping status: Never Used  Substance and Sexual Activity   Alcohol use: Not Currently   Drug use: No   Sexual activity: Not Currently    Birth control/protection: None  Other Topics Concern   Not on file  Social History Narrative   Lives with wife. He has two children. He enjoys cycling, kayaking and working on model cars.   Social Determinants of Health   Financial Resource Strain: Low Risk  (03/27/2022)   Overall Financial Resource Strain (CARDIA)    Difficulty of Paying  Living Expenses: Not hard at all  Food Insecurity: No Food Insecurity (03/27/2022)   Hunger Vital Sign    Worried About Running Out of Food in the Last Year: Never true    Ran Out of Food in the Last Year: Never true  Transportation Needs: No Transportation Needs (03/27/2022)   PRAPARE - Administrator, Civil Service (Medical): No    Lack of Transportation (Non-Medical): No  Physical Activity: Sufficiently Active (03/27/2022)   Exercise Vital Sign    Days of Exercise per Week: 3 days    Minutes of Exercise per Session: 60 min  Stress: No Stress Concern Present (03/27/2022)   [employer] of Occupational Health - Occupational Stress Questionnaire    Feeling of Stress : Only a little  Social Connections: Socially Isolated (03/27/2022)   Social Connection and Isolation Panel [NHANES]    Frequency of Communication with Friends and Family: Once a week    Frequency of Social Gatherings with Friends and Family: Once a week    Attends Religious Services: Never    Database administrator or Organizations: No    Attends Engineer, structural: Never    Marital Status: Married    Family History  Problem Relation Age of Onset   Melanoma Father    Heart disease Father    Healthy Mother    Colon cancer Neg Hx    Esophageal cancer Neg Hx     Health Maintenance  Topic Date Due   DTaP/Tdap/Td (1 - Tdap) Never done   Medicare Annual Wellness (AWV)  09/01/2023 (Originally 03/28/2023)   COVID-19 Vaccine (4 - 2023-24 season) 02/27/2024 (Originally 11/17/2022)   INFLUENZA VACCINE  03/22/2024 (Originally 07/24/2023)   Zoster Vaccines- Shingrix (1 of 2) 05/11/2024 (Originally 11/17/1974)   Pneumonia Vaccine 2+ Years old (1 of 1 - PCV) 07/31/2024 (Originally 11/17/2020)   Colonoscopy  05/24/2030   Hepatitis C Screening  Completed   HPV VACCINES  Aged Out      ----------------------------------------------------------------------------------------------------------------------------------------------------------------------------------------------------------------- Physical Exam BP 127/86 (BP Location: Left Arm, Patient Position: Sitting, Cuff Size: Normal)   Pulse (!) 109   Ht 5\' 9"  (1.753 m)   Wt 188 lb (85.3 kg)   SpO2 98%   BMI 27.76 kg/m   Physical Exam Constitutional:      Appearance: He is well-developed.  Eyes:     General: No scleral icterus. Cardiovascular:     Rate and Rhythm: Normal rate and regular rhythm.  Pulmonary:     Effort: Pulmonary effort is normal.     Breath sounds: Normal breath sounds.  Musculoskeletal:     Cervical back: Neck supple.  Skin:    Comments: He has some scattered raised patches on the legs and trunk.  Eczematous in appearance.   Neurological:     Mental Status: He is alert.  Psychiatric:        Mood and Affect: Mood normal.        Behavior: Behavior normal.     ------------------------------------------------------------------------------------------------------------------------------------------------------------------------------------------------------------------- Assessment and Plan  Essential hypertension BP remains well controlled.  We'll plan to continue current antihypertensive regimen.   Rash Unclear etiology. Would favor this to be eczematous in nature.  Possible extraintestinal manifestation of his Crohns but this seems well controlled.  Adding steroid taper.  May continue triamcinolone as needed as well.  Continue antihistamine.   Body aches Really started after yellow jacket sting, but not sure that this is related to the sting itself.  He does have knee effusion and hs f/u with ortho soon.  Checking ESR and CRP with history of IBD as well.     Meds ordered this encounter  Medications   predniSONE (STERAPRED UNI-PAK 21 TAB) 10 MG (21) TBPK tablet    Sig: Taper as  directed on packaging.    Dispense:  21 tablet    Refill:  0    No follow-ups on file.    This visit occurred during the SARS-CoV-2 public health emergency.  Safety protocols were in place, including screening questions prior to the visit, additional usage of staff PPE, and extensive cleaning of exam room while observing appropriate contact time as indicated for disinfecting solutions.

## 2023-08-01 NOTE — Assessment & Plan Note (Signed)
Really started after yellow jacket sting, but not sure that this is related to the sting itself.  He does have knee effusion and hs f/u with ortho soon.  Checking ESR and CRP with history of IBD as well.

## 2023-08-04 ENCOUNTER — Encounter: Payer: Self-pay | Admitting: Family Medicine

## 2023-08-05 DIAGNOSIS — M25469 Effusion, unspecified knee: Secondary | ICD-10-CM | POA: Diagnosis not present

## 2023-08-05 DIAGNOSIS — Z96651 Presence of right artificial knee joint: Secondary | ICD-10-CM | POA: Diagnosis not present

## 2023-08-12 ENCOUNTER — Ambulatory Visit (INDEPENDENT_AMBULATORY_CARE_PROVIDER_SITE_OTHER): Payer: Medicare HMO | Admitting: Family Medicine

## 2023-08-12 DIAGNOSIS — Z0000 Encounter for general adult medical examination without abnormal findings: Secondary | ICD-10-CM

## 2023-08-12 NOTE — Patient Instructions (Addendum)
MEDICARE ANNUAL WELLNESS VISIT Health Maintenance Summary and Written Plan of Care  [name] ,  Thank you for allowing me to perform your Medicare Annual Wellness Visit and for your ongoing commitment to your health.   Health Maintenance & Immunization History Health Maintenance  Topic Date Due   DTaP/Tdap/Td (1 - Tdap) Never done   COVID-19 Vaccine (4 - 2023-24 season) 02/27/2024 (Originally 11/17/2022)   INFLUENZA VACCINE  03/22/2024 (Originally 07/24/2023)   Zoster Vaccines- Shingrix (1 of 2) 05/11/2024 (Originally 11/17/1974)   Pneumonia Vaccine 46+ Years old (1 of 1 - PCV) 07/31/2024 (Originally 11/17/2020)   Medicare Annual Wellness (AWV)  08/11/2024   Colonoscopy  05/24/2030   Hepatitis C Screening  Completed   HPV VACCINES  Aged Out   Immunization History  Administered Date(s) Administered   Hepatitis A, Adult 01/24/2022, 07/29/2022   Hepb-cpg 01/24/2022, 02/21/2022   Influenza Whole 10/07/2008   Influenza, High Dose Seasonal PF 10/15/2021   Influenza,inj,Quad PF,6+ Mos 10/21/2018, 09/02/2019, 11/08/2020   Influenza-Unspecified 09/22/2022   PFIZER(Purple Top)SARS-COV-2 Vaccination 04/01/2020, 04/24/2020, 09/22/2022    These are the patient goals that we discussed:  Goals Addressed               This Visit's Progress     Patient Stated (pt-stated)        Patient stated that he would like to be more active than last year.         This is a list of Health Maintenance Items that are overdue or due now: Health Maintenance Due  Topic Date Due   DTaP/Tdap/Td (1 - Tdap) Never done  Pneumococcal vaccine  Influenza vaccine Td vaccine Shingles vaccine   Orders/Referrals Placed Today: No orders of the defined types were placed in this encounter.  (Contact our referral department at [phone] if you have not spoken with someone about your referral appointment within the next 5 days)    Follow-up Plan Follow-up with Everrett Coombe, DO as planned Schedule  t dap and shingles vaccine at the pharmacy. Discuss pneumonia vaccine with PCP.  Schedule influenza vaccine at the pharmacy or the office. Medicare wellness visit in one year.  Patient will access AVS on my chart.      Health Maintenance, Male Adopting a healthy lifestyle and getting preventive care are important in promoting health and wellness. Ask your health care provider about: The right schedule for you to have regular tests and exams. Things you can do on your own to prevent diseases and keep yourself healthy. What should I know about diet, weight, and exercise? Eat a healthy diet  Eat a diet that includes plenty of vegetables, fruits, low-fat dairy products, and lean protein. Do not eat a lot of foods that are high in solid fats, added sugars, or sodium. Maintain a healthy weight Body mass index (BMI) is a measurement that can be used to identify possible weight problems. It estimates body fat based on height and weight. Your health care provider can help determine your BMI and help you achieve or maintain a healthy weight. Get regular exercise Get regular exercise. This is one of the most important things you can do for your health. Most adults should: Exercise for at least 150 minutes each week. The exercise should increase your heart rate and make you sweat (moderate-intensity exercise). Do strengthening exercises at least twice a week. This is in addition to the moderate-intensity exercise. Spend less time sitting. Even light physical activity can be beneficial. Watch cholesterol and blood  lipids Have your blood tested for lipids and cholesterol at 68 years of age, then have this test every 5 years. You may need to have your cholesterol levels checked more often if: Your lipid or cholesterol levels are high. You are older than 68 years of age. You are at high risk for heart disease. What should I know about cancer screening? Many types of cancers can be detected early and  may often be prevented. Depending on your health history and family history, you may need to have cancer screening at various ages. This may include screening for: Colorectal cancer. Prostate cancer. Skin cancer. Lung cancer. What should I know about heart disease, diabetes, and high blood pressure? Blood pressure and heart disease High blood pressure causes heart disease and increases the risk of stroke. This is more likely to develop in people who have high blood pressure readings or are overweight. Talk with your health care provider about your target blood pressure readings. Have your blood pressure checked: Every 3-5 years if you are 70-41 years of age. Every year if you are 7 years old or older. If you are between the ages of 76 and 10 and are a current or former smoker, ask your health care provider if you should have a one-time screening for abdominal aortic aneurysm (AAA). Diabetes Have regular diabetes screenings. This checks your fasting blood sugar level. Have the screening done: Once every three years after age 31 if you are at a normal weight and have a low risk for diabetes. More often and at a younger age if you are overweight or have a high risk for diabetes. What should I know about preventing infection? Hepatitis B If you have a higher risk for hepatitis B, you should be screened for this virus. Talk with your health care provider to find out if you are at risk for hepatitis B infection. Hepatitis C Blood testing is recommended for: Everyone born from [birth year] through 1966. Anyone with known risk factors for hepatitis C. Sexually transmitted infections (STIs) You should be screened each year for STIs, including gonorrhea and chlamydia, if: You are sexually active and are younger than 68 years of age. You are older than 68 years of age and your health care provider tells you that you are at risk for this type of infection. Your sexual activity has changed since you were  last screened, and you are at increased risk for chlamydia or gonorrhea. Ask your health care provider if you are at risk. Ask your health care provider about whether you are at high risk for HIV. Your health care provider may recommend a prescription medicine to help prevent HIV infection. If you choose to take medicine to prevent HIV, you should first get tested for HIV. You should then be tested every 3 months for as long as you are taking the medicine. Follow these instructions at home: Alcohol use Do not drink alcohol if your health care provider tells you not to drink. If you drink alcohol: Limit how much you have to 0-2 drinks a day. Know how much alcohol is in your drink. In the [place] one drink equals one 12 oz bottle of beer (355 mL), one 5 oz glass of wine (148 mL), or one 1 oz glass of hard liquor (44 mL). Lifestyle Do not use any products that contain nicotine or tobacco. These products include cigarettes, chewing tobacco, and vaping devices, such as e-cigarettes. If you need help quitting, ask your health care provider. Do not  use street drugs. Do not share needles. Ask your health care provider for help if you need support or information about quitting drugs. General instructions Schedule regular health, dental, and eye exams. Stay current with your vaccines. Tell your health care provider if: You often feel depressed. You have ever been abused or do not feel safe at home. Summary Adopting a healthy lifestyle and getting preventive care are important in promoting health and wellness. Follow your health care provider's instructions about healthy diet, exercising, and getting tested or screened for diseases. Follow your health care provider's instructions on monitoring your cholesterol and blood pressure. This information is not intended to replace advice given to you by your health care provider. Make sure you discuss any questions you have with your health care  provider. Document Revised: 04/30/2021 Document Reviewed: 04/30/2021 Elsevier Patient Education  2024 [employer].

## 2023-08-12 NOTE — Progress Notes (Signed)
MEDICARE ANNUAL WELLNESS VISIT  08/12/2023  Telephone Visit Disclaimer This Medicare AWV was conducted by telephone due to national recommendations for restrictions regarding the COVID-19 Pandemic (e.g. social distancing).  I verified, using two identifiers, that I am speaking with Tyler Gates or their authorized healthcare agent. I discussed the limitations, risks, security, and privacy concerns of performing an evaluation and management service by telephone and the potential availability of an in-person appointment in the future. The patient expressed understanding and agreed to proceed.  Location of Patient: Home Location of Provider (nurse):  In the office.  Subjective:    Tyler Gates is a 67 y.o. male patient of Everrett Coombe, DO who had a Medicare Annual Wellness Visit today via telephone. Jaydis is Retired and lives with their spouse. he has 2 children. he reports that he is socially active and does interact with friends/family regularly. he is moderately physically active and enjoys cycling, kayaking and working on model cars.  Patient Care Team: Everrett Coombe, DO as PCP - General (Family Medicine) Rennis Golden Lisette Abu, MD as PCP - Cardiology (Cardiology) Corbin Ade, MD as Attending Physician (Gastroenterology)     08/12/2023    8:09 AM 04/28/2023    9:34 AM 04/15/2023    8:42 AM 03/27/2022   10:04 AM 10/21/2018   11:40 AM 07/11/2017    3:49 PM 07/11/2017    2:07 PM  Advanced Directives  Does Patient Have a Medical Advance Directive? Yes Yes Yes Yes No No No  Type of Advance Directive Living will Healthcare Power of [place];Living will Healthcare Power of [place];Living will Living will     Does patient want to make changes to medical advance directive? No - Patient declined   No - Patient declined     Copy of Healthcare Power of Attorney in Chart?  Yes - validated most recent copy scanned in chart (See row information)       Would patient like information on creating  a medical advance directive?     No - Patient declined No - Patient declined     Hospital Utilization Over the Past 12 Months: # of hospitalizations or ER visits: 0 # of surgeries: 1  Review of Systems    Patient reports that his overall health is better compared to last year.  History obtained from chart review and the patient  Patient Reported Readings (BP, Pulse, CBG, Weight, etc)  Per patient no change in vitals since last visit, unable to obtain new vitals due to telehealth visit.   Pain Assessment Pain : No/denies pain     Current Medications & Allergies (verified) Allergies as of 08/12/2023       Reactions   Sulfasalazine Other (See Comments)   Flu like feelings   Statins Other (See Comments)   REACTION: Myalgia        Medication List        Accurate as of August 12, 2023  8:31 AM. If you have any questions, ask your nurse or doctor.          STOP taking these medications    predniSONE 10 MG (21) Tbpk tablet Commonly known as: STERAPRED UNI-PAK 21 TAB       TAKE these medications    amLODipine 5 MG tablet Commonly known as: NORVASC TAKE 2 TABLETS BY MOUTH EVERY DAY What changed: how much to take   ezetimibe 10 MG tablet Commonly known as: ZETIA TAKE 1 TABLET BY MOUTH EVERY DAY  loperamide 2 MG capsule Commonly known as: IMODIUM Take 2 mg by mouth as needed for diarrhea or loose stools.   meloxicam 15 MG tablet Commonly known as: MOBIC Take 15 mg by mouth daily.   olmesartan 20 MG tablet Commonly known as: BENICAR Take 1 tablet (20 mg total) by mouth daily.   omeprazole 40 MG capsule Commonly known as: PRILOSEC TAKE 1 CAPSULE (40 MG TOTAL) BY MOUTH IN THE MORNING AND AT BEDTIME.   Remicade 100 MG injection Generic drug: inFLIXimab SMARTSIG:400 Milligram(s) IV Every 4 Weeks   rosuvastatin 20 MG tablet Commonly known as: CRESTOR TAKE 1 TABLET BY MOUTH EVERY DAY   triamcinolone ointment 0.1 % Commonly known as: KENALOG Apply 1  Application topically 2 (two) times daily.   venlafaxine 37.5 MG tablet Commonly known as: EFFEXOR TAKE 1 TABLET BY MOUTH TWICE A DAY   VITAMIN D3 PO Take 1 tablet by mouth daily.        History (reviewed): Past Medical History:  Diagnosis Date   Arthritis    Crohn's disease (HCC)    Depression    Dyspnea on exertion    GERD (gastroesophageal reflux disease)    Herpes    History of kidney stones    Hyperlipidemia    Hypertension    Instability of knee joint    Left displaced femoral neck fracture (HCC) 07/11/2017   Snoring    Past Surgical History:  Procedure Laterality Date   COLONOSCOPY  2007   Dr. Ozzie Hoyle normal TI, ascending colon/cecum with mild inflammation noted but biopses without signs of active inflammation, hyperpastic polyp, possible ischemia on path of left colon biopsy   COLONOSCOPY N/A 11/08/2015   Procedure: COLONOSCOPY;  Surgeon: Corbin Ade, MD;  [facility];  Service: Endoscopy;  Laterality: N/A;   ESOPHAGOGASTRODUODENOSCOPY  2007   Dr. Ozzie Hoyle reflux esophagitis, hiatal hernia, negative H.pylori   ESOPHAGOGASTRODUODENOSCOPY N/A 11/08/2015   Procedure: ESOPHAGOGASTRODUODENOSCOPY (EGD);  Surgeon: Corbin Ade, MD;  [facility];  Service: Endoscopy;  Laterality: N/A;  0830 - moved to 8:15 - office to notify   HIP PINNING,CANNULATED Left 07/12/2017   Procedure: CANNULATED HIP PINNING LEFT;  Surgeon: Samson Frederic, MD;  Location: [facility];  Service: Orthopedics;  Laterality: Left;   KNEE ARTHROPLASTY Right 04/23/2023   Procedure: COMPUTER ASSISTED TOTAL KNEE ARTHROPLASTY;  Surgeon: Samson Frederic, MD;  Location: [facility];  Service: Orthopedics;  Laterality: Right;  160   Family History  Problem Relation Age of Onset   Melanoma Father    Heart disease Father    Healthy Mother    Colon cancer Neg Hx    Esophageal cancer Neg Hx    Social History   Socioeconomic History   Marital status: Married    Spouse name: Ismaeel Birchard   Number  of children: 2   Years of education: 14   Highest education level: Some college, no degree  Occupational History   Occupation: Retired  Tobacco Use   Smoking status: Never   Smokeless tobacco: Never  Vaping Use   Vaping status: Never Used  Substance and Sexual Activity   Alcohol use: Not Currently   Drug use: No   Sexual activity: Not Currently    Birth control/protection: None  Other Topics Concern   Not on file  Social History Narrative   Lives with wife. He has two children. He enjoys cycling, kayaking and working on model cars.   Social Determinants of Corporate investment banker  Strain: Low Risk  (08/12/2023)   Overall Financial Resource Strain (CARDIA)    Difficulty of Paying Living Expenses: Not hard at all  Food Insecurity: No Food Insecurity (08/12/2023)   Hunger Vital Sign    Worried About Running Out of Food in the Last Year: Never true    Ran Out of Food in the Last Year: Never true  Transportation Needs: No Transportation Needs (08/12/2023)   PRAPARE - Administrator, Civil Service (Medical): No    Lack of Transportation (Non-Medical): No  Physical Activity: Sufficiently Active (08/12/2023)   Exercise Vital Sign    Days of Exercise per Week: 3 days    Minutes of Exercise per Session: 60 min  Stress: No Stress Concern Present (08/12/2023)   [employer] of Occupational Health - Occupational Stress Questionnaire    Feeling of Stress : Not at all  Social Connections: Moderately Isolated (08/12/2023)   Social Connection and Isolation Panel [NHANES]    Frequency of Communication with Friends and Family: Three times a week    Frequency of Social Gatherings with Friends and Family: Once a week    Attends Religious Services: Never    Database administrator or Organizations: No    Attends Banker Meetings: Never    Marital Status: Married    Activities of Daily Living    08/12/2023    8:15 AM 04/15/2023    8:44 AM  In your present state  of health, do you have any difficulty performing the following activities:  Hearing? 1   Comment some hearing loss   Vision? 1   Comment macular degeneration in left eye   Difficulty concentrating or making decisions? 0   Walking or climbing stairs? 0   Dressing or bathing? 0   Doing errands, shopping? 0 0  Preparing Food and eating ? N   Using the Toilet? N   In the past six months, have you accidently leaked urine? N   Do you have problems with loss of bowel control? N   Managing your Medications? N   Managing your Finances? N   Housekeeping or managing your Housekeeping? N     Patient Education/ Literacy How often do you need to have someone help you when you read instructions, pamphlets, or other written materials from your doctor or pharmacy?: 1 - Never What is the last grade level you completed in school?: 12th grade  Exercise    Diet Patient reports consuming 3 meals a day and 2 snack(s) a day Patient reports that his primary diet is: Regular Patient reports that she does have regular access to food.   Depression Screen    08/12/2023    8:11 AM 08/01/2023    8:36 AM 02/11/2023    3:05 PM 03/27/2022   10:04 AM 01/29/2022   11:06 AM 04/10/2021   10:23 AM 04/10/2021   10:08 AM  PHQ 2/9 Scores  PHQ - 2 Score 0 0 0 0 0 0 0  PHQ- 9 Score  0 0 0 0       Fall Risk    08/12/2023    8:11 AM 08/01/2023    8:35 AM 02/11/2023    3:05 PM 03/27/2022   10:04 AM 01/29/2022   11:05 AM  Fall Risk   Falls in the past year? 0 0 0 0 0  Number falls in past yr: 0 0 0 0 0  Injury with Fall? 0 0 0 0 0  Risk for  fall due to : No Fall Risks No Fall Risks No Fall Risks No Fall Risks No Fall Risks  Follow up Falls evaluation completed Falls evaluation completed Falls evaluation completed Falls evaluation completed Falls evaluation completed     Objective:  Tyler Gates seemed alert and oriented and he participated appropriately during our telephone visit.  Blood Pressure Weight BMI  BP  Readings from Last 3 Encounters:  08/01/23 127/86  07/23/23 136/78  07/08/23 (!) 139/92   Wt Readings from Last 3 Encounters:  08/01/23 188 lb (85.3 kg)  07/23/23 187 lb (84.8 kg)  05/28/23 187 lb (84.8 kg)   BMI Readings from Last 1 Encounters:  08/01/23 27.76 kg/m    *Unable to obtain current vital signs, weight, and BMI due to telephone visit type  Hearing/Vision  Keontay did not seem to have difficulty with hearing/understanding during the telephone conversation Reports that he has had a formal eye exam by an eye care professional within the past year Reports that he has had a formal hearing evaluation within the past year *Unable to fully assess hearing and vision during telephone visit type  Cognitive Function:    08/12/2023    8:17 AM 03/27/2022   10:13 AM  6CIT Screen  What Year? 0 points 0 points  What month? 0 points 0 points  What time? 0 points 0 points  Count back from 20 0 points 0 points  Months in reverse 0 points 0 points  Repeat phrase 2 points 2 points  Total Score 2 points 2 points   (Normal:0-7, Significant for Dysfunction: >8)  Normal Cognitive Function Screening: Yes   Immunization & Health Maintenance Record Immunization History  Administered Date(s) Administered   Hepatitis A, Adult 01/24/2022, 07/29/2022   Hepb-cpg 01/24/2022, 02/21/2022   Influenza Whole 10/07/2008   Influenza, High Dose Seasonal PF 10/15/2021   Influenza,inj,Quad PF,6+ Mos 10/21/2018, 09/02/2019, 11/08/2020   Influenza-Unspecified 09/22/2022   PFIZER(Purple Top)SARS-COV-2 Vaccination 04/01/2020, 04/24/2020, 09/22/2022    Health Maintenance  Topic Date Due   DTaP/Tdap/Td (1 - Tdap) Never done   COVID-19 Vaccine (4 - 2023-24 season) 02/27/2024 (Originally 11/17/2022)   INFLUENZA VACCINE  03/22/2024 (Originally 07/24/2023)   Zoster Vaccines- Shingrix (1 of 2) 05/11/2024 (Originally 11/17/1974)   Pneumonia Vaccine 51+ Years old (1 of 1 - PCV) 07/31/2024 (Originally  11/17/2020)   Medicare Annual Wellness (AWV)  08/11/2024   Colonoscopy  05/24/2030   Hepatitis C Screening  Completed   HPV VACCINES  Aged Out       Assessment  This is a routine wellness examination for [employer].  Health Maintenance: Due or Overdue Health Maintenance Due  Topic Date Due   DTaP/Tdap/Td (1 - Tdap) Never done    Tyler Gates does not need a referral for Community Assistance: Care Management:   no Social Work:    no Prescription Assistance:  no Nutrition/Diabetes Education:  no   Plan:  Personalized Goals  Goals Addressed               This Visit's Progress     Patient Stated (pt-stated)        Patient stated that he would like to be more active than last year.       Personalized Health Maintenance & Screening Recommendations  Pneumococcal vaccine  Influenza vaccine Td vaccine Shingles vaccine  Lung Cancer Screening Recommended: no (Low Dose CT Chest recommended if Age 61-80 years, 20 pack-year currently smoking OR have quit w/in past 15 years)  Hepatitis C Screening recommended: no HIV Screening recommended: no  Advanced Directives: Written information was not prepared per patient's request.  Referrals & Orders No orders of the defined types were placed in this encounter.   Follow-up Plan Follow-up with Everrett Coombe, DO as planned Schedule t dap and shingles vaccine at the pharmacy. Discuss pneumonia vaccine with PCP.  Schedule influenza vaccine at the pharmacy or the office. Medicare wellness visit in one year.  Patient will access AVS on my chart.   I have personally reviewed and noted the following in the patient's chart:   Medical and social history Use of alcohol, tobacco or illicit drugs  Current medications and supplements Functional ability and status Nutritional status Physical activity Advanced directives List of other physicians Hospitalizations, surgeries, and ER visits in previous 12  months Vitals Screenings to include cognitive, depression, and falls Referrals and appointments  In addition, I have reviewed and discussed with Tyler Gates certain preventive protocols, quality metrics, and best practice recommendations. A written personalized care plan for preventive services as well as general preventive health recommendations is available and can be mailed to the patient at his request.      Modesto Charon  08/12/2023

## 2023-08-13 ENCOUNTER — Other Ambulatory Visit: Payer: Self-pay | Admitting: Cardiology

## 2023-08-22 DIAGNOSIS — Z96651 Presence of right artificial knee joint: Secondary | ICD-10-CM | POA: Diagnosis not present

## 2023-08-22 DIAGNOSIS — Z471 Aftercare following joint replacement surgery: Secondary | ICD-10-CM | POA: Diagnosis not present

## 2023-08-22 NOTE — Telephone Encounter (Signed)
Spoke with patient. States that he was given an abx by surgeon who dx the staph  infection in his knee - he will start this abx and will discuss further with Dr. Ashley Royalty at this appointment on Wednesday 08/27/23.

## 2023-08-26 ENCOUNTER — Telehealth: Payer: Self-pay

## 2023-08-26 NOTE — Telephone Encounter (Signed)
-----   Message from [facility] Afia Messenger B sent at 04/16/2023  3:32 PM EDT ----- Regarding: remicade lab due Patient is due for lab in 5 months - 09/04/23

## 2023-08-26 NOTE — Telephone Encounter (Signed)
Called patient to let him know that he is due for remicade lab per Dr. Barron Alvine last OV notes on 4/24. Advised patient to have blood drawn 1-2 weeks prior to remicade infusion. Patient is scheduled for infusion on 9/25. Lab is already ordered in [facility]. Routine follow up appointment with Dr. Barron Alvine is also scheduled on 11/28/23 at 11:00 AM. Patient verbalized understanding.

## 2023-08-27 ENCOUNTER — Encounter: Payer: Self-pay | Admitting: Family Medicine

## 2023-08-27 ENCOUNTER — Ambulatory Visit (INDEPENDENT_AMBULATORY_CARE_PROVIDER_SITE_OTHER): Payer: Medicare HMO | Admitting: Family Medicine

## 2023-08-27 VITALS — BP 134/83 | HR 97 | Ht 69.0 in | Wt 187.0 lb

## 2023-08-27 DIAGNOSIS — T8453XA Infection and inflammatory reaction due to internal right knee prosthesis, initial encounter: Secondary | ICD-10-CM | POA: Insufficient documentation

## 2023-08-27 DIAGNOSIS — Z96659 Presence of unspecified artificial knee joint: Secondary | ICD-10-CM | POA: Diagnosis not present

## 2023-08-27 DIAGNOSIS — T8459XA Infection and inflammatory reaction due to other internal joint prosthesis, initial encounter: Secondary | ICD-10-CM | POA: Diagnosis not present

## 2023-08-27 NOTE — Progress Notes (Signed)
Tyler Gates - 67 y.o. male MRN [id number]  Date of birth: [birth year]  Subjective Chief Complaint  Patient presents with   Results    HPI Tyler Gates is a 67 year old male here today for follow-up of knee pain.  He was last seen by me a few weeks ago.  He was having some increased knee pain as well as rash and bodyaches that started after yellowjacket sting.  He did have improvement of his rash as well as his knee pain eliquis and steroids------.  He did see his orthopedist as well who withdrew fluid from the knee.  This came back positive for Staph epidermidis.  This was resistant to tetracyclines and clindamycin.  Sensitive to oxacillin.  His CRP and ESR were elevated.  Presumed infection of artificial joint.  Reports he was initially given Levaquin however did not start this after reading side effects profile.  He was started on Keflex.  He was told that he will likely need to have surgery again for possible replacement.  He would like to try to put this off if possible.  He has not had fever or chills.  He does continue on Remicade  ROS:  A comprehensive ROS was completed and negative except as noted per HPI   Allergies  Allergen Reactions   Sulfasalazine Other (See Comments)    Flu like feelings   Statins Other (See Comments)    REACTION: Myalgia    Past Medical History:  Diagnosis Date   Arthritis    Crohn's disease (HCC)    Depression    Dyspnea on exertion    GERD (gastroesophageal reflux disease)    Herpes    History of kidney stones    Hyperlipidemia    Hypertension    Instability of knee joint    Left displaced femoral neck fracture (HCC) 07/11/2017   Snoring     Past Surgical History:  Procedure Laterality Date   COLONOSCOPY  2007   Dr. Ozzie Hoyle normal TI, ascending colon/cecum with mild inflammation noted but biopses without signs of active inflammation, hyperpastic polyp, possible ischemia on path of left colon biopsy   COLONOSCOPY N/A 11/08/2015   Procedure:  COLONOSCOPY;  Surgeon: Corbin Ade, MD;  [facility];  Service: Endoscopy;  Laterality: N/A;   ESOPHAGOGASTRODUODENOSCOPY  2007   Dr. Ozzie Hoyle reflux esophagitis, hiatal hernia, negative H.pylori   ESOPHAGOGASTRODUODENOSCOPY N/A 11/08/2015   Procedure: ESOPHAGOGASTRODUODENOSCOPY (EGD);  Surgeon: Corbin Ade, MD;  [facility];  Service: Endoscopy;  Laterality: N/A;  0830 - moved to 8:15 - office to notify   HIP PINNING,CANNULATED Left 07/12/2017   Procedure: CANNULATED HIP PINNING LEFT;  Surgeon: Samson Frederic, MD;  Location: [facility];  Service: Orthopedics;  Laterality: Left;   KNEE ARTHROPLASTY Right 04/23/2023   Procedure: COMPUTER ASSISTED TOTAL KNEE ARTHROPLASTY;  Surgeon: Samson Frederic, MD;  Location: [facility];  Service: Orthopedics;  Laterality: Right;  160    Social History   Socioeconomic History   Marital status: Married    Spouse name: Raymir Bendik   Number of children: 2   Years of education: 14   Highest education level: Some college, no degree  Occupational History   Occupation: Retired  Tobacco Use   Smoking status: Never   Smokeless tobacco: Never  Vaping Use   Vaping status: Never Used  Substance and Sexual Activity   Alcohol use: Not Currently   Drug use: No   Sexual activity: Not Currently    Birth  control/protection: None  Other Topics Concern   Not on file  Social History Narrative   Lives with wife. He has two children. He enjoys cycling, kayaking and working on model cars.   Social Determinants of Health   Financial Resource Strain: Low Risk  (08/12/2023)   Overall Financial Resource Strain (CARDIA)    Difficulty of Paying Living Expenses: Not hard at all  Food Insecurity: No Food Insecurity (08/12/2023)   Hunger Vital Sign    Worried About Running Out of Food in the Last Year: Never true    Ran Out of Food in the Last Year: Never true  Transportation Needs: No Transportation Needs (08/12/2023)   PRAPARE - Doctor, general practice (Medical): No    Lack of Transportation (Non-Medical): No  Physical Activity: Sufficiently Active (08/12/2023)   Exercise Vital Sign    Days of Exercise per Week: 3 days    Minutes of Exercise per Session: 60 min  Stress: No Stress Concern Present (08/12/2023)   [employer] of Occupational Health - Occupational Stress Questionnaire    Feeling of Stress : Not at all  Social Connections: Moderately Isolated (08/12/2023)   Social Connection and Isolation Panel [NHANES]    Frequency of Communication with Friends and Family: Three times a week    Frequency of Social Gatherings with Friends and Family: Once a week    Attends Religious Services: Never    Database administrator or Organizations: No    Attends Engineer, structural: Never    Marital Status: Married    Family History  Problem Relation Age of Onset   Melanoma Father    Heart disease Father    Healthy Mother    Colon cancer Neg Hx    Esophageal cancer Neg Hx     Health Maintenance  Topic Date Due   DTaP/Tdap/Td (1 - Tdap) Never done   COVID-19 Vaccine (4 - 2023-24 season) 11/12/2023 (Originally 08/24/2023)   INFLUENZA VACCINE  03/22/2024 (Originally 07/24/2023)   Zoster Vaccines- Shingrix (1 of 2) 05/11/2024 (Originally 11/17/1974)   Pneumonia Vaccine 65+ Years old (1 of 1 - PCV) 07/31/2024 (Originally 11/17/2020)   Medicare Annual Wellness (AWV)  08/11/2024   Colonoscopy  05/24/2030   Hepatitis C Screening  Completed   HPV VACCINES  Aged Out     ----------------------------------------------------------------------------------------------------------------------------------------------------------------------------------------------------------------- Physical Exam BP 134/83 (BP Location: Left Arm, Patient Position: Sitting, Cuff Size: Normal)   Pulse 97   Ht 5\' 9"  (1.753 m)   Wt 187 lb (84.8 kg)   SpO2 96%   BMI 27.62 kg/m   Physical Exam Constitutional:      Appearance: Normal  appearance.  HENT:     Head: Normocephalic and atraumatic.  Eyes:     General: No scleral icterus. Cardiovascular:     Rate and Rhythm: Normal rate and regular rhythm.  Pulmonary:     Effort: Pulmonary effort is normal.     Breath sounds: Normal breath sounds.  Musculoskeletal:     Cervical back: Neck supple.  Neurological:     General: No focal deficit present.     Mental Status: He is alert.  Psychiatric:        Mood and Affect: Mood normal.        Behavior: Behavior normal.     ------------------------------------------------------------------------------------------------------------------------------------------------------------------------------------------------------------------- Assessment and Plan  Infected prosthetic knee joint (HCC) Currently on cephalexin.  Denies systemic symptoms at this time.  He is trying to push out the replacement of  the joint until later in the winter.  Given his immunocompromise state from Remicade as well as presence of prosthesis I discussed with him that I would like to get infectious disease opinion on long-term antibiotics.  Also will need guidance from surgeon regarding when and if he needs replacement of prosthesis.   No orders of the defined types were placed in this encounter.   No follow-ups on file.    This visit occurred during the SARS-CoV-2 public health emergency.  Safety protocols were in place, including screening questions prior to the visit, additional usage of staff PPE, and extensive cleaning of exam room while observing appropriate contact time as indicated for disinfecting solutions.

## 2023-08-27 NOTE — Assessment & Plan Note (Signed)
Currently on cephalexin.  Denies systemic symptoms at this time.  He is trying to push out the replacement of the joint until later in the winter.  Given his immunocompromise state from Remicade as well as presence of prosthesis I discussed with him that I would like to get infectious disease opinion on long-term antibiotics.  Also will need guidance from surgeon regarding when and if he needs replacement of prosthesis.

## 2023-08-28 ENCOUNTER — Encounter: Payer: Self-pay | Admitting: Gastroenterology

## 2023-08-28 NOTE — Telephone Encounter (Signed)
Dr C- Patient has been diagnosed with infection of prosthetic knee joint and expects that he will need possible replacement of the prosthesis in the near future. Is requesting advice on what to do with remicade infusions at this time. Last infusion was 07/23/23. Next scheduled infusion 09/17/23.  Excerpt from Dr Everrett Coombe' note from yesterday:  Infected prosthetic knee joint (HCC) Currently on cephalexin.  Denies systemic symptoms at this time.  He is trying to push out the replacement of the joint until later in the winter.  Given his immunocompromise state from Remicade as well as presence of prosthesis I discussed with him that I would like to get infectious disease opinion on long-term antibiotics.  Also will need guidance from surgeon regarding when and if he needs replacement of prosthesis.    Please advise.Marland KitchenMarland KitchenMarland Kitchen

## 2023-08-31 ENCOUNTER — Encounter: Payer: Self-pay | Admitting: Family Medicine

## 2023-09-02 MED ORDER — TRAMADOL HCL 50 MG PO TABS
50.0000 mg | ORAL_TABLET | Freq: Three times a day (TID) | ORAL | 0 refills | Status: AC | PRN
Start: 2023-09-02 — End: 2023-09-07

## 2023-09-03 ENCOUNTER — Other Ambulatory Visit: Payer: Medicare HMO

## 2023-09-03 DIAGNOSIS — K50119 Crohn's disease of large intestine with unspecified complications: Secondary | ICD-10-CM

## 2023-09-10 ENCOUNTER — Other Ambulatory Visit: Payer: Self-pay

## 2023-09-10 ENCOUNTER — Ambulatory Visit: Payer: Self-pay | Admitting: Student

## 2023-09-10 ENCOUNTER — Encounter: Payer: Self-pay | Admitting: Internal Medicine

## 2023-09-10 ENCOUNTER — Ambulatory Visit: Payer: Medicare HMO | Admitting: Internal Medicine

## 2023-09-10 VITALS — BP 131/79 | HR 83 | Resp 16 | Ht 69.0 in | Wt 188.4 lb

## 2023-09-10 DIAGNOSIS — T8459XA Infection and inflammatory reaction due to other internal joint prosthesis, initial encounter: Secondary | ICD-10-CM | POA: Diagnosis not present

## 2023-09-10 DIAGNOSIS — Z96659 Presence of unspecified artificial knee joint: Secondary | ICD-10-CM

## 2023-09-10 MED ORDER — AMOXICILLIN 500 MG PO CAPS: 1000.0000 mg | ORAL_CAPSULE | Freq: Two times a day (BID) | ORAL | 0 refills | Status: DC

## 2023-09-10 NOTE — Progress Notes (Unsigned)
Patient: Tyler Gates  DOB: 1956/08/27 MRN: [id number] PCP: Shellia Cleverly, DO  Referring Provider: ***  Chief Complaint  Patient presents with   Establish Care     Patient Active Problem List   Diagnosis Date Noted   Infected prosthetic knee joint (HCC) 08/27/2023   Rash 08/01/2023   Body aches 08/01/2023   Osteoarthritis of right knee 04/23/2023   Preoperative clearance 02/11/2023   Chronic sinusitis 03/12/2022   Visual disturbance 03/12/2022   Acute sinusitis 01/29/2022   Chronic kidney disease (CKD) stage G2/A2, mildly decreased glomerular filtration rate (GFR) between 60-89 mL/min/1.73 square meter and albuminuria creatinine ratio between 30-299 mg/g 10/22/2018   Hypogonadism male 10/21/2018   Screening PSA (prostate specific antigen) 10/21/2018   Chronic kidney disease due to benign hypertension 10/21/2018   Depression, major, recurrent, in remission (HCC) 10/21/2018   Statin intolerance 09/14/2018   Agatston coronary artery calcium score greater than 400 08/10/2018   Family history of coronary artery disease 08/10/2018   Crohn's disease of colon without complication (HCC) 05/09/2017   Mucosal abnormality of esophagus    Hiatal hernia    KNEE JOINT INSTABILITY 03/07/2007   Hyperlipidemia 12/30/2006   Essential hypertension 12/30/2006   GERD 12/30/2006     Subjective:  Tyler Gates is a 67 y.o. male with past medical history as below including Crohn's disease on Remicade, arthritis, GERD, hyperlipidemia, hypertension, underwent primary right knee TKA on 04/23/2023 with Dr. Linna Caprice presents from PCP office for concern about infected right knee.  He underwent index case for right knee arthritis.Seen by Elmarie Shiley, DO on 9/4 who noted patient was having some increased knee pain as well as rash and bodyaches after yellowjacket sting.  Noted that orthopedist had withdrew fluid from the knee came back positive Staph epidermidis resistant to tetracycline  clindamycin sensitive to oxacillin.  CRP and ESR elevated.  Initially given Levaquin and then placed on Keflex.  He was told that he will likely need surgery to have possible replacement. Pt reprots he devloped a rash around incsion about a mmonth after surgery. The rash was not on incsion and wood ooze clear fluid. Was Rx ceflex about 2 months after srugery. He did not satrt taking it. He went urgent care and was Rx steroid cream which cleared up rash.  He had aspiration of knee on 8/13 with 37,260 wvc, 88% neutorphils, synavasure + staph Oct 0th surgery Cx + stpah epi S oxacillin.  ROS  Past Medical History:  Diagnosis Date   Arthritis    Crohn's disease (HCC)    Depression    Dyspnea on exertion    GERD (gastroesophageal reflux disease)    Herpes    History of kidney stones    Hyperlipidemia    Hypertension    Instability of knee joint    Left displaced femoral neck fracture (HCC) 07/11/2017   Snoring     Outpatient Medications Prior to Visit  Medication Sig Dispense Refill   amLODipine (NORVASC) 5 MG tablet TAKE 2 TABLETS BY MOUTH EVERY DAY (Patient taking differently: Take 7.5 mg by mouth daily.) 180 tablet 0   ezetimibe (ZETIA) 10 MG tablet TAKE 1 TABLET BY MOUTH EVERY DAY 90 tablet 0   levofloxacin (LEVAQUIN) 750 MG tablet Take 750 mg by mouth daily.     loperamide (IMODIUM) 2 MG capsule Take 2 mg by mouth as needed for diarrhea or loose stools.     meloxicam (MOBIC) 15 MG tablet Take 15 mg  by mouth daily.     olmesartan (BENICAR) 20 MG tablet TAKE 1 TABLET BY MOUTH EVERY DAY 90 tablet 0   omeprazole (PRILOSEC) 40 MG capsule TAKE 1 CAPSULE (40 MG TOTAL) BY MOUTH IN THE MORNING AND AT BEDTIME. 180 capsule 3   REMICADE 100 MG injection SMARTSIG:400 Milligram(s) IV Every 4 Weeks     rosuvastatin (CRESTOR) 20 MG tablet TAKE 1 TABLET BY MOUTH EVERY DAY 90 tablet 0   triamcinolone ointment (KENALOG) 0.1 % Apply 1 Application topically 2 (two) times daily. 45 g 0   venlafaxine  (EFFEXOR) 37.5 MG tablet TAKE 1 TABLET BY MOUTH TWICE A DAY 180 tablet 0   No facility-administered medications prior to visit.     Allergies  Allergen Reactions   Sulfasalazine Other (See Comments)    Flu like feelings   Statins Other (See Comments)    REACTION: Myalgia    Social History   Tobacco Use   Smoking status: Never   Smokeless tobacco: Never  Vaping Use   Vaping status: Never Used  Substance Use Topics   Alcohol use: Not Currently   Drug use: No    Family History  Problem Relation Age of Onset   Melanoma Father    Heart disease Father    Healthy Mother    Colon cancer Neg Hx    Esophageal cancer Neg Hx     Objective:  There were no vitals filed for this visit. There is no height or weight on file to calculate BMI.  Physical Exam  Lab Results: Lab Results  Component Value Date   WBC 10.3 08/01/2023   HGB 13.4 08/01/2023   HCT 41.1 08/01/2023   MCV 87 08/01/2023   PLT 399 08/01/2023    Lab Results  Component Value Date   CREATININE 1.39 (H) 08/01/2023   BUN 17 08/01/2023   NA 139 08/01/2023   K 4.4 08/01/2023   CL 101 08/01/2023   CO2 22 08/01/2023    Lab Results  Component Value Date   ALT 25 08/01/2023   AST 18 08/01/2023   ALKPHOS 84 08/01/2023   BILITOT 0.3 08/01/2023     Assessment & Plan:  #Concern for right knee PJI - Underwent index right knee TKA on 04/23/2023 with Dr. Linna Caprice for right knee arthritis. -F/U with ortho, oct 9th surgery planned for spacer placement. I supect he will need PICC line an d6 weeks of IV bx. Engage ID during hospitalizaiton. -Labs today -Amoxillin 1gm bid if knee shows signs of infection, today 09/10/23 -F/U with ID in 2 months #Crohn's on remicaide -Followed by GI -Spoke to pt that remicade likely needs to be heald during IV abx and 2 weeks out from completing abx therapy to monitro knee off of abx -He notes he will relay to GI   Danelle Earthly, MD [facility]   09/10/23  12:50 PM

## 2023-09-10 NOTE — Progress Notes (Signed)
HPI: FU CAD.  Nuclear study August 2019 showed ejection fraction 45% but visually appeared better. Perfusion was normal. Calcium score July 2019 410 which was 85th percentile for age and sex matched control. Echocardiogram May 2023 showed normal LV function, mild left ventricular hypertrophy, grade 1 diastolic dysfunction.  Cardiac CTA June 2023 showed calcium score 538 which was 82nd percentile and mild nonobstructive coronary disease most significant being 25 to 49% in the LAD, D1 and D2.  Since last seen he has some fatigue that he feels is related to a knee infection following replacement.  There is no dyspnea, chest pain or syncope.  Current Outpatient Medications  Medication Sig Dispense Refill   amLODipine (NORVASC) 5 MG tablet TAKE 2 TABLETS BY MOUTH EVERY DAY (Patient taking differently: Take 7.5 mg by mouth daily.) 180 tablet 0   amoxicillin (AMOXIL) 500 MG capsule Take 2 capsules (1,000 mg total) by mouth 2 (two) times daily. 120 capsule 0   ezetimibe (ZETIA) 10 MG tablet TAKE 1 TABLET BY MOUTH EVERY DAY 90 tablet 0   loperamide (IMODIUM) 2 MG capsule Take 2 mg by mouth as needed for diarrhea or loose stools.     meloxicam (MOBIC) 15 MG tablet Take 15 mg by mouth daily.     olmesartan (BENICAR) 20 MG tablet TAKE 1 TABLET BY MOUTH EVERY DAY 90 tablet 0   omeprazole (PRILOSEC) 40 MG capsule TAKE 1 CAPSULE (40 MG TOTAL) BY MOUTH IN THE MORNING AND AT BEDTIME. 180 capsule 3   REMICADE 100 MG injection Inject 400 mg into the vein every 8 (eight) weeks.     rosuvastatin (CRESTOR) 20 MG tablet TAKE 1 TABLET BY MOUTH EVERY DAY 90 tablet 0   traMADol (ULTRAM) 50 MG tablet Take 50 mg by mouth every 8 (eight) hours as needed for moderate pain.     venlafaxine (EFFEXOR) 37.5 MG tablet TAKE 1 TABLET BY MOUTH TWICE A DAY 180 tablet 0   triamcinolone ointment (KENALOG) 0.1 % Apply 1 Application topically 2 (two) times daily. (Patient not taking: Reported on 09/17/2023) 45 g 0   No current  facility-administered medications for this visit.     Past Medical History:  Diagnosis Date   Arthritis    Crohn's disease (HCC)    Depression    Dyspnea on exertion    GERD (gastroesophageal reflux disease)    Herpes    History of kidney stones    Hyperlipidemia    Hypertension    Instability of knee joint    Left displaced femoral neck fracture (HCC) 07/11/2017   Snoring     Past Surgical History:  Procedure Laterality Date   COLONOSCOPY  2007   Dr. Ozzie Hoyle normal TI, ascending colon/cecum with mild inflammation noted but biopses without signs of active inflammation, hyperpastic polyp, possible ischemia on path of left colon biopsy   COLONOSCOPY N/A 11/08/2015   Procedure: COLONOSCOPY;  Surgeon: Corbin Ade, MD;  Location: [facility];  Service: Endoscopy;  Laterality: N/A;   ESOPHAGOGASTRODUODENOSCOPY  2007   Dr. Ozzie Hoyle reflux esophagitis, hiatal hernia, negative H.pylori   ESOPHAGOGASTRODUODENOSCOPY N/A 11/08/2015   Procedure: ESOPHAGOGASTRODUODENOSCOPY (EGD);  Surgeon: Corbin Ade, MD;  Location: [facility];  Service: Endoscopy;  Laterality: N/A;  0830 - moved to 8:15 - office to notify   HIP PINNING,CANNULATED Left 07/12/2017   Procedure: CANNULATED HIP PINNING LEFT;  Surgeon: Samson Frederic, MD;  Location: [facility];  Service: Orthopedics;  Laterality: Left;   KNEE  ARTHROPLASTY Right 04/23/2023   Procedure: COMPUTER ASSISTED TOTAL KNEE ARTHROPLASTY;  Surgeon: Samson Frederic, MD;  Location: [facility];  Service: Orthopedics;  Laterality: Right;  160    Social History   Socioeconomic History   Marital status: Married    Spouse name: Tavares Levinson   Number of children: 2   Years of education: 14   Highest education level: Some college, no degree  Occupational History   Occupation: Retired  Tobacco Use   Smoking status: Never   Smokeless tobacco: Never  Vaping Use   Vaping status: Never Used  Substance and Sexual Activity   Alcohol use: Not Currently   Drug use: No    Sexual activity: Not Currently    Birth control/protection: None  Other Topics Concern   Not on file  Social History Narrative   Lives with wife. He has two children. He enjoys cycling, kayaking and working on model cars.   Social Determinants of Health   Financial Resource Strain: Low Risk  (08/12/2023)   Overall Financial Resource Strain (CARDIA)    Difficulty of Paying Living Expenses: Not hard at all  Food Insecurity: No Food Insecurity (08/12/2023)   Hunger Vital Sign    Worried About Running Out of Food in the Last Year: Never true    Ran Out of Food in the Last Year: Never true  Transportation Needs: No Transportation Needs (08/12/2023)   PRAPARE - Administrator, Civil Service (Medical): No    Lack of Transportation (Non-Medical): No  Physical Activity: Sufficiently Active (08/12/2023)   Exercise Vital Sign    Days of Exercise per Week: 3 days    Minutes of Exercise per Session: 60 min  Stress: No Stress Concern Present (08/12/2023)   [employer] of Occupational Health - Occupational Stress Questionnaire    Feeling of Stress : Not at all  Social Connections: Moderately Isolated (08/12/2023)   Social Connection and Isolation Panel [NHANES]    Frequency of Communication with Friends and Family: Three times a week    Frequency of Social Gatherings with Friends and Family: Once a week    Attends Religious Services: Never    Database administrator or Organizations: No    Attends Banker Meetings: Never    Marital Status: Married  Catering manager Violence: Not At Risk (08/12/2023)   Humiliation, Afraid, Rape, and Kick questionnaire    Fear of Current or Ex-Partner: No    Emotionally Abused: No    Physically Abused: No    Sexually Abused: No    Family History  Problem Relation Age of Onset   Melanoma Father    Heart disease Father    Healthy Mother    Colon cancer Neg Hx    Esophageal cancer Neg Hx     ROS: no fevers or chills,  productive cough, hemoptysis, dysphasia, odynophagia, melena, hematochezia, dysuria, hematuria, rash, seizure activity, orthopnea, PND, pedal edema, claudication. Remaining systems are negative.  Physical Exam: Well-developed well-nourished in no acute distress.  Skin is warm and dry.  HEENT is normal.  Neck is supple.  Chest is clear to auscultation with normal expansion.  Cardiovascular exam is regular rate and rhythm.  Abdominal exam nontender or distended. No masses palpated. Extremities show no edema. neuro grossly intact  ECG-September 22, 2023-sinus arrhythmia, no ST changes.  Personally reviewed  A/P  1 coronary artery disease-patient denies chest pain.  Continue medical therapy with aspirin and statin.  2 hypertension-blood pressure controlled.  Continue  present medical regimen.  3 hyperlipidemia-he is tolerating Crestor at moderate dose and was intolerant to other statins.  Will continue.  Will also continue Zetia.  Check lipids.  4 preoperative evaluation-patient has an infection in his right knee replacement.  He is scheduled to have surgery in 1 week.  He is not having chest pain or significant dyspnea and he bikes.  Therefore since he is asymptomatic with excellent functional capacity he does not require further cardiac evaluation preoperatively.  Note his previous CTA showed mild disease.  Olga Millers, MD

## 2023-09-11 LAB — COMPLETE METABOLIC PANEL WITH GFR
AG Ratio: 1.2 (calc) (ref 1.0–2.5)
ALT: 54 U/L — ABNORMAL HIGH (ref 9–46)
AST: 29 U/L (ref 10–35)
Albumin: 4.1 g/dL (ref 3.6–5.1)
Alkaline phosphatase (APISO): 63 U/L (ref 35–144)
BUN: 22 mg/dL (ref 7–25)
CO2: 27 mmol/L (ref 20–32)
Calcium: 9.6 mg/dL (ref 8.6–10.3)
Chloride: 102 mmol/L (ref 98–110)
Creat: 1.3 mg/dL (ref 0.70–1.35)
Globulin: 3.4 g/dL (calc) (ref 1.9–3.7)
Glucose, Bld: 86 mg/dL (ref 65–99)
Potassium: 4.8 mmol/L (ref 3.5–5.3)
Sodium: 138 mmol/L (ref 135–146)
Total Bilirubin: 0.4 mg/dL (ref 0.2–1.2)
Total Protein: 7.5 g/dL (ref 6.1–8.1)
eGFR: 60 mL/min/{1.73_m2} (ref >=60)

## 2023-09-11 LAB — CBC WITH DIFFERENTIAL/PLATELET
Absolute Monocytes: 989 cells/uL — ABNORMAL HIGH (ref 200–950)
Basophils Absolute: 34 cells/uL (ref 0–200)
Basophils Relative: 0.4 %
Eosinophils Absolute: 103 cells/uL (ref 15–500)
Eosinophils Relative: 1.2 %
HCT: 42.1 % (ref 38.5–50.0)
Hemoglobin: 13.8 g/dL (ref 13.2–17.1)
Lymphs Abs: 1445 cells/uL (ref 850–3900)
MCH: 29.5 pg (ref 27.0–33.0)
MCHC: 32.8 g/dL (ref 32.0–36.0)
MCV: 90 fL (ref 80.0–100.0)
MPV: 10.9 fL (ref 7.5–12.5)
Monocytes Relative: 11.5 %
Neutro Abs: 6029 cells/uL (ref 1500–7800)
Neutrophils Relative %: 70.1 %
Platelets: 287 10*3/uL (ref 140–400)
RBC: 4.68 10*6/uL (ref 4.20–5.80)
RDW: 13.6 % (ref 11.0–15.0)
Total Lymphocyte: 16.8 %
WBC: 8.6 10*3/uL (ref 3.8–10.8)

## 2023-09-11 LAB — C-REACTIVE PROTEIN: CRP: <3 mg/L (ref <8.0)

## 2023-09-11 LAB — SEDIMENTATION RATE: Sed Rate: 25 mm/h — ABNORMAL HIGH (ref 0–20)

## 2023-09-14 ENCOUNTER — Encounter: Payer: Self-pay | Admitting: Internal Medicine

## 2023-09-14 LAB — SERIAL MONITORING

## 2023-09-16 LAB — INFLIXIMAB+AB (SERIAL MONITOR)
Anti-Infliximab Antibody: 119 ng/mL
Infliximab Drug Level: 5.2 ug/mL

## 2023-09-17 ENCOUNTER — Ambulatory Visit: Payer: Medicare HMO

## 2023-09-17 NOTE — Progress Notes (Addendum)
COVID Vaccine received:  []  No [x]  Yes Date of any COVID positive Test in last 90 days: no PCP Everrett Coombe MD Cardiologist - Dr Jens Som  Chest x-ray -  EKG -  09/22/23 Epic Stress Test - 08/19/18 Epic ECHO 05/15/22 Epic  Cardiac Cath - no  Bowel Prep - [x]  No  []   Yes ______  Pacemaker / ICD device [x]  No []  Yes   Spinal Cord Stimulator:[x]  No []  Yes       History of Sleep Apnea? [x]  No []  Yes   CPAP used?- [x]  No []  Yes    Does the patient monitor blood sugar?          [x]  No []  Yes  []  N/A  Patient has: [x]  NO Hx DM   []  Pre-DM                 []  DM1  []   DM2 Does patient have a [employer] or Dexacom? []  No []  Yes   Fasting Blood Sugar Ranges-  Checks Blood Sugar _____ times a day  GLP1 agonist / usual dose - no GLP1 instructions:  SGLT-2 inhibitors / usual dose - no SGLT-2 instructions:   Blood Thinner / Instructions:no Aspirin Instructions:no  Comments:   Activity level: Patient is able  to climb a flight of stairs without difficulty; [x]  No CP  [x]  No SOB,    Patient can perform ADLs without assistance.   Anesthesia review: HTN, CKD, R BBB, Cardio appt. 09/24/23  Patient denies shortness of breath, fever, cough and chest pain at PAT appointment.  Patient verbalized understanding and agreement to the Pre-Surgical Instructions that were given to them at this PAT appointment. Patient was also educated of the need to review these PAT instructions again prior to his/her surgery.I reviewed the appropriate phone numbers to call if they have any and questions or concerns.

## 2023-09-17 NOTE — Patient Instructions (Signed)
SURGICAL WAITING ROOM VISITATION  Patients having surgery or a procedure may have no more than 2 support people in the waiting area - these visitors may rotate.    Children under the age of 1 must have an adult with them who is not the patient.  Due to an increase in RSV and influenza rates and associated hospitalizations, children ages 93 and under may not visit patients in [facility] hospitals.  If the patient needs to stay at the hospital during part of their recovery, the visitor guidelines for inpatient rooms apply. Pre-op nurse will coordinate an appropriate time for 1 support person to accompany patient in pre-op.  This support person may not rotate.    Please refer to the [facility] website for the visitor guidelines for Inpatients (after your surgery is over and you are in a regular room).       Your procedure is scheduled on: 10/01/23   Report to [facility] Main Entrance    Report to admitting at 8 AM   Call this number if you have problems the morning of surgery [phone]   Do not eat food :After Midnight.   After Midnight you may have the following liquids until _7:30 AM DAY OF SURGERY  Water Non-Citrus Juices (without pulp, NO RED-Apple, White grape, White cranberry) Black Coffee (NO MILK/CREAM OR CREAMERS, sugar ok)  Clear Tea (NO MILK/CREAM OR CREAMERS, sugar ok) regular and decaf                             Plain Jell-O (NO RED)                                           Fruit ices (not with fruit pulp, NO RED)                                     Popsicles (NO RED)                                                               Sports drinks like Gatorade (NO RED)                The day of surgery:  Drink ONE (1) Pre-Surgery Clear Ensure at 7:30  AM the morning of surgery. Drink in one sitting. Do not sip.  This drink was given to you during your hospital  pre-op appointment visit. Nothing else to drink after completing the  Pre-Surgery Clear  Ensure    Oral Hygiene is also important to reduce your risk of infection.                                    Remember - BRUSH YOUR TEETH THE MORNING OF SURGERY WITH YOUR REGULAR TOOTHPASTE   Stop all vitamins and herbal supplements 7 days before surgery.   Take these medicines the morning of surgery: Amlodipine, Ezetimibe, Rosuvastatin, Effexor  You may not have any metal on your body including hair pins, jewelry, and body piercing             Do not wear make-up, lotions, powders, perfumes/cologne, or deodorant  Do not wear nail polish including gel and S&S, artificial/acrylic nails, or any other type of covering on natural nails including finger and toenails. If you have artificial nails, gel coating, etc. that needs to be removed by a nail salon please have this removed prior to surgery or surgery may need to be canceled/ delayed if the surgeon/ anesthesia feels like they are unable to be safely monitored.   Do not shave  48 hours prior to surgery.               Men may shave face and neck.   Do not bring valuables to the hospital. [facility] IS NOT             RESPONSIBLE   FOR VALUABLES.   Contacts, glasses, dentures or bridgework may not be worn into surgery.   Bring small overnight bag day of surgery.   DO NOT BRING YOUR HOME MEDICATIONS TO THE HOSPITAL. PHARMACY WILL DISPENSE MEDICATIONS LISTED ON YOUR MEDICATION LIST TO YOU DURING YOUR ADMISSION IN THE HOSPITAL!    Patients discharged on the day of surgery will not be allowed to drive home.  Someone NEEDS to stay with you for the first 24 hours after anesthesia.   Special Instructions: Bring a copy of your healthcare power of attorney and living will documents the day of surgery if you haven't scanned them before.              Please read over the following fact sheets you were given: IF YOU HAVE QUESTIONS ABOUT YOUR PRE-OP INSTRUCTIONS PLEASE CALL [phone] [facility]   If you received a COVID test during  your pre-op visit  it is requested that you wear a mask when out in public, stay away from anyone that may not be feeling well and notify your surgeon if you develop symptoms. If you test positive for Covid or have been in contact with anyone that has tested positive in the last 10 days please notify you surgeon.      Pre-operative 5 CHG Bath Instructions   You can play a key role in reducing the risk of infection after surgery. Your skin needs to be as free of germs as possible. You can reduce the number of germs on your skin by washing with CHG (chlorhexidine gluconate) soap before surgery. CHG is an antiseptic soap that kills germs and continues to kill germs even after washing.   DO NOT use if you have an allergy to chlorhexidine/CHG or antibacterial soaps. If your skin becomes reddened or irritated, stop using the CHG and notify one of our RNs at [phone].   Please shower with the CHG soap starting 4 days before surgery using the following schedule:     Please keep in mind the following:  DO NOT shave, including legs and underarms, starting the day of your first shower.   You may shave your face at any point before/day of surgery.  Place clean sheets on your bed the day you start using CHG soap. Use a clean washcloth (not used since being washed) for each shower. DO NOT sleep with pets once you start using the CHG.   CHG Shower Instructions:  If you choose to wash your hair and private area, wash first with your normal  shampoo/soap.  After you use shampoo/soap, rinse your hair and body thoroughly to remove shampoo/soap residue.  Turn the water OFF and apply about 3 tablespoons (45 ml) of CHG soap to a CLEAN washcloth.  Apply CHG soap ONLY FROM YOUR NECK DOWN TO YOUR TOES (washing for 3-5 minutes)  DO NOT use CHG soap on face, private areas, open wounds, or sores.  Pay special attention to the area where your surgery is being performed.  If you are having back surgery, having  someone wash your back for you may be helpful. Wait 2 minutes after CHG soap is applied, then you may rinse off the CHG soap.  Pat dry with a clean towel  Put on clean clothes/pajamas   If you choose to wear lotion, please use ONLY the CHG-compatible lotions on the back of this paper.     Additional instructions for the day of surgery: DO NOT APPLY any lotions, deodorants, cologne, or perfumes.   Put on clean/comfortable clothes.  Brush your teeth.  Ask your nurse before applying any prescription medications to the skin.      CHG Compatible Lotions   Aveeno Moisturizing lotion  Cetaphil Moisturizing Cream  Cetaphil Moisturizing Lotion  Clairol Herbal Essence Moisturizing Lotion, Dry Skin  Clairol Herbal Essence Moisturizing Lotion, Extra Dry Skin  Clairol Herbal Essence Moisturizing Lotion, Normal Skin  Curel Age Defying Therapeutic Moisturizing Lotion with Alpha Hydroxy  Curel Extreme Care Body Lotion  Curel Soothing Hands Moisturizing Hand Lotion  Curel Therapeutic Moisturizing Cream, Fragrance-Free  Curel Therapeutic Moisturizing Lotion, Fragrance-Free  Curel Therapeutic Moisturizing Lotion, Original Formula  Eucerin Daily Replenishing Lotion  Eucerin Dry Skin Therapy Plus Alpha Hydroxy Crme  Eucerin Dry Skin Therapy Plus Alpha Hydroxy Lotion  Eucerin Original Crme  Eucerin Original Lotion  Eucerin Plus Crme Eucerin Plus Lotion  Eucerin TriLipid Replenishing Lotion  Keri Anti-Bacterial Hand Lotion  Keri Deep Conditioning Original Lotion Dry Skin Formula Softly Scented  Keri Deep Conditioning Original Lotion, Fragrance Free Sensitive Skin Formula  Keri Lotion Fast Absorbing Fragrance Free Sensitive Skin Formula  Keri Lotion Fast Absorbing Softly Scented Dry Skin Formula  Keri Original Lotion  Keri Skin Renewal Lotion Keri Silky Smooth Lotion  Keri Silky Smooth Sensitive Skin Lotion  Nivea Body Creamy Conditioning Oil  Nivea Body Extra Enriched Lotion  Nivea Body  Original Lotion  Nivea Body Sheer Moisturizing Lotion Nivea Crme  Nivea Skin Firming Lotion  NutraDerm 30 Skin Lotion  NutraDerm Skin Lotion  NutraDerm Therapeutic Skin Cream  NutraDerm Therapeutic Skin Lotion  ProShield Protective Hand Cream    Incentive Spirometer (Watch this video at home: [email or web address])  An incentive spirometer is a tool that can help keep your lungs clear and active. This tool measures how well you are filling your lungs with each breath. Taking long deep breaths may help reverse or decrease the chance of developing breathing (pulmonary) problems (especially infection) following: A long period of time when you are unable to move or be active. BEFORE THE PROCEDURE  If the spirometer includes an indicator to show your best effort, your nurse or respiratory therapist will set it to a desired goal. If possible, sit up straight or lean slightly forward. Try not to slouch. Hold the incentive spirometer in an upright position. INSTRUCTIONS FOR USE  Sit on the edge of your bed if possible, or sit up as far as you can in bed or on a chair. Hold the incentive spirometer in an upright position. Breathe out normally. Place the  mouthpiece in your mouth and seal your lips tightly around it. Breathe in slowly and as deeply as possible, raising the piston or the ball toward the top of the column. Hold your breath for 3-5 seconds or for as long as possible. Allow the piston or ball to fall to the bottom of the column. Remove the mouthpiece from your mouth and breathe out normally. Rest for a few seconds and repeat Steps 1 through 7 at least 10 times every 1-2 hours when you are awake. Take your time and take a few normal breaths between deep breaths. The spirometer may include an indicator to show your best effort. Use the indicator as a goal to work toward during each repetition. After each set of 10 deep breaths, practice coughing to be sure your  lungs are clear. If you have an incision (the cut made at the time of surgery), support your incision when coughing by placing a pillow or rolled up towels firmly against it. Once you are able to get out of bed, walk around indoors and cough well. You may stop using the incentive spirometer when instructed by your caregiver.  RISKS AND COMPLICATIONS Take your time so you do not get dizzy or light-headed. If you are in pain, you may need to take or ask for pain medication before doing incentive spirometry. It is harder to take a deep breath if you are having pain. AFTER USE Rest and breathe slowly and easily. It can be helpful to keep track of a log of your progress. Your caregiver can provide you with a simple table to help with this. If you are using the spirometer at home, follow these instructions: SEEK MEDICAL CARE IF:  You are having difficultly using the spirometer. You have trouble using the spirometer as often as instructed. Your pain medication is not giving enough relief while using the spirometer. You develop fever of 100.5 F (38.1 C) or higher. SEEK IMMEDIATE MEDICAL CARE IF:  You cough up bloody sputum that had not been present before. You develop fever of 102 F (38.9 C) or greater. You develop worsening pain at or near the incision site. MAKE SURE YOU:  Understand these instructions. Will watch your condition. Will get help right away if you are not doing well or get worse. Document Released: 04/21/2007 Document Revised: 03/02/2012 Document Reviewed: 06/22/2007 [facility] Patient Information 2014 [place], [place].

## 2023-09-22 ENCOUNTER — Encounter: Payer: Self-pay | Admitting: Cardiology

## 2023-09-22 ENCOUNTER — Encounter (HOSPITAL_COMMUNITY)
Admission: RE | Admit: 2023-09-22 | Discharge: 2023-09-22 | Disposition: A | Discharge status: Home / Self Care | Payer: Medicare HMO | Source: Ambulatory Visit | Attending: Orthopedic Surgery | Admitting: Orthopedic Surgery

## 2023-09-22 ENCOUNTER — Other Ambulatory Visit: Payer: Self-pay

## 2023-09-22 VITALS — BP 116/84 | HR 100 | Temp 98.1°F | Resp 16 | Ht 69.0 in | Wt 187.0 lb

## 2023-09-22 DIAGNOSIS — Z01812 Encounter for preprocedural laboratory examination: Secondary | ICD-10-CM | POA: Diagnosis not present

## 2023-09-22 DIAGNOSIS — Z01818 Encounter for other preprocedural examination: Secondary | ICD-10-CM

## 2023-09-22 DIAGNOSIS — T8453XA Infection and inflammatory reaction due to internal right knee prosthesis, initial encounter: Secondary | ICD-10-CM | POA: Insufficient documentation

## 2023-09-22 DIAGNOSIS — I10 Essential (primary) hypertension: Secondary | ICD-10-CM | POA: Insufficient documentation

## 2023-09-22 DIAGNOSIS — Z01810 Encounter for preprocedural cardiovascular examination: Secondary | ICD-10-CM | POA: Insufficient documentation

## 2023-09-22 HISTORY — DX: Obstructive sleep apnea (adult) (pediatric): G47.33

## 2023-09-22 LAB — CBC
HCT: 44.2 % (ref 39.0–52.0)
Hemoglobin: 14.4 g/dL (ref 13.0–17.0)
MCH: 29.3 pg (ref 26.0–34.0)
MCHC: 32.6 g/dL (ref 30.0–36.0)
MCV: 90 fL (ref 80.0–100.0)
Platelets: 253 10*3/uL (ref 150–400)
RBC: 4.91 MIL/uL (ref 4.22–5.81)
RDW: 14.1 % (ref 11.5–15.5)
WBC: 9.7 10*3/uL (ref 4.0–10.5)
nRBC: 0 % (ref 0.0–0.2)

## 2023-09-22 LAB — BASIC METABOLIC PANEL
Anion gap: 8 (ref 5–15)
BUN: 21 mg/dL (ref 8–23)
CO2: 25 mmol/L (ref 22–32)
Calcium: 8.9 mg/dL (ref 8.9–10.3)
Chloride: 104 mmol/L (ref 98–111)
Creatinine, Ser: 1.35 mg/dL — ABNORMAL HIGH (ref 0.61–1.24)
GFR, Estimated: 58 mL/min — ABNORMAL LOW (ref >60)
Glucose, Bld: 92 mg/dL (ref 70–99)
Potassium: 4.1 mmol/L (ref 3.5–5.1)
Sodium: 137 mmol/L (ref 135–145)

## 2023-09-23 LAB — SURGICAL PCR SCREEN
MRSA, PCR: NEGATIVE
Staphylococcus aureus: NEGATIVE

## 2023-09-24 ENCOUNTER — Ambulatory Visit: Payer: Medicare HMO | Attending: Cardiology | Admitting: Cardiology

## 2023-09-24 ENCOUNTER — Encounter: Payer: Self-pay | Admitting: Cardiology

## 2023-09-24 VITALS — BP 138/82 | HR 95 | Wt 197.0 lb

## 2023-09-24 DIAGNOSIS — E785 Hyperlipidemia, unspecified: Secondary | ICD-10-CM

## 2023-09-24 DIAGNOSIS — I2510 Atherosclerotic heart disease of native coronary artery without angina pectoris: Secondary | ICD-10-CM | POA: Diagnosis not present

## 2023-09-24 DIAGNOSIS — Z01810 Encounter for preprocedural cardiovascular examination: Secondary | ICD-10-CM | POA: Diagnosis not present

## 2023-09-24 DIAGNOSIS — I10 Essential (primary) hypertension: Secondary | ICD-10-CM

## 2023-09-24 NOTE — Patient Instructions (Signed)
    Lab Work:  Your physician recommends that you return for lab work WITH NEXT DRAW  If you have labs (blood work) drawn today and your tests are completely normal, you will receive your results only by: MyChart Message (if you have MyChart) OR A paper copy in the mail If you have any lab test that is abnormal or we need to change your treatment, we will call you to review the results.   Follow-Up: At [facility], you and your health needs are our priority.  As part of our continuing mission to provide you with exceptional heart care, we have created designated Provider Care Teams.  These Care Teams include your primary Cardiologist (physician) and Advanced Practice Providers (APPs -  Physician Assistants and Nurse Practitioners) who all work together to provide you with the care you need, when you need it.  We recommend signing up for the patient portal called "MyChart".  Sign up information is provided on this After Visit Summary.  MyChart is used to connect with patients for Virtual Visits (Telemedicine).  Patients are able to view lab/test results, encounter notes, upcoming appointments, etc.  Non-urgent messages can be sent to your provider as well.   To learn more about what you can do with MyChart, go to [email or web address].    Your next appointment:   12 month(s)  Provider:   Olga Millers MD

## 2023-09-25 ENCOUNTER — Encounter (HOSPITAL_COMMUNITY): Payer: Self-pay

## 2023-09-25 NOTE — Anesthesia Preprocedure Evaluation (Addendum)
Anesthesia Evaluation  Patient identified by MRN, date of birth, ID band Patient awake    Reviewed: Allergy & Precautions, NPO status , Patient's Chart, lab work & pertinent test results  Airway Mallampati: II  TM Distance: >3 FB Neck ROM: Full    Dental no notable dental hx. (+) Teeth Intact, Dental Advisory Given   Pulmonary sleep apnea (mild  no CPAP)    Pulmonary exam normal breath sounds clear to auscultation       Cardiovascular hypertension, Pt. on medications + CAD (non obstructive CAD)  Normal cardiovascular exam Rhythm:Regular Rate:Normal  04/2022 Echo  1. Left ventricular ejection fraction, by estimation, is 55 to 60%. The  left ventricle has normal function. The left ventricle has no regional  wall motion abnormalities. There is mild asymmetric left ventricular  hypertrophy of the septal segment. Left  ventricular diastolic parameters are consistent with Grade I diastolic  dysfunction (impaired relaxation).   2. Right ventricular systolic function is low normal. The right  ventricular size is normal. There is normal pulmonary artery systolic  pressure. The estimated right ventricular systolic pressure is 18.2 mmHg.   3. The mitral valve is grossly normal. Trivial mitral valve  regurgitation.   4. The aortic valve is tricuspid. There is mild calcification of the  aortic valve. Aortic valve regurgitation is not visualized.   5. The inferior vena cava is normal in size with greater than 50%  respiratory variability, suggesting right atrial pressure of 3 mmHg.      Neuro/Psych  PSYCHIATRIC DISORDERS  Depression       GI/Hepatic hiatal hernia,GERD  Medicated and Controlled,,Crohn's dz on remicade   Endo/Other    Renal/GU Renal InsufficiencyRenal disease     Musculoskeletal  (+) Arthritis , Osteoarthritis,    Abdominal   Peds  Hematology Lab Results      Component                Value               Date                       WBC                      9.7                 09/22/2023                HGB                      14.4                09/22/2023                HCT                      44.2                09/22/2023                MCV                      90.0                09/22/2023                PLT  253                 09/22/2023              Anesthesia Other Findings All: Sulfa, Statins  Reproductive/Obstetrics                             Anesthesia Physical Anesthesia Plan  ASA: 2  Anesthesia Plan: Spinal and Regional   Post-op Pain Management: Minimal or no pain anticipated and Regional block*   Induction:   PONV Risk Score and Plan: Propofol infusion, Treatment may vary due to age or medical condition and Ondansetron  Airway Management Planned: Natural Airway and Nasal Cannula  Additional Equipment: None  Intra-op Plan:   Post-operative Plan:   Informed Consent: I have reviewed the patients History and Physical, chart, labs and discussed the procedure including the risks, benefits and alternatives for the proposed anesthesia with the patient or authorized representative who has indicated his/her understanding and acceptance.     Dental advisory given  Plan Discussed with: CRNA  Anesthesia Plan Comments: (See PAT note from 9/30 by Sherlie Ban PA-C  Spinal + R Adductor canal )        Anesthesia Quick Evaluation

## 2023-09-25 NOTE — Progress Notes (Signed)
Case: [id number] Date/Time: 10/01/23 0815   Procedure: EXCISIONAL TOTAL KNEE ARTHROPLASTY WITH PLACEMENT OF ARTICULATING ANTIBIOTIC SPACER (Right: Knee)   Anesthesia type: Spinal   Pre-op diagnosis: Right knee post joint infection   [facility] ROOM 07 / [facility]   Surgeons: Samson Frederic, MD       DISCUSSION: Hamad Whyte is a 67 yo male who presents to PAT prior to surgery above. PMH of HTN, HLD, mild, non-obstructive CAD, mild OSA (no CPAP use), GERD, Crohn's disease on Remicade, depression, arthritis s/p R TKA on 04/23/2023 c/b septic joint.  Patient started to have pain in knee joint. Fluid was aspirated and culture was positive for S. Epidermidis. He has followed up with ID on 09/10/23 who states: "F/U with ortho, oct 9th surgery planned for spacer placement. I supect he will need PICC line and 6 weeks of IV abx. Engage ID during hospitalizaiton.  Patient follows with Cardiology for mild nonobstructive CAD. Last seen on 09/24/23 for pre-op clearance. Per Dr. Jens Som: "preoperative evaluation-patient has an infection in his right knee replacement.  He is scheduled to have surgery in 1 week.  He is not having chest pain or significant dyspnea and he bikes.  Therefore since he is asymptomatic with excellent functional capacity he does not require further cardiac evaluation preoperatively.  Note his previous CTA showed mild disease."  VS: BP 116/84   Pulse 100   Temp 36.7 C (Oral)   Resp 16   Ht 5\' 9"  (1.753 m)   Wt 84.8 kg   SpO2 97%   BMI 27.62 kg/m   PROVIDERS: Everrett Coombe, DO Cardiology: Olga Millers, MD ID: Danelle Earthly, MD  LABS: Labs reviewed: Acceptable for surgery. (all labs ordered are listed, but only abnormal results are displayed)  Labs Reviewed  BASIC METABOLIC PANEL - Abnormal; Notable for the following components:      Result Value   Creatinine, Ser 1.35 (*)    GFR, Estimated 58 (*)    All other components within normal limits  SURGICAL PCR SCREEN  CBC      IMAGES:  CTA Chest 05/30/2022:  IMPRESSION: No acute findings in the imaged extracardiac chest.   Esophageal air fluid level suggests dysmotility or gastroesophageal reflux.   EKG 09/22/23:  Sinus rhythm with Premature atrial complexes Incomplete right bundle branch block Minimal voltage criteria for LVH, may be normal variant ( R in aVL )   CV:  CTA Chest 05/30/2022: IMPRESSION: 1. Coronary calcium score of 538. This was 72 percentile for age-, sex, and race-matched controls.   2. Normal coronary origin with right dominance.   3. Mild (25-49) CAD in the LAD, D1 and D2.   4. Aortic atherosclerosis.   RECOMMENDATIONS: CAD-RADS 2: Mild non-obstructive CAD (25-49%). Consider non-atherosclerotic causes of chest pain. Consider preventive therapy and risk factor modification.  Echo 05/15/2022:  IMPRESSIONS     1. Left ventricular ejection fraction, by estimation, is 55 to 60%. The  left ventricle has normal function. The left ventricle has no regional  wall motion abnormalities. There is mild asymmetric left ventricular  hypertrophy of the septal segment. Left  ventricular diastolic parameters are consistent with Grade I diastolic  dysfunction (impaired relaxation).   2. Right ventricular systolic function is low normal. The right  ventricular size is normal. There is normal pulmonary artery systolic  pressure. The estimated right ventricular systolic pressure is 18.2 mmHg.   3. The mitral valve is grossly normal. Trivial mitral valve  regurgitation.   4.  The aortic valve is tricuspid. There is mild calcification of the  aortic valve. Aortic valve regurgitation is not visualized.   5. The inferior vena cava is normal in size with greater than 50%  respiratory variability, suggesting right atrial pressure of 3 mmHg.   Past Medical History:  Diagnosis Date   Arthritis    Crohn's disease (HCC)    Depression    Dyspnea on exertion    GERD (gastroesophageal  reflux disease)    Herpes    History of kidney stones    Hyperlipidemia    Hypertension    Instability of knee joint    Left displaced femoral neck fracture (HCC) 07/11/2017   Snoring     Past Surgical History:  Procedure Laterality Date   COLONOSCOPY  2007   Dr. Ozzie Hoyle normal TI, ascending colon/cecum with mild inflammation noted but biopses without signs of active inflammation, hyperpastic polyp, possible ischemia on path of left colon biopsy   COLONOSCOPY N/A 11/08/2015   Procedure: COLONOSCOPY;  Surgeon: Corbin Ade, MD;  [facility];  Service: Endoscopy;  Laterality: N/A;   ESOPHAGOGASTRODUODENOSCOPY  2007   Dr. Ozzie Hoyle reflux esophagitis, hiatal hernia, negative H.pylori   ESOPHAGOGASTRODUODENOSCOPY N/A 11/08/2015   Procedure: ESOPHAGOGASTRODUODENOSCOPY (EGD);  Surgeon: Corbin Ade, MD;  Location[facility];  Service: Endoscopy;  Laterality: N/A;  0830 - moved to 8:15 - office to notify   HIP PINNING,CANNULATED Left 07/12/2017   Procedure: CANNULATED HIP PINNING LEFT;  Surgeon: Samson Frederic, MD;  Location: [facility];  Service: Orthopedics;  Laterality: Left;   KNEE ARTHROPLASTY Right 04/23/2023   Procedure: COMPUTER ASSISTED TOTAL KNEE ARTHROPLASTY;  Surgeon: Samson Frederic, MD;  Location[facility];  Service: Orthopedics;  Laterality: Right;  160    MEDICATIONS:  amLODipine (NORVASC) 5 MG tablet   amoxicillin (AMOXIL) 500 MG capsule   ezetimibe (ZETIA) 10 MG tablet   loperamide (IMODIUM) 2 MG capsule   meloxicam (MOBIC) 15 MG tablet   olmesartan (BENICAR) 20 MG tablet   omeprazole (PRILOSEC) 40 MG capsule   REMICADE 100 MG injection   rosuvastatin (CRESTOR) 20 MG tablet   traMADol (ULTRAM) 50 MG tablet   triamcinolone ointment (KENALOG) 0.1 %   venlafaxine (EFFEXOR) 37.5 MG tablet   No current facility-administered medications for this encounter.   Marcille Blanco [facility] Surgical Short Stay/Anesthesiology [facility] Phone [phone] 09/25/2023 8:47  AM

## 2023-09-29 ENCOUNTER — Telehealth: Payer: Self-pay | Admitting: Internal Medicine

## 2023-09-29 NOTE — Telephone Encounter (Signed)
Spoke with pt in regards to left knew and antibiotics. I explained that pt needs the OR intervention as he has PJI and antibiotics alone are not curative.  He plans on starting amox prior  ot surgery, noted"sores" that had pre-empretively no clinical concern for ongoing infection of knee.

## 2023-09-30 ENCOUNTER — Ambulatory Visit: Payer: Self-pay | Admitting: Student

## 2023-09-30 NOTE — H&P (View-Only) (Signed)
PREOPERATIVE H&P  Chief Complaint: Right knee periprosthetic joint infection.   HPI: Tyler Gates is a 67 y.o. male with a history of right total knee arthroplasty 04/23/23. He presents for preoperative history and physical with a diagnosis of right knee periprosthetic joint infection with staph epidermidis. Patient currently taking amoxicillin 1g BID per recommendations of ID Dr. Thedore Mins. Patient currently not taking recommended levoquin due to potential side effect profile. Symptoms are rated as moderate to severe, and have been worsening.  This is significantly impairing activities of daily living.  He has elected for surgical management.     Past Medical History:  Diagnosis Date   Arthritis    Crohn's disease (HCC)    Depression    Dyspnea on exertion    GERD (gastroesophageal reflux disease)    Herpes    History of kidney stones    Hyperlipidemia    Hypertension    Instability of knee joint    Left displaced femoral neck fracture (HCC) 07/11/2017   OSA (obstructive sleep apnea)    mild   Snoring    Past Surgical History:  Procedure Laterality Date   COLONOSCOPY  2007   Dr. Ozzie Hoyle normal TI, ascending colon/cecum with mild inflammation noted but biopses without signs of active inflammation, hyperpastic polyp, possible ischemia on path of left colon biopsy   COLONOSCOPY N/A 11/08/2015   Procedure: COLONOSCOPY;  Surgeon: Corbin Ade, MD;  Location: [facility];  Service: Endoscopy;  Laterality: N/A;   ESOPHAGOGASTRODUODENOSCOPY  2007   Dr. Ozzie Hoyle reflux esophagitis, hiatal hernia, negative H.pylori   ESOPHAGOGASTRODUODENOSCOPY N/A 11/08/2015   Procedure: ESOPHAGOGASTRODUODENOSCOPY (EGD);  Surgeon: Corbin Ade, MD;  Location: [facility];  Service: Endoscopy;  Laterality: N/A;  0830 - moved to 8:15 - office to notify   HIP PINNING,CANNULATED Left 07/12/2017   Procedure: CANNULATED HIP PINNING LEFT;  Surgeon: Samson Frederic, MD;  Location: [facility];  Service: Orthopedics;   Laterality: Left;   KNEE ARTHROPLASTY Right 04/23/2023   Procedure: COMPUTER ASSISTED TOTAL KNEE ARTHROPLASTY;  Surgeon: Samson Frederic, MD;  Location: [facility];  Service: Orthopedics;  Laterality: Right;  160   Social History   Socioeconomic History   Marital status: Married    Spouse name: Jaiyden Laur   Number of children: 2   Years of education: 14   Highest education level: Some college, no degree  Occupational History   Occupation: Retired  Tobacco Use   Smoking status: Never   Smokeless tobacco: Never  Vaping Use   Vaping status: Never Used  Substance and Sexual Activity   Alcohol use: Not Currently   Drug use: No   Sexual activity: Not Currently    Birth control/protection: None  Other Topics Concern   Not on file  Social History Narrative   Lives with wife. He has two children. He enjoys cycling, kayaking and working on model cars.   Social Determinants of Health   Financial Resource Strain: Low Risk  (08/12/2023)   Overall Financial Resource Strain (CARDIA)    Difficulty of Paying Living Expenses: Not hard at all  Food Insecurity: No Food Insecurity (08/12/2023)   Hunger Vital Sign    Worried About Running Out of Food in the Last Year: Never true    Ran Out of Food in the Last Year: Never true  Transportation Needs: No Transportation Needs (08/12/2023)   PRAPARE - Administrator, Civil Service (Medical): No    Lack of Transportation (Non-Medical): No  Physical  Activity: Sufficiently Active (08/12/2023)   Exercise Vital Sign    Days of Exercise per Week: 3 days    Minutes of Exercise per Session: 60 min  Stress: No Stress Concern Present (08/12/2023)   [employer] of Occupational Health - Occupational Stress Questionnaire    Feeling of Stress : Not at all  Social Connections: Moderately Isolated (08/12/2023)   Social Connection and Isolation Panel [NHANES]    Frequency of Communication with Friends and Family: Three times a week    Frequency of  Social Gatherings with Friends and Family: Once a week    Attends Religious Services: Never    Database administrator or Organizations: No    Attends Engineer, structural: Never    Marital Status: Married   Family History  Problem Relation Age of Onset   Melanoma Father    Heart disease Father    Healthy Mother    Colon cancer Neg Hx    Esophageal cancer Neg Hx    Allergies  Allergen Reactions   Sulfasalazine Other (See Comments)    Flu like feelings   Statins Other (See Comments)    REACTION: Myalgia   Prior to Admission medications   Medication Sig Start Date End Date Taking? Authorizing Provider  amLODipine (NORVASC) 5 MG tablet TAKE 2 TABLETS BY MOUTH EVERY DAY Patient taking differently: Take 7.5 mg by mouth daily. 02/25/23   Lewayne Bunting, MD  amoxicillin (AMOXIL) 500 MG capsule Take 2 capsules (1,000 mg total) by mouth 2 (two) times daily. 09/10/23 10/10/23  Danelle Earthly, MD  ezetimibe (ZETIA) 10 MG tablet TAKE 1 TABLET BY MOUTH EVERY DAY 07/24/23   Hilty, Lisette Abu, MD  loperamide (IMODIUM) 2 MG capsule Take 2 mg by mouth as needed for diarrhea or loose stools.    [provider]  meloxicam (MOBIC) 15 MG tablet Take 15 mg by mouth daily. 07/08/22   [provider]  olmesartan (BENICAR) 20 MG tablet TAKE 1 TABLET BY MOUTH EVERY DAY 08/14/23   Lewayne Bunting, MD  omeprazole (PRILOSEC) 40 MG capsule TAKE 1 CAPSULE (40 MG TOTAL) BY MOUTH IN THE MORNING AND AT BEDTIME. 05/12/23   Matthews, Cody, DO  REMICADE 100 MG injection Inject 400 mg into the vein every 8 (eight) weeks.    [provider]  rosuvastatin (CRESTOR) 20 MG tablet TAKE 1 TABLET BY MOUTH EVERY DAY 07/29/23   Lewayne Bunting, MD  traMADol (ULTRAM) 50 MG tablet Take 50 mg by mouth every 8 (eight) hours as needed for moderate pain.    [provider]  triamcinolone ointment (KENALOG) 0.1 % Apply 1 Application topically 2 (two) times daily. Patient not taking: Reported on  09/17/2023 07/08/23   Eustace Moore, MD  venlafaxine [facility]) 37.5 MG tablet TAKE 1 TABLET BY MOUTH TWICE A DAY 07/29/23   Everrett Coombe, DO     Positive ROS: All other systems have been reviewed and were otherwise negative with the exception of those mentioned in the HPI and as above.  Physical Exam: General: Alert, no acute distress Cardiovascular: No pedal edema Respiratory: No cyanosis, no use of accessory musculature GI: No organomegaly, abdomen is soft and non-tender Neurologic: Sensation intact distally Psychiatric: Patient is competent for consent with normal mood and affect Lymphatic: No axillary or cervical lymphadenopathy  MUSCULOSKELETAL:   Examination of the right knee reveals a well healed incision. Small effusion, prepatellar swelling noted. He has some geographic excoriation around the incision. No drainage  or erythema. No wound dehiscence. Range of motion 0 to 120 degrees (preoperative range of motion 0 to 125 degrees). No ligamentous instability.  Sensory and motor function intact in LE bilaterally. Distal pedal pulses 2+ bilaterally. No significant pedal edema. Calves soft and non-tender.    Assessment: Right knee periprosthetic joint infection, staphylococcus epidermidis.   Plan: Plan for resection of right total knee arthroplasty and placement of articulating antibiotic spacer. Plan for 6 weeks of IV antibiotics followed by a 2-week antibiotic holiday. After the appropriate antibiotic holiday, we will repeat his testing. If the infection is still present, he will need repeat debridement and spacer exchange. If the infection is cured, we will proceed with reimplantation right total knee arthroplasty. Recommended oral levaquin due to his immunosuppression from Remicade and Crohn's disease, but patient not taking due to side effect profile. Hold Remicade. Continue amoxicillin.   The risks benefits and alternatives were discussed with the patient including but not  limited to the risks of nonoperative treatment, versus surgical intervention including infection, bleeding, nerve injury,  blood clots, cardiopulmonary complications, morbidity, mortality, among others, and they were willing to proceed.    Therapy Plans: HEP.  Disposition: Home with wife.  Planned DVT Prophylaxis: aspirin 81mg  BID.  DME needed: Has rolling walker. Has ice machine.  PCP: Cleared.  Cardiology: Cleared.  TXA: IV Allergies:  - Sulfa antibiotics - aches and pains.  - Sulfasalazine - dizziness - Statins - diarrhea.  Anesthesia Concerns: None.  BMI: 27.7. Last HgbA1c: Not diabetic.  Other: - Crohns disease, Remicade infusions last dose in July, 2024. He will hold Remicade until he has adequate wound healing and completion of IV antibiotics. Patient has not been taking Levaquin due to side effect profile he read online. Amoxicillin 1g BID from Dr. Thedore Mins.  - Staph epidermidis, resistant to tetracycline and clindamycin, sensitive to oxicillin.  - Had difficulty with pain control with oxycodone 5mg  after last surgery. - Oxycodone vs dilaudid, zofran, has meloxicam. Hold NSAID.  - 09/22/23: WBC 9.7, Hgb 14.4, Cr. 1.35, K+ 4.1.   Clois Dupes, PA-C [phone]   09/30/2023 7:53 PM

## 2023-09-30 NOTE — H&P (Cosign Needed Addendum)
PREOPERATIVE H&P  Chief Complaint: Right knee periprosthetic joint infection.   HPI: Tyler Gates is a 67 y.o. male with a history of right total knee arthroplasty 04/23/23. He presents for preoperative history and physical with a diagnosis of right knee periprosthetic joint infection with staph epidermidis. Patient currently taking amoxicillin 1g BID per recommendations of ID Dr. Thedore Mins. Patient currently not taking recommended levoquin due to potential side effect profile. Symptoms are rated as moderate to severe, and have been worsening.  This is significantly impairing activities of daily living.  He has elected for surgical management.     Past Medical History:  Diagnosis Date   Arthritis    Crohn's disease (HCC)    Depression    Dyspnea on exertion    GERD (gastroesophageal reflux disease)    Herpes    History of kidney stones    Hyperlipidemia    Hypertension    Instability of knee joint    Left displaced femoral neck fracture (HCC) 07/11/2017   OSA (obstructive sleep apnea)    mild   Snoring    Past Surgical History:  Procedure Laterality Date   COLONOSCOPY  2007   Dr. Ozzie Hoyle normal TI, ascending colon/cecum with mild inflammation noted but biopses without signs of active inflammation, hyperpastic polyp, possible ischemia on path of left colon biopsy   COLONOSCOPY N/A 11/08/2015   Procedure: COLONOSCOPY;  Surgeon: Corbin Ade, MD;  Location: [facility];  Service: Endoscopy;  Laterality: N/A;   ESOPHAGOGASTRODUODENOSCOPY  2007   Dr. Ozzie Hoyle reflux esophagitis, hiatal hernia, negative H.pylori   ESOPHAGOGASTRODUODENOSCOPY N/A 11/08/2015   Procedure: ESOPHAGOGASTRODUODENOSCOPY (EGD);  Surgeon: Corbin Ade, MD;  Location: [facility];  Service: Endoscopy;  Laterality: N/A;  0830 - moved to 8:15 - office to notify   HIP PINNING,CANNULATED Left 07/12/2017   Procedure: CANNULATED HIP PINNING LEFT;  Surgeon: Samson Frederic, MD;  Location: [facility];  Service: Orthopedics;   Laterality: Left;   KNEE ARTHROPLASTY Right 04/23/2023   Procedure: COMPUTER ASSISTED TOTAL KNEE ARTHROPLASTY;  Surgeon: Samson Frederic, MD;  Location: [facility];  Service: Orthopedics;  Laterality: Right;  160   Social History   Socioeconomic History   Marital status: Married    Spouse name: Jaiyden Laur   Number of children: 2   Years of education: 14   Highest education level: Some college, no degree  Occupational History   Occupation: Retired  Tobacco Use   Smoking status: Never   Smokeless tobacco: Never  Vaping Use   Vaping status: Never Used  Substance and Sexual Activity   Alcohol use: Not Currently   Drug use: No   Sexual activity: Not Currently    Birth control/protection: None  Other Topics Concern   Not on file  Social History Narrative   Lives with wife. He has two children. He enjoys cycling, kayaking and working on model cars.   Social Determinants of Health   Financial Resource Strain: Low Risk  (08/12/2023)   Overall Financial Resource Strain (CARDIA)    Difficulty of Paying Living Expenses: Not hard at all  Food Insecurity: No Food Insecurity (08/12/2023)   Hunger Vital Sign    Worried About Running Out of Food in the Last Year: Never true    Ran Out of Food in the Last Year: Never true  Transportation Needs: No Transportation Needs (08/12/2023)   PRAPARE - Administrator, Civil Service (Medical): No    Lack of Transportation (Non-Medical): No  Physical  Activity: Sufficiently Active (08/12/2023)   Exercise Vital Sign    Days of Exercise per Week: 3 days    Minutes of Exercise per Session: 60 min  Stress: No Stress Concern Present (08/12/2023)   [employer] of Occupational Health - Occupational Stress Questionnaire    Feeling of Stress : Not at all  Social Connections: Moderately Isolated (08/12/2023)   Social Connection and Isolation Panel [NHANES]    Frequency of Communication with Friends and Family: Three times a week    Frequency of  Social Gatherings with Friends and Family: Once a week    Attends Religious Services: Never    Database administrator or Organizations: No    Attends Engineer, structural: Never    Marital Status: Married   Family History  Problem Relation Age of Onset   Melanoma Father    Heart disease Father    Healthy Mother    Colon cancer Neg Hx    Esophageal cancer Neg Hx    Allergies  Allergen Reactions   Sulfasalazine Other (See Comments)    Flu like feelings   Statins Other (See Comments)    REACTION: Myalgia   Prior to Admission medications   Medication Sig Start Date End Date Taking? Authorizing Provider  amLODipine (NORVASC) 5 MG tablet TAKE 2 TABLETS BY MOUTH EVERY DAY Patient taking differently: Take 7.5 mg by mouth daily. 02/25/23   Lewayne Bunting, MD  amoxicillin (AMOXIL) 500 MG capsule Take 2 capsules (1,000 mg total) by mouth 2 (two) times daily. 09/10/23 10/10/23  Danelle Earthly, MD  ezetimibe (ZETIA) 10 MG tablet TAKE 1 TABLET BY MOUTH EVERY DAY 07/24/23   Hilty, Lisette Abu, MD  loperamide (IMODIUM) 2 MG capsule Take 2 mg by mouth as needed for diarrhea or loose stools.    [provider]  meloxicam (MOBIC) 15 MG tablet Take 15 mg by mouth daily. 07/08/22   [provider]  olmesartan (BENICAR) 20 MG tablet TAKE 1 TABLET BY MOUTH EVERY DAY 08/14/23   Lewayne Bunting, MD  omeprazole (PRILOSEC) 40 MG capsule TAKE 1 CAPSULE (40 MG TOTAL) BY MOUTH IN THE MORNING AND AT BEDTIME. 05/12/23   Matthews, Cody, DO  REMICADE 100 MG injection Inject 400 mg into the vein every 8 (eight) weeks.    [provider]  rosuvastatin (CRESTOR) 20 MG tablet TAKE 1 TABLET BY MOUTH EVERY DAY 07/29/23   Lewayne Bunting, MD  traMADol (ULTRAM) 50 MG tablet Take 50 mg by mouth every 8 (eight) hours as needed for moderate pain.    [provider]  triamcinolone ointment (KENALOG) 0.1 % Apply 1 Application topically 2 (two) times daily. Patient not taking: Reported on  09/17/2023 07/08/23   Eustace Moore, MD  venlafaxine [facility]) 37.5 MG tablet TAKE 1 TABLET BY MOUTH TWICE A DAY 07/29/23   Everrett Coombe, DO     Positive ROS: All other systems have been reviewed and were otherwise negative with the exception of those mentioned in the HPI and as above.  Physical Exam: General: Alert, no acute distress Cardiovascular: No pedal edema Respiratory: No cyanosis, no use of accessory musculature GI: No organomegaly, abdomen is soft and non-tender Neurologic: Sensation intact distally Psychiatric: Patient is competent for consent with normal mood and affect Lymphatic: No axillary or cervical lymphadenopathy  MUSCULOSKELETAL:   Examination of the right knee reveals a well healed incision. Small effusion, prepatellar swelling noted. He has some geographic excoriation around the incision. No drainage  or erythema. No wound dehiscence. Range of motion 0 to 120 degrees (preoperative range of motion 0 to 125 degrees). No ligamentous instability.  Sensory and motor function intact in LE bilaterally. Distal pedal pulses 2+ bilaterally. No significant pedal edema. Calves soft and non-tender.    Assessment: Right knee periprosthetic joint infection, staphylococcus epidermidis.   Plan: Plan for resection of right total knee arthroplasty and placement of articulating antibiotic spacer. Plan for 6 weeks of IV antibiotics followed by a 2-week antibiotic holiday. After the appropriate antibiotic holiday, we will repeat his testing. If the infection is still present, he will need repeat debridement and spacer exchange. If the infection is cured, we will proceed with reimplantation right total knee arthroplasty. Recommended oral levaquin due to his immunosuppression from Remicade and Crohn's disease, but patient not taking due to side effect profile. Hold Remicade. Continue amoxicillin.   The risks benefits and alternatives were discussed with the patient including but not  limited to the risks of nonoperative treatment, versus surgical intervention including infection, bleeding, nerve injury,  blood clots, cardiopulmonary complications, morbidity, mortality, among others, and they were willing to proceed.    Therapy Plans: HEP.  Disposition: Home with wife.  Planned DVT Prophylaxis: aspirin 81mg  BID.  DME needed: Has rolling walker. Has ice machine.  PCP: Cleared.  Cardiology: Cleared.  TXA: IV Allergies:  - Sulfa antibiotics - aches and pains.  - Sulfasalazine - dizziness - Statins - diarrhea.  Anesthesia Concerns: None.  BMI: 27.7. Last HgbA1c: Not diabetic.  Other: - Crohns disease, Remicade infusions last dose in July, 2024. He will hold Remicade until he has adequate wound healing and completion of IV antibiotics. Patient has not been taking Levaquin due to side effect profile he read online. Amoxicillin 1g BID from Dr. Thedore Mins.  - Staph epidermidis, resistant to tetracycline and clindamycin, sensitive to oxicillin.  - Had difficulty with pain control with oxycodone 5mg  after last surgery. - Oxycodone vs dilaudid, zofran, has meloxicam. Hold NSAID.  - 09/22/23: WBC 9.7, Hgb 14.4, Cr. 1.35, K+ 4.1.   Clois Dupes, PA-C [phone]   09/30/2023 7:53 PM

## 2023-10-01 ENCOUNTER — Inpatient Hospital Stay (HOSPITAL_COMMUNITY): Payer: Medicare HMO

## 2023-10-01 ENCOUNTER — Inpatient Hospital Stay (HOSPITAL_COMMUNITY): Payer: Medicare HMO | Admitting: Medical

## 2023-10-01 ENCOUNTER — Encounter (HOSPITAL_COMMUNITY)
Admission: RE | Disposition: A | Discharge status: Home Health | Payer: Self-pay | Source: Ambulatory Visit | Attending: Orthopedic Surgery

## 2023-10-01 ENCOUNTER — Inpatient Hospital Stay (HOSPITAL_COMMUNITY)
Admission: RE | Admit: 2023-10-01 | Discharge: 2023-10-04 | DRG: 467 | Disposition: A | Discharge status: Home Health | Payer: Medicare HMO | Source: Ambulatory Visit | Attending: Orthopedic Surgery | Admitting: Orthopedic Surgery

## 2023-10-01 ENCOUNTER — Inpatient Hospital Stay (HOSPITAL_COMMUNITY): Payer: Medicare HMO | Admitting: Anesthesiology

## 2023-10-01 ENCOUNTER — Other Ambulatory Visit: Payer: Self-pay

## 2023-10-01 ENCOUNTER — Encounter (HOSPITAL_COMMUNITY): Payer: Self-pay | Admitting: Orthopedic Surgery

## 2023-10-01 DIAGNOSIS — Y792 Prosthetic and other implants, materials and accessory orthopedic devices associated with adverse incidents: Secondary | ICD-10-CM | POA: Diagnosis present

## 2023-10-01 DIAGNOSIS — M1990 Unspecified osteoarthritis, unspecified site: Secondary | ICD-10-CM | POA: Diagnosis present

## 2023-10-01 DIAGNOSIS — Z1624 Resistance to multiple antibiotics: Secondary | ICD-10-CM | POA: Diagnosis present

## 2023-10-01 DIAGNOSIS — T8453XD Infection and inflammatory reaction due to internal right knee prosthesis, subsequent encounter: Secondary | ICD-10-CM | POA: Diagnosis not present

## 2023-10-01 DIAGNOSIS — R609 Edema, unspecified: Secondary | ICD-10-CM | POA: Diagnosis not present

## 2023-10-01 DIAGNOSIS — Z808 Family history of malignant neoplasm of other organs or systems: Secondary | ICD-10-CM

## 2023-10-01 DIAGNOSIS — Z888 Allergy status to other drugs, medicaments and biological substances status: Secondary | ICD-10-CM | POA: Diagnosis not present

## 2023-10-01 DIAGNOSIS — G4733 Obstructive sleep apnea (adult) (pediatric): Secondary | ICD-10-CM | POA: Diagnosis not present

## 2023-10-01 DIAGNOSIS — K5090 Crohn's disease, unspecified, without complications: Secondary | ICD-10-CM | POA: Diagnosis present

## 2023-10-01 DIAGNOSIS — E785 Hyperlipidemia, unspecified: Secondary | ICD-10-CM | POA: Diagnosis not present

## 2023-10-01 DIAGNOSIS — Z791 Long term (current) use of non-steroidal anti-inflammatories (NSAID): Secondary | ICD-10-CM

## 2023-10-01 DIAGNOSIS — Z8249 Family history of ischemic heart disease and other diseases of the circulatory system: Secondary | ICD-10-CM | POA: Diagnosis not present

## 2023-10-01 DIAGNOSIS — I10 Essential (primary) hypertension: Secondary | ICD-10-CM | POA: Diagnosis not present

## 2023-10-01 DIAGNOSIS — D62 Acute posthemorrhagic anemia: Secondary | ICD-10-CM | POA: Diagnosis not present

## 2023-10-01 DIAGNOSIS — D84821 Immunodeficiency due to drugs: Secondary | ICD-10-CM | POA: Diagnosis not present

## 2023-10-01 DIAGNOSIS — Z96651 Presence of right artificial knee joint: Secondary | ICD-10-CM | POA: Diagnosis not present

## 2023-10-01 DIAGNOSIS — F32A Depression, unspecified: Secondary | ICD-10-CM | POA: Diagnosis present

## 2023-10-01 DIAGNOSIS — Z87442 Personal history of urinary calculi: Secondary | ICD-10-CM | POA: Diagnosis not present

## 2023-10-01 DIAGNOSIS — Z882 Allergy status to sulfonamides status: Secondary | ICD-10-CM | POA: Diagnosis not present

## 2023-10-01 DIAGNOSIS — Z79899 Other long term (current) drug therapy: Secondary | ICD-10-CM

## 2023-10-01 DIAGNOSIS — G8918 Other acute postprocedural pain: Secondary | ICD-10-CM | POA: Diagnosis not present

## 2023-10-01 DIAGNOSIS — Y831 Surgical operation with implant of artificial internal device as the cause of abnormal reaction of the patient, or of later complication, without mention of misadventure at the time of the procedure: Secondary | ICD-10-CM | POA: Diagnosis present

## 2023-10-01 DIAGNOSIS — Z8619 Personal history of other infectious and parasitic diseases: Secondary | ICD-10-CM

## 2023-10-01 DIAGNOSIS — K219 Gastro-esophageal reflux disease without esophagitis: Secondary | ICD-10-CM | POA: Diagnosis present

## 2023-10-01 DIAGNOSIS — B9689 Other specified bacterial agents as the cause of diseases classified elsewhere: Secondary | ICD-10-CM | POA: Diagnosis not present

## 2023-10-01 DIAGNOSIS — T8453XA Infection and inflammatory reaction due to internal right knee prosthesis, initial encounter: Secondary | ICD-10-CM

## 2023-10-01 DIAGNOSIS — Z79620 Long term (current) use of immunosuppressive biologic: Secondary | ICD-10-CM

## 2023-10-01 DIAGNOSIS — B957 Other staphylococcus as the cause of diseases classified elsewhere: Secondary | ICD-10-CM | POA: Diagnosis present

## 2023-10-01 DIAGNOSIS — Z9889 Other specified postprocedural states: Secondary | ICD-10-CM | POA: Diagnosis not present

## 2023-10-01 HISTORY — PX: EXCISIONAL TOTAL KNEE ARTHROPLASTY WITH ANTIBIOTIC SPACERS: SHX5827

## 2023-10-01 LAB — TYPE AND SCREEN
ABO/RH(D): O POS
Antibody Screen: NEGATIVE

## 2023-10-01 LAB — POCT I-STAT, CHEM 8
BUN: 17 mg/dL (ref 8–23)
Calcium, Ion: 1.19 mmol/L (ref 1.15–1.40)
Chloride: 100 mmol/L (ref 98–111)
Creatinine, Ser: 1.2 mg/dL (ref 0.61–1.24)
Glucose, Bld: 171 mg/dL — ABNORMAL HIGH (ref 70–99)
HCT: 27 % — ABNORMAL LOW (ref 39.0–52.0)
Hemoglobin: 9.2 g/dL — ABNORMAL LOW (ref 13.0–17.0)
Potassium: 4.3 mmol/L (ref 3.5–5.1)
Sodium: 138 mmol/L (ref 135–145)
TCO2: 23 mmol/L (ref 22–32)

## 2023-10-01 LAB — ABO/RH: ABO/RH(D): O POS

## 2023-10-01 SURGERY — REMOVAL, TOTAL ARTHROPLASTY HARDWARE, KNEE, WITH ANTIBIOTIC SPACER INSERTION
Anesthesia: Spinal | Site: Knee | Laterality: Right

## 2023-10-01 MED ORDER — ALUM & MAG HYDROXIDE-SIMETH 200-200-20 MG/5ML PO SUSP: 30.0000 mL | ORAL | Status: DC | PRN

## 2023-10-01 MED ORDER — ACETAMINOPHEN 10 MG/ML IV SOLN
INTRAVENOUS | Status: AC
  Administered 2023-10-01: 1000 mg via INTRAVENOUS
  Filled 2023-10-01: qty 100

## 2023-10-01 MED ORDER — LACTATED RINGERS IV SOLN: INTRAVENOUS | Status: DC

## 2023-10-01 MED ORDER — HYDROMORPHONE HCL 1 MG/ML IJ SOLN
INTRAMUSCULAR | Status: AC
  Filled 2023-10-01: qty 1

## 2023-10-01 MED ORDER — DEXMEDETOMIDINE HCL IN NACL 80 MCG/20ML IV SOLN
INTRAVENOUS | Status: DC | PRN
Start: 2023-10-01 — End: 2023-10-01
  Administered 2023-10-01: 4 ug via INTRAVENOUS

## 2023-10-01 MED ORDER — SODIUM CHLORIDE 0.9 % IV SOLN: 250.0000 mL | INTRAVENOUS | Status: AC

## 2023-10-01 MED ORDER — FENTANYL CITRATE (PF) 100 MCG/2ML IJ SOLN
INTRAMUSCULAR | Status: DC | PRN
  Administered 2023-10-01 (×2): 25 ug via INTRAVENOUS
  Administered 2023-10-01: 50 ug via INTRAVENOUS

## 2023-10-01 MED ORDER — METOCLOPRAMIDE HCL 5 MG/ML IJ SOLN: 5.0000 mg | Freq: Three times a day (TID) | INTRAMUSCULAR | Status: DC | PRN

## 2023-10-01 MED ORDER — POVIDONE-IODINE 10 % EX SWAB: 2.0000 | Freq: Once | CUTANEOUS | Status: DC

## 2023-10-01 MED ORDER — VANCOMYCIN HCL 1000 MG IV SOLR
INTRAVENOUS | Status: AC
  Filled 2023-10-01: qty 60

## 2023-10-01 MED ORDER — MIDAZOLAM HCL 2 MG/2ML IJ SOLN
INTRAMUSCULAR | Status: AC
  Filled 2023-10-01: qty 2

## 2023-10-01 MED ORDER — HYDROMORPHONE HCL 1 MG/ML IJ SOLN: 0.5000 mg | INTRAMUSCULAR | Status: DC | PRN

## 2023-10-01 MED ORDER — ALBUMIN HUMAN 5 % IV SOLN
INTRAVENOUS | Status: AC
  Administered 2023-10-01: 12.5 g via INTRAVENOUS
  Filled 2023-10-01: qty 250

## 2023-10-01 MED ORDER — ONDANSETRON HCL 4 MG/2ML IJ SOLN
INTRAMUSCULAR | Status: AC
  Filled 2023-10-01: qty 2

## 2023-10-01 MED ORDER — CEFAZOLIN SODIUM-DEXTROSE 2-3 GM-%(50ML) IV SOLR
INTRAVENOUS | Status: DC | PRN
Start: 2023-10-01 — End: 2023-10-01
  Administered 2023-10-01: 2 g via INTRAVENOUS

## 2023-10-01 MED ORDER — SODIUM CHLORIDE (PF) 0.9 % IJ SOLN
INTRAMUSCULAR | Status: AC
  Filled 2023-10-01: qty 10

## 2023-10-01 MED ORDER — SODIUM CHLORIDE 0.9 % IV SOLN
INTRAVENOUS | Status: DC | PRN
  Administered 2023-10-01: 1000 mL via INTRAMUSCULAR

## 2023-10-01 MED ORDER — MIDAZOLAM HCL 2 MG/2ML IJ SOLN
INTRAMUSCULAR | Status: DC | PRN
  Administered 2023-10-01: 1 mg via INTRAVENOUS
  Administered 2023-10-01: 2 mg via INTRAVENOUS
  Administered 2023-10-01: 1 mg via INTRAVENOUS

## 2023-10-01 MED ORDER — BUPIVACAINE HCL (PF) 0.5 % IJ SOLN
INTRAMUSCULAR | Status: DC | PRN
Start: 2023-10-01 — End: 2023-10-01
  Administered 2023-10-01: 2 mL

## 2023-10-01 MED ORDER — DIPHENHYDRAMINE HCL 12.5 MG/5ML PO ELIX: 12.5000 mg | ORAL_SOLUTION | ORAL | Status: DC | PRN

## 2023-10-01 MED ORDER — VANCOMYCIN HCL 1750 MG/350ML IV SOLN
1750.0000 mg | INTRAVENOUS | Status: DC
  Administered 2023-10-02: 1750 mg via INTRAVENOUS
  Filled 2023-10-01: qty 350

## 2023-10-01 MED ORDER — PHENYLEPHRINE HCL-NACL 20-0.9 MG/250ML-% IV SOLN
0.0000 ug/min | INTRAVENOUS | Status: DC
  Administered 2023-10-01: 20 ug/min via INTRAVENOUS

## 2023-10-01 MED ORDER — ACETAMINOPHEN 10 MG/ML IV SOLN: 1000.0000 mg | Freq: Once | INTRAVENOUS | Status: DC | PRN

## 2023-10-01 MED ORDER — KETOROLAC TROMETHAMINE 30 MG/ML IJ SOLN
INTRAMUSCULAR | Status: AC
  Filled 2023-10-01: qty 1

## 2023-10-01 MED ORDER — OXYCODONE HCL 5 MG PO TABS
10.0000 mg | ORAL_TABLET | ORAL | Status: DC | PRN
  Administered 2023-10-01 – 2023-10-04 (×4): 10 mg via ORAL
  Filled 2023-10-01: qty 2
  Filled 2023-10-01: qty 3
  Filled 2023-10-01: qty 2

## 2023-10-01 MED ORDER — VANCOMYCIN HCL 1000 MG IV SOLR
INTRAVENOUS | Status: AC
  Filled 2023-10-01: qty 100

## 2023-10-01 MED ORDER — AMOXICILLIN 500 MG PO CAPS
1000.0000 mg | ORAL_CAPSULE | Freq: Two times a day (BID) | ORAL | Status: DC
  Administered 2023-10-01 – 2023-10-02 (×2): 1000 mg via ORAL
  Filled 2023-10-01 (×2): qty 2

## 2023-10-01 MED ORDER — BUPIVACAINE-EPINEPHRINE (PF) 0.25% -1:200000 IJ SOLN
INTRAMUSCULAR | Status: DC | PRN
  Administered 2023-10-01: 30 mL via PERINEURAL

## 2023-10-01 MED ORDER — ACETAMINOPHEN 325 MG PO TABS
325.0000 mg | ORAL_TABLET | Freq: Four times a day (QID) | ORAL | Status: DC | PRN
  Administered 2023-10-02 – 2023-10-04 (×5): 650 mg via ORAL
  Filled 2023-10-01 (×5): qty 2

## 2023-10-01 MED ORDER — CEFAZOLIN SODIUM-DEXTROSE 2-4 GM/100ML-% IV SOLN
2.0000 g | INTRAVENOUS | Status: DC
  Filled 2023-10-01: qty 100

## 2023-10-01 MED ORDER — VANCOMYCIN HCL 1000 MG IV SOLR
INTRAVENOUS | Status: DC | PRN
  Administered 2023-10-01: 1000 mg via INTRAVENOUS

## 2023-10-01 MED ORDER — PHENYLEPHRINE HCL (PRESSORS) 10 MG/ML IV SOLN
INTRAVENOUS | Status: AC
  Filled 2023-10-01: qty 1

## 2023-10-01 MED ORDER — ALBUMIN HUMAN 5 % IV SOLN
INTRAVENOUS | Status: AC
  Filled 2023-10-01: qty 250

## 2023-10-01 MED ORDER — VASOPRESSIN 20 UNIT/ML IV SOLN
INTRAVENOUS | Status: DC | PRN
Start: 2023-10-01 — End: 2023-10-01
  Administered 2023-10-01: 1 [IU] via INTRAVENOUS

## 2023-10-01 MED ORDER — VANCOMYCIN HCL 1000 MG IV SOLR
INTRAVENOUS | Status: AC
  Filled 2023-10-01: qty 40

## 2023-10-01 MED ORDER — ONDANSETRON HCL 4 MG/2ML IJ SOLN
INTRAMUSCULAR | Status: DC | PRN
  Administered 2023-10-01: 4 mg via INTRAVENOUS

## 2023-10-01 MED ORDER — ONDANSETRON HCL 4 MG PO TABS: 4.0000 mg | ORAL_TABLET | Freq: Four times a day (QID) | ORAL | Status: DC | PRN

## 2023-10-01 MED ORDER — VANCOMYCIN HCL 1000 MG IV SOLR
INTRAVENOUS | Status: DC | PRN
Start: 2023-10-01 — End: 2023-10-01
  Administered 2023-10-01: 20 g

## 2023-10-01 MED ORDER — METOCLOPRAMIDE HCL 5 MG PO TABS: 5.0000 mg | ORAL_TABLET | Freq: Three times a day (TID) | ORAL | Status: DC | PRN

## 2023-10-01 MED ORDER — ALBUMIN HUMAN 5 % IV SOLN: 12.5000 g | Freq: Once | INTRAVENOUS | Status: AC

## 2023-10-01 MED ORDER — SENNA 8.6 MG PO TABS
1.0000 | ORAL_TABLET | Freq: Two times a day (BID) | ORAL | Status: DC
  Filled 2023-10-01 (×5): qty 1

## 2023-10-01 MED ORDER — METHOCARBAMOL 500 MG PO TABS
500.0000 mg | ORAL_TABLET | Freq: Four times a day (QID) | ORAL | Status: DC | PRN
  Administered 2023-10-02 (×2): 500 mg via ORAL
  Filled 2023-10-01 (×3): qty 1

## 2023-10-01 MED ORDER — HYDROMORPHONE HCL 1 MG/ML IJ SOLN: 0.2500 mg | INTRAMUSCULAR | Status: DC | PRN

## 2023-10-01 MED ORDER — DEXAMETHASONE SODIUM PHOSPHATE 10 MG/ML IJ SOLN
INTRAMUSCULAR | Status: DC | PRN
Start: 2023-10-01 — End: 2023-10-01
  Administered 2023-10-01: 10 mg via INTRAVENOUS

## 2023-10-01 MED ORDER — CEFAZOLIN SODIUM-DEXTROSE 2-4 GM/100ML-% IV SOLN
2.0000 g | Freq: Four times a day (QID) | INTRAVENOUS | Status: AC
  Administered 2023-10-01 – 2023-10-02 (×2): 2 g via INTRAVENOUS
  Filled 2023-10-01 (×2): qty 100

## 2023-10-01 MED ORDER — SODIUM CHLORIDE (PF) 0.9 % IJ SOLN
INTRAMUSCULAR | Status: DC | PRN
  Administered 2023-10-01: 30 mL via INTRAVENOUS

## 2023-10-01 MED ORDER — ASPIRIN 81 MG PO CHEW
81.0000 mg | CHEWABLE_TABLET | Freq: Two times a day (BID) | ORAL | Status: DC
  Administered 2023-10-01 – 2023-10-04 (×6): 81 mg via ORAL
  Filled 2023-10-01 (×6): qty 1

## 2023-10-01 MED ORDER — VANCOMYCIN HCL 1000 MG IV SOLR
INTRAVENOUS | Status: AC
  Filled 2023-10-01: qty 80

## 2023-10-01 MED ORDER — LIDOCAINE HCL (CARDIAC) PF 100 MG/5ML IV SOSY
PREFILLED_SYRINGE | INTRAVENOUS | Status: DC | PRN
  Administered 2023-10-01: 50 mg via INTRAVENOUS

## 2023-10-01 MED ORDER — PHENYLEPHRINE HCL-NACL 20-0.9 MG/250ML-% IV SOLN
INTRAVENOUS | Status: AC
  Filled 2023-10-01: qty 250

## 2023-10-01 MED ORDER — 0.9 % SODIUM CHLORIDE (POUR BTL) OPTIME
TOPICAL | Status: DC | PRN
  Administered 2023-10-01: 1000 mL

## 2023-10-01 MED ORDER — VASOPRESSIN 20 UNIT/ML IV SOLN
INTRAVENOUS | Status: AC
  Filled 2023-10-01: qty 1

## 2023-10-01 MED ORDER — PHENYLEPHRINE HCL-NACL 20-0.9 MG/250ML-% IV SOLN
INTRAVENOUS | Status: DC | PRN
  Administered 2023-10-01: 50 ug/min via INTRAVENOUS

## 2023-10-01 MED ORDER — VANCOMYCIN HCL IN DEXTROSE 1-5 GM/200ML-% IV SOLN
1000.0000 mg | Freq: Once | INTRAVENOUS | Status: AC
  Administered 2023-10-01: 1000 mg via INTRAVENOUS
  Filled 2023-10-01: qty 200

## 2023-10-01 MED ORDER — ISOPROPYL ALCOHOL 70 % SOLN
Status: AC
  Filled 2023-10-01: qty 480

## 2023-10-01 MED ORDER — ONDANSETRON HCL 4 MG/2ML IJ SOLN: 4.0000 mg | Freq: Four times a day (QID) | INTRAMUSCULAR | Status: DC | PRN

## 2023-10-01 MED ORDER — PHENYLEPHRINE HCL-NACL 20-0.9 MG/250ML-% IV SOLN: 25.0000 ug/min | INTRAVENOUS | Status: DC

## 2023-10-01 MED ORDER — SODIUM CHLORIDE 0.9 % IV SOLN: INTRAVENOUS | Status: DC

## 2023-10-01 MED ORDER — ONDANSETRON HCL 4 MG/2ML IJ SOLN: 4.0000 mg | Freq: Once | INTRAMUSCULAR | Status: DC | PRN

## 2023-10-01 MED ORDER — METHOCARBAMOL 1000 MG/10ML IJ SOLN: 500.0000 mg | Freq: Four times a day (QID) | INTRAVENOUS | Status: DC | PRN

## 2023-10-01 MED ORDER — OXYCODONE HCL 5 MG PO TABS
5.0000 mg | ORAL_TABLET | ORAL | Status: DC | PRN
  Administered 2023-10-01 – 2023-10-03 (×8): 10 mg via ORAL
  Filled 2023-10-01 (×9): qty 2

## 2023-10-01 MED ORDER — ROSUVASTATIN CALCIUM 20 MG PO TABS
20.0000 mg | ORAL_TABLET | Freq: Every day | ORAL | Status: DC
  Administered 2023-10-01 – 2023-10-04 (×4): 20 mg via ORAL
  Filled 2023-10-01 (×4): qty 1

## 2023-10-01 MED ORDER — ROPIVACAINE HCL 5 MG/ML IJ SOLN
INTRAMUSCULAR | Status: DC | PRN
Start: 2023-10-01 — End: 2023-10-01
  Administered 2023-10-01: 30 mL via EPIDURAL

## 2023-10-01 MED ORDER — ACETAMINOPHEN 500 MG PO TABS
1000.0000 mg | ORAL_TABLET | Freq: Once | ORAL | Status: AC
  Administered 2023-10-01: 1000 mg via ORAL
  Filled 2023-10-01: qty 2

## 2023-10-01 MED ORDER — OXYCODONE HCL 5 MG PO TABS
5.0000 mg | ORAL_TABLET | Freq: Once | ORAL | Status: AC | PRN
  Administered 2023-10-01: 5 mg via ORAL

## 2023-10-01 MED ORDER — OXYCODONE HCL 5 MG PO TABS
ORAL_TABLET | ORAL | Status: AC
  Administered 2023-10-01: 5 mg via ORAL
  Filled 2023-10-01: qty 1

## 2023-10-01 MED ORDER — ALBUMIN HUMAN 5 % IV SOLN
INTRAVENOUS | Status: DC | PRN
Start: 2023-10-01 — End: 2023-10-01

## 2023-10-01 MED ORDER — EPHEDRINE SULFATE (PRESSORS) 50 MG/ML IJ SOLN
INTRAMUSCULAR | Status: DC | PRN
Start: 2023-10-01 — End: 2023-10-01
  Administered 2023-10-01 (×2): 5 mg via INTRAVENOUS
  Administered 2023-10-01: 10 mg via INTRAVENOUS

## 2023-10-01 MED ORDER — MENTHOL 3 MG MT LOZG: 1.0000 | LOZENGE | OROMUCOSAL | Status: DC | PRN

## 2023-10-01 MED ORDER — DEXAMETHASONE SODIUM PHOSPHATE 10 MG/ML IJ SOLN
INTRAMUSCULAR | Status: DC | PRN
  Administered 2023-10-01: 10 mg

## 2023-10-01 MED ORDER — AMISULPRIDE (ANTIEMETIC) 5 MG/2ML IV SOLN: 10.0000 mg | Freq: Once | INTRAVENOUS | Status: DC | PRN

## 2023-10-01 MED ORDER — PROPOFOL 10 MG/ML IV BOLUS
INTRAVENOUS | Status: DC | PRN
  Administered 2023-10-01 (×2): 30 mg via INTRAVENOUS
  Administered 2023-10-01: 50 mg via INTRAVENOUS

## 2023-10-01 MED ORDER — ORAL CARE MOUTH RINSE: 15.0000 mL | Freq: Once | OROMUCOSAL | Status: AC

## 2023-10-01 MED ORDER — DOCUSATE SODIUM 100 MG PO CAPS
100.0000 mg | ORAL_CAPSULE | Freq: Two times a day (BID) | ORAL | Status: DC
  Filled 2023-10-01 (×5): qty 1

## 2023-10-01 MED ORDER — OXYCODONE HCL 5 MG/5ML PO SOLN: 5.0000 mg | Freq: Once | ORAL | Status: AC | PRN

## 2023-10-01 MED ORDER — FENTANYL CITRATE (PF) 100 MCG/2ML IJ SOLN
INTRAMUSCULAR | Status: AC
  Filled 2023-10-01: qty 2

## 2023-10-01 MED ORDER — DEXAMETHASONE SODIUM PHOSPHATE 10 MG/ML IJ SOLN
INTRAMUSCULAR | Status: AC
  Filled 2023-10-01: qty 1

## 2023-10-01 MED ORDER — LIDOCAINE HCL (PF) 2 % IJ SOLN
INTRAMUSCULAR | Status: AC
  Filled 2023-10-01: qty 5

## 2023-10-01 MED ORDER — LACTATED RINGERS IV BOLUS
500.0000 mL | Freq: Once | INTRAVENOUS | Status: AC
  Administered 2023-10-01: 500 mL via INTRAVENOUS

## 2023-10-01 MED ORDER — PANTOPRAZOLE SODIUM 40 MG PO TBEC
40.0000 mg | DELAYED_RELEASE_TABLET | Freq: Every day | ORAL | Status: DC
  Administered 2023-10-01 – 2023-10-04 (×4): 40 mg via ORAL
  Filled 2023-10-01 (×4): qty 1

## 2023-10-01 MED ORDER — GLYCOPYRROLATE 0.2 MG/ML IJ SOLN
INTRAMUSCULAR | Status: DC | PRN
Start: 2023-10-01 — End: 2023-10-01
  Administered 2023-10-01: .2 mg via INTRAVENOUS

## 2023-10-01 MED ORDER — BISACODYL 10 MG RE SUPP: 10.0000 mg | Freq: Every day | RECTAL | Status: DC | PRN

## 2023-10-01 MED ORDER — ALBUMIN HUMAN 5 % IV SOLN
12.5000 g | Freq: Once | INTRAVENOUS | Status: AC
  Administered 2023-10-01: 12.5 g via INTRAVENOUS

## 2023-10-01 MED ORDER — VENLAFAXINE HCL 37.5 MG PO TABS
37.5000 mg | ORAL_TABLET | Freq: Two times a day (BID) | ORAL | Status: DC
  Administered 2023-10-01 – 2023-10-04 (×6): 37.5 mg via ORAL
  Filled 2023-10-01 (×7): qty 1

## 2023-10-01 MED ORDER — PROPOFOL 500 MG/50ML IV EMUL
INTRAVENOUS | Status: DC | PRN
  Administered 2023-10-01: 50 ug/kg/min via INTRAVENOUS

## 2023-10-01 MED ORDER — BUPIVACAINE HCL (PF) 0.5 % IJ SOLN
INTRAMUSCULAR | Status: DC | PRN
Start: 2023-10-01 — End: 2023-10-01
  Administered 2023-10-01: 14 mg via INTRATHECAL

## 2023-10-01 MED ORDER — OXYCODONE HCL 5 MG PO TABS
ORAL_TABLET | ORAL | Status: AC
  Filled 2023-10-01: qty 1

## 2023-10-01 MED ORDER — BUPIVACAINE-EPINEPHRINE 0.25% -1:200000 IJ SOLN
INTRAMUSCULAR | Status: AC
  Filled 2023-10-01: qty 1

## 2023-10-01 MED ORDER — STERILE WATER FOR IRRIGATION IR SOLN
Status: DC | PRN
  Administered 2023-10-01: 2000 mL

## 2023-10-01 MED ORDER — ACETAMINOPHEN 500 MG PO TABS
1000.0000 mg | ORAL_TABLET | Freq: Four times a day (QID) | ORAL | Status: AC
  Administered 2023-10-01 – 2023-10-02 (×3): 1000 mg via ORAL
  Filled 2023-10-01 (×3): qty 2

## 2023-10-01 MED ORDER — CHLORHEXIDINE GLUCONATE 0.12 % MT SOLN
15.0000 mL | Freq: Once | OROMUCOSAL | Status: AC
  Administered 2023-10-01: 15 mL via OROMUCOSAL

## 2023-10-01 MED ORDER — PHENYLEPHRINE HCL (PRESSORS) 10 MG/ML IV SOLN
INTRAVENOUS | Status: DC | PRN
Start: 2023-10-01 — End: 2023-10-01
  Administered 2023-10-01 (×2): 160 ug via INTRAVENOUS

## 2023-10-01 MED ORDER — SODIUM CHLORIDE 0.9 % IR SOLN
Status: DC | PRN
  Administered 2023-10-01: 3000 mL via INTRAVESICAL
  Administered 2023-10-01 (×2): 3000 mL

## 2023-10-01 MED ORDER — PHENOL 1.4 % MT LIQD: 1.0000 | OROMUCOSAL | Status: DC | PRN

## 2023-10-01 MED ORDER — EZETIMIBE 10 MG PO TABS
10.0000 mg | ORAL_TABLET | Freq: Every day | ORAL | Status: DC
  Administered 2023-10-02 – 2023-10-04 (×3): 10 mg via ORAL
  Filled 2023-10-01 (×3): qty 1

## 2023-10-01 MED ORDER — CLONIDINE HCL (ANALGESIA) 100 MCG/ML EP SOLN
EPIDURAL | Status: DC | PRN
Start: 2023-10-01 — End: 2023-10-01
  Administered 2023-10-01: 100 ug

## 2023-10-01 MED ORDER — TRANEXAMIC ACID-NACL 1000-0.7 MG/100ML-% IV SOLN
1000.0000 mg | INTRAVENOUS | Status: AC
  Administered 2023-10-01: 1000 mg via INTRAVENOUS
  Filled 2023-10-01: qty 100

## 2023-10-01 MED ORDER — KETOROLAC TROMETHAMINE 30 MG/ML IJ SOLN
INTRAMUSCULAR | Status: DC | PRN
  Administered 2023-10-01: 30 mg

## 2023-10-01 MED ORDER — PROPOFOL 1000 MG/100ML IV EMUL
INTRAVENOUS | Status: AC
  Filled 2023-10-01: qty 100

## 2023-10-01 MED ORDER — OXYCODONE HCL 5 MG PO TABS: 5.0000 mg | ORAL_TABLET | Freq: Once | ORAL | Status: AC

## 2023-10-01 MED ORDER — POLYETHYLENE GLYCOL 3350 17 G PO PACK: 17.0000 g | PACK | Freq: Every day | ORAL | Status: DC | PRN

## 2023-10-01 MED ORDER — ISOPROPYL ALCOHOL 70 % SOLN
Status: DC | PRN
  Administered 2023-10-01: 1 via TOPICAL

## 2023-10-01 SURGICAL SUPPLY — 91 items
ADH SKN CLS APL DERMABOND .7 (GAUZE/BANDAGES/DRESSINGS) ×2
APL PRP STRL LF DISP 70% ISPRP (MISCELLANEOUS) ×2
BAG COUNTER SPONGE SURGICOUNT (BAG) ×2 IMPLANT
BAG SPEC THK2 15X12 ZIP CLS (MISCELLANEOUS) ×1
BAG SPNG CNTER NS LX DISP (BAG) ×1
BAG ZIPLOCK 12X15 (MISCELLANEOUS) ×2 IMPLANT
BLADE OSTEOTOME VER RD 11X100 (ORTHOPEDIC DISPOSABLE SUPPLIES) IMPLANT
BLADE SAG 18X100X1.27 (BLADE) ×2 IMPLANT
BLADE SAW RECIPROCATING 77.5 (BLADE) IMPLANT
BLADE SAW SGTL 81X20 HD (BLADE) ×2 IMPLANT
BLADE SURG SZ10 CARB STEEL (BLADE) IMPLANT
BNDG CMPR 5X4 KNIT ELC UNQ LF (GAUZE/BANDAGES/DRESSINGS) ×1
BNDG CMPR 6 X 5 YARDS HK CLSR (GAUZE/BANDAGES/DRESSINGS) ×1
BNDG CMPR MED 10X6 ELC LF (GAUZE/BANDAGES/DRESSINGS) ×1
BNDG ELASTIC 4INX 5YD STR LF (GAUZE/BANDAGES/DRESSINGS) ×2 IMPLANT
BNDG ELASTIC 6INX 5YD STR LF (GAUZE/BANDAGES/DRESSINGS) ×2 IMPLANT
BNDG ELASTIC 6X10 VLCR STRL LF (GAUZE/BANDAGES/DRESSINGS) IMPLANT
BOWL SMART MIX CTS (DISPOSABLE) ×4 IMPLANT
BUR OVAL CARBIDE 4.0 (BURR) ×2 IMPLANT
CEMENT BONE R 1X40 (Cement) IMPLANT
CEMENT BONE REFOBACIN R1X40 US (Cement) IMPLANT
CHLORAPREP W/TINT 26 (MISCELLANEOUS) ×4 IMPLANT
CNTNR URN SCR LID CUP LEK RST (MISCELLANEOUS) IMPLANT
CONT SPEC 4OZ STRL OR WHT (MISCELLANEOUS) ×1
COVER SURGICAL LIGHT HANDLE (MISCELLANEOUS) ×2 IMPLANT
CUFF TOURN SGL QUICK 34 (TOURNIQUET CUFF) ×1
CUFF TRNQT CYL 34X4.125X (TOURNIQUET CUFF) ×2 IMPLANT
DERMABOND ADVANCED .7 DNX12 (GAUZE/BANDAGES/DRESSINGS) ×2 IMPLANT
DRAIN JP 10F RND SILICONE (MISCELLANEOUS) IMPLANT
DRAPE INCISE IOBAN 66X45 STRL (DRAPES) ×2 IMPLANT
DRAPE SHEET LG 3/4 BI-LAMINATE (DRAPES) ×6 IMPLANT
DRAPE U-SHAPE 47X51 STRL (DRAPES) ×2 IMPLANT
DRSG AQUACEL AG ADV 3.5X10 (GAUZE/BANDAGES/DRESSINGS) ×2 IMPLANT
ELECT BLADE TIP CTD 4 INCH (ELECTRODE) ×2 IMPLANT
ELECT REM PT RETURN 15FT ADLT (MISCELLANEOUS) ×2 IMPLANT
EVACUATOR 1/8 PVC DRAIN (DRAIN) IMPLANT
EVACUATOR DRAINAGE 10X20 100CC (DRAIN) IMPLANT
EVACUATOR SILICONE 100CC (DRAIN)
GAUZE SPONGE 4X4 12PLY STRL (GAUZE/BANDAGES/DRESSINGS) ×2 IMPLANT
GLOVE BIO SURGEON STRL SZ8.5 (GLOVE) ×4 IMPLANT
GLOVE BIOGEL M 7.0 STRL (GLOVE) ×2 IMPLANT
GLOVE BIOGEL PI IND STRL 7.0 (GLOVE) ×2 IMPLANT
GLOVE BIOGEL PI IND STRL 7.5 (GLOVE) ×2 IMPLANT
GLOVE BIOGEL PI IND STRL 8.5 (GLOVE) ×2 IMPLANT
GOWN SPEC L3 XXLG W/TWL (GOWN DISPOSABLE) ×2 IMPLANT
GOWN SPEC L4 XLG W/TWL (GOWN DISPOSABLE) ×2 IMPLANT
HANDPIECE INTERPULSE COAX TIP (DISPOSABLE) ×2
HOOD PEEL AWAY T7 (MISCELLANEOUS) ×6 IMPLANT
IMMOBILIZER KNEE 20 (SOFTGOODS) ×1
IMMOBILIZER KNEE 20 THIGH 36 (SOFTGOODS) IMPLANT
JET LAVAGE IRRISEPT WOUND (IRRIGATION / IRRIGATOR) ×3
KIT DRSG PREVENA PLUS 7DAY 125 (MISCELLANEOUS) IMPLANT
KIT TURNOVER KIT A (KITS) IMPLANT
LAVAGE JET IRRISEPT WOUND (IRRIGATION / IRRIGATOR) ×6 IMPLANT
MANIFOLD NEPTUNE II (INSTRUMENTS) ×2 IMPLANT
MARKER SKIN DUAL TIP RULER LAB (MISCELLANEOUS) ×2 IMPLANT
MOLD CEMENT FEMORAL 70MM (DISPOSABLE) IMPLANT
MOLD CEMENT TIBIAL 75MM (DISPOSABLE) IMPLANT
NDL SPNL 18GX3.5 QUINCKE PK (NEEDLE) ×2 IMPLANT
NEEDLE SPNL 18GX3.5 QUINCKE PK (NEEDLE) ×1
NS IRRIG 1000ML POUR BTL (IV SOLUTION) ×4 IMPLANT
PACK TOTAL KNEE CUSTOM (KITS) ×2 IMPLANT
PADDING CAST COTTON 6X4 STRL (CAST SUPPLIES) ×2 IMPLANT
PIN DRILL HDLS TROCAR 75 4PK (PIN) IMPLANT
PROTECTOR NERVE ULNAR (MISCELLANEOUS) ×2 IMPLANT
SAW OSC TIP CART 19.5X105X1.3 (SAW) ×2 IMPLANT
SEALER BIPOLAR AQUA 6.0 (INSTRUMENTS) ×2 IMPLANT
SET HNDPC FAN SPRY TIP SCT (DISPOSABLE) ×2 IMPLANT
SET PAD KNEE POSITIONER (MISCELLANEOUS) ×2 IMPLANT
SOLUTION PRONTOSAN WOUND 350ML (IRRIGATION / IRRIGATOR) IMPLANT
STAPLER VISISTAT 35W (STAPLE) IMPLANT
SUT MNCRL AB 3-0 PS2 18 (SUTURE) ×2 IMPLANT
SUT MNCRL AB 4-0 PS2 18 (SUTURE) ×2 IMPLANT
SUT MON AB 2-0 CT1 36 (SUTURE) ×2 IMPLANT
SUT NYLON 3 0 (SUTURE) IMPLANT
SUT PDS AB 1 CTX 36 (SUTURE) IMPLANT
SUT STRATAFIX PDO 1 14 VIOLET (SUTURE) ×1
SUT STRATFX PDO 1 14 VIOLET (SUTURE) ×1
SUT VIC AB 1 CTX 36 (SUTURE) ×2
SUT VIC AB 1 CTX36XBRD ANBCTR (SUTURE) ×4 IMPLANT
SUT VIC AB 2-0 CT1 27 (SUTURE) ×1
SUT VIC AB 2-0 CT1 TAPERPNT 27 (SUTURE) ×2 IMPLANT
SUTURE STRATFX PDO 1 14 VIOLET (SUTURE) ×2 IMPLANT
SWAB COLLECTION DEVICE MRSA (MISCELLANEOUS) IMPLANT
SWAB CULTURE ESWAB REG 1ML (MISCELLANEOUS) IMPLANT
TOWER CARTRIDGE SMART MIX (DISPOSABLE) IMPLANT
TRAY FOLEY MTR SLVR 16FR STAT (SET/KITS/TRAYS/PACK) ×2 IMPLANT
TUBE SUCTION HIGH CAP CLEAR NV (SUCTIONS) ×2 IMPLANT
VDESTAFEE IMPLANT
WATER STERILE IRR 1000ML POUR (IV SOLUTION) ×2 IMPLANT
WRAP KNEE MAXI GEL POST OP (GAUZE/BANDAGES/DRESSINGS) ×2 IMPLANT

## 2023-10-01 NOTE — Transfer of Care (Signed)
Immediate Anesthesia Transfer of Care Note  Patient: Tyler Gates  Procedure(s) Performed: EXCISIONAL TOTAL KNEE ARTHROPLASTY WITH PLACEMENT OF ARTICULATING ANTIBIOTIC SPACER (Right: Knee)  Patient Location: PACU  Anesthesia Type:MAC, Spinal, and MAC combined with regional for post-op pain  Level of Consciousness: awake, oriented, patient cooperative, and responds to stimulation  Airway & Oxygen Therapy: Patient Spontanous Breathing and Patient connected to face mask oxygen  Post-op Assessment: Report given to RN, Post -op Vital signs reviewed and stable, and Patient moving all extremities X 4  Post vital signs: Reviewed and stable  Last Vitals:  Vitals Value Taken Time  BP 102/91 10/01/23 1330  Temp    Pulse 105 10/01/23 1333  Resp 15 10/01/23 1333  SpO2 89 % 10/01/23 1333  Vitals shown include unfiled device data.  Last Pain:  Vitals:   10/01/23 0702  TempSrc: Oral  PainSc:          Complications: No notable events documented.

## 2023-10-01 NOTE — Anesthesia Procedure Notes (Signed)
Anesthesia Regional Block: Adductor canal block   Pre-Anesthetic Checklist: , timeout performed,  Correct Patient, Correct Site, Correct Laterality,  Correct Procedure, Correct Position, site marked,  Risks and benefits discussed,  Surgical consent,  Pre-op evaluation,  At surgeon's request and post-op pain management  Laterality: Lower and Right  Prep: chloraprep       Needles:  Injection technique: Single-shot  Needle Type: Echogenic Needle     Needle Length: 9cm  Needle Gauge: 22     Additional Needles:   Procedures:,,,, ultrasound used (permanent image in chart),,    Narrative:  Start time: 10/01/2023 8:05 AM End time: 10/01/2023 8:12 AM Injection made incrementally with aspirations every 5 mL.  Performed by: Personally  Anesthesiologist: Trevor Iha, MD  Additional Notes: Block assessed prior to surgery. Pt tolerated procedure well.

## 2023-10-01 NOTE — Anesthesia Procedure Notes (Signed)
Spinal  Patient location during procedure: OR Start time: 10/01/2023 8:34 AM End time: 10/01/2023 8:43 AM Reason for block: surgical anesthesia Staffing Performed: anesthesiologist  Anesthesiologist: Trevor Iha, MD Performed by: Trevor Iha, MD Authorized by: Trevor Iha, MD   Preanesthetic Checklist Completed: patient identified, IV checked, risks and benefits discussed, surgical consent, monitors and equipment checked, pre-op evaluation and timeout performed Spinal Block Patient position: sitting Prep: DuraPrep and site prepped and draped Patient monitoring: heart rate, cardiac monitor, continuous pulse ox and blood pressure Approach: midline Location: L3-4 Injection technique: single-shot Needle Needle type: Pencan  Needle gauge: 24 G Needle length: 10 cm Needle insertion depth: 6 cm Assessment Sensory level: T4 Events: CSF return Additional Notes 4 Attempt (s). Pt tolerated procedure well.

## 2023-10-01 NOTE — Op Note (Signed)
OPERATIVE REPORT   10/01/2023  1:02 PM  PATIENT:  Tyler Gates   SURGEON:  Jonette Pesa, MD  ASSISTANT:  Clint Bolder, PA-C.   PREOPERATIVE DIAGNOSIS: Periprosthetic joint infection right knee.  POSTOPERATIVE DIAGNOSIS:  Same.  PROCEDURE:  1.  Resection right total knee arthroplasty with placement of articulating antibiotic impregnated spacer. 2. Application of negative pressure incisional dressing.  ANESTHESIA:   Spinal. Regional. MAC.  ANTIBIOTICS: 2 g Ancef. 1 g vancomycin.  IMPLANTS: Biomet stage I articulating spacer, size 70 femoral component, size 75 tibial component. Refobacin bone cement with 2 g of vancomycin per pack.  EXPLANTS: Zimmer persona press-fit femur, size 9 CR. Persona Osseo Ti tibia, size F. 38 mm TM patella. 10 mm CR poly liner.  TUBES AND DRAINS: 1. Medium HV x1. 2. Prevena negative pressure dressing at 75 mmHg.  SPECIMENS: Right knee pseudocapsule for aerobic and anaerobic tissue culture.  COMPLICATIONS: None.  DISPOSITION: Stable to PACU.  SURGICAL INDICATIONS:  RAJON Gates is a 67 y.o. male who underwent primary right total knee arthroplasty on 04/23/2023.  He has a history of Crohn's disease on Remicade.  He was doing well until about a month ago, when his knee started swelling.  I saw him in the office and aspirated his knee.  Synovial WBC 37,626 with 88% neutrophils.  Alpha defensin positive.  Synovial microbial ID panel positive for Staphylococcus.  Culture positive for Staphylococcus epidermidis.  We discussed that in order to cure the infection, the gold standard is a two-stage procedure.  He was indicated for resection right total knee arthroplasty with placement of articulating antibiotic spacer.  We discussed the risk, benefits, and alternatives, and he elected to proceed.  PROCEDURE IN DETAIL: The patient was identified in the holding area using 2 identifiers.  The surgical site was marked by myself.  Regional block was placed  by anesthesia.  He was taken to the operating room, and spinal anesthesia was obtained.  He was placed supine on the operating room table.  A tourniquet was applied to the upper thigh.  All bony prominences were well-padded.  A bump was placed under the right hip.  The right lower extremity was prepped and draped in the normal sterile surgical fashion.  Timeout was called, verifying side and site of surgery.  He did receive IV antibiotics within 60 minutes of beginning the procedure.  Gravity exsanguination was utilized, and the tourniquet was elevated to 320 mmHg.  Using a #10 blade, I sharply excised his previous anterior scar.  Full-thickness skin flaps were created.  A medial parapatellar arthrotomy was made.  The joint fluid was straw-colored and grossly purulent.  Medial release was performed.  The knee was brought into extension.  I performed a radical synovectomy of the suprapatellar pouch, lateral gutter, and medial gutter.  There was evidence of chronic inflammation of the synovium.  A representative sample was sent for aerobic and anaerobic tissue culture.  The infrapatellar scar was sharply excised with Bovie electrocautery.  His components were well-fixed.  ACL saw blade was used to disrupt the implant/bone interface of the proximal tibial component and distal femoral component.  The versa drive pneumatic osteotome was used to further disrupt the bone implant interface.  I removed the poly liner with an osteotome.  The Shukla extraction device was used to remove the femoral component without any significant bone loss.  This Shukla extraction device was then used to remove the proximal tibial component without any significant bone loss.  Using an entry drill, I gained access to the intramedullary canals of the proximal tibia and distal femur.  An extramedullary cutting guide was used to fraction the tibial cut.  I debrided the distal femur with a curette.  I sequentially reamed up to 12 mm on the  distal femur and proximal tibia.  I sized the femur to a size 70 femoral component.  I sized the tibia to a size 75 tibial component.  A 10 mm poly liner was utilized.  The knee had excellent varus/valgus stability throughout the range of motion.  The flexion and extension gaps were perfectly balanced.  I performed a radical synovectomy of the posterior compartment.  The knee was copiously irrigated with over 6 L of normal saline using pulsatile lavage.  The tourniquet was let down, and meticulous hemostasis was achieved with the aqua mantis and Bovie electrocautery.  On the back table, plain cement and vancomycin powder were mixed to create intramedullary dowels for the distal femur and proximal tibia.  The tibial and femoral molds were filled plain cement mixed with with vancomycin powder.  Once the molds were fully cured, the components were removed from the molds.  The dowels were placed within the distal femur and proximal tibia.  An additional pack of cement with vancomycin powder was mixed to cement in the spacer molds.  Excess cement was cleared.  The knee was brought into extension while the cement polymerized.  The knee was irrigated for a final time.  A medium Hemovac drain was placed into the lateral gutter.  The arthrotomy was closed with #1 PDS and #1 strata fix suture.  The deep dermal layer was closed with 2-0 Monocryl suture.  The skin was reapproximated with staples.  A customizable Prevena negative pressure dressing was placed, and suction was hooked up to 75 mmHg.  There was excellent seal without any leak.  A bulky dressing was placed.  A knee immobilizer was placed.  The patient was then awakened from anesthesia and taken to the PACU in stable condition.  Sponge, needle, and instrument counts were correct at the end of the case x 2.  There were no known complications.  POSTOPERATIVE PLAN: Postoperatively, the patient will be admitted to the orthopedic floor.  Maintain knee immobilizer while  out of bed.  50% weightbearing right lower extremity with walker.  Begin aspirin for DVT prophylaxis.  I will place him on IV vancomycin for now.  We will obtain an infectious disease consult for antibiotic selection and duration.  Anticipate need for PICC line for long-term IV antibiotics.  I placed a copy of his Synovasure analysis on the physical chart.  We will discontinue the Hemovac drain when output is less than 30 cc per shift.  Upon discharge, the house VAC suction unit will be exchanged for a portable Prevena suction unit.  He will need to follow-up in the office within 7 days of discharge for removal of his negative pressure dressing.

## 2023-10-01 NOTE — Interval H&P Note (Signed)
History and Physical Interval Note:  10/01/2023 7:34 AM  Tyler Gates  has presented today for surgery, with the diagnosis of Right knee post joint infection.  The various methods of treatment have been discussed with the patient and family. After consideration of risks, benefits and other options for treatment, the patient has consented to  Procedure(s): EXCISIONAL TOTAL KNEE ARTHROPLASTY WITH PLACEMENT OF ARTICULATING ANTIBIOTIC SPACER (Right) as a surgical intervention.  The patient's history has been reviewed, patient examined, no change in status, stable for surgery.  I have reviewed the patient's chart and labs.  Questions were answered to the patient's satisfaction.     Iline Oven Brandin Dilday

## 2023-10-01 NOTE — Anesthesia Postprocedure Evaluation (Signed)
Anesthesia Post Note  Patient: Tyler Gates  Procedure(s) Performed: EXCISIONAL TOTAL KNEE ARTHROPLASTY WITH PLACEMENT OF ARTICULATING ANTIBIOTIC SPACER (Right: Knee)     Patient location during evaluation: Nursing Unit Anesthesia Type: Spinal Level of consciousness: oriented and awake and alert Pain management: pain level controlled Vital Signs Assessment: post-procedure vital signs reviewed and stable Respiratory status: spontaneous breathing and respiratory function stable Cardiovascular status: blood pressure returned to baseline and stable Postop Assessment: no headache, no backache, no apparent nausea or vomiting and patient able to bend at knees Anesthetic complications: no   No notable events documented.  Last Vitals:  Vitals:   10/01/23 1630 10/01/23 1645  BP: 95/62 114/71  Pulse: (!) 101 99  Resp: 15 12  Temp:    SpO2: 94% 94%    Last Pain:  Vitals:   10/01/23 1609  TempSrc:   PainSc: 9                  Trevor Iha

## 2023-10-01 NOTE — Progress Notes (Signed)
Pharmacy Antibiotic Note  Tyler Gates is a 67 y.o. male admitted on 10/01/2023 s/p resection of right TKA for joint infection. He underwent right TKA in May 2024. Approximately one month PTA, he started having swelling. Culture from fluid aspiration was positive for staph epidermidis. He is following with ID and has been on amoxicillin. Plan to involve ID for antibiotic plan - anticipate IV antibiotics at discharge. Pharmacy has been consulted for vancomycin dosing.  Plan: -S/p vancomycin total 2 g in OR, follow with vancomycin 1750 mg IV q24h -Follow up ID recommendations for antibiotics  Height: 5\' 9"  (175.3 cm) Weight: 89.4 kg (197 lb) IBW/kg (Calculated) : 70.7  Temp (24hrs), Avg:97.8 F (36.6 C), Min:97.5 F (36.4 C), Max:98.3 F (36.8 C)  Recent Labs  Lab 10/01/23 1409  CREATININE 1.20    Estimated Creatinine Clearance: 66.1 mL/min (by C-G formula based on SCr of 1.2 mg/dL).    Allergies  Allergen Reactions   Sulfasalazine Other (See Comments)    Flu like feelings   Statins Other (See Comments)    REACTION: Myalgia    Antimicrobials this admission: Vancomycin 10/9 >> Amoxicillin from PTA continued for now Cefazolin x 3 for surgical ppx  Dose adjustments this admission: NA  Microbiology results: 10/9 Cx (right knee): pending  Thank you for allowing pharmacy to be a part of this patient's care.  Pricilla Riffle, PharmD, BCPS Clinical Pharmacist 10/01/2023 6:25 PM

## 2023-10-02 ENCOUNTER — Other Ambulatory Visit: Payer: Self-pay

## 2023-10-02 DIAGNOSIS — K5090 Crohn's disease, unspecified, without complications: Secondary | ICD-10-CM

## 2023-10-02 DIAGNOSIS — T8453XA Infection and inflammatory reaction due to internal right knee prosthesis, initial encounter: Principal | ICD-10-CM

## 2023-10-02 DIAGNOSIS — B9689 Other specified bacterial agents as the cause of diseases classified elsewhere: Secondary | ICD-10-CM

## 2023-10-02 LAB — CBC
HCT: 26 % — ABNORMAL LOW (ref 39.0–52.0)
Hemoglobin: 8.4 g/dL — ABNORMAL LOW (ref 13.0–17.0)
MCH: 29.5 pg (ref 26.0–34.0)
MCHC: 32.3 g/dL (ref 30.0–36.0)
MCV: 91.2 fL (ref 80.0–100.0)
Platelets: 197 10*3/uL (ref 150–400)
RBC: 2.85 MIL/uL — ABNORMAL LOW (ref 4.22–5.81)
RDW: 14.1 % (ref 11.5–15.5)
WBC: 13 10*3/uL — ABNORMAL HIGH (ref 4.0–10.5)
nRBC: 0 % (ref 0.0–0.2)

## 2023-10-02 LAB — BASIC METABOLIC PANEL
Anion gap: 9 (ref 5–15)
BUN: 22 mg/dL (ref 8–23)
CO2: 25 mmol/L (ref 22–32)
Calcium: 8.1 mg/dL — ABNORMAL LOW (ref 8.9–10.3)
Chloride: 101 mmol/L (ref 98–111)
Creatinine, Ser: 1.2 mg/dL (ref 0.61–1.24)
GFR, Estimated: >60 mL/min (ref >60)
Glucose, Bld: 158 mg/dL — ABNORMAL HIGH (ref 70–99)
Potassium: 4.7 mmol/L (ref 3.5–5.1)
Sodium: 135 mmol/L (ref 135–145)

## 2023-10-02 MED ORDER — CHLORHEXIDINE GLUCONATE CLOTH 2 % EX PADS
6.0000 | MEDICATED_PAD | Freq: Every day | CUTANEOUS | Status: DC
  Administered 2023-10-02 – 2023-10-03 (×2): 6 via TOPICAL

## 2023-10-02 MED ORDER — CEFAZOLIN SODIUM-DEXTROSE 2-4 GM/100ML-% IV SOLN
2.0000 g | Freq: Three times a day (TID) | INTRAVENOUS | Status: DC
  Administered 2023-10-02 – 2023-10-04 (×7): 2 g via INTRAVENOUS
  Filled 2023-10-02 (×7): qty 100

## 2023-10-02 MED ORDER — SODIUM CHLORIDE 0.9% FLUSH
10.0000 mL | INTRAVENOUS | Status: DC | PRN
  Administered 2023-10-04: 10 mL

## 2023-10-02 MED ORDER — SODIUM CHLORIDE 0.9% FLUSH: 10.0000 mL | Freq: Two times a day (BID) | INTRAVENOUS | Status: DC

## 2023-10-02 NOTE — Discharge Instructions (Addendum)
Dr. Samson Frederic Total Joint Specialist [facility] [address], [address] [place], [place] [address] [phone]  TOTAL KNEE REPLACEMENT POSTOPERATIVE DIRECTIONS    Knee Rehabilitation, Guidelines Following Surgery  Results after knee surgery are often greatly improved when you follow the exercise, range of motion and muscle strengthening exercises prescribed by your doctor. Safety measures are also important to protect the knee from further injury. Any time any of these exercises cause you to have increased pain or swelling in your knee joint, decrease the amount until you are comfortable again and slowly increase them. If you have problems or questions, call your caregiver or physical therapist for advice.   WEIGHT BEARING Partial weight bearing with assist device as directed.  50% weightbearing with knee immobilizer  HOME CARE INSTRUCTIONS  Remove items at home which could result in a fall. This includes throw rugs or furniture in walking pathways.  Continue medications as instructed at time of discharge. You may have some home medications which will be placed on hold until you complete the course of blood thinner medication.  Walk with walker as instructed.  You may resume a sexual relationship in one month or when given the OK by your doctor.  Use walker as long as suggested by your caregivers. Avoid periods of inactivity such as sitting longer than an hour when not asleep. This helps prevent blood clots.  You may put full weight on your legs and walk as much as is comfortable.  You may return to work once you are cleared by your doctor.  Do not drive a car for 6 weeks or until released by you surgeon.  Do not drive while taking narcotics.  Wear the elastic stockings for three weeks following surgery during the day but you may remove then at night. Make sure you keep all of your appointments after your operation with all of your doctors and caregivers. You  should call the office at the above phone number and make an appointment for approximately two weeks after the date of your surgery. Do not remove your surgical dressing.  Please pick up a stool softener and laxative for home use as long as you are requiring pain medications. ICE to the affected knee every three hours for 30 minutes at a time and then as needed for pain and swelling.  Continue to use ice on the knee for pain and swelling from surgery. You may notice swelling that will progress down to the foot and ankle.  This is normal after surgery.  Elevate the leg when you are not up walking on it.   It is important for you to complete the blood thinner medication as prescribed by your doctor. Continue to use the breathing machine which will help keep your temperature down.  It is common for your temperature to cycle up and down following surgery, especially at night when you are not up moving around and exerting yourself.  The breathing machine keeps your lungs expanded and your temperature down.  RANGE OF MOTION AND STRENGTHENING EXERCISES  Rehabilitation of the knee is important following a knee injury or an operation. After just a few days of immobilization, the muscles of the thigh which control the knee become weakened and shrink (atrophy). Knee exercises are designed to build up the tone and strength of the thigh muscles and to improve knee motion. Often times heat used for twenty to thirty minutes before working out will loosen up your tissues and help with improving the  range of motion but do not use heat for the first two weeks following surgery. These exercises can be done on a training (exercise) mat, on the floor, on a table or on a bed. Use what ever works the best and is most comfortable for you Knee exercises include:  Leg Lifts - While your knee is still immobilized in a splint or cast, you can do straight leg raises. Lift the leg to 60 degrees, hold for 3 sec, and slowly lower the leg.  Repeat 10-20 times 2-3 times daily. Perform this exercise against resistance later as your knee gets better.  Quad and Hamstring Sets - Tighten up the muscle on the front of the thigh (Quad) and hold for 5-10 sec. Repeat this 10-20 times hourly. Hamstring sets are done by pushing the foot backward against an object and holding for 5-10 sec. Repeat as with quad sets.  A rehabilitation program following serious knee injuries can speed recovery and prevent re-injury in the future due to weakened muscles. Contact your doctor or a physical therapist for more information on knee rehabilitation.   POST-OPERATIVE OPIOID TAPER INSTRUCTIONS: It is important to wean off of your opioid medication as soon as possible. If you do not need pain medication after your surgery it is ok to stop day one. Opioids include: Codeine, Hydrocodone(Norco, Vicodin), Oxycodone(Percocet, oxycontin) and hydromorphone amongst others.  Long term and even short term use of opiods can cause: Increased pain response Dependence Constipation Depression Respiratory depression And more.  Withdrawal symptoms can include Flu like symptoms Nausea, vomiting And more Techniques to manage these symptoms Hydrate well Eat regular healthy meals Stay active Use relaxation techniques(deep breathing, meditating, yoga) Do Not substitute Alcohol to help with tapering If you have been on opioids for less than two weeks and do not have pain than it is ok to stop all together.  Plan to wean off of opioids This plan should start within one week post op of your joint replacement. Maintain the same interval or time between taking each dose and first decrease the dose.  Cut the total daily intake of opioids by one tablet each day Next start to increase the time between doses. The last dose that should be eliminated is the evening dose.    SKILLED REHAB INSTRUCTIONS: If the patient is transferred to a skilled rehab facility following release  from the hospital, a list of the current medications will be sent to the facility for the patient to continue.  When discharged from the skilled rehab facility, please have the facility set up the patient's Home Health Physical Therapy prior to being released. Also, the skilled facility will be responsible for providing the patient with their medications at time of release from the facility to include their pain medication, the muscle relaxants, and their blood thinner medication. If the patient is still at the rehab facility at time of the two week follow up appointment, the skilled rehab facility will also need to assist the patient in arranging follow up appointment in our office and any transportation needs.  MAKE SURE YOU:  Understand these instructions.  Will watch your condition.  Will get help right away if you are not doing well or get worse.    Pick up stool softner and laxative for home use following surgery while on pain medications. Do NOT remove your dressing.  Please use a clean towel to pat the incision dry following showers. Continue to use ice for pain and swelling after surgery. Do  not use any lotions or creams on the incision until instructed by your surgeon. Please charge prevena negative pressure dressing nightly.  Follow-up within 7 days prior to discharge.

## 2023-10-02 NOTE — Plan of Care (Signed)
  Problem: Education: Goal: Knowledge of the prescribed therapeutic regimen will improve Outcome: Progressing Goal: Individualized Educational Video(s) Outcome: Completed/Met   Problem: Activity: Goal: Ability to avoid complications of mobility impairment will improve Outcome: Progressing Goal: Range of joint motion will improve Outcome: Progressing   Problem: Clinical Measurements: Goal: Postoperative complications will be avoided or minimized Outcome: Progressing   Problem: Pain Management: Goal: Pain level will decrease with appropriate interventions Outcome: Progressing   Problem: Skin Integrity: Goal: Will show signs of wound healing Outcome: Progressing   Problem: Education: Goal: Knowledge of General Education information will improve Description: Including pain rating scale, medication(s)/side effects and non-pharmacologic comfort measures Outcome: Progressing   Problem: Education: Goal: Knowledge of General Education information will improve Description: Including pain rating scale, medication(s)/side effects and non-pharmacologic comfort measures Outcome: Progressing   Problem: Health Behavior/Discharge Planning: Goal: Ability to manage health-related needs will improve Outcome: Progressing   Problem: Clinical Measurements: Goal: Ability to maintain clinical measurements within normal limits will improve Outcome: Progressing Goal: Will remain free from infection Outcome: Progressing Goal: Diagnostic test results will improve Outcome: Progressing Goal: Respiratory complications will improve Outcome: Progressing Goal: Cardiovascular complication will be avoided Outcome: Progressing   Problem: Activity: Goal: Risk for activity intolerance will decrease Outcome: Progressing   Problem: Nutrition: Goal: Adequate nutrition will be maintained Outcome: Completed/Met   Problem: Coping: Goal: Level of anxiety will decrease Outcome: Progressing   Problem:  Elimination: Goal: Will not experience complications related to bowel motility Outcome: Progressing Goal: Will not experience complications related to urinary retention Outcome: Progressing   Problem: Pain Managment: Goal: General experience of comfort will improve Outcome: Progressing   Problem: Safety: Goal: Ability to remain free from injury will improve Outcome: Progressing   Problem: Skin Integrity: Goal: Risk for impaired skin integrity will decrease Outcome: Progressing

## 2023-10-02 NOTE — Consult Note (Signed)
[facility]    Date of Admission:  10/01/2023   Total days of inpatient antibiotics 1        Reason for Consult:  PJI    Principal Problem:   Infection of total right knee replacement (HCC) Active Problems:   Infection of total right knee replacement, subsequent encounter   Assessment: 67 year old male with history of primary right TKA on 04/23/2023 complicated by worsening pain after yellowjacket bite in September 2024.  Underwent right knee aspiration on 8/11 with cultures growing MSSE.  He has been on amoxicillin since 10/7 due to sores around right knee.  Admitted with: #R knee PJI SP resection RTKA and spacer placement on 10/9 -patient was seen by me, infectious disease on 9/18.  He had brought in lab work a right knee aspirate cultures growing MSSE, with 26,000 WBC, 88% neutrophils.  At that point there was no concern for overt signs of infection.  He was planned to have a right TKA as such plan was to hold antibiotics till OR.  In the interim patient called and noted some sores around his right knee as such consult to start amoxicillin till OR on 7 -Taken to Or with Dr. Linna Caprice, noted grossly purulent joint fluid Recommendations:  -D/C amoxicillin and vancomycin -Start cefazolin -Follow OR cx -Place PICC  #Crohn's disease on Remicade - Followed by GI - Patient was consulted that Remicade needs to be held 2 weeks out from completing antibiotic therapy. Last dose of remicade on 7/25. Microbiology:   Antibiotics: Amoxilicllin 10/7- Vancomycin 10/9-  Cultures:   Other 10/9 OR cx pending  HPI: Tyler Gates is a 67 y.o. male with past medical history of Crohn's disease on Remicade, arthritis, GERD, hyperlipidemia, hypertension, primary right TKA on 04/23/2023 with Dr. Linna Caprice followed by infectious disease due to concern for PJI.  He is followed by orthopedics orthopedic patient and had a right knee aspiration pain fallings a sting by a  yellowjacket.  Knee was aspirated on 06/03/2023.  Noted 26,000 WBC, Synovasure positive for Staphylococcus panel, cultures grew staph epi oxacillin sensitive.   Review of Systems: Review of Systems  All other systems reviewed and are negative.   Past Medical History:  Diagnosis Date   Arthritis    Crohn's disease (HCC)    Depression    Dyspnea on exertion    GERD (gastroesophageal reflux disease)    Herpes    History of kidney stones    Hyperlipidemia    Hypertension    Instability of knee joint    Left displaced femoral neck fracture (HCC) 07/11/2017   OSA (obstructive sleep apnea)    mild   Snoring     Social History   Tobacco Use   Smoking status: Never   Smokeless tobacco: Never  Vaping Use   Vaping status: Never Used  Substance Use Topics   Alcohol use: Not Currently   Drug use: No    Family History  Problem Relation Age of Onset   Melanoma Father    Heart disease Father    Healthy Mother    Colon cancer Neg Hx    Esophageal cancer Neg Hx    Scheduled Meds:  acetaminophen  1,000 mg Oral Q6H   amoxicillin  1,000 mg Oral BID   aspirin  81 mg Oral BID   docusate sodium  100 mg Oral BID   ezetimibe  10 mg Oral Daily   pantoprazole  40  mg Oral Daily   rosuvastatin  20 mg Oral Daily   senna  1 tablet Oral BID   venlafaxine  37.5 mg Oral BID   Continuous Infusions:  sodium chloride 20 mL/hr at 10/02/23 0600   sodium chloride     methocarbamol (ROBAXIN) IV     vancomycin 1,750 mg (10/02/23 0749)   PRN Meds:.acetaminophen, alum & mag hydroxide-simeth, bisacodyl, diphenhydrAMINE, HYDROmorphone (DILAUDID) injection, menthol-cetylpyridinium **OR** phenol, methocarbamol **OR** methocarbamol (ROBAXIN) IV, metoCLOPramide **OR** metoCLOPramide (REGLAN) injection, ondansetron **OR** ondansetron (ZOFRAN) IV, oxyCODONE, oxyCODONE, polyethylene glycol Allergies  Allergen Reactions   Sulfasalazine Other (See Comments)    Flu like feelings   Statins Other (See  Comments)    REACTION: Myalgia    OBJECTIVE: Blood pressure 115/73, pulse 75, temperature 98 F (36.7 C), resp. rate 18, height 5\' 9"  (1.753 m), weight 89.4 kg, SpO2 96%.  Physical Exam Constitutional:      General: He is not in acute distress.    Appearance: He is normal weight. He is not toxic-appearing.  HENT:     Head: Normocephalic and atraumatic.     Right Ear: External ear normal.     Left Ear: External ear normal.     Nose: No congestion or rhinorrhea.     Mouth/Throat:     Mouth: Mucous membranes are moist.     Pharynx: Oropharynx is clear.  Eyes:     Extraocular Movements: Extraocular movements intact.     Conjunctiva/sclera: Conjunctivae normal.     Pupils: Pupils are equal, round, and reactive to light.  Cardiovascular:     Rate and Rhythm: Normal rate and regular rhythm.     Heart sounds: No murmur heard.    No friction rub. No gallop.  Pulmonary:     Effort: Pulmonary effort is normal.     Breath sounds: Normal breath sounds.  Abdominal:     General: Abdomen is flat. Bowel sounds are normal.     Palpations: Abdomen is soft.  Musculoskeletal:        General: No swelling.     Cervical back: Normal range of motion and neck supple.     Comments: R knee wound  Skin:    General: Skin is warm and dry.  Neurological:     General: No focal deficit present.     Mental Status: He is oriented to person, place, and time.  Psychiatric:        Mood and Affect: Mood normal.     Lab Results Lab Results  Component Value Date   WBC 13.0 (H) 10/02/2023   HGB 8.4 (L) 10/02/2023   HCT 26.0 (L) 10/02/2023   MCV 91.2 10/02/2023   PLT 197 10/02/2023    Lab Results  Component Value Date   CREATININE 1.20 10/02/2023   BUN 22 10/02/2023   NA 135 10/02/2023   K 4.7 10/02/2023   CL 101 10/02/2023   CO2 25 10/02/2023    Lab Results  Component Value Date   ALT 54 (H) 09/10/2023   AST 29 09/10/2023   ALKPHOS 84 08/01/2023   BILITOT 0.4 09/10/2023       Danelle Earthly, MD [facility] 10/02/2023, 12:28 PM I have personally spent 82 minutes involved in face-to-face and non-face-to-face activities for this patient on the day of the visit. Professional time spent includes the following activities: Preparing to see the patient (review of tests), Obtaining and/or reviewing separately obtained history (admission/discharge record), Performing a  medically appropriate examination and/or evaluation , Ordering medications/tests/procedures, referring and communicating with other health care professionals, Documenting clinical information in the EMR, Independently interpreting results (not separately reported), Communicating results to the patient/family/caregiver, Counseling and educating the patient/family/caregiver and Care coordination (not separately reported).

## 2023-10-02 NOTE — Plan of Care (Signed)
  Problem: Education: Goal: Knowledge of the prescribed therapeutic regimen will improve Outcome: Progressing   Problem: Activity: Goal: Ability to avoid complications of mobility impairment will improve Outcome: Progressing   Problem: Pain Management: Goal: Pain level will decrease with appropriate interventions Outcome: Progressing   Problem: Education: Goal: Knowledge of General Education information will improve Description: Including pain rating scale, medication(s)/side effects and non-pharmacologic comfort measures Outcome: Progressing   Problem: Activity: Goal: Risk for activity intolerance will decrease Outcome: Progressing   Problem: Elimination: Goal: Will not experience complications related to bowel motility Outcome: Progressing   Problem: Pain Managment: Goal: General experience of comfort will improve Outcome: Progressing   

## 2023-10-02 NOTE — Progress Notes (Signed)
Peripherally Inserted Central Catheter Placement  The IV Nurse has discussed with the patient and/or persons authorized to consent for the patient, the purpose of this procedure and the potential benefits and risks involved with this procedure.  The benefits include less needle sticks, lab draws from the catheter, and the patient may be discharged home with the catheter. Risks include, but not limited to, infection, bleeding, blood clot (thrombus formation), and puncture of an artery; nerve damage and irregular heartbeat and possibility to perform a PICC exchange if needed/ordered by physician.  Alternatives to this procedure were also discussed.  Bard Power PICC patient education guide, fact sheet on infection prevention and patient information card has been provided to patient /or left at bedside.    PICC Placement Documentation  PICC Single Lumen 10/02/23 Right Brachial 39 cm 0 cm (Active)  Indication for Insertion or Continuance of Line Prolonged intravenous therapies 10/02/23 1642  Exposed Catheter (cm) 0 cm 10/02/23 1642  Site Assessment Clean, Dry, Intact 10/02/23 1642  Line Status Flushed;Blood return noted;Saline locked 10/02/23 1642  Dressing Type Transparent 10/02/23 1642  Dressing Status Antimicrobial disc in place 10/02/23 1642  Line Care Connections checked and tightened 10/02/23 1642  Dressing Intervention New dressing 10/02/23 1642  Dressing Change Due 10/09/23 10/02/23 1642       Tyler Gates 10/02/2023, 4:45 PM

## 2023-10-02 NOTE — Evaluation (Signed)
Physical Therapy Evaluation Patient Details Name: Tyler Gates MRN: [id number] DOB: June 14, 1956 Today's Date: 10/02/2023  History of Present Illness  Pt s/p R TKR resection with spacer placement.  Pt with hx of Chrohn's dz, L femur fx s/p pinning (2018) and R TKR (5/24)  Clinical Impression  Pt admitted as above and presenting with functional mobility limitations 2* decreased R LE strength/ROM, post op pain and PWB.  Pt should progress to dc home with family assist.        If plan is discharge home, recommend the following: A little help with walking and/or transfers;A little help with bathing/dressing/bathroom;Assistance with cooking/housework;Assist for transportation;Help with stairs or ramp for entrance   Can travel by private vehicle        Equipment Recommendations None recommended by PT  Recommendations for Other Services       Functional Status Assessment Patient has had a recent decline in their functional status and demonstrates the ability to make significant improvements in function in a reasonable and predictable amount of time.     Precautions / Restrictions Precautions Precautions: Fall Restrictions Weight Bearing Restrictions: Yes RLE Weight Bearing: Partial weight bearing RLE Partial Weight Bearing Percentage or Pounds: 50      Mobility  Bed Mobility Overal bed mobility: Needs Assistance Bed Mobility: Supine to Sit     Supine to sit: Min assist     General bed mobility comments: cues for sequence and use of L LE to self assist    Transfers Overall transfer level: Needs assistance Equipment used: Rolling walker (2 wheels) Transfers: Sit to/from Stand Sit to Stand: Min assist           General transfer comment: cues for LE management and use of UEs to self assist    Ambulation/Gait Ambulation/Gait assistance: Min assist, Contact guard assist Gait Distance (Feet): 70 Feet Assistive device: Rolling walker (2 wheels) Gait  Pattern/deviations: Step-to pattern, Decreased step length - right, Decreased step length - left, Shuffle, Trunk flexed Gait velocity: decr     General Gait Details: Cues for sequence, posture, position from RW and PWB  Stairs            Wheelchair Mobility     Tilt Bed    Modified Rankin (Stroke Patients Only)       Balance Overall balance assessment: Needs assistance Sitting-balance support: No upper extremity supported, Feet supported Sitting balance-Leahy Scale: Good     Standing balance support: Bilateral upper extremity supported Standing balance-Leahy Scale: Poor                               Pertinent Vitals/Pain Pain Assessment Pain Assessment: 0-10 Pain Score: 3  Pain Location: R knee Pain Descriptors / Indicators: Aching, Sore Pain Intervention(s): Limited activity within patient's tolerance, Monitored during session, Premedicated before session, Ice applied    Home Living Family/patient expects to be discharged to:: Private residence Living Arrangements: Spouse/significant other Available Help at Discharge: Family Type of Home: House Home Access: Stairs to enter Entrance Stairs-Rails: Left Entrance Stairs-Number of Steps: 3   Home Layout: One level;Laundry or work area in [employer] Equipment: Agricultural consultant (2 wheels);Cane - single point      Prior Function Prior Level of Function : Independent/Modified Independent;Driving             Mobility Comments: IND with all ADLs, self care tasks, IADLs, driving       Extremity/Trunk Assessment  Upper Extremity Assessment Upper Extremity Assessment: Overall WFL for tasks assessed    Lower Extremity Assessment Lower Extremity Assessment: Overall WFL for tasks assessed    Cervical / Trunk Assessment Cervical / Trunk Assessment: Normal  Communication   Communication Communication: No apparent difficulties  Cognition Arousal: Alert Behavior During Therapy: WFL for tasks  assessed/performed Overall Cognitive Status: Within Functional Limits for tasks assessed                                          General Comments      Exercises General Exercises - Lower Extremity Ankle Circles/Pumps: AROM, Both, 15 reps, Supine   Assessment/Plan    PT Assessment Patient needs continued PT services  PT Problem List Decreased strength;Decreased range of motion;Decreased activity tolerance;Decreased balance;Decreased mobility;Decreased knowledge of use of DME;Pain;Decreased safety awareness       PT Treatment Interventions DME instruction;Gait training;Stair training;Functional mobility training;Therapeutic activities;Therapeutic exercise;Patient/family education    PT Goals (Current goals can be found in the Care Plan section)  Acute Rehab PT Goals Patient Stated Goal: REgain IND PT Goal Formulation: With patient Time For Goal Achievement: 10/09/23 Potential to Achieve Goals: Good    Frequency Min 1X/week     Co-evaluation               AM-PAC PT "6 Clicks" Mobility  Outcome Measure Help needed turning from your back to your side while in a flat bed without using bedrails?: A Little Help needed moving from lying on your back to sitting on the side of a flat bed without using bedrails?: A Little Help needed moving to and from a bed to a chair (including a wheelchair)?: A Little Help needed standing up from a chair using your arms (e.g., wheelchair or bedside chair)?: A Little Help needed to walk in hospital room?: A Little Help needed climbing 3-5 steps with a railing? : A Little 6 Click Score: 18    End of Session Equipment Utilized During Treatment: Gait belt Activity Tolerance: Patient tolerated treatment well Patient left: in chair;with call bell/phone within reach;with chair alarm set Nurse Communication: Mobility status PT Visit Diagnosis: Unsteadiness on feet (R26.81);Difficulty in walking, not elsewhere classified  (R26.2)    Time: 1610-9604 PT Time Calculation (min) (ACUTE ONLY): 21 min   Charges:   PT Evaluation $PT Eval Low Complexity: 1 Low   PT General Charges $$ ACUTE PT VISIT: 1 Visit         Mauro Kaufmann PT Acute Rehabilitation Services Pager [phone] Office [phone]   Mayank Teuscher 10/02/2023, 1:55 PM

## 2023-10-02 NOTE — TOC Initial Note (Signed)
Transition of Care [facility]) - Initial/Assessment Note    Patient Details  Name: Tyler Gates MRN: [id number] Date of Birth: 19-Apr-1956  [facility] of [facility]) CM/SW Contact:    Amada Jupiter, LCSW Phone Number: 10/02/2023, 2:41 PM  Clinical Narrative:                 Met with pt and wife today to introduce TOC/ CSW role with dc planning.  Both aware of plan for pt to dc home with IV abx coverage and HHRN visits - agreeable.  They do no have any agency preferences - defer to [facility] to refer to someone within insurance network who can staff case.  Referral placed with [facility] who will follow up with pt and spouse at hospital to complete education. Await HH orders for [facility] to be placed (and confirm if PT may be needed).  Continue to follow.  Expected Discharge Plan: Home w Home Health Services Barriers to Discharge: No Barriers Identified   Patient Goals and CMS Choice Patient states their goals for this hospitalization and ongoing recovery are:: return home          Expected Discharge Plan and Services In-house Referral: Clinical Social Work   Post Acute Care Choice: Home Health Living arrangements for the past 2 months: Independent Living Facility                           HH Arranged: IV Antibiotics, RN HH Agency: [facility] Date HH Agency Contacted: 10/02/23   Representative spoke with at [facility] Agency: Lenor Coffin.  Prior Living Arrangements/Services Living arrangements for the past 2 months: Independent Living Facility Lives with:: Spouse Patient language and need for interpreter reviewed:: Yes Do you feel safe going back to the place where you live?: Yes      Need for Family Participation in Patient Care: Yes (Comment) Care giver support system in place?: Yes (comment)   Criminal Activity/Legal Involvement Pertinent to Current Situation/Hospitalization: No - Comment as needed  Activities of Daily Living   ADL Screening (condition at time of  admission) Independently performs ADLs?: Yes (appropriate for developmental age) Does the patient have a NEW difficulty with bathing/dressing/toileting/self-feeding that is expected to last >3 days?: No Does the patient have a NEW difficulty with getting in/out of bed, walking, or climbing stairs that is expected to last >3 days?: No Does the patient have a NEW difficulty with communication that is expected to last >3 days?: No Is the patient deaf or have difficulty hearing?: Yes Does the patient have difficulty seeing, even when wearing glasses/contacts?: Yes Does the patient have difficulty concentrating, remembering, or making decisions?: No  Permission Sought/Granted Permission sought to share information with : Family Supports Permission granted to share information with : Yes, Verbal Permission Granted  Share Information with NAME: wife, Darl Pikes @ [phone]           Emotional Assessment Appearance:: Appears stated age Attitude/Demeanor/Rapport: Gracious Affect (typically observed): Accepting Orientation: : Oriented to Self, Oriented to Place, Oriented to  Time, Oriented to Situation Alcohol / Substance Use: Not Applicable Psych Involvement: No (comment)  Admission diagnosis:  Infection of total right knee replacement, subsequent encounter [T84.53XD] Patient Active Problem List   Diagnosis Date Noted   Infection of total right knee replacement, subsequent encounter 10/01/2023   Infection of total right knee replacement (HCC) 08/27/2023   Rash 08/01/2023   Body aches 08/01/2023   Osteoarthritis of right knee 04/23/2023  Preoperative clearance 02/11/2023   Chronic sinusitis 03/12/2022   Visual disturbance 03/12/2022   Acute sinusitis 01/29/2022   Chronic kidney disease (CKD) stage G2/A2, mildly decreased glomerular filtration rate (GFR) between 60-89 mL/min/1.73 square meter and albuminuria creatinine ratio between 30-299 mg/g 10/22/2018   Hypogonadism male 10/21/2018    Screening PSA (prostate specific antigen) 10/21/2018   Chronic kidney disease due to benign hypertension 10/21/2018   Depression, major, recurrent, in remission (HCC) 10/21/2018   Statin intolerance 09/14/2018   Agatston coronary artery calcium score greater than 400 08/10/2018   Family history of coronary artery disease 08/10/2018   Crohn's disease of colon without complication (HCC) 05/09/2017   Mucosal abnormality of esophagus    Hiatal hernia    KNEE JOINT INSTABILITY 03/07/2007   Hyperlipidemia 12/30/2006   Essential hypertension 12/30/2006   GERD 12/30/2006   PCP:  Everrett Coombe, DO Pharmacy:   [id number] Lorenza Evangelist, [place] - [address] [place] [address] Phone: [phone] Fax: [phone]  [facility] - [place], [place] - [address] [place] [address] Phone: [phone] Fax: [phone]     Social Determinants of Health (SDOH) Social History: SDOH Screenings   Food Insecurity: No Food Insecurity (10/01/2023)  Housing: Low Risk  (10/01/2023)  Transportation Needs: No Transportation Needs (10/01/2023)  Utilities: Not At Risk (10/01/2023)  Alcohol Screen: Low Risk  (08/12/2023)  Depression (PHQ2-9): Low Risk  (09/10/2023)  Financial Resource Strain: Low Risk  (08/12/2023)  Physical Activity: Sufficiently Active (08/12/2023)  Social Connections: Moderately Isolated (08/12/2023)  Stress: No Stress Concern Present (08/12/2023)  Tobacco Use: Low Risk  (10/01/2023)  Health Literacy: Adequate Health Literacy (08/12/2023)   SDOH Interventions:     Readmission Risk Interventions    10/02/2023    2:37 PM  Readmission Risk Prevention Plan  Post Dischage Appt Complete  Medication Screening Complete  Transportation Screening Complete

## 2023-10-02 NOTE — Progress Notes (Signed)
Subjective:  Patient reports pain as mild to moderate.  Denies N/V/CP/SOB. He reports that his pain is much better controlled than previous surgery. Denies any tingling or numbness in LE bilaterally.   All questions solicited and answered. Covered care and what to expect over the next few days.   Objective:   VITALS:   Vitals:   10/01/23 1849 10/01/23 2134 10/02/23 0126 10/02/23 0548  BP: 102/72 (!) 91/57 103/71 109/69  Pulse: 97 72 76 72  Resp: 18 15 15 15   Temp: 97.8 F (36.6 C) 97.8 F (36.6 C) 97.8 F (36.6 C)   TempSrc:      SpO2: 95% 92% 93% 91%  Weight:      Height:        Patient sitting up in bed. NAD. O2 via nasal cannula this morning O2 ~94.  Neurologically intact ABD soft Neurovascular intact Sensation intact distally Intact pulses distally Dorsiflexion/Plantar flexion intact No cellulitis present Compartment soft Prevena negative pressure dressing C/D/I. House vac on no leaks detected.  Hemovac dressing C/D/I. Output 190 cc blood fluid total 80 since last shift. Continue drain.   Lab Results  Component Value Date   WBC 13.0 (H) 10/02/2023   HGB 8.4 (L) 10/02/2023   HCT 26.0 (L) 10/02/2023   MCV 91.2 10/02/2023   PLT 197 10/02/2023   BMET    Component Value Date/Time   NA 135 10/02/2023 0348   NA 139 08/01/2023 0843   K 4.7 10/02/2023 0348   CL 101 10/02/2023 0348   CO2 25 10/02/2023 0348   GLUCOSE 158 (H) 10/02/2023 0348   BUN 22 10/02/2023 0348   BUN 17 08/01/2023 0843   CREATININE 1.20 10/02/2023 0348   CREATININE 1.30 09/10/2023 1324   CALCIUM 8.1 (L) 10/02/2023 0348   EGFR 60 09/10/2023 1324   EGFR 56 (L) 08/01/2023 0843   GFRNONAA >60 10/02/2023 0348   GFRNONAA 58 (L) 05/28/2019 0842   Results for orders placed or performed during the hospital encounter of 10/01/23  Aerobic/Anaerobic Culture w Gram Stain (surgical/deep wound)     Status: None (Preliminary result)   Collection Time: 10/01/23 10:21 AM   Specimen: Soft  Tissue, Other  Result Value Ref Range Status   Specimen Description   Final    TISSUE RIGHT KNEE Performed at [facility], [address] [place], [place] [address]    Special Requests   Final    NONE Performed at [facility], [address] [place], [place] [address]    Gram Stain   Final    RARE WBC PRESENT, PREDOMINANTLY PMN NO ORGANISMS SEEN Performed at [facility], [address], [place], [place] [address]    Culture PENDING  Incomplete   Report Status PENDING  Incomplete     Assessment/Plan: 1 Day Post-Op   Principal Problem:   Infection of total right knee replacement (HCC) Active Problems:   Infection of total right knee replacement, subsequent encounter  ABLA. Hemoglobin 8.4. Continue to monitor.   50% weightbearing with KI and walker. Okay to come out of KI in bed.  DVT ppx: Aspirin, SCDs, TEDS PO pain control PT/OT: Has not seen yet.  Dispo:  - Known PJI with staph epi. Started IV vancomycin per pharmacy consult. Synovasure results in physical chart.  - Intraoperative cultures pending. Continue to monitor.  - ID to consult for IV antibiotic duration and selection. Anticipate need for PICC line for long-term IV antibiotics.  -  We will discontinue the Hemovac drain when output is less than 30 cc per shift.  - Upon discharge, the house VAC suction unit will be exchanged for a portable Prevena suction unit. He will need to follow-up in the office within 7 days of discharge for removal of his negative pressure dressing.    Clois Dupes, PA-C 10/02/2023, 7:10 AM   [facility]  [place] [address], [address], [place], [place] [address] Phone: [phone] [email or web address] Facebook  [employer]

## 2023-10-02 NOTE — Progress Notes (Signed)
Physical Therapy Treatment Patient Details Name: Tyler Gates MRN: [id number] DOB: 10/15/1956 Today's Date: 10/02/2023   History of Present Illness Pt s/p R TKR resection with spacer placement.  Pt with hx of Chrohn's dz, L femur fx s/p pinning (2018) and R TKR (5/24)    PT Comments  Pt motivated and progressing well with mobility.  Pt reports possible PICC line placement this pm and hoping for dc home tomorrow.    If plan is discharge home, recommend the following: A little help with walking and/or transfers;A little help with bathing/dressing/bathroom;Assistance with cooking/housework;Assist for transportation;Help with stairs or ramp for entrance   Can travel by private vehicle        Equipment Recommendations  None recommended by PT    Recommendations for Other Services       Precautions / Restrictions Precautions Precautions: Fall Restrictions Weight Bearing Restrictions: Yes RLE Weight Bearing: Partial weight bearing RLE Partial Weight Bearing Percentage or Pounds: 50     Mobility  Bed Mobility Overal bed mobility: Needs Assistance Bed Mobility: Sit to Supine     Supine to sit: Min assist Sit to supine: Min assist   General bed mobility comments: cues for sequence and use of L LE to self assist    Transfers Overall transfer level: Needs assistance Equipment used: Rolling walker (2 wheels) Transfers: Sit to/from Stand Sit to Stand: Contact guard assist           General transfer comment: cues for LE management and use of UEs to self assist    Ambulation/Gait Ambulation/Gait assistance: Contact guard assist Gait Distance (Feet): 150 Feet Assistive device: Rolling walker (2 wheels) Gait Pattern/deviations: Step-to pattern, Decreased step length - right, Decreased step length - left, Shuffle, Trunk flexed Gait velocity: decr     General Gait Details: Min cues for sequence, posture, position from RW and [employer]   Stairs             Wheelchair  Mobility     Tilt Bed    Modified Rankin (Stroke Patients Only)       Balance Overall balance assessment: Needs assistance Sitting-balance support: No upper extremity supported, Feet supported Sitting balance-Leahy Scale: Good     Standing balance support: Single extremity supported Standing balance-Leahy Scale: Poor                              Cognition Arousal: Alert Behavior During Therapy: WFL for tasks assessed/performed Overall Cognitive Status: Within Functional Limits for tasks assessed                                          Exercises General Exercises - Lower Extremity Ankle Circles/Pumps: AROM, Both, 15 reps, Supine Quad Sets: AROM, Both, 10 reps, Supine    General Comments        Pertinent Vitals/Pain Pain Assessment Pain Assessment: 0-10 Pain Score: 3  Pain Location: R knee Pain Descriptors / Indicators: Aching, Sore Pain Intervention(s): Limited activity within patient's tolerance, Monitored during session, Premedicated before session, Ice applied    Home Living Family/patient expects to be discharged to:: Private residence Living Arrangements: Spouse/significant other Available Help at Discharge: Family Type of Home: House Home Access: Stairs to enter Entrance Stairs-Rails: Left Entrance Stairs-Number of Steps: 3   Home Layout: One level;Laundry or work area in [employer]:  Rolling Walker (2 wheels);Cane - single point      Prior Function            PT Goals (current goals can now be found in the care plan section) Acute Rehab PT Goals Patient Stated Goal: REgain IND PT Goal Formulation: With patient Time For Goal Achievement: 10/09/23 Potential to Achieve Goals: Good Progress towards PT goals: Progressing toward goals    Frequency    Min 1X/week      PT Plan      Co-evaluation              AM-PAC PT "6 Clicks" Mobility   Outcome Measure  Help needed turning from your  back to your side while in a flat bed without using bedrails?: A Little Help needed moving from lying on your back to sitting on the side of a flat bed without using bedrails?: A Little Help needed moving to and from a bed to a chair (including a wheelchair)?: A Little Help needed standing up from a chair using your arms (e.g., wheelchair or bedside chair)?: A Little Help needed to walk in hospital room?: A Little Help needed climbing 3-5 steps with a railing? : A Little 6 Click Score: 18    End of Session Equipment Utilized During Treatment: Gait belt Activity Tolerance: Patient tolerated treatment well Patient left: with call bell/phone within reach;in bed;with family/visitor present Nurse Communication: Mobility status PT Visit Diagnosis: Unsteadiness on feet (R26.81);Difficulty in walking, not elsewhere classified (R26.2)     Time: 8119-1478 PT Time Calculation (min) (ACUTE ONLY): 23 min  Charges:    $Gait Training: 23-37 mins PT General Charges $$ ACUTE PT VISIT: 1 Visit                     Mauro Kaufmann PT Acute Rehabilitation Services Pager [phone] Office [phone]    Tyler Gates 10/02/2023, 3:00 PM

## 2023-10-03 LAB — BASIC METABOLIC PANEL
Anion gap: 8 (ref 5–15)
BUN: 21 mg/dL (ref 8–23)
CO2: 26 mmol/L (ref 22–32)
Calcium: 8.6 mg/dL — ABNORMAL LOW (ref 8.9–10.3)
Chloride: 103 mmol/L (ref 98–111)
Creatinine, Ser: 1.06 mg/dL (ref 0.61–1.24)
GFR, Estimated: >60 mL/min (ref >60)
Glucose, Bld: 110 mg/dL — ABNORMAL HIGH (ref 70–99)
Potassium: 4 mmol/L (ref 3.5–5.1)
Sodium: 137 mmol/L (ref 135–145)

## 2023-10-03 LAB — CBC
HCT: 25.7 % — ABNORMAL LOW (ref 39.0–52.0)
Hemoglobin: 8.4 g/dL — ABNORMAL LOW (ref 13.0–17.0)
MCH: 30.1 pg (ref 26.0–34.0)
MCHC: 32.7 g/dL (ref 30.0–36.0)
MCV: 92.1 fL (ref 80.0–100.0)
Platelets: 192 10*3/uL (ref 150–400)
RBC: 2.79 MIL/uL — ABNORMAL LOW (ref 4.22–5.81)
RDW: 14.9 % (ref 11.5–15.5)
WBC: 10.7 10*3/uL — ABNORMAL HIGH (ref 4.0–10.5)
nRBC: 0 % (ref 0.0–0.2)

## 2023-10-03 MED ORDER — OXYCODONE HCL 5 MG PO TABS
5.0000 mg | ORAL_TABLET | ORAL | 0 refills | Status: DC | PRN
Start: 2023-10-03 — End: 2023-11-11

## 2023-10-03 MED ORDER — METHOCARBAMOL 500 MG PO TABS: 500.0000 mg | ORAL_TABLET | Freq: Four times a day (QID) | ORAL | 0 refills | Status: DC | PRN

## 2023-10-03 MED ORDER — POLYETHYLENE GLYCOL 3350 17 G PO PACK: 17.0000 g | PACK | Freq: Every day | ORAL | 0 refills | Status: AC | PRN

## 2023-10-03 MED ORDER — ASPIRIN 81 MG PO CHEW: 81.0000 mg | CHEWABLE_TABLET | Freq: Two times a day (BID) | ORAL | 0 refills | Status: AC

## 2023-10-03 MED ORDER — SENNA 8.6 MG PO TABS
2.0000 | ORAL_TABLET | Freq: Every day | ORAL | 0 refills | Status: AC
Start: 2023-10-03 — End: 2023-10-18

## 2023-10-03 MED ORDER — ONDANSETRON HCL 4 MG PO TABS: 4.0000 mg | ORAL_TABLET | Freq: Three times a day (TID) | ORAL | 0 refills | Status: DC | PRN

## 2023-10-03 MED ORDER — CEFAZOLIN IV (FOR PTA / DISCHARGE USE ONLY): 2.0000 g | Freq: Three times a day (TID) | INTRAVENOUS | 0 refills | Status: DC

## 2023-10-03 MED ORDER — DOCUSATE SODIUM 100 MG PO CAPS: 100.0000 mg | ORAL_CAPSULE | Freq: Two times a day (BID) | ORAL | 0 refills | Status: AC

## 2023-10-03 NOTE — Progress Notes (Signed)
Physical Therapy Treatment Patient Details Name: Tyler Gates MRN: [id number] DOB: December 16, 1956 Today's Date: 10/03/2023   History of Present Illness Pt s/p R TKR resection with spacer placement.  Pt with hx of Chrohn's dz, L femur fx s/p pinning (2018) and R TKR (5/24)    PT Comments  Pt in good spirits and hoping for dc home this date.  Limited HEP introduced but OOB deferred on arrival of bfast at pt request.  Will follow up with gait and stair training in anticipation of possible dc this date.    If plan is discharge home, recommend the following: A little help with walking and/or transfers;A little help with bathing/dressing/bathroom;Assistance with cooking/housework;Assist for transportation;Help with stairs or ramp for entrance   Can travel by private vehicle        Equipment Recommendations  None recommended by PT    Recommendations for Other Services       Precautions / Restrictions Precautions Precautions: Fall Restrictions Weight Bearing Restrictions: Yes RLE Weight Bearing: Partial weight bearing RLE Partial Weight Bearing Percentage or Pounds: 50     Mobility  Bed Mobility               General bed mobility comments: OOB deferred on arrival of bfast    Transfers                        Ambulation/Gait                   Stairs             Wheelchair Mobility     Tilt Bed    Modified Rankin (Stroke Patients Only)       Balance                                            Cognition Arousal: Alert Behavior During Therapy: WFL for tasks assessed/performed Overall Cognitive Status: Within Functional Limits for tasks assessed                                          Exercises General Exercises - Lower Extremity Ankle Circles/Pumps: AROM, Both, 15 reps, Supine Quad Sets: AROM, Both, 10 reps, Supine Hip ABduction/ADduction: AAROM, Right, 10 reps, Supine Straight Leg Raises: AAROM,  Right, 10 reps, Supine    General Comments        Pertinent Vitals/Pain Pain Assessment Pain Assessment: 0-10 Pain Score: 3  Pain Location: R knee Pain Descriptors / Indicators: Aching, Sore Pain Intervention(s): Limited activity within patient's tolerance, Monitored during session, Premedicated before session    Home Living                          Prior Function            PT Goals (current goals can now be found in the care plan section) Acute Rehab PT Goals Patient Stated Goal: REgain IND PT Goal Formulation: With patient Time For Goal Achievement: 10/09/23 Potential to Achieve Goals: Good Progress towards PT goals: Progressing toward goals    Frequency    Min 1X/week      PT Plan      Co-evaluation  AM-PAC PT "6 Clicks" Mobility   Outcome Measure  Help needed turning from your back to your side while in a flat bed without using bedrails?: A Little Help needed moving from lying on your back to sitting on the side of a flat bed without using bedrails?: A Little Help needed moving to and from a bed to a chair (including a wheelchair)?: A Little Help needed standing up from a chair using your arms (e.g., wheelchair or bedside chair)?: A Little Help needed to walk in hospital room?: A Little Help needed climbing 3-5 steps with a railing? : A Little 6 Click Score: 18    End of Session Equipment Utilized During Treatment: Gait belt Activity Tolerance: Patient tolerated treatment well Patient left: with call bell/phone within reach;in bed;with family/visitor present Nurse Communication: Mobility status PT Visit Diagnosis: Unsteadiness on feet (R26.81);Difficulty in walking, not elsewhere classified (R26.2)     Time: 1610-9604 PT Time Calculation (min) (ACUTE ONLY): 12 min  Charges:    $Therapeutic Exercise: 8-22 mins PT General Charges $$ ACUTE PT VISIT: 1 Visit                     Mauro Kaufmann PT Acute Rehabilitation  Services Pager [phone] Office [phone]    Kaisley Stiverson 10/03/2023, 9:15 AM

## 2023-10-03 NOTE — Progress Notes (Signed)
Subjective:  Patient reports pain as mild to moderate.  Denies N/V/CP/SOB/Abd pain. He reports he is doing okay. Denies any tingling or numbness in LE bilaterally.   All questions solicited and answered.   Objective:   VITALS:   Vitals:   10/02/23 0907 10/02/23 1324 10/02/23 2213 10/03/23 0529  BP: 115/73 114/69 135/79 132/79  Pulse: 75 73 92 92  Resp: 18 18 17 17   Temp: 98 F (36.7 C) 97.7 F (36.5 C) 98.1 F (36.7 C) 97.7 F (36.5 C)  TempSrc:   Oral Oral  SpO2: 96% 95% 92% 96%  Weight:      Height:        Patient lying in bed. NAD.  Neurologically intact ABD soft Neurovascular intact Sensation intact distally Intact pulses distally Dorsiflexion/Plantar flexion intact No cellulitis present Compartment soft Prevena negative pressure dressing C/D/I. No leaks detected at .  Hemovac removed this morning. Dry dressing placed over drain site. Continue dry dressing changes and pressure as needed.   Lab Results  Component Value Date   WBC 10.7 (H) 10/03/2023   HGB 8.4 (L) 10/03/2023   HCT 25.7 (L) 10/03/2023   MCV 92.1 10/03/2023   PLT 192 10/03/2023   BMET    Component Value Date/Time   NA 137 10/03/2023 0430   NA 139 08/01/2023 0843   K 4.0 10/03/2023 0430   CL 103 10/03/2023 0430   CO2 26 10/03/2023 0430   GLUCOSE 110 (H) 10/03/2023 0430   BUN 21 10/03/2023 0430   BUN 17 08/01/2023 0843   CREATININE 1.06 10/03/2023 0430   CREATININE 1.30 09/10/2023 1324   CALCIUM 8.6 (L) 10/03/2023 0430   EGFR 60 09/10/2023 1324   EGFR 56 (L) 08/01/2023 0843   GFRNONAA >60 10/03/2023 0430   GFRNONAA 58 (L) 05/28/2019 0842   Results for orders placed or performed during the hospital encounter of 10/01/23  Aerobic/Anaerobic Culture w Gram Stain (surgical/deep wound)     Status: None (Preliminary result)   Collection Time: 10/01/23 10:21 AM   Specimen: Soft Tissue, Other  Result Value Ref Range Status   Specimen Description   Final    TISSUE RIGHT  KNEE Performed at [facility], [address], [place], [place] [address]    Special Requests   Final    NONE Performed at [facility], [address], [place], [place] [address]    Gram Stain   Final    RARE WBC PRESENT, PREDOMINANTLY PMN NO ORGANISMS SEEN    Culture   Final    NO GROWTH < 24 HOURS Performed at [facility], [address], [place], [place] [address]    Report Status PENDING  Incomplete     Assessment/Plan: 2 Days Post-Op   Principal Problem:   Infection of total right knee replacement (HCC) Active Problems:   Infection of total right knee replacement, subsequent encounter  ABLA. Stable at 8.4.   50% weightbearing with KI and walker.  Okay to come out of KI in bed.  DVT ppx: Aspirin, SCDs, TEDS PO pain control PT/OT: Patient ambulated 150 and 70 feet with PT yesterday.  Dispo:   Known PJI with staph epi. Synovasure results in physical chart.  - Intraoperative cultures pending. Continue to monitor.  - ID to consulted and recommend IV cefazolin. Duration and final selection pending intraoperative cultures.  - Order for [facility] placed today.  - Hemovac discontinued this morning. Place dry dressings over drain site with  pressure as needed for drainage. Please keep drain site dressing separate from prevena negative pressure incisional dressing.  - Upon discharge, the house VAC suction unit will be exchanged for a portable Prevena suction unit. He will need to follow-up in the office within 7 days of discharge for removal of his negative pressure dressing.  - D/c pending intraoperative cultures per ID recommendations.    Clois Dupes, PA-C 10/03/2023, 10:29 AM   [facility]  [place] [address], [address], [place], [place] [address] Phone: [phone] [email or web address] Facebook  [employer]

## 2023-10-03 NOTE — Plan of Care (Signed)
  Problem: Education: Goal: Knowledge of the prescribed therapeutic regimen will improve Outcome: Progressing   Problem: Activity: Goal: Ability to avoid complications of mobility impairment will improve Outcome: Progressing   Problem: Clinical Measurements: Goal: Postoperative complications will be avoided or minimized Outcome: Progressing   Problem: Pain Management: Goal: Pain level will decrease with appropriate interventions Outcome: Progressing   Problem: Education: Goal: Individualized Educational Video(s) Outcome: Progressing   Problem: Activity: Goal: Ability to avoid complications of mobility impairment will improve Outcome: Progressing   Problem: Pain Management: Goal: Pain level will decrease with appropriate interventions Outcome: Progressing

## 2023-10-03 NOTE — Progress Notes (Signed)
PHARMACY CONSULT NOTE FOR:  OUTPATIENT  PARENTERAL ANTIBIOTIC THERAPY (OPAT)  Indication: MSSE prosthetic knee infection  Regimen: Cefazolin 2 gm IV Q 8 hours  End date: 11/12/2023  IV antibiotic discharge orders are pended. To discharging provider:  please sign these orders via discharge navigator,  Select New Orders & click on the button choice - Manage This Unsigned Work.     Thank you for allowing pharmacy to be a part of this patient's care.  Sharin Mons, PharmD, BCPS, BCIDP Infectious Diseases Clinical Pharmacist Phone: [phone] 10/03/2023, 9:06 AM

## 2023-10-03 NOTE — Progress Notes (Addendum)
[facility]  Date of Admission:  10/01/2023   Total days of inpatient antibiotics 2  Principal Problem:   Infection of total right knee replacement (HCC) Active Problems:   Infection of total right knee replacement, subsequent encounter          Assessment: 67 year old male with history of primary right TKA on 04/23/2023 complicated by worsening pain after yellowjacket bite in September 2024.  Underwent right knee aspiration on 8/11 with cultures growing MSSE.  He has been on amoxicillin since 10/7 due to sores around right knee.  Admitted with:  #R knee PJI SP resection RTKA and spacer placement on 10/9 -patient was seen by me, infectious disease on 9/18.  He had brought in lab work a right knee aspirate cultures growing MSSE, with 26,000 WBC, 88% neutrophils.  At that point there was no concern for overt signs of infection.  He was planned to have a right TKA as such plan was to hold antibiotics till OR.  In the interim patient called and noted some sores around his right knee as such consult to start amoxicillin till OR on 7 -Taken to Or with Dr. Linna Caprice, noted grossly purulent joint fluid Recommendations:  -Continue cefazolin, anticipate 6 weeks of abx -Follow OR cx for another day -ID f/u and opat orders is place    #Crohn's disease on Remicade - Followed by GI - Patient was consulted that Remicade needs to be held 2 weeks out from completing antibiotic therapy. Last dose of remicade on 7/25.  Dr. Luciana Axe is covering this weekend.   OPAT ORDERS:  Diagnosis: PJI SP spacer palcement  Culture Result: MSSE from knee aspirate weeks ago. OR cx  penidng  Allergies  Allergen Reactions   Sulfasalazine Other (See Comments)    Flu like feelings   Statins Other (See Comments)    REACTION: Myalgia     Discharge antibiotics to be given via PICC line:  Per pharmacy protocol cefazolin 2gm q8h    Duration: cefazolin End  Date: 11/12/23  [facility] Care Per Protocol with Biopatch Use: Home health RN for IV administration and teaching, line care and labs.    Labs weekly while on IV antibiotics: _x_ CBC with differential __ BMP **TWICE WEEKLY ON VANCOMYCIN  _x_ CMP _x_ CRP _x_ ESR __ Vancomycin trough TWICE WEEKLY __ CK  __ Please pull PIC at completion of IV antibiotics _x_ Please leave PIC in place until doctor has seen patient or been notified  Fax weekly labs to [phone]  Clinic Follow Up Appt: 11/11  @ [facility] with Dr.Iliani Vejar   Dr. Luciana Axe will be covering this weekend.  Microbiology:   Antibiotics: Amoxilicllin 10/7-10/10 Vancomycin 10/9-10/10  cefazolin 10/10- Cultures:     Other 10/9 OR cx Ngx2 days   SUBJECTIVE: Resting in bed. Reports his sores on arms and right foot have resolved Interval: Afebrile overnight. Wbc 10.7  Review of Systems: Review of Systems  All other systems reviewed and are negative.    Scheduled Meds:  aspirin  81 mg Oral BID   Chlorhexidine Gluconate Cloth  6 each Topical Daily   docusate sodium  100 mg Oral BID   ezetimibe  10 mg Oral Daily   pantoprazole  40 mg Oral Daily   rosuvastatin  20 mg Oral Daily   senna  1 tablet Oral BID   sodium chloride flush  10-40 mL Intracatheter Q12H   venlafaxine  37.5 mg Oral BID  Continuous Infusions:  sodium chloride 75 mL/hr at 10/03/23 0549    ceFAZolin (ANCEF) IV 2 g (10/03/23 2139)   methocarbamol (ROBAXIN) IV     PRN Meds:.acetaminophen, alum & mag hydroxide-simeth, bisacodyl, diphenhydrAMINE, HYDROmorphone (DILAUDID) injection, menthol-cetylpyridinium **OR** phenol, methocarbamol **OR** methocarbamol (ROBAXIN) IV, metoCLOPramide **OR** metoCLOPramide (REGLAN) injection, ondansetron **OR** ondansetron (ZOFRAN) IV, oxyCODONE, oxyCODONE, polyethylene glycol, sodium chloride flush Allergies  Allergen Reactions   Sulfasalazine Other (See Comments)    Flu like feelings   Statins Other (See Comments)     REACTION: Myalgia    OBJECTIVE: Vitals:   10/02/23 2213 10/03/23 0529 10/03/23 1314 10/03/23 2139  BP: 135/79 132/79 129/86 117/73  Pulse: 92 92 97 98  Resp: 17 17 20 18   Temp: 98.1 F (36.7 C) 97.7 F (36.5 C) 97.8 F (36.6 C) 98.2 F (36.8 C)  TempSrc: Oral Oral  Oral  SpO2: 92% 96% 95% 92%  Weight:      Height:       Body mass index is 29.09 kg/m.  Physical Exam Constitutional:      General: He is not in acute distress.    Appearance: He is normal weight. He is not toxic-appearing.  HENT:     Head: Normocephalic and atraumatic.     Right Ear: External ear normal.     Left Ear: External ear normal.     Nose: No congestion or rhinorrhea.     Mouth/Throat:     Mouth: Mucous membranes are moist.     Pharynx: Oropharynx is clear.  Eyes:     Extraocular Movements: Extraocular movements intact.     Conjunctiva/sclera: Conjunctivae normal.     Pupils: Pupils are equal, round, and reactive to light.  Cardiovascular:     Rate and Rhythm: Normal rate and regular rhythm.     Heart sounds: No murmur heard.    No friction rub. No gallop.  Pulmonary:     Effort: Pulmonary effort is normal.     Breath sounds: Normal breath sounds.  Abdominal:     General: Abdomen is flat. Bowel sounds are normal.     Palpations: Abdomen is soft.  Musculoskeletal:     Cervical back: Normal range of motion and neck supple.     Comments: Right foot bandaged  Skin:    General: Skin is warm and dry.  Neurological:     General: No focal deficit present.     Mental Status: He is oriented to person, place, and time.  Psychiatric:        Mood and Affect: Mood normal.       Lab Results Lab Results  Component Value Date   WBC 10.7 (H) 10/03/2023   HGB 8.4 (L) 10/03/2023   HCT 25.7 (L) 10/03/2023   MCV 92.1 10/03/2023   PLT 192 10/03/2023    Lab Results  Component Value Date   CREATININE 1.06 10/03/2023   BUN 21 10/03/2023   NA 137 10/03/2023   K 4.0 10/03/2023   CL 103 10/03/2023    CO2 26 10/03/2023    Lab Results  Component Value Date   ALT 54 (H) 09/10/2023   AST 29 09/10/2023   ALKPHOS 84 08/01/2023   BILITOT 0.4 09/10/2023        Danelle Earthly, MD [facility] 10/03/2023, 10:13 PM   I have personally spent 52 minutes involved in face-to-face and non-face-to-face activities for this patient on the day of the visit. Professional time spent  includes the following activities: Preparing to see the patient (review of tests), Obtaining and/or reviewing separately obtained history (admission/discharge record), Performing a medically appropriate examination and/or evaluation , Ordering medications/tests/procedures, referring and communicating with other health care professionals, Documenting clinical information in the EMR, Independently interpreting results (not separately reported), Communicating results to the patient/family/caregiver, Counseling and educating the patient/family/caregiver and Care coordination (not separately reported).

## 2023-10-03 NOTE — Progress Notes (Signed)
Physical Therapy Treatment Patient Details Name: Tyler Gates MRN: [id number] DOB: 08/31/1956 Today's Date: 10/03/2023   History of Present Illness Pt s/p R TKR resection with spacer placement.  Pt with hx of Chrohn's dz, L femur fx s/p pinning (2018) and R TKR (5/24)    PT Comments  Pt progressing well with mobility and eager for return home - potentially tomorrow.    If plan is discharge home, recommend the following: A little help with walking and/or transfers;A little help with bathing/dressing/bathroom;Assistance with cooking/housework;Assist for transportation;Help with stairs or ramp for entrance   Can travel by private vehicle        Equipment Recommendations  None recommended by PT    Recommendations for Other Services       Precautions / Restrictions Precautions Precautions: Fall Restrictions Weight Bearing Restrictions: Yes RLE Weight Bearing: Partial weight bearing RLE Partial Weight Bearing Percentage or Pounds: 50     Mobility  Bed Mobility Overal bed mobility: Needs Assistance Bed Mobility: Supine to Sit     Supine to sit: Min assist     General bed mobility comments: min assist to manage R LE only    Transfers Overall transfer level: Needs assistance Equipment used: Rolling walker (2 wheels) Transfers: Sit to/from Stand Sit to Stand: Contact guard assist           General transfer comment: cues for LE management and use of UEs to self assist    Ambulation/Gait Ambulation/Gait assistance: Contact guard assist, Supervision Gait Distance (Feet): 150 Feet Assistive device: Rolling walker (2 wheels) Gait Pattern/deviations: Step-to pattern, Decreased step length - right, Decreased step length - left, Shuffle, Trunk flexed Gait velocity: decr     General Gait Details: Min cues for sequence, posture, position from RW and PWB   Stairs Stairs: Yes Stairs assistance: Min assist Stair Management: One rail Left, Step to pattern, Forwards,  With crutches Number of Stairs: 4 General stair comments: two step x 2 with cues for sequence   Wheelchair Mobility     Tilt Bed    Modified Rankin (Stroke Patients Only)       Balance Overall balance assessment: Needs assistance Sitting-balance support: No upper extremity supported, Feet supported Sitting balance-Leahy Scale: Good     Standing balance support: No upper extremity supported Standing balance-Leahy Scale: Fair                              Cognition Arousal: Alert Behavior During Therapy: WFL for tasks assessed/performed Overall Cognitive Status: Within Functional Limits for tasks assessed                                          Exercises General Exercises - Lower Extremity Ankle Circles/Pumps: AROM, Both, 15 reps, Supine Quad Sets: AROM, Both, 10 reps, Supine Hip ABduction/ADduction: AAROM, Right, 10 reps, Supine Straight Leg Raises: AAROM, Right, 10 reps, Supine    General Comments        Pertinent Vitals/Pain Pain Assessment Pain Assessment: 0-10 Pain Score: 3  Pain Location: R knee Pain Descriptors / Indicators: Aching, Sore Pain Intervention(s): Limited activity within patient's tolerance, Monitored during session, Premedicated before session    Home Living                          Prior  Function            PT Goals (current goals can now be found in the care plan section) Acute Rehab PT Goals Patient Stated Goal: REgain IND PT Goal Formulation: With patient Time For Goal Achievement: 10/09/23 Potential to Achieve Goals: Good Progress towards PT goals: Progressing toward goals    Frequency    Min 1X/week      PT Plan      Co-evaluation              AM-PAC PT "6 Clicks" Mobility   Outcome Measure  Help needed turning from your back to your side while in a flat bed without using bedrails?: A Little Help needed moving from lying on your back to sitting on the side of a flat  bed without using bedrails?: A Little Help needed moving to and from a bed to a chair (including a wheelchair)?: A Little Help needed standing up from a chair using your arms (e.g., wheelchair or bedside chair)?: A Little Help needed to walk in hospital room?: A Little Help needed climbing 3-5 steps with a railing? : A Little 6 Click Score: 18    End of Session Equipment Utilized During Treatment: Gait belt Activity Tolerance: Patient tolerated treatment well Patient left: with call bell/phone within reach;in bed;with family/visitor present Nurse Communication: Mobility status PT Visit Diagnosis: Unsteadiness on feet (R26.81);Difficulty in walking, not elsewhere classified (R26.2)     Time: 1610-9604 PT Time Calculation (min) (ACUTE ONLY): 19 min  Charges:    $Gait Training: 8-22 mins $Therapeutic Exercise: 8-22 mins PT General Charges $$ ACUTE PT VISIT: 1 Visit                     Mauro Kaufmann PT Acute Rehabilitation Services Pager [phone] Office [phone]    [name] 10/03/2023, 12:48 PM

## 2023-10-04 DIAGNOSIS — T8453XD Infection and inflammatory reaction due to internal right knee prosthesis, subsequent encounter: Secondary | ICD-10-CM

## 2023-10-04 LAB — BASIC METABOLIC PANEL
Anion gap: 8 (ref 5–15)
BUN: 19 mg/dL (ref 8–23)
CO2: 28 mmol/L (ref 22–32)
Calcium: 8.5 mg/dL — ABNORMAL LOW (ref 8.9–10.3)
Chloride: 101 mmol/L (ref 98–111)
Creatinine, Ser: 1.08 mg/dL (ref 0.61–1.24)
GFR, Estimated: >60 mL/min (ref >60)
Glucose, Bld: 114 mg/dL — ABNORMAL HIGH (ref 70–99)
Potassium: 3.9 mmol/L (ref 3.5–5.1)
Sodium: 137 mmol/L (ref 135–145)

## 2023-10-04 LAB — CBC
HCT: 25.3 % — ABNORMAL LOW (ref 39.0–52.0)
Hemoglobin: 8.1 g/dL — ABNORMAL LOW (ref 13.0–17.0)
MCH: 29.7 pg (ref 26.0–34.0)
MCHC: 32 g/dL (ref 30.0–36.0)
MCV: 92.7 fL (ref 80.0–100.0)
Platelets: 199 10*3/uL (ref 150–400)
RBC: 2.73 MIL/uL — ABNORMAL LOW (ref 4.22–5.81)
RDW: 14.8 % (ref 11.5–15.5)
WBC: 8.3 10*3/uL (ref 4.0–10.5)
nRBC: 0 % (ref 0.0–0.2)

## 2023-10-04 MED ORDER — HEPARIN SOD (PORK) LOCK FLUSH 100 UNIT/ML IV SOLN
250.0000 [IU] | INTRAVENOUS | Status: AC | PRN
  Administered 2023-10-04: 250 [IU]

## 2023-10-04 NOTE — Progress Notes (Signed)
Brief ID note:  OR cultures remains negative and he remains on cefazolin. OPAT order in place. Will continue with cefazolin for 6 weeks.  Gardiner Barefoot, MD

## 2023-10-04 NOTE — Progress Notes (Signed)
Physical Therapy Treatment Patient Details Name: Tyler Gates MRN: [id number] DOB: June 07, 1956 Today's Date: 10/04/2023   History of Present Illness Pt s/p R TKR resection with spacer placement.  Pt with hx of Chrohn's dz, L femur fx s/p pinning (2018) and R TKR (5/24)    PT Comments  Pt in good spirits and progressing well with mobility.  Pt reviewed therex, reviewed Don/KI, ambulated increased distance in hall and negotiated stairs.  Pt hopeful for dc home this date.    If plan is discharge home, recommend the following: A little help with walking and/or transfers;A little help with bathing/dressing/bathroom;Assistance with cooking/housework;Assist for transportation;Help with stairs or ramp for entrance   Can travel by private vehicle        Equipment Recommendations  None recommended by PT    Recommendations for Other Services       Precautions / Restrictions Precautions Precautions: Fall Restrictions Weight Bearing Restrictions: Yes RLE Weight Bearing: Partial weight bearing RLE Partial Weight Bearing Percentage or Pounds: 50     Mobility  Bed Mobility               General bed mobility comments: UP in chair and requests back to same    Transfers Overall transfer level: Needs assistance Equipment used: Rolling walker (2 wheels) Transfers: Sit to/from Stand Sit to Stand: Supervision           General transfer comment: cues for LE management and use of UEs to self assist    Ambulation/Gait Ambulation/Gait assistance: Contact guard assist, Supervision Gait Distance (Feet): 200 Feet Assistive device: Rolling walker (2 wheels) Gait Pattern/deviations: Step-to pattern, Shuffle, Trunk flexed, Antalgic Gait velocity: decr     General Gait Details: Min cues for sequence, posture, position from RW and PWB   Stairs Stairs: Yes Stairs assistance: Min assist Stair Management: One rail Left, Step to pattern, Forwards, With cane Number of Stairs:  3 General stair comments: min cues for sequence   Wheelchair Mobility     Tilt Bed    Modified Rankin (Stroke Patients Only)       Balance Overall balance assessment: Needs assistance Sitting-balance support: No upper extremity supported, Feet supported Sitting balance-Leahy Scale: Good     Standing balance support: No upper extremity supported Standing balance-Leahy Scale: Fair                              Cognition Arousal: Alert Behavior During Therapy: WFL for tasks assessed/performed Overall Cognitive Status: Within Functional Limits for tasks assessed                                          Exercises General Exercises - Lower Extremity Ankle Circles/Pumps: AROM, Both, 15 reps, Supine Quad Sets: AROM, Both, 10 reps, Supine Hip ABduction/ADduction: AAROM, Right, 10 reps, Supine Straight Leg Raises: AAROM, Right, 10 reps, Supine    General Comments        Pertinent Vitals/Pain Pain Assessment Pain Assessment: 0-10 Pain Score: 3  Pain Location: R knee Pain Descriptors / Indicators: Aching, Sore Pain Intervention(s): Limited activity within patient's tolerance, Monitored during session, Premedicated before session, Ice applied    Home Living                          Prior Function  PT Goals (current goals can now be found in the care plan section) Acute Rehab PT Goals Patient Stated Goal: REgain IND PT Goal Formulation: With patient Time For Goal Achievement: 10/09/23 Potential to Achieve Goals: Good Progress towards PT goals: Progressing toward goals    Frequency    Min 1X/week      PT Plan      Co-evaluation              AM-PAC PT "6 Clicks" Mobility   Outcome Measure  Help needed turning from your back to your side while in a flat bed without using bedrails?: A Little Help needed moving from lying on your back to sitting on the side of a flat bed without using bedrails?: A  Little Help needed moving to and from a bed to a chair (including a wheelchair)?: A Little Help needed standing up from a chair using your arms (e.g., wheelchair or bedside chair)?: A Little Help needed to walk in hospital room?: A Little Help needed climbing 3-5 steps with a railing? : A Little 6 Click Score: 18    End of Session Equipment Utilized During Treatment: Gait belt Activity Tolerance: Patient tolerated treatment well Patient left: with call bell/phone within reach;in bed;in chair;with chair alarm set Nurse Communication: Mobility status PT Visit Diagnosis: Unsteadiness on feet (R26.81);Difficulty in walking, not elsewhere classified (R26.2)     Time: 1884-1660 PT Time Calculation (min) (ACUTE ONLY): 35 min  Charges:    $Gait Training: 8-22 mins $Therapeutic Activity: 8-22 mins PT General Charges $$ ACUTE PT VISIT: 1 Visit                     Mauro Kaufmann PT Acute Rehabilitation Services Pager [phone] Office [phone]    [facility] 10/04/2023, 12:34 PM

## 2023-10-04 NOTE — Progress Notes (Signed)
Subjective: 3 Days Post-Op Procedure(s) (LRB): EXCISIONAL TOTAL KNEE ARTHROPLASTY WITH PLACEMENT OF ARTICULATING ANTIBIOTIC SPACER (Right) Patient reports pain as 4 on 0-10 scale.   Denies CP or SOB.  Voiding without difficulty. Positive flatus. Objective: Vital signs in last 24 hours: Temp:  [97.7 F (36.5 C)-98.2 F (36.8 C)] 97.7 F (36.5 C) (10/12 0532) Pulse Rate:  [97-98] 97 (10/12 0532) Resp:  [17-20] 17 (10/12 0532) BP: (117-148)/(73-88) 148/88 (10/12 0532) SpO2:  [92 %-96 %] 96 % (10/12 0532)  Intake/Output from previous day: 10/11 0701 - 10/12 0700 In: 2218 [P.O.:1360; I.V.:458; IV Piggyback:400] Out: 1700 [Urine:1700] Intake/Output this shift: Total I/O In: -  Out: 300 [Urine:300]  Recent Labs    10/01/23 1409 10/02/23 0348 10/03/23 0430 10/04/23 0430  HGB 9.2* 8.4* 8.4* 8.1*   Recent Labs    10/03/23 0430 10/04/23 0430  WBC 10.7* 8.3  RBC 2.79* 2.73*  HCT 25.7* 25.3*  PLT 192 199   Recent Labs    10/03/23 0430 10/04/23 0430  NA 137 137  K 4.0 3.9  CL 103 101  CO2 26 28  BUN 21 19  CREATININE 1.06 1.08  GLUCOSE 110* 114*  CALCIUM 8.6* 8.5*   No results for input(s): "LABPT", "INR" in the last 72 hours.  Neurologically intact Sensation intact distally Intact pulses distally Incision: dressing C/D/I No cellulitis present Compartment soft  Assessment/Plan:  3 Days Post-Op Procedure(s) (LRB): EXCISIONAL TOTAL KNEE ARTHROPLASTY WITH PLACEMENT OF ARTICULATING ANTIBIOTIC SPACER (Right) {ZOXW:9604540}   Principal Problem:   Infection of total right knee replacement (HCC) Active Problems:   Infection of total right knee replacement, subsequent encounter      Tyler Gates 10/04/2023, @NOWSubjective : 3 Days Post-Op Procedure(s) (LRB): EXCISIONAL TOTAL KNEE ARTHROPLASTY WITH PLACEMENT OF ARTICULATING ANTIBIOTIC SPACER (Right) Patient reports pain as {pain:3041404}.   Denies CP or SOB.  Voiding without difficulty. Positive  flatus. Objective: Vital signs in last 24 hours: Temp:  [97.7 F (36.5 C)-98.2 F (36.8 C)] 97.7 F (36.5 C) (10/12 0532) Pulse Rate:  [97-98] 97 (10/12 0532) Resp:  [17-20] 17 (10/12 0532) BP: (117-148)/(73-88) 148/88 (10/12 0532) SpO2:  [92 %-96 %] 96 % (10/12 0532)  Intake/Output from previous day: 10/11 0701 - 10/12 0700 In: 2218 [P.O.:1360; I.V.:458; IV Piggyback:400] Out: 1700 [Urine:1700] Intake/Output this shift: Total I/O In: -  Out: 300 [Urine:300]  Recent Labs    10/01/23 1409 10/02/23 0348 10/03/23 0430 10/04/23 0430  HGB 9.2* 8.4* 8.4* 8.1*   Recent Labs    10/03/23 0430 10/04/23 0430  WBC 10.7* 8.3  RBC 2.79* 2.73*  HCT 25.7* 25.3*  PLT 192 199   Recent Labs    10/03/23 0430 10/04/23 0430  NA 137 137  K 4.0 3.9  CL 103 101  CO2 26 28  BUN 21 19  CREATININE 1.06 1.08  GLUCOSE 110* 114*  CALCIUM 8.6* 8.5*   No results for input(s): "LABPT", "INR" in the last 72 hours.  {physical exam:3041401}  Assessment/Plan:  3 Days Post-Op Procedure(s) (LRB): EXCISIONAL TOTAL KNEE ARTHROPLASTY WITH PLACEMENT OF ARTICULATING ANTIBIOTIC SPACER (Right) {JWJX:9147829}   Principal Problem:   Infection of total right knee replacement (HCC) Active Problems:   Infection of total right knee replacement, subsequent encounter      Tyler Gates 10/04/2023, @NOWSubjective : 3 Days Post-Op Procedure(s) (LRB): EXCISIONAL TOTAL KNEE ARTHROPLASTY WITH PLACEMENT OF ARTICULATING ANTIBIOTIC SPACER (Right) Patient reports pain as {pain:3041404}.   Denies CP or SOB.  Voiding without difficulty. Positive flatus. Objective: Vital  signs in last 24 hours: Temp:  [97.7 F (36.5 C)-98.2 F (36.8 C)] 97.7 F (36.5 C) (10/12 0532) Pulse Rate:  [97-98] 97 (10/12 0532) Resp:  [17-20] 17 (10/12 0532) BP: (117-148)/(73-88) 148/88 (10/12 0532) SpO2:  [92 %-96 %] 96 % (10/12 0532)  Intake/Output from previous day: 10/11 0701 - 10/12 0700 In: 2218 [P.O.:1360;  I.V.:458; IV Piggyback:400] Out: 1700 [Urine:1700] Intake/Output this shift: Total I/O In: -  Out: 300 [Urine:300]  Recent Labs    10/01/23 1409 10/02/23 0348 10/03/23 0430 10/04/23 0430  HGB 9.2* 8.4* 8.4* 8.1*   Recent Labs    10/03/23 0430 10/04/23 0430  WBC 10.7* 8.3  RBC 2.79* 2.73*  HCT 25.7* 25.3*  PLT 192 199   Recent Labs    10/03/23 0430 10/04/23 0430  NA 137 137  K 4.0 3.9  CL 103 101  CO2 26 28  BUN 21 19  CREATININE 1.06 1.08  GLUCOSE 110* 114*  CALCIUM 8.6* 8.5*   No results for input(s): "LABPT", "INR" in the last 72 hours.  {physical exam:3041401}  Assessment/Plan:  3 Days Post-Op Procedure(s) (LRB): EXCISIONAL TOTAL KNEE ARTHROPLASTY WITH PLACEMENT OF ARTICULATING ANTIBIOTIC SPACER (Right) {ZOXW:9604540}   Principal Problem:   Infection of total right knee replacement (HCC) Active Problems:   Infection of total right knee replacement, subsequent encounter      Tyler Gates 10/04/2023, @NOW

## 2023-10-04 NOTE — TOC Transition Note (Signed)
Transition of Care [facility]) - CM/SW Discharge Note   Patient Details  Name: Tyler Gates MRN: [id number] Date of Birth: 1956-10-27  Transition of Care [facility] CM/SW Contact:  Darleene Cleaver, LCSW Phone Number: 10/04/2023, 5:08 PM   Clinical Narrative:     Patient will be going home with home health through [facility] for an RN and [facility] for home IV antibiotics. TOC signing off please reconsult with any other TOC needs, home health agency has been notified of planned discharge.   Final next level of care: Home w Home Health Services Barriers to Discharge: Barriers Resolved   Patient Goals and CMS Choice CMS Medicare.gov Compare Post Acute Care list provided to:: Patient Represenative (must comment) Choice offered to / list presented to : Spouse  Discharge Placement                         Discharge Plan and Services Additional resources added to the After Visit Summary for   In-house Referral: Clinical Social Work   Post Acute Care Choice: Home Health          DME Arranged: IV pump/equipment DME Agency: Other - [facility]) Date DME Agency Contacted: 10/04/23 Time DME Agency Contacted: 1400 Representative spoke with at DME Agency: Pam HH Arranged: RN HH Agency: [facility] Date [facility] Agency Contacted: 10/04/23 Time HH Agency Contacted: 1430 Representative spoke with at [facility] Agency: Kandee Keen  Social Determinants of Health (SDOH) Interventions SDOH Screenings   Food Insecurity: No Food Insecurity (10/01/2023)  Housing: Low Risk  (10/01/2023)  Transportation Needs: No Transportation Needs (10/01/2023)  Utilities: Not At Risk (10/01/2023)  Alcohol Screen: Low Risk  (08/12/2023)  Depression (PHQ2-9): Low Risk  (09/10/2023)  Financial Resource Strain: Low Risk  (08/12/2023)  Physical Activity: Sufficiently Active (08/12/2023)  Social Connections: Moderately Isolated (08/12/2023)  Stress: No Stress Concern Present (08/12/2023)  Tobacco Use: Low Risk  (10/01/2023)   Health Literacy: Adequate Health Literacy (08/12/2023)     Readmission Risk Interventions    10/02/2023    2:37 PM  Readmission Risk Prevention Plan  Post Dischage Appt Complete  Medication Screening Complete  Transportation Screening Complete

## 2023-10-04 NOTE — Progress Notes (Signed)
Subjective: 3 Days Post-Op Procedure(s) (LRB): EXCISIONAL TOTAL KNEE ARTHROPLASTY WITH PLACEMENT OF ARTICULATING ANTIBIOTIC SPACER (Right) Patient reports pain as 3 on 0-10 scale.   Denies CP or SOB.  Voiding without difficulty. Positive flatus. No CP or SOB Objective: Vital signs in last 24 hours: Temp:  [97.7 F (36.5 C)-98.2 F (36.8 C)] 97.7 F (36.5 C) (10/12 0532) Pulse Rate:  [97-98] 97 (10/12 0532) Resp:  [17-20] 17 (10/12 0532) BP: (117-148)/(73-88) 148/88 (10/12 0532) SpO2:  [92 %-96 %] 96 % (10/12 0532)  Intake/Output from previous day: 10/11 0701 - 10/12 0700 In: 2218 [P.O.:1360; I.V.:458; IV Piggyback:400] Out: 1700 [Urine:1700] Intake/Output this shift: Total I/O In: -  Out: 300 [Urine:300]  Recent Labs    10/01/23 1409 10/02/23 0348 10/03/23 0430 10/04/23 0430  HGB 9.2* 8.4* 8.4* 8.1*   Recent Labs    10/03/23 0430 10/04/23 0430  WBC 10.7* 8.3  RBC 2.79* 2.73*  HCT 25.7* 25.3*  PLT 192 199   Recent Labs    10/03/23 0430 10/04/23 0430  NA 137 137  K 4.0 3.9  CL 103 101  CO2 26 28  BUN 21 19  CREATININE 1.06 1.08  GLUCOSE 110* 114*  CALCIUM 8.6* 8.5*   No results for input(s): "LABPT", "INR" in the last 72 hours.  Neurologically intact Intact pulses distally Dorsiflexion/Plantar flexion intact Compartment soft No DVT  Assessment/Plan:  3 Days Post-Op Procedure(s) (LRB): EXCISIONAL TOTAL KNEE ARTHROPLASTY WITH PLACEMENT OF ARTICULATING ANTIBIOTIC SPACER (Right) Discharge home with home health If cultures neg. ID to see F/U 1 week   Principal Problem:   Infection of total right knee replacement (HCC) Active Problems:   Infection of total right knee replacement, subsequent encounter      Javier Docker 10/04/2023, @NOWSubjective : 3 Days Post-Op Procedure(s) (LRB): EXCISIONAL TOTAL KNEE ARTHROPLASTY WITH PLACEMENT OF ARTICULATING ANTIBIOTIC SPACER (Right) Patient reports pain as 3 on 0-10 scale.   Denies CP or SOB.   Voiding without difficulty. Positive flatus. Objective: Vital signs in last 24 hours: Temp:  [97.7 F (36.5 C)-98.2 F (36.8 C)] 97.7 F (36.5 C) (10/12 0532) Pulse Rate:  [97-98] 97 (10/12 0532) Resp:  [17-20] 17 (10/12 0532) BP: (117-148)/(73-88) 148/88 (10/12 0532) SpO2:  [92 %-96 %] 96 % (10/12 0532)  Intake/Output from previous day: 10/11 0701 - 10/12 0700 In: 2218 [P.O.:1360; I.V.:458; IV Piggyback:400] Out: 1700 [Urine:1700] Intake/Output this shift: Total I/O In: -  Out: 300 [Urine:300]  Recent Labs    10/01/23 1409 10/02/23 0348 10/03/23 0430 10/04/23 0430  HGB 9.2* 8.4* 8.4* 8.1*   Recent Labs    10/03/23 0430 10/04/23 0430  WBC 10.7* 8.3  RBC 2.79* 2.73*  HCT 25.7* 25.3*  PLT 192 199   Recent Labs    10/03/23 0430 10/04/23 0430  NA 137 137  K 4.0 3.9  CL 103 101  CO2 26 28  BUN 21 19  CREATININE 1.06 1.08  GLUCOSE 110* 114*  CALCIUM 8.6* 8.5*   No results for input(s): "LABPT", "INR" in the last 72 hours.  Neurologically intact  Assessment/Plan:  3 Days Post-Op Procedure(s) (LRB): EXCISIONAL TOTAL KNEE ARTHROPLASTY WITH PLACEMENT OF ARTICULATING ANTIBIOTIC SPACER (Right) Discharge to SNF   Principal Problem:   Infection of total right knee replacement (HCC) Active Problems:   Infection of total right knee replacement, subsequent encounter      Javier Docker 10/04/2023, @NOW 

## 2023-10-04 NOTE — TOC Progression Note (Signed)
Transition of Care [facility]) - Progression Note    Patient Details  Name: Tyler Gates MRN: [id number] Date of Birth: 1956/10/14  Transition of Care [facility]) CM/SW Contact  Darleene Cleaver, [place] Phone Number: 10/04/2023, 2:03 PM  Clinical Narrative:     CSW was informed that patient will be going home with Home IV antibiotics.  Per Pam at [facility], the meds are ready for patient to discharge back home, and [facility] is set up.  CSW sent message to bedside nurse to find out if patient is discharging today.  TOC to continue to follow.  Expected Discharge Plan: Home w Home Health Services Barriers to Discharge: No Barriers Identified  Expected Discharge Plan and Services In-house Referral: Clinical Social Work   Post Acute Care Choice: Home Health Living arrangements for the past 2 months: Independent Living Facility                           HH Arranged: IV Antibiotics, RN HH Agency: [facility] Date HH Agency Contacted: 10/02/23   Representative spoke with at [facility] Agency: Pam C.   Social Determinants of Health (SDOH) Interventions SDOH Screenings   Food Insecurity: No Food Insecurity (10/01/2023)  Housing: Low Risk  (10/01/2023)  Transportation Needs: No Transportation Needs (10/01/2023)  Utilities: Not At Risk (10/01/2023)  Alcohol Screen: Low Risk  (08/12/2023)  Depression (PHQ2-9): Low Risk  (09/10/2023)  Financial Resource Strain: Low Risk  (08/12/2023)  Physical Activity: Sufficiently Active (08/12/2023)  Social Connections: Moderately Isolated (08/12/2023)  Stress: No Stress Concern Present (08/12/2023)  Tobacco Use: Low Risk  (10/01/2023)  Health Literacy: Adequate Health Literacy (08/12/2023)    Readmission Risk Interventions    10/02/2023    2:37 PM  Readmission Risk Prevention Plan  Post Dischage Appt Complete  Medication Screening Complete  Transportation Screening Complete

## 2023-10-06 ENCOUNTER — Encounter (HOSPITAL_COMMUNITY): Payer: Self-pay | Admitting: Orthopedic Surgery

## 2023-10-06 ENCOUNTER — Encounter: Payer: Self-pay | Admitting: Internal Medicine

## 2023-10-06 ENCOUNTER — Telehealth: Payer: Self-pay | Admitting: *Deleted

## 2023-10-06 DIAGNOSIS — E785 Hyperlipidemia, unspecified: Secondary | ICD-10-CM | POA: Diagnosis not present

## 2023-10-06 DIAGNOSIS — G4733 Obstructive sleep apnea (adult) (pediatric): Secondary | ICD-10-CM | POA: Diagnosis not present

## 2023-10-06 DIAGNOSIS — B957 Other staphylococcus as the cause of diseases classified elsewhere: Secondary | ICD-10-CM | POA: Diagnosis not present

## 2023-10-06 DIAGNOSIS — K449 Diaphragmatic hernia without obstruction or gangrene: Secondary | ICD-10-CM | POA: Diagnosis not present

## 2023-10-06 DIAGNOSIS — K2100 Gastro-esophageal reflux disease with esophagitis, without bleeding: Secondary | ICD-10-CM | POA: Diagnosis not present

## 2023-10-06 DIAGNOSIS — I10 Essential (primary) hypertension: Secondary | ICD-10-CM | POA: Diagnosis not present

## 2023-10-06 DIAGNOSIS — F32A Depression, unspecified: Secondary | ICD-10-CM | POA: Diagnosis not present

## 2023-10-06 DIAGNOSIS — T8453XA Infection and inflammatory reaction due to internal right knee prosthesis, initial encounter: Secondary | ICD-10-CM | POA: Diagnosis not present

## 2023-10-06 DIAGNOSIS — K5090 Crohn's disease, unspecified, without complications: Secondary | ICD-10-CM | POA: Diagnosis not present

## 2023-10-06 LAB — AEROBIC/ANAEROBIC CULTURE W GRAM STAIN (SURGICAL/DEEP WOUND): Culture: NO GROWTH

## 2023-10-06 NOTE — Transitions of Care (Post Inpatient/ED Visit) (Signed)
10/06/2023  Name: Tyler Gates MRN: [id number] DOB: [birth year]  Today's TOC FU Call Status: Today's TOC FU Call Status:: Successful TOC FU Call Completed TOC FU Call Complete Date: 10/06/23 Patient's Name and Date of Birth confirmed.  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  The home health and IV nurses are comihng out and I think I have plenty of support here; they have been here already this morning") Any questions or concerns?: No  Items Reviewed: Did you receive and understand the discharge instructions provided?: Yes (thoroughly reviewed with patient who verbalizes good understanding of same) Medications obtained,verified, and reconciled?: Partial Review Completed (Partial medication review completed; confirmed patient obtained/ is taking newly Rx'd IV antibiotic medication as instructed; self-manages medications and denies questions/ concerns around medications today; wife assisting as needed) Medications Not Reviewed Reasons:: Other: Reason for Partial Mediation Review: Patient declined- stated "The nurse from [facility] was here just a little while ago and went over all of the medicines; we are just starting the IV antibiotic dose for this afternoon now, I don't need to go over the medications again" Any new allergies since your discharge?: No Dietary orders reviewed?: Yes Type of Diet Ordered:: "Regular" Do you have support at home?: Yes People in Home: spouse Name of Support/Comfort Primary Source: Reports essentially independent in self-care activities; supportive spouse assists as/ if needed/  indicated  Medications Reviewed Today: Medications Reviewed Today     Reviewed by Michaela Corner, RN (Registered Nurse) on 10/06/23 at 1545  Med List Status: <None>   Medication Order Taking? Sig Documenting Provider Last Dose Status Informant  amLODipine (NORVASC) 5 MG tablet [id number]  TAKE 2 TABLETS BY MOUTH EVERY DAY  Patient taking differently: Take 7.5 mg by mouth daily.   Lewayne Bunting, MD  Active Self  aspirin (ASPIRIN CHILDRENS) 81 MG chewable tablet [id number]  Chew 1 tablet (81 mg total) by mouth 2 (two) times daily with a meal. Hill, Alain Honey, PA-C  Active   ceFAZolin (ANCEF) IVPB [id number] Yes Inject 2 g into the vein every 8 (eight) hours. Indication:  MSSE prosthetic knee infection  First Dose: Yes Last Day of Therapy:  11/12/2023 Labs - Once weekly:  CBC/D and BMP, Labs - Once weekly: ESR and CRP Method of administration: IV Push Method of administration may be changed at the discretion of home infusion pharmacist based upon assessment of the patient and/or caregiver's ability to self-administer the medication ordered. Danelle Earthly, MD Taking Active   docusate sodium (COLACE) 100 MG capsule [id number]  Take 1 capsule (100 mg total) by mouth 2 (two) times daily. [address], [place]  Active   ezetimibe (ZETIA) 10 MG tablet [id number]  TAKE 1 TABLET BY MOUTH EVERY DAY Hilty, Lisette Abu, MD  Active Self  loperamide (IMODIUM) 2 MG capsule [id number]  Take 2 mg by mouth as needed for diarrhea or loose stools. [provider]  Active Self  meloxicam (MOBIC) 15 MG tablet [id number]  Take 15 mg by mouth daily. [provider]  Active Self  methocarbamol (ROBAXIN) 500 MG tablet [id number]  Take 1 tablet (500 mg total) by mouth every 6 (six) hours as needed. Clint Bolder  S, PA-C  Active   olmesartan (BENICAR) 20 MG tablet [id number]  TAKE 1 TABLET BY MOUTH EVERY DAY Crenshaw, Madolyn Frieze, MD  Active Self  omeprazole (PRILOSEC) 40 MG capsule [id number]  TAKE 1 CAPSULE (40 MG  TOTAL) BY MOUTH IN THE MORNING AND AT BEDTIME. Everrett Coombe, DO  Active Self  ondansetron (ZOFRAN) 4 MG tablet [id number]  Take 1 tablet (4 mg total) by mouth every 8 (eight) hours as needed for nausea or vomiting. [address], [place]  Active   oxyCODONE (ROXICODONE) 5 MG immediate release tablet [id number]  Take 1-2 tablets (5-10 mg total) by mouth every 4 (four) hours as needed for severe pain. [address] [place] S, PA-C  Active   polyethylene glycol (MIRALAX) 17 g packet [id number]  Take 17 g by mouth daily as needed for mild constipation or moderate constipation. [address], [place]  Active   rosuvastatin (CRESTOR) 20 MG tablet [id number]  TAKE 1 TABLET BY MOUTH EVERY DAY Crenshaw, Madolyn Frieze, MD  Active Self  senna (SENOKOT) 8.6 MG TABS tablet [id number]  Take 2 tablets (17.2 mg total) by mouth at bedtime for 15 days. Clois Dupes, [place]  Active   venlafaxine (EFFEXOR) 37.5 MG tablet [id number]  TAKE 1 TABLET BY MOUTH TWICE A DAY Everrett Coombe, DO  Active Self           Home Care and Equipment/Supplies: Were Home Health Services Ordered?: Yes Name of Home Health Agency:: [facility]- confirmed both agencies have visited patient and that he has contact information for both agencies Has Agency set up a time to come to your home?: Yes First Home Health Visit Date: 10/06/23 [facility]; reports [facility] was involved "even before we were home from hospital") Any new equipment or medical supplies ordered?: No  Functional Questionnaire: Do you need assistance with bathing/showering or dressing?: No (wife assisting as indicated) Do you need assistance with meal preparation?: No (wife assists) Do you need assistance with eating?: No Do you have difficulty maintaining continence: No Do you need assistance with getting out of bed/getting out of a chair/moving?: No (wife assisting as indicated) Do you have difficulty managing or taking your medications?: No (wife assisting post recent  surgery)  Follow up appointments reviewed: PCP Follow-up appointment confirmed?: NA (verified not indicated per hospital discharging provider discharge notes) Specialist Hospital Follow-up appointment confirmed?: Yes Date of Specialist follow-up appointment?: 10/10/23 Follow-Up Specialty Provider:: Orthopedic surgical group Do you need transportation to your follow-up appointment?: No Do you understand care options if your condition(s) worsen?: Yes-patient verbalized understanding  SDOH Interventions Today    Flowsheet Row Most Recent Value  SDOH Interventions   Food Insecurity Interventions Intervention Not Indicated  Transportation Interventions Intervention Not Indicated  [normally drives self,  wife assisting as indicated post-recent surgery]      TOC Interventions Today    Flowsheet Row Most Recent Value  TOC Interventions   TOC Interventions Discussed/Reviewed TOC Interventions Discussed  [Patient declines need for ongoing/ further care management/ coordination outreach,  declines enrollment in 30-day TOC program]      Interventions Today    Flowsheet Row Most Recent Value  Chronic Disease   Chronic disease during today's visit Other  [post- TKR infection- surgical placement of antibiotic spacer]  General Interventions   General Interventions Discussed/Reviewed General Interventions Discussed, Doctor Visits, Durable Medical Equipment (DME)  Doctor Visits Discussed/Reviewed Doctor Visits Discussed, PCP, Specialist  Durable Medical Equipment (DME) Other, Val Riles currently requiring/ using  assistive devices for ambulation post-recent surgery- using cane/ "mostly walker" as needed,  confirms he has all IV equipment as supplied by [facility] IV agency]  PCP/Specialist Visits Compliance with follow-up visit  Exercise Interventions   Exercise Discussed/Reviewed Exercise Discussed  Education Interventions   Education Provided Provided Education  Provided Verbal  Education On Other, When to see the doctor  Penny Pia of home health services]  Nutrition Interventions   Nutrition Discussed/Reviewed Nutrition Discussed  Pharmacy Interventions   Pharmacy Dicussed/Reviewed Pharmacy Topics Discussed  Safety Interventions   Safety Discussed/Reviewed Safety Discussed      Caryl Pina, RN, BSN, CCRN Alumnus RN Care Manager  Transitions of Care  [facility] - Population Health  [place] [phone]: direct office

## 2023-10-08 ENCOUNTER — Encounter: Payer: Self-pay | Admitting: Family Medicine

## 2023-10-08 ENCOUNTER — Encounter: Payer: Self-pay | Admitting: Internal Medicine

## 2023-10-09 NOTE — Discharge Summary (Signed)
Physician Discharge Summary  Patient ID: Tyler Gates MRN: [id number] DOB/AGE: 1956/05/27 67 y.o.  Admit date: 10/01/2023 Discharge date: 10/04/2023  Admission Diagnoses:  Infection of total right knee replacement [facility])  Discharge Diagnoses:  Principal Problem:   Infection of total right knee replacement (HCC) Active Problems:   Infection of total right knee replacement, subsequent encounter   Past Medical History:  Diagnosis Date   Arthritis    Crohn's disease (HCC)    Depression    Dyspnea on exertion    GERD (gastroesophageal reflux disease)    Herpes    History of kidney stones    Hyperlipidemia    Hypertension    Instability of knee joint    Left displaced femoral neck fracture (HCC) 07/11/2017   OSA (obstructive sleep apnea)    mild   Snoring     Surgeries: Procedure(s): EXCISIONAL TOTAL KNEE ARTHROPLASTY WITH PLACEMENT OF ARTICULATING ANTIBIOTIC SPACER on 10/01/2023   Consultants (if any):   Discharged Condition: Improved  Hospital Course: Tyler Gates is an 67 y.o. male who was admitted 10/01/2023 with a diagnosis of Infection of total right knee replacement (HCC) and went to the operating room on 10/01/2023 and underwent the above named procedures.    He was given perioperative antibiotics:  Anti-infectives (From admission, onward)    Start     Dose/Rate Route Frequency Ordered Stop   10/03/23 0000  ceFAZolin (ANCEF) IVPB        2 g Intravenous Every 8 hours 10/03/23 0958 11/12/23 2359   10/02/23 1500  ceFAZolin (ANCEF) IVPB 2g/100 mL premix  Status:  Discontinued        2 g 200 mL/hr over 30 Minutes Intravenous Every 8 hours 10/02/23 1329 10/04/23 2220   10/02/23 0800  vancomycin (VANCOREADY) IVPB 1750 mg/350 mL  Status:  Discontinued        1,750 mg 175 mL/hr over 120 Minutes Intravenous Every 24 hours 10/01/23 1815 10/02/23 1329   10/01/23 2200  amoxicillin (AMOXIL) capsule 1,000 mg  Status:  Discontinued        1,000 mg Oral 2 times daily  10/01/23 1727 10/02/23 1329   10/01/23 1815  ceFAZolin (ANCEF) IVPB 2g/100 mL premix        2 g 200 mL/hr over 30 Minutes Intravenous Every 6 hours 10/01/23 1727 10/02/23 0044   10/01/23 1035  vancomycin (VANCOCIN) powder  Status:  Discontinued          As needed 10/01/23 1037 10/01/23 1726   10/01/23 0645  ceFAZolin (ANCEF) IVPB 2g/100 mL premix  Status:  Discontinued        2 g 200 mL/hr over 30 Minutes Intravenous On call to O.R. 10/01/23 6295 10/01/23 1726   10/01/23 0645  vancomycin (VANCOCIN) IVPB 1000 mg/200 mL premix        1,000 mg 200 mL/hr over 60 Minutes Intravenous  Once 10/01/23 2841 10/01/23 0834       He was given sequential compression devices, early ambulation, and aspirin for DVT prophylaxis.  POD#1 Hemovac drain intact. Prevena negative pressure dressing C/D/I . ID consulted for IV antibiotic selection and duration. Patient with known staph epi via synovasure. Intraoperative cultures pending. Patient started on IV cefazolin. PICC line placed. TDWB RLE with KI and walker. He ambulated 70 and 150 feet with PT.  POD#2 Hemovac drain removed. Prevena negative pressure dressing continued. IV antibiotic continued. Order for [facility] placed. Patient ambulated 150 feet with PT. Patient kept for additional day to follow intraoperative  cultures.  POD#3 Patient discharged home with [facility]. House vac exchanged for portable prevena negative pressure vac. Patient to follow-up within 7 days for removal of prevena negative pressure incisional dressing. Patient to hold remicade until completion of IV antibiotics and incision healed.   He benefited maximally from the hospital stay and there were no complications.    Recent vital signs:  Vitals:   10/04/23 0532 10/04/23 1358  BP: (!) 148/88 113/72  Pulse: 97 (!) 103  Resp: 17 16  Temp: 97.7 F (36.5 C) 98 F (36.7 C)  SpO2: 96% 91%    Recent laboratory studies:  Lab Results  Component Value Date   HGB 8.1 (L) 10/04/2023   HGB  8.4 (L) 10/03/2023   HGB 8.4 (L) 10/02/2023   Lab Results  Component Value Date   WBC 8.3 10/04/2023   PLT 199 10/04/2023   No results found for: "INR" Lab Results  Component Value Date   NA 137 10/04/2023   K 3.9 10/04/2023   CL 101 10/04/2023   CO2 28 10/04/2023   BUN 19 10/04/2023   CREATININE 1.08 10/04/2023   GLUCOSE 114 (H) 10/04/2023   Results for orders placed or performed during the hospital encounter of 10/01/23  Aerobic/Anaerobic Culture w Gram Stain (surgical/deep wound)     Status: None   Collection Time: 10/01/23 10:21 AM   Specimen: Soft Tissue, Other  Result Value Ref Range Status   Specimen Description   Final    TISSUE RIGHT KNEE Performed at [facility], [address], [place], [place] [address]    Special Requests   Final    NONE Performed at [facility], [address] [place], [place] [address]    Gram Stain   Final    RARE WBC PRESENT, PREDOMINANTLY PMN NO ORGANISMS SEEN    Culture   Final    No growth aerobically or anaerobically. Performed at [facility], [address] [place], [place] [address]    Report Status 10/06/2023 FINAL  Final     Allergies as of 10/04/2023       Reactions   Sulfasalazine Other (See Comments)   Flu like feelings   Statins Other (See Comments)   REACTION: Myalgia        Medication List     STOP taking these medications    amoxicillin 500 MG capsule Commonly known as: AMOXIL   Remicade 100 MG injection Generic drug: inFLIXimab   traMADol 50 MG tablet Commonly known as: ULTRAM   triamcinolone ointment 0.1 % Commonly known as: KENALOG       TAKE these medications    amLODipine 5 MG tablet Commonly known as: NORVASC TAKE 2 TABLETS BY MOUTH EVERY DAY What changed: how much to take   aspirin 81 MG chewable tablet Commonly known as: Aspirin Childrens Chew 1 tablet (81 mg total) by mouth 2 (two) times daily with a meal.   ceFAZolin  IVPB Commonly known as: ANCEF Inject 2 g into the vein every 8 (eight) hours. Indication:  MSSE prosthetic knee infection  First Dose: Yes Last Day of Therapy:  11/12/2023 Labs - Once weekly:  CBC/D and BMP, Labs - Once weekly: ESR and CRP Method of administration: IV Push Method of administration may be changed at the discretion of home infusion pharmacist based upon assessment of the patient and/or caregiver's ability to self-administer the medication ordered.   docusate sodium 100 MG capsule Commonly known as: Colace Take 1  capsule (100 mg total) by mouth 2 (two) times daily.   ezetimibe 10 MG tablet Commonly known as: ZETIA TAKE 1 TABLET BY MOUTH EVERY DAY   loperamide 2 MG capsule Commonly known as: IMODIUM Take 2 mg by mouth as needed for diarrhea or loose stools.   meloxicam 15 MG tablet Commonly known as: MOBIC Take 15 mg by mouth daily.   methocarbamol 500 MG tablet Commonly known as: ROBAXIN Take 1 tablet (500 mg total) by mouth every 6 (six) hours as needed.   olmesartan 20 MG tablet Commonly known as: BENICAR TAKE 1 TABLET BY MOUTH EVERY DAY   omeprazole 40 MG capsule Commonly known as: PRILOSEC TAKE 1 CAPSULE (40 MG TOTAL) BY MOUTH IN THE MORNING AND AT BEDTIME.   ondansetron 4 MG tablet Commonly known as: Zofran Take 1 tablet (4 mg total) by mouth every 8 (eight) hours as needed for nausea or vomiting.   oxyCODONE 5 MG immediate release tablet Commonly known as: Roxicodone Take 1-2 tablets (5-10 mg total) by mouth every 4 (four) hours as needed for severe pain.   polyethylene glycol 17 g packet Commonly known as: MiraLax Take 17 g by mouth daily as needed for mild constipation or moderate constipation.   rosuvastatin 20 MG tablet Commonly known as: CRESTOR TAKE 1 TABLET BY MOUTH EVERY DAY   senna 8.6 MG Tabs tablet Commonly known as: SENOKOT Take 2 tablets (17.2 mg total) by mouth at bedtime for 15 days.   venlafaxine 37.5 MG tablet Commonly  known as: EFFEXOR TAKE 1 TABLET BY MOUTH TWICE A DAY               Discharge Care Instructions  (From admission, onward)           Start     Ordered   10/03/23 0000  Change dressing on IV access line weekly and PRN  (Home infusion instructions - [facility] )        10/03/23 0958              WEIGHT BEARING   Other:  50% weightbearing on RLE with KI and walker.    EXERCISES  Results after joint replacement surgery are often greatly improved when you follow the exercise, range of motion and muscle strengthening exercises prescribed by your doctor. Safety measures are also important to protect the joint from further injury. Any time any of these exercises cause you to have increased pain or swelling, decrease what you are doing until you are comfortable again and then slowly increase them. If you have problems or questions, call your caregiver or physical therapist for advice.   Rehabilitation is important following a joint replacement. After just a few days of immobilization, the muscles of the leg can become weakened and shrink (atrophy).  These exercises are designed to build up the tone and strength of the thigh and leg muscles and to improve motion. Often times heat used for twenty to thirty minutes before working out will loosen up your tissues and help with improving the range of motion but do not use heat for the first two weeks following surgery (sometimes heat can increase post-operative swelling).   These exercises can be done on a training (exercise) mat, on the floor, on a table or on a bed. Use whatever works the best and is most comfortable for you.    Use music or television while you are exercising so that the exercises are a pleasant break in your day.  This will make your life better with the exercises acting as a break in your routine that you can look forward to.   Perform all exercises about fifteen times, three times per day or as directed.  You  should exercise both the operative leg and the other leg as well.  Exercises include:   Quad Sets - Tighten up the muscle on the front of the thigh (Quad) and hold for 5-10 seconds.   Straight Leg Raises - With your knee straight (if you were given a brace, keep it on), lift the leg to 60 degrees, hold for 3 seconds, and slowly lower the leg.  Perform this exercise against resistance later as your leg gets stronger.  Leg Slides: Lying on your back, slowly slide your foot toward your buttocks, bending your knee up off the floor (only go as far as is comfortable). Then slowly slide your foot back down until your leg is flat on the floor again.  Angel Wings: Lying on your back spread your legs to the side as far apart as you can without causing discomfort.  Hamstring Strength:  Lying on your back, push your heel against the floor with your leg straight by tightening up the muscles of your buttocks.  Repeat, but this time bend your knee to a comfortable angle, and push your heel against the floor.  You may put a pillow under the heel to make it more comfortable if necessary.   A rehabilitation program following joint replacement surgery can speed recovery and prevent re-injury in the future due to weakened muscles. Contact your doctor or a physical therapist for more information on knee rehabilitation.    CONSTIPATION  Constipation is defined medically as fewer than three stools per week and severe constipation as less than one stool per week.  Even if you have a regular bowel pattern at home, your normal regimen is likely to be disrupted due to multiple reasons following surgery.  Combination of anesthesia, postoperative narcotics, change in appetite and fluid intake all can affect your bowels.   YOU MUST use at least one of the following options; they are listed in order of increasing strength to get the job done.  They are all available over the counter, and you may need to use some, POSSIBLY even  all of these options:    Drink plenty of fluids (prune juice may be helpful) and high fiber foods Colace 100 mg by mouth twice a day  Senokot for constipation as directed and as needed Dulcolax (bisacodyl), take with full glass of water  Miralax (polyethylene glycol) once or twice a day as needed.  If you have tried all these things and are unable to have a bowel movement in the first 3-4 days after surgery call either your surgeon or your primary doctor.    If you experience loose stools or diarrhea, hold the medications until you stool forms back up.  If your symptoms do not get better within 1 week or if they get worse, check with your doctor.  If you experience "the worst abdominal pain ever" or develop nausea or vomiting, please contact the office immediately for further recommendations for treatment.   ITCHING:  If you experience itching with your medications, try taking only a single pain pill, or even half a pain pill at a time.  You can also use Benadryl over the counter for itching or also to help with sleep.   TED HOSE STOCKINGS:  Use stockings on both legs  until for at least 2 weeks or as directed by physician office. They may be removed at night for sleeping.  MEDICATIONS:  See your medication summary on the "After Visit Summary" that nursing will review with you.  You may have some home medications which will be placed on hold until you complete the course of blood thinner medication.  It is important for you to complete the blood thinner medication as prescribed.  PRECAUTIONS:  If you experience chest pain or shortness of breath - call 911 immediately for transfer to the hospital emergency department.   If you develop a fever greater that 101 F, purulent drainage from wound, increased redness or drainage from wound, foul odor from the wound/dressing, or calf pain - CONTACT YOUR SURGEON.                                                   FOLLOW-UP APPOINTMENTS:  If you do not  already have a post-op appointment, please call the office for an appointment to be seen by your surgeon.  Guidelines for how soon to be seen are listed in your "After Visit Summary", but are typically between 1-4 weeks after surgery.  OTHER INSTRUCTIONS:   Knee Replacement:  Do not place pillow under knee, focus on keeping the knee straight while resting. CPM instructions: 0-90 degrees, 2 hours in the morning, 2 hours in the afternoon, and 2 hours in the evening. Place foam block, curve side up under heel at all times except when in CPM or when walking.  DO NOT modify, tear, cut, or change the foam block in any way.   MAKE SURE YOU:  Understand these instructions.  Get help right away if you are not doing well or get worse.    Thank you for letting us be a part of your medical care team.  It is a privilege we respect greatly.  We hope these instructions will help you stay on track for a fast and full recovery!   Diagnostic Studies: [place] EKG SITE RITE  Result Date: 10/02/2023 If Site Rite image not attached, placement could not be confirmed due to current cardiac rhythm.  DG Knee Right Port  Result Date: 10/01/2023 CLINICAL DATA:  Infection of total right knee replacement, subsequent encounter. EXAM: PORTABLE RIGHT KNEE - 1-2 VIEW COMPARISON:  Knee radiograph 04/23/2023 FINDINGS: Excision of prior knee arthroplasty with presumed antibiotic spacer. No periprosthetic fracture. Recent postsurgical change includes air and edema in the soft tissues. Drain in place. Anterior skin staples. IMPRESSION: Excision of prior knee arthroplasty with presumed antibiotic spacer. Electronically Signed   By: Narda Rutherford M.D.   On: 10/01/2023 16:35    Disposition: Discharge disposition: 01-Home or Self Care       Discharge Instructions     [facility] pharmacist to adjust dose for Vancomycin, Aminoglycosides and other anti-infective therapies as requested by physician.   Complete by: As  directed    [facility] to provide Cath Flo 2mg    Complete by: As directed    Administer for PICC line occlusion and as ordered by physician for other access device issues.   Anaphylaxis Kit: Provided to treat any anaphylactic reaction to the medication being provided to the patient if First Dose or when requested by physician   Complete by: As directed    Epinephrine 1mg /ml  vial / amp: Administer 0.3mg  (0.26ml) subcutaneously once for moderate to severe anaphylaxis, nurse to call physician and pharmacy when reaction occurs and call 911 if needed for immediate care   Diphenhydramine 50mg /ml IV vial: Administer 25-50mg  IV/IM PRN for first dose reaction, rash, itching, mild reaction, nurse to call physician and pharmacy when reaction occurs   Sodium Chloride 0.9% NS IV: Administer if needed for hypovolemic blood pressure drop or as ordered by physician after call to physician with anaphylactic reaction   Change dressing on IV access line weekly and PRN   Complete by: As directed    Face-to-face encounter (required for Medicare/Medicaid patients)   Complete by: As directed    I Clois Dupes certify that this patient is under my care and that I, or a nurse practitioner or physician's assistant working with me, had a face-to-face encounter that meets the physician face-to-face encounter requirements with this patient on 10/03/2023. The encounter with the patient was in whole, or in part for the following medical condition(s) which is the primary reason for home health care (List medical condition):   Patient has PICC line and need for long term IV antibiotics due to right knee periprosthetic joint infection. Status post removal of total knee arthroplasty and placement of antibiotic articulating spacer. 50% weightbearing on right lower extremity.   The encounter with the patient was in whole, or in part, for the following medical condition, which is the primary reason for home health care:  IV antibiotics and PICC line care.   I certify that, based on my findings, the following services are medically necessary home health services: Nursing   Reason for Medically Necessary Home Health Services:  Skilled Nursing- Assessment and Training for Infusion Therapy, Line Care, and Infection Control Skilled Nursing- Administration and Training of Injectable Medication     My clinical findings support the need for the above services:  Pain interferes with ambulation/mobility OTHER SEE COMMENTS Unable to leave home safely without assistance and/or assistive device     Further, I certify that my clinical findings support that this patient is homebound due to:  Ambulates short distances less than 300 feet Pain interferes with ambulation/mobility     Flush IV access with Sodium Chloride 0.9% and Heparin 10 units/ml or 100 units/ml   Complete by: As directed    Home Health   Complete by: As directed    To provide the following care/treatments: RN   Home infusion instructions - [facility]   Complete by: As directed    Instructions: Flush IV access with Sodium Chloride 0.9% and Heparin 10units/ml or 100units/ml   Change dressing on IV access line: Weekly and PRN   Instructions Cath Flo 2mg : Administer for PICC Line occlusion and as ordered by physician for other access device   [facility] pharmacist to adjust dose for: Vancomycin, Aminoglycosides and other anti-infective therapies as requested by physician   Method of administration may be changed at the discretion of home infusion pharmacist based upon assessment of the patient and/or caregiver's ability to self-administer the medication ordered   Complete by: As directed         Follow-up Information     Swinteck, Arlys John, MD. Schedule an appointment as soon as possible for a visit in 7 day(s).   Specialty: Orthopedic Surgery Why: Follow-up in office within 7 days of discharge for removal of negative pressure  Prevena incisional dressing and for wound re-check Contact information: [address]  [place] [address] [phone]         [facility], [facility] Follow up.   Specialty: Home Health Services Why: Frances Furbish will be providing home health RN. Contact information: [address] [place] [address] [phone]         [facility] Follow up.   Why: Pam with [facility], [phone] will be your contact person for home IV antibiotics.                 SignedClois Dupes 10/09/2023, 10:04 AM

## 2023-10-09 NOTE — Telephone Encounter (Signed)
RN with [facility] - called stating that patient wife informed her patient is having a lot of diarrhea and wants to stop IV abx.   Informed RN that Dr.Singh spoke with patient yesterday and told him to start a probiotic. Patient scheduled for appointment on 10/18 to see if he needs to be tested for c-diff and any questions regarding d/c IV abx can be discussed at appointment.    Tyler Gates Lesli Albee, CMA

## 2023-10-09 NOTE — Telephone Encounter (Signed)
Patient declined to go to ED. Will wait for appointment tomorrow with Dr.Manandhar.   Tyler Gates Lesli Albee, CMA

## 2023-10-10 ENCOUNTER — Encounter: Payer: Self-pay | Admitting: Gastroenterology

## 2023-10-10 ENCOUNTER — Other Ambulatory Visit: Payer: Self-pay

## 2023-10-10 ENCOUNTER — Encounter: Payer: Self-pay | Admitting: Infectious Diseases

## 2023-10-10 ENCOUNTER — Ambulatory Visit: Payer: Medicare HMO | Admitting: Infectious Diseases

## 2023-10-10 VITALS — BP 96/65 | HR 103 | Temp 97.6°F | Wt 187.0 lb

## 2023-10-10 DIAGNOSIS — R197 Diarrhea, unspecified: Secondary | ICD-10-CM

## 2023-10-10 DIAGNOSIS — Z79899 Other long term (current) drug therapy: Secondary | ICD-10-CM | POA: Insufficient documentation

## 2023-10-10 DIAGNOSIS — T8450XA Infection and inflammatory reaction due to unspecified internal joint prosthesis, initial encounter: Secondary | ICD-10-CM | POA: Insufficient documentation

## 2023-10-10 DIAGNOSIS — T8450XD Infection and inflammatory reaction due to unspecified internal joint prosthesis, subsequent encounter: Secondary | ICD-10-CM | POA: Diagnosis not present

## 2023-10-10 DIAGNOSIS — E861 Hypovolemia: Secondary | ICD-10-CM | POA: Insufficient documentation

## 2023-10-10 NOTE — Progress Notes (Signed)
Patient Active Problem List   Diagnosis Date Noted   Infection of total right knee replacement, subsequent encounter 10/01/2023   Infection of total right knee replacement (HCC) 08/27/2023   Rash 08/01/2023   Body aches 08/01/2023   Osteoarthritis of right knee 04/23/2023   Preoperative clearance 02/11/2023   Chronic sinusitis 03/12/2022   Visual disturbance 03/12/2022   Acute sinusitis 01/29/2022   Chronic kidney disease (CKD) stage G2/A2, mildly decreased glomerular filtration rate (GFR) between 60-89 mL/min/1.73 square meter and albuminuria creatinine ratio between 30-299 mg/g 10/22/2018   Hypogonadism male 10/21/2018   Screening PSA (prostate specific antigen) 10/21/2018   Chronic kidney disease due to benign hypertension 10/21/2018   Depression, major, recurrent, in remission (HCC) 10/21/2018   Statin intolerance 09/14/2018   Agatston coronary artery calcium score greater than 400 08/10/2018   Family history of coronary artery disease 08/10/2018   Crohn's disease of colon without complication (HCC) 05/09/2017   Mucosal abnormality of esophagus    Hiatal hernia    KNEE JOINT INSTABILITY 03/07/2007   Hyperlipidemia 12/30/2006   Essential hypertension 12/30/2006   GERD 12/30/2006    Patient's Medications  New Prescriptions   No medications on file  Previous Medications   AMLODIPINE (NORVASC) 5 MG TABLET    TAKE 2 TABLETS BY MOUTH EVERY DAY   ASPIRIN (ASPIRIN CHILDRENS) 81 MG CHEWABLE TABLET    Chew 1 tablet (81 mg total) by mouth 2 (two) times daily with a meal.   CEFAZOLIN (ANCEF) IVPB    Inject 2 g into the vein every 8 (eight) hours. Indication:  MSSE prosthetic knee infection  First Dose: Yes Last Day of Therapy:  11/12/2023 Labs - Once weekly:  CBC/D and BMP, Labs - Once weekly: ESR and CRP Method of administration: IV Push Method of administration may be changed at the discretion of home infusion pharmacist based upon assessment of the patient and/or  caregiver's ability to self-administer the medication ordered.   DOCUSATE SODIUM (COLACE) 100 MG CAPSULE    Take 1 capsule (100 mg total) by mouth 2 (two) times daily.   EZETIMIBE (ZETIA) 10 MG TABLET    TAKE 1 TABLET BY MOUTH EVERY DAY   LOPERAMIDE (IMODIUM) 2 MG CAPSULE    Take 2 mg by mouth as needed for diarrhea or loose stools.   MELOXICAM (MOBIC) 15 MG TABLET    Take 15 mg by mouth daily.   METHOCARBAMOL (ROBAXIN) 500 MG TABLET    Take 1 tablet (500 mg total) by mouth every 6 (six) hours as needed.   OLMESARTAN (BENICAR) 20 MG TABLET    TAKE 1 TABLET BY MOUTH EVERY DAY   OMEPRAZOLE (PRILOSEC) 40 MG CAPSULE    TAKE 1 CAPSULE (40 MG TOTAL) BY MOUTH IN THE MORNING AND AT BEDTIME.   ONDANSETRON (ZOFRAN) 4 MG TABLET    Take 1 tablet (4 mg total) by mouth every 8 (eight) hours as needed for nausea or vomiting.   OXYCODONE (ROXICODONE) 5 MG IMMEDIATE RELEASE TABLET    Take 1-2 tablets (5-10 mg total) by mouth every 4 (four) hours as needed for severe pain.   POLYETHYLENE GLYCOL (MIRALAX) 17 G PACKET    Take 17 g by mouth daily as needed for mild constipation or moderate constipation.   ROSUVASTATIN (CRESTOR) 20 MG TABLET    TAKE 1 TABLET BY MOUTH EVERY DAY   SENNA (SENOKOT) 8.6 MG TABS TABLET    Take 2 tablets (17.2 mg total) by mouth at  bedtime for 15 days.   VENLAFAXINE (EFFEXOR) 37.5 MG TABLET    TAKE 1 TABLET BY MOUTH TWICE A DAY  Modified Medications   No medications on file  Discontinued Medications   No medications on file    Subjective: 67 Y O male with PMH of HLD, HTN, kidney stones, GERD, Depression, crohn's disease ( remicade on hold since 7/25) who was recently admitted for PJI of rt knee and discharged on IV cefazolin through PICC on 10/22 who is here for worsening diarrhea since Monday.   He has a history of primary right TKA on 04/23/2023 complicated by worsening pain after yellowjacket bite in September 2024.  Underwent right knee aspiration on 8/11 with cultures growing MSSE.  He  has been on amoxicillin since 10/7 due to sores around right knee. He underwent RT Total knee resection arthroplasty with placement of antibiotic impregnated spacer 10/9. OR cx NG.   10/18 Patient is accompanied by his wife. Diarrhea is described as mostly liquid with some solid particles, occurring up to four times in an hour, 7 times this morning. The frequency and consistency have worsened over the past few days. He denies fever but reports chills a couple of days ago and mild abdominal discomfort but no cramps or pain. He is eating and drinking adequately but reports feeling lightheaded, particularly this morning as he has not eaten anything in the morning. He reports morning was very hectic as he had 2 doctor appointments. He just came after being seen by Orthopedics. Still on IV cefazolin from rt arm picc with no PICC concerns. He has not taken his usual Remicade for Crohn's disease since July 25th due to the active infection Denies any other concerns   Review of Systems: denies nausea, vomiting and abdominal pain   Past Medical History:  Diagnosis Date   Arthritis    Crohn's disease (HCC)    Depression    Dyspnea on exertion    GERD (gastroesophageal reflux disease)    Herpes    History of kidney stones    Hyperlipidemia    Hypertension    Instability of knee joint    Left displaced femoral neck fracture (HCC) 07/11/2017   OSA (obstructive sleep apnea)    mild   Snoring    Past Surgical History:  Procedure Laterality Date   COLONOSCOPY  2007   Dr. Ozzie Hoyle normal TI, ascending colon/cecum with mild inflammation noted but biopses without signs of active inflammation, hyperpastic polyp, possible ischemia on path of left colon biopsy   COLONOSCOPY N/A 11/08/2015   Procedure: COLONOSCOPY;  Surgeon: Corbin Ade, MD;  Location: [facility];  Service: Endoscopy;  Laterality: N/A;   ESOPHAGOGASTRODUODENOSCOPY  2007   Dr. Ozzie Hoyle reflux esophagitis, hiatal hernia, negative H.pylori    ESOPHAGOGASTRODUODENOSCOPY N/A 11/08/2015   Procedure: ESOPHAGOGASTRODUODENOSCOPY (EGD);  Surgeon: Corbin Ade, MD;  Location: [facility];  Service: Endoscopy;  Laterality: N/A;  0830 - moved to 8:15 - office to notify   EXCISIONAL TOTAL KNEE ARTHROPLASTY WITH ANTIBIOTIC SPACERS Right 10/01/2023   Procedure: EXCISIONAL TOTAL KNEE ARTHROPLASTY WITH PLACEMENT OF ARTICULATING ANTIBIOTIC SPACER;  Surgeon: Samson Frederic, MD;  Location: [facility];  Service: Orthopedics;  Laterality: Right;   HIP PINNING,CANNULATED Left 07/12/2017   Procedure: CANNULATED HIP PINNING LEFT;  Surgeon: Samson Frederic, MD;  Location: [facility];  Service: Orthopedics;  Laterality: Left;   KNEE ARTHROPLASTY Right 04/23/2023   Procedure: COMPUTER ASSISTED TOTAL KNEE ARTHROPLASTY;  Surgeon: Samson Frederic, MD;  Location: [facility];  Service: Orthopedics;  Laterality: Right;  160    Social History   Tobacco Use   Smoking status: Never   Smokeless tobacco: Never  Vaping Use   Vaping status: Never Used  Substance Use Topics   Alcohol use: Not Currently   Drug use: No    Family History  Problem Relation Age of Onset   Melanoma Father    Heart disease Father    Healthy Mother    Colon cancer Neg Hx    Esophageal cancer Neg Hx     Allergies  Allergen Reactions   Sulfasalazine Other (See Comments)    Flu like feelings   Statins Other (See Comments)    REACTION: Myalgia    Health Maintenance  Topic Date Due   DTaP/Tdap/Td (1 - Tdap) Never done   COVID-19 Vaccine (4 - 2023-24 season) 11/12/2023 (Originally 08/24/2023)   INFLUENZA VACCINE  03/22/2024 (Originally 07/24/2023)   Zoster Vaccines- Shingrix (1 of 2) 05/11/2024 (Originally 11/17/1974)   Pneumonia Vaccine 72+ Years old (1 of 1 - PCV) 07/31/2024 (Originally 11/17/2020)   Medicare Annual Wellness (AWV)  08/11/2024   Colonoscopy  05/24/2030   Hepatitis C Screening  Completed   HPV VACCINES  Aged Out    Objective: BP 96/65   Pulse (!) 103   Temp 97.6 F  (36.4 C) (Oral)   Wt 187 lb (84.8 kg)   SpO2 96%   BMI 27.62 kg/m    Physical Exam Constitutional:      Appearance: Normal appearance.  HENT:     Head: Normocephalic and atraumatic.      Mouth: Mucous membranes are moist.  Eyes:    Conjunctiva/sclera: Conjunctivae normal.     Pupils: Pupils are equal, round, and bilaterally symmetrical  Cardiovascular:     Rate and Rhythm: Normal rate and regular rhythm.     Heart sounds: s1s2  Pulmonary:     Effort: Pulmonary effort is normal.     Breath sounds: Normal breath sounds.   Abdominal:     General: Non distended     Palpations: soft.   Musculoskeletal:        General: Normal range of motion.  Rt knee staples with no erythema, swelling or warmth or tenderness   Skin:    General: Skin is warm and dry.     Comments: PICC with no concerns   Neurological:     General: grossly non focal     Mental Status: awake, alert and oriented to person, place, and time.   Psychiatric:        Mood and Affect: Mood normal.   Lab Results Lab Results  Component Value Date   WBC 8.3 10/04/2023   HGB 8.1 (L) 10/04/2023   HCT 25.3 (L) 10/04/2023   MCV 92.7 10/04/2023   PLT 199 10/04/2023    Lab Results  Component Value Date   CREATININE 1.08 10/04/2023   BUN 19 10/04/2023   NA 137 10/04/2023   K 3.9 10/04/2023   CL 101 10/04/2023   CO2 28 10/04/2023    Lab Results  Component Value Date   ALT 54 (H) 09/10/2023   AST 29 09/10/2023   ALKPHOS 84 08/01/2023   BILITOT 0.4 09/10/2023    Lab Results  Component Value Date   CHOL 165 11/06/2022   HDL 40 11/06/2022   LDLCALC 85 11/06/2022   TRIG 239 (H) 11/06/2022   CHOLHDL 4.1 11/06/2022   No results found for: "LABRPR", "RPRTITER" No results found  for: "HIV1RNAQUANT", "HIV1RNAVL", "CD4TABS"   Assessment/plan # Diarrhea, acute onset  - likely abtx associated, discussed to take probiotics  - Check c diff, GI panel, CBC and BMP - Fu in a week for labs review   #  Dizziness, low BP - discussed to go to ED,  however seems reluctant to go as he thinks its all related to not having eaten anything in the morning coupled with diarrhea.  Advised to eat and drink properly and recheck his BP at home. He has a BP cuff. If still low, feels dizzy, he needs to go to ED for possible dehydration in the setting of diarrhea.   # RT knee PJI  - continue cefazolin as is pending above studies   # Crohn's disease  - Remicade on hold   I have personally spent 47 minutes involved in face-to-face and non-face-to-face activities for this patient on the day of the visit. Professional time spent includes the following activities: Preparing to see the patient (review of tests), Obtaining and/or reviewing separately obtained history (admission/discharge record), Performing a medically appropriate examination and/or evaluation , Ordering medications/tests/procedures, referring and communicating with other health care professionals, Documenting clinical information in the EMR, Independently interpreting results (not separately reported), Communicating results to the patient/family/caregiver, Counseling and educating the patient/family/caregiver and Care coordination (not separately reported).   Victoriano Lain, MD [facility] 10/10/2023, 11:00 AM

## 2023-10-10 NOTE — Progress Notes (Signed)
Labs drawn via PICC line per Dr. Elinor Parkinson. Line flushed with 10 mL normal saline, heparin locked, and clamped. Patient tolerated procedure well.   Sandie Ano, RN

## 2023-10-10 NOTE — Progress Notes (Signed)
Provided patient with Red Collection container and - [place] for stool test. Provided patient printed instructions on how to collect specimen. Understands he needs to bring sample no later than 24 hours after collecting sample. Answered all questions. Understands to call office with any additional questions before collecting test. Juanita Laster, RMA

## 2023-10-11 LAB — CBC
HCT: 30 % — ABNORMAL LOW (ref 38.5–50.0)
Hemoglobin: 9.8 g/dL — ABNORMAL LOW (ref 13.2–17.1)
MCH: 29.4 pg (ref 27.0–33.0)
MCHC: 32.7 g/dL (ref 32.0–36.0)
MCV: 90.1 fL (ref 80.0–100.0)
MPV: 10.6 fL (ref 7.5–12.5)
Platelets: 477 10*3/uL — ABNORMAL HIGH (ref 140–400)
RBC: 3.33 10*6/uL — ABNORMAL LOW (ref 4.20–5.80)
RDW: 14.5 % (ref 11.0–15.0)
WBC: 9 10*3/uL (ref 3.8–10.8)

## 2023-10-11 LAB — BASIC METABOLIC PANEL
BUN/Creatinine Ratio: 10 (calc) (ref 6–22)
BUN: 15 mg/dL (ref 7–25)
CO2: 24 mmol/L (ref 20–32)
Calcium: 9.3 mg/dL (ref 8.6–10.3)
Chloride: 105 mmol/L (ref 98–110)
Creat: 1.43 mg/dL — ABNORMAL HIGH (ref 0.70–1.35)
Glucose, Bld: 147 mg/dL — ABNORMAL HIGH (ref 65–99)
Potassium: 4 mmol/L (ref 3.5–5.3)
Sodium: 140 mmol/L (ref 135–146)

## 2023-10-12 DIAGNOSIS — T8453XD Infection and inflammatory reaction due to internal right knee prosthesis, subsequent encounter: Secondary | ICD-10-CM | POA: Diagnosis not present

## 2023-10-13 ENCOUNTER — Other Ambulatory Visit: Payer: Self-pay | Admitting: *Deleted

## 2023-10-13 ENCOUNTER — Other Ambulatory Visit: Payer: Medicare HMO

## 2023-10-13 ENCOUNTER — Telehealth: Payer: Self-pay

## 2023-10-13 ENCOUNTER — Telehealth: Payer: Self-pay | Admitting: *Deleted

## 2023-10-13 ENCOUNTER — Other Ambulatory Visit: Payer: Self-pay

## 2023-10-13 DIAGNOSIS — T8453XA Infection and inflammatory reaction due to internal right knee prosthesis, initial encounter: Secondary | ICD-10-CM | POA: Diagnosis not present

## 2023-10-13 DIAGNOSIS — E785 Hyperlipidemia, unspecified: Secondary | ICD-10-CM | POA: Diagnosis not present

## 2023-10-13 DIAGNOSIS — R197 Diarrhea, unspecified: Secondary | ICD-10-CM

## 2023-10-13 DIAGNOSIS — B957 Other staphylococcus as the cause of diseases classified elsewhere: Secondary | ICD-10-CM | POA: Diagnosis not present

## 2023-10-13 DIAGNOSIS — I10 Essential (primary) hypertension: Secondary | ICD-10-CM | POA: Diagnosis not present

## 2023-10-13 DIAGNOSIS — K50119 Crohn's disease of large intestine with unspecified complications: Secondary | ICD-10-CM

## 2023-10-13 DIAGNOSIS — G4733 Obstructive sleep apnea (adult) (pediatric): Secondary | ICD-10-CM | POA: Diagnosis not present

## 2023-10-13 DIAGNOSIS — K449 Diaphragmatic hernia without obstruction or gangrene: Secondary | ICD-10-CM | POA: Diagnosis not present

## 2023-10-13 DIAGNOSIS — F32A Depression, unspecified: Secondary | ICD-10-CM | POA: Diagnosis not present

## 2023-10-13 DIAGNOSIS — K2100 Gastro-esophageal reflux disease with esophagitis, without bleeding: Secondary | ICD-10-CM | POA: Diagnosis not present

## 2023-10-13 DIAGNOSIS — K5090 Crohn's disease, unspecified, without complications: Secondary | ICD-10-CM | POA: Diagnosis not present

## 2023-10-13 NOTE — Progress Notes (Signed)
Care Coordination   Note   10/13/2023 Name: AMIRALI CANFIELD MRN: [id number] DOB: [birth year]  BETHEL DEERY is a 67 y.o. year old male who sees Everrett Coombe, DO for primary care. I reached out to Hedwig Morton by phone today to offer care coordination services.  Mr. Carrick was given information about Care Coordination services today including:   The Care Coordination services include support from the care team which includes your Nurse Coordinator, Clinical Social Worker, or Pharmacist.  The Care Coordination team is here to help remove barriers to the health concerns and goals most important to you. Care Coordination services are voluntary, and the patient may decline or stop services at any time by request to their care team member.   Care Coordination Consent Status: Patient agreed to services and verbal consent obtained.   Follow up plan:  Telephone appointment with care coordination team member scheduled for:  10/17/2023  Encounter Outcome:  Patient Scheduled from referral   Burman Nieves, [facility] Care Coordination Care Guide Direct Dial: [phone]

## 2023-10-13 NOTE — Telephone Encounter (Signed)
Spoke with  patient, he was concerned about his hb. Hb was 14.4 almost 3 weeks ago. No reported bleeding. Discussed to fu with PCP for anemia.   Reports his diarrhea has continued to improve since last seen and no diarrhea at all today. Recommend to continue abtx as is and no need to check c diff.   He was asking if could cancel his appointment on 10/24 which is ok as diarrhea has already improved. Could you please cancel his appointment on 10/24?

## 2023-10-13 NOTE — Progress Notes (Signed)
Fecal

## 2023-10-13 NOTE — Telephone Encounter (Signed)
-----   Message from Tyler Gates sent at 10/13/2023  6:09 AM EDT ----- Please let patient kow lab remarkable for AKI with Cr elevated to 1.43 likely 2/2 dehydration 2/2 diarrhea. Recommend to hydrate adequately and plan to check BMP in next fu visit in few days.

## 2023-10-13 NOTE — Telephone Encounter (Signed)
Spoke with patient regarding lab results. Understands he will need to make sure he is hydrating. Verbalized understanding.  Is requesting MD call him before his appt on 10/24 to answer additional questions he has regarding labs. Juanita Laster, RMA

## 2023-10-14 ENCOUNTER — Encounter: Payer: Self-pay | Admitting: Infectious Diseases

## 2023-10-15 ENCOUNTER — Telehealth: Payer: Self-pay

## 2023-10-15 NOTE — Telephone Encounter (Signed)
-----   Message from Victoriano Lain sent at 10/15/2023  7:55 AM EDT ----- Please let him know C diff test was negative, no new recommendations at this time.

## 2023-10-15 NOTE — Telephone Encounter (Signed)
Patient aware of results.   Felicita Nuncio P Chavonne Sforza, CMA  

## 2023-10-16 ENCOUNTER — Encounter: Payer: Self-pay | Admitting: Internal Medicine

## 2023-10-16 ENCOUNTER — Ambulatory Visit: Payer: Medicare HMO | Admitting: Infectious Diseases

## 2023-10-16 LAB — C. DIFFICILE GDH AND TOXIN A/B
GDH ANTIGEN: NOT DETECTED
MICRO NUMBER:: 15625919
SPECIMEN QUALITY:: ADEQUATE
TOXIN A AND B: NOT DETECTED

## 2023-10-16 LAB — GASTROINTESTINAL PATHOGEN PNL
CampyloBacter Group: NOT DETECTED
Norovirus GI/GII: NOT DETECTED
Rotavirus A: NOT DETECTED
Salmonella species: NOT DETECTED
Shiga Toxin 1: NOT DETECTED
Shiga Toxin 2: NOT DETECTED
Shigella Species: NOT DETECTED
Vibrio Group: NOT DETECTED
Yersinia enterocolitica: NOT DETECTED

## 2023-10-16 NOTE — Plan of Care (Signed)
[facility] Tonsillectomy/Adenoidectomy, Postoperative PEDS care plan entered in error.

## 2023-10-17 ENCOUNTER — Encounter: Payer: Medicare HMO | Admitting: Licensed Clinical Social Worker

## 2023-10-19 DIAGNOSIS — T8453XD Infection and inflammatory reaction due to internal right knee prosthesis, subsequent encounter: Secondary | ICD-10-CM | POA: Diagnosis not present

## 2023-10-20 ENCOUNTER — Ambulatory Visit: Payer: Self-pay | Admitting: *Deleted

## 2023-10-20 DIAGNOSIS — F32A Depression, unspecified: Secondary | ICD-10-CM | POA: Diagnosis not present

## 2023-10-20 DIAGNOSIS — B957 Other staphylococcus as the cause of diseases classified elsewhere: Secondary | ICD-10-CM | POA: Diagnosis not present

## 2023-10-20 DIAGNOSIS — T8453XA Infection and inflammatory reaction due to internal right knee prosthesis, initial encounter: Secondary | ICD-10-CM | POA: Diagnosis not present

## 2023-10-20 DIAGNOSIS — K2100 Gastro-esophageal reflux disease with esophagitis, without bleeding: Secondary | ICD-10-CM | POA: Diagnosis not present

## 2023-10-20 DIAGNOSIS — E785 Hyperlipidemia, unspecified: Secondary | ICD-10-CM | POA: Diagnosis not present

## 2023-10-20 DIAGNOSIS — K449 Diaphragmatic hernia without obstruction or gangrene: Secondary | ICD-10-CM | POA: Diagnosis not present

## 2023-10-20 DIAGNOSIS — K5090 Crohn's disease, unspecified, without complications: Secondary | ICD-10-CM | POA: Diagnosis not present

## 2023-10-20 DIAGNOSIS — G4733 Obstructive sleep apnea (adult) (pediatric): Secondary | ICD-10-CM | POA: Diagnosis not present

## 2023-10-20 DIAGNOSIS — I10 Essential (primary) hypertension: Secondary | ICD-10-CM | POA: Diagnosis not present

## 2023-10-21 DIAGNOSIS — T8459XD Infection and inflammatory reaction due to other internal joint prosthesis, subsequent encounter: Secondary | ICD-10-CM | POA: Diagnosis not present

## 2023-10-21 NOTE — Patient Outreach (Signed)
Care Coordination   Initial Visit Note   10/21/2023 Name: Tyler Gates MRN: [id number] DOB: 01/27/1956  Tyler Gates is a 67 y.o. year old male who sees Tyler Coombe, DO for primary care. I spoke with  Tyler Gates by phone on 10/20/23  What matters to the patients health and wellness today?  Patient referred  as follow up from a EMMI call. Patient denied having any mental health needs at this time. Reports having a positive support system as he  continues to adjust daily  to his current medical condition.    Goals Addressed             This Visit's Progress    care coordination activities       Interventions Today    Flowsheet Row Most Recent Value  Chronic Disease   Chronic disease during today's visit Other  [recent knee replacement]  General Interventions   General Interventions Discussed/Reviewed --  [pt shared that he will require a 3rd knee surgery, currenlty has a  picc line working with Tyler Gates [facility] nurse to change -currenlty on IV antibiotic 3x per day for  6 weeks]  Doctor Visits Discussed/Reviewed Doctor Visits Reviewed  Tyler Gates 10/29 to remove stitches from surgery]  Mental Health Interventions   Mental Health Discussed/Reviewed Mental Health Discussed, Coping Strategies, Depression  [Assessed for mental health needs-active prior to surgery- now working towards  building his energy back up daily-continues to adjust to his medical condition, confirms having postive supports, denies need for mental health follow up at this time.]                 SDOH assessments and interventions completed:  Yes  SDOH Interventions Today    Flowsheet Row Most Recent Value  SDOH Interventions   Food Insecurity Interventions Intervention Not Indicated  Housing Interventions Intervention Not Indicated  Transportation Interventions Intervention Not Indicated  Stress Interventions Intervention Not Indicated        Care Coordination Interventions:  Yes, provided    Follow up plan: No further intervention required.   Encounter Outcome:  Patient Visit Completed

## 2023-10-21 NOTE — Patient Instructions (Signed)
Visit Information  Thank you for taking time to visit with me today. Please don't hesitate to contact me if I can be of assistance to you.   Following are the goals we discussed today:  Please contact your provider with any questions regarding your medical care Please contact this CSW [phone] with any additional mental health or community resource needs that may arise in the future    If you are experiencing a Mental Health or Behavioral Health Crisis or need someone to talk to, please call the Suicide and Crisis Lifeline: 988   Patient verbalizes understanding of instructions and care plan provided today and agrees to view in MyChart. Active MyChart status and patient understanding of how to access instructions and care plan via MyChart confirmed with patient.     No further follow up required: patient to contact this Child psychotherapist with any additional community resource needs  [employer] [facility], [facility] Licensed Clinical Social Geologist, engineering Dial: [phone]

## 2023-10-22 ENCOUNTER — Other Ambulatory Visit: Payer: Self-pay | Admitting: Internal Medicine

## 2023-10-22 DIAGNOSIS — D485 Neoplasm of uncertain behavior of skin: Secondary | ICD-10-CM | POA: Diagnosis not present

## 2023-10-22 DIAGNOSIS — E785 Hyperlipidemia, unspecified: Secondary | ICD-10-CM

## 2023-10-22 DIAGNOSIS — L0100 Impetigo, unspecified: Secondary | ICD-10-CM | POA: Diagnosis not present

## 2023-10-23 ENCOUNTER — Telehealth: Payer: Self-pay | Admitting: Pharmacy Technician

## 2023-10-23 NOTE — Telephone Encounter (Signed)
Auth Submission: APPROVED Site of care: Site of care: [facility] Payer: [employer] Medication & CPT/J Code(s) submitted: Remicade (Infliximab) J1745 Route of submission (phone, fax, portal):  Phone # Fax # Auth type: Buy/Bill PB Units/visits requested: 5mg /kg 500mg  [id number] Reference number: [id number] Approval from: 12/24/23 to 12/22/24

## 2023-10-25 DIAGNOSIS — T8453XD Infection and inflammatory reaction due to internal right knee prosthesis, subsequent encounter: Secondary | ICD-10-CM | POA: Diagnosis not present

## 2023-10-26 ENCOUNTER — Other Ambulatory Visit: Payer: Self-pay | Admitting: Cardiology

## 2023-10-26 DIAGNOSIS — E785 Hyperlipidemia, unspecified: Secondary | ICD-10-CM

## 2023-10-27 DIAGNOSIS — B957 Other staphylococcus as the cause of diseases classified elsewhere: Secondary | ICD-10-CM | POA: Diagnosis not present

## 2023-10-27 DIAGNOSIS — E785 Hyperlipidemia, unspecified: Secondary | ICD-10-CM | POA: Diagnosis not present

## 2023-10-27 DIAGNOSIS — G4733 Obstructive sleep apnea (adult) (pediatric): Secondary | ICD-10-CM | POA: Diagnosis not present

## 2023-10-27 DIAGNOSIS — K449 Diaphragmatic hernia without obstruction or gangrene: Secondary | ICD-10-CM | POA: Diagnosis not present

## 2023-10-27 DIAGNOSIS — K5090 Crohn's disease, unspecified, without complications: Secondary | ICD-10-CM | POA: Diagnosis not present

## 2023-10-27 DIAGNOSIS — K2100 Gastro-esophageal reflux disease with esophagitis, without bleeding: Secondary | ICD-10-CM | POA: Diagnosis not present

## 2023-10-27 DIAGNOSIS — T8453XA Infection and inflammatory reaction due to internal right knee prosthesis, initial encounter: Secondary | ICD-10-CM | POA: Diagnosis not present

## 2023-10-27 DIAGNOSIS — I10 Essential (primary) hypertension: Secondary | ICD-10-CM | POA: Diagnosis not present

## 2023-10-27 DIAGNOSIS — F32A Depression, unspecified: Secondary | ICD-10-CM | POA: Diagnosis not present

## 2023-10-31 DIAGNOSIS — T8453XD Infection and inflammatory reaction due to internal right knee prosthesis, subsequent encounter: Secondary | ICD-10-CM | POA: Diagnosis not present

## 2023-11-03 ENCOUNTER — Other Ambulatory Visit: Payer: Self-pay

## 2023-11-03 ENCOUNTER — Encounter: Payer: Self-pay | Admitting: Internal Medicine

## 2023-11-03 ENCOUNTER — Ambulatory Visit: Payer: Medicare HMO | Admitting: Internal Medicine

## 2023-11-03 VITALS — BP 121/78 | HR 99 | Temp 98.3°F | Ht 69.0 in | Wt 188.0 lb

## 2023-11-03 DIAGNOSIS — K2100 Gastro-esophageal reflux disease with esophagitis, without bleeding: Secondary | ICD-10-CM | POA: Diagnosis not present

## 2023-11-03 DIAGNOSIS — K449 Diaphragmatic hernia without obstruction or gangrene: Secondary | ICD-10-CM | POA: Diagnosis not present

## 2023-11-03 DIAGNOSIS — K5090 Crohn's disease, unspecified, without complications: Secondary | ICD-10-CM | POA: Diagnosis not present

## 2023-11-03 DIAGNOSIS — T8453XD Infection and inflammatory reaction due to internal right knee prosthesis, subsequent encounter: Secondary | ICD-10-CM | POA: Diagnosis not present

## 2023-11-03 DIAGNOSIS — E785 Hyperlipidemia, unspecified: Secondary | ICD-10-CM | POA: Diagnosis not present

## 2023-11-03 DIAGNOSIS — T8453XA Infection and inflammatory reaction due to internal right knee prosthesis, initial encounter: Secondary | ICD-10-CM | POA: Diagnosis not present

## 2023-11-03 DIAGNOSIS — G4733 Obstructive sleep apnea (adult) (pediatric): Secondary | ICD-10-CM | POA: Diagnosis not present

## 2023-11-03 DIAGNOSIS — I10 Essential (primary) hypertension: Secondary | ICD-10-CM | POA: Diagnosis not present

## 2023-11-03 DIAGNOSIS — B957 Other staphylococcus as the cause of diseases classified elsewhere: Secondary | ICD-10-CM | POA: Diagnosis not present

## 2023-11-03 DIAGNOSIS — F32A Depression, unspecified: Secondary | ICD-10-CM | POA: Diagnosis not present

## 2023-11-03 NOTE — Progress Notes (Signed)
Patient: Tyler Gates  DOB: [birth year] MRN: [id number] PCP: Everrett Coombe, DO    Chief Complaint  Patient presents with   Follow-up     Patient Active Problem List   Diagnosis Date Noted   Diarrhea 10/10/2023   Prosthetic joint infection (HCC) 10/10/2023   Medication management 10/10/2023   Hypotension due to hypovolemia 10/10/2023   Infection of total right knee replacement, subsequent encounter 10/01/2023   Infection of total right knee replacement (HCC) 08/27/2023   Rash 08/01/2023   Body aches 08/01/2023   Osteoarthritis of right knee 04/23/2023   Preoperative clearance 02/11/2023   Chronic sinusitis 03/12/2022   Visual disturbance 03/12/2022   Acute sinusitis 01/29/2022   Chronic kidney disease (CKD) stage G2/A2, mildly decreased glomerular filtration rate (GFR) between 60-89 mL/min/1.73 square meter and albuminuria creatinine ratio between 30-299 mg/g 10/22/2018   Hypogonadism male 10/21/2018   Screening PSA (prostate specific antigen) 10/21/2018   Chronic kidney disease due to benign hypertension 10/21/2018   Depression, major, recurrent, in remission (HCC) 10/21/2018   Statin intolerance 09/14/2018   Agatston coronary artery calcium score greater than 400 08/10/2018   Family history of coronary artery disease 08/10/2018   Crohn's disease of colon without complication (HCC) 05/09/2017   Mucosal abnormality of esophagus    Hiatal hernia    KNEE JOINT INSTABILITY 03/07/2007   Hyperlipidemia 12/30/2006   Essential hypertension 12/30/2006   GERD 12/30/2006     Subjective:  Tyler Gates is a 67 y.o. male with past medical history as below including Crohn's disease on Remicade, arthritis, GERD, hyperlipidemia, hypertension, underwent primary right knee TKA on 04/23/2023 with Dr. Linna Caprice presents from PCP office for concern about infected right knee.  He underwent index case for right knee arthritis.Seen by Elmarie Shiley, DO on 9/4 who noted patient was having  some increased knee pain as well as rash and bodyaches after yellowjacket sting.  Noted that orthopedist had withdrew fluid from the knee came back positive Staph epidermidis resistant to tetracycline clindamycin sensitive to oxacillin.  CRP and ESR elevated.  Initially given Levaquin and then placed on Keflex.  He was told that he will likely need surgery to have possible replacement. Pt reprots he devloped a rash around incsion about a mmonth after surgery. The rash was not on incsion and wood ooze clear fluid. Was Rx ceflex about 2 months after srugery. He did not satrt taking it. He went urgent care and was Rx steroid cream which cleared up rash.  He had aspiration of knee on 8/13 with 37,260 wvc, 88% neutorphils, synavasure + staph, Cx + stpah epi S oxacillin. Pt has spacer placement scheduled on Oct 9th 09/10/23: He reports knee is not painful/swollen. He has a small rash on lateral knee, non pruritic. Steroid cream has not resolved it.  Today Presents and has PICC SP TKA resection and spacer placement. He has been on amox about 2 days prior to OR. His diarrhea has resolved.  Review of Systems  All other systems reviewed and are negative.   Past Medical History:  Diagnosis Date   Arthritis    Crohn's disease (HCC)    Depression    Dyspnea on exertion    GERD (gastroesophageal reflux disease)    Herpes    History of kidney stones    Hyperlipidemia    Hypertension    Instability of knee joint    Left displaced femoral neck fracture (HCC) 07/11/2017   OSA (obstructive sleep apnea)  mild   Snoring     Outpatient Medications Prior to Visit  Medication Sig Dispense Refill   amLODipine (NORVASC) 5 MG tablet TAKE 2 TABLETS BY MOUTH EVERY DAY (Patient taking differently: Take 7.5 mg by mouth daily.) 180 tablet 0   aspirin (ASPIRIN CHILDRENS) 81 MG chewable tablet Chew 1 tablet (81 mg total) by mouth 2 (two) times daily with a meal. 90 tablet 0   ceFAZolin (ANCEF) IVPB Inject 2 g into  the vein every 8 (eight) hours. Indication:  MSSE prosthetic knee infection  First Dose: Yes Last Day of Therapy:  11/12/2023 Labs - Once weekly:  CBC/D and BMP, Labs - Once weekly: ESR and CRP Method of administration: IV Push Method of administration may be changed at the discretion of home infusion pharmacist based upon assessment of the patient and/or caregiver's ability to self-administer the medication ordered. 120 Units 0   ezetimibe (ZETIA) 10 MG tablet TAKE 1 TABLET BY MOUTH EVERY DAY 90 tablet 3   meloxicam (MOBIC) 15 MG tablet Take 15 mg by mouth daily.     olmesartan (BENICAR) 20 MG tablet TAKE 1 TABLET BY MOUTH EVERY DAY 90 tablet 0   omeprazole (PRILOSEC) 40 MG capsule TAKE 1 CAPSULE (40 MG TOTAL) BY MOUTH IN THE MORNING AND AT BEDTIME. 180 capsule 3   ondansetron (ZOFRAN) 4 MG tablet Take 1 tablet (4 mg total) by mouth every 8 (eight) hours as needed for nausea or vomiting. 30 tablet 0   rosuvastatin (CRESTOR) 20 MG tablet TAKE 1 TABLET BY MOUTH EVERY DAY 90 tablet 0   venlafaxine (EFFEXOR) 37.5 MG tablet TAKE 1 TABLET BY MOUTH TWICE A DAY 180 tablet 0   loperamide (IMODIUM) 2 MG capsule Take 2 mg by mouth as needed for diarrhea or loose stools. (Patient not taking: Reported on 11/03/2023)     methocarbamol (ROBAXIN) 500 MG tablet Take 1 tablet (500 mg total) by mouth every 6 (six) hours as needed. (Patient not taking: Reported on 11/03/2023) 20 tablet 0   oxyCODONE (ROXICODONE) 5 MG immediate release tablet Take 1-2 tablets (5-10 mg total) by mouth every 4 (four) hours as needed for severe pain. (Patient not taking: Reported on 10/10/2023) 56 tablet 0   No facility-administered medications prior to visit.     Allergies  Allergen Reactions   Sulfasalazine Other (See Comments)    Flu like feelings   Statins Other (See Comments)    REACTION: Myalgia    Social History   Tobacco Use   Smoking status: Never   Smokeless tobacco: Never  Vaping Use   Vaping status: Never  Used  Substance Use Topics   Alcohol use: Not Currently   Drug use: No    Family History  Problem Relation Age of Onset   Melanoma Father    Heart disease Father    Healthy Mother    Colon cancer Neg Hx    Esophageal cancer Neg Hx     Objective:   Vitals:   11/03/23 1324  BP: 121/78  Pulse: 99  Temp: 98.3 F (36.8 C)  TempSrc: Temporal  SpO2: 97%  Weight: 188 lb (85.3 kg)  Height: 5\' 9"  (1.753 m)   Body mass index is 27.76 kg/m.  Physical Exam Constitutional:      General: He is not in acute distress.    Appearance: He is normal weight. He is not toxic-appearing.  HENT:     Head: Normocephalic and atraumatic.     Right Ear: External ear  normal.     Left Ear: External ear normal.     Nose: No congestion or rhinorrhea.     Mouth/Throat:     Mouth: Mucous membranes are moist.     Pharynx: Oropharynx is clear.  Eyes:     Extraocular Movements: Extraocular movements intact.     Conjunctiva/sclera: Conjunctivae normal.     Pupils: Pupils are equal, round, and reactive to light.  Cardiovascular:     Rate and Rhythm: Normal rate and regular rhythm.     Heart sounds: No murmur heard.    No friction rub. No gallop.  Pulmonary:     Effort: Pulmonary effort is normal.     Breath sounds: Normal breath sounds.  Abdominal:     General: Abdomen is flat. Bowel sounds are normal.     Palpations: Abdomen is soft.  Musculoskeletal:        General: No swelling. Normal range of motion.     Cervical back: Normal range of motion and neck supple.  Skin:    General: Skin is warm and dry.  Neurological:     General: No focal deficit present.     Mental Status: He is oriented to person, place, and time.  Psychiatric:        Mood and Affect: Mood normal.     Lab Results: Lab Results  Component Value Date   WBC 9.0 10/10/2023   HGB 9.8 (L) 10/10/2023   HCT 30.0 (L) 10/10/2023   MCV 90.1 10/10/2023   PLT 477 (H) 10/10/2023    Lab Results  Component Value Date    CREATININE 1.43 (H) 10/10/2023   BUN 15 10/10/2023   NA 140 10/10/2023   K 4.0 10/10/2023   CL 105 10/10/2023   CO2 24 10/10/2023    Lab Results  Component Value Date   ALT 54 (H) 09/10/2023   AST 29 09/10/2023   ALKPHOS 84 08/01/2023   BILITOT 0.4 09/10/2023     Assessment & Plan:   #R knee PJI SP resection RTKA and spacer placement on 10/9 - Present fluid noted in OR, cultures with no growth -Cefazolin tll  11/20. Pt sees derm for rash on knee, weepeing at one point improved with steroid cream. States same thing happened after initial knee surgery. No impact of doxy notes(he stoope d aking it)  #Rash on left lef -Initially derm had thought it was impitgo->rx doxy which did not help per pt and he stopped taking it. Significnaly improved with sterid cream -Sees derm on Friday and will update [place] to see if any c/f infecitous etiology. He will update clinic and if no concern then ok to stop abx on 11/20 -F/U with ID on 12/10    #Medication monitoring-11/4 WBC 5.4K, 800 eosinophil, 1.15 creatinine, ESR 14, CRP 2 #Diarrhea - C. difficile testing negative - Improved   #Crohn's disease on Remicade - Patient consulted with GI and noted that Remicade needs to be held 2 weeks after completing antibiotic therapy with.  Last dose of Levaquin on 7/25 -Pt states he may run out aid for remicade if not taken by end of year.  Danelle Earthly, MD [facility]   11/03/23  1:37 PM   I have personally spent 42 minutes involved in face-to-face and non-face-to-face activities for this patient on the day of the visit. Professional time spent includes the following activities: Preparing to see the patient (review of tests), Obtaining and/or reviewing separately obtained history (  admission/discharge record), Performing a medically appropriate examination and/or evaluation , Ordering medications/tests/procedures, referring and communicating with  other health care professionals, Documenting clinical information in the EMR, Independently interpreting results (not separately reported), Communicating results to the patient/family/caregiver, Counseling and educating the patient/family/caregiver and Care coordination (not separately reported).

## 2023-11-06 ENCOUNTER — Telehealth: Payer: Self-pay

## 2023-11-06 NOTE — Telephone Encounter (Signed)
Per Dr. Thedore Mins, okay to pull PICC after last dose on 11/20. Orders sent to Jeri Modena, RN with [facility].  Sandie Ano, RN

## 2023-11-07 ENCOUNTER — Encounter: Payer: Self-pay | Admitting: Internal Medicine

## 2023-11-07 ENCOUNTER — Telehealth: Payer: Self-pay

## 2023-11-07 DIAGNOSIS — K2100 Gastro-esophageal reflux disease with esophagitis, without bleeding: Secondary | ICD-10-CM | POA: Diagnosis not present

## 2023-11-07 DIAGNOSIS — B957 Other staphylococcus as the cause of diseases classified elsewhere: Secondary | ICD-10-CM | POA: Diagnosis not present

## 2023-11-07 DIAGNOSIS — L0100 Impetigo, unspecified: Secondary | ICD-10-CM | POA: Diagnosis not present

## 2023-11-07 DIAGNOSIS — T8453XA Infection and inflammatory reaction due to internal right knee prosthesis, initial encounter: Secondary | ICD-10-CM | POA: Diagnosis not present

## 2023-11-07 DIAGNOSIS — T8453XD Infection and inflammatory reaction due to internal right knee prosthesis, subsequent encounter: Secondary | ICD-10-CM | POA: Diagnosis not present

## 2023-11-07 DIAGNOSIS — K449 Diaphragmatic hernia without obstruction or gangrene: Secondary | ICD-10-CM | POA: Diagnosis not present

## 2023-11-07 DIAGNOSIS — I10 Essential (primary) hypertension: Secondary | ICD-10-CM | POA: Diagnosis not present

## 2023-11-07 DIAGNOSIS — G4733 Obstructive sleep apnea (adult) (pediatric): Secondary | ICD-10-CM | POA: Diagnosis not present

## 2023-11-07 DIAGNOSIS — E785 Hyperlipidemia, unspecified: Secondary | ICD-10-CM | POA: Diagnosis not present

## 2023-11-07 DIAGNOSIS — F32A Depression, unspecified: Secondary | ICD-10-CM | POA: Diagnosis not present

## 2023-11-07 DIAGNOSIS — K5090 Crohn's disease, unspecified, without complications: Secondary | ICD-10-CM | POA: Diagnosis not present

## 2023-11-07 NOTE — Telephone Encounter (Signed)
Sanique, RN wit [facility] called to report that patient PICC line is leaking when administering antibiotics. Nurse hasn't been out to the home to evaluate PICC line.   Per Dr.Singh if RN is not able to go to evaluate PICC line, patient will need to go to the ED.  Called RN back to inform her of verbal orders per Dr.Singh - RN stated that patient was unsure if it was due to wrapping melting on PICC line dressing due to wrapping arm too tight while taking a shower.   RN will go out to evaluate PICC line, if it is leaking patient aware he will need to go to the ED.   Jenniferann Stuckert Lesli Albee, CMA

## 2023-11-09 ENCOUNTER — Other Ambulatory Visit: Payer: Self-pay | Admitting: Cardiology

## 2023-11-10 ENCOUNTER — Other Ambulatory Visit: Payer: Self-pay | Admitting: Internal Medicine

## 2023-11-10 ENCOUNTER — Telehealth: Payer: Self-pay

## 2023-11-10 DIAGNOSIS — F32A Depression, unspecified: Secondary | ICD-10-CM | POA: Diagnosis not present

## 2023-11-10 DIAGNOSIS — K5090 Crohn's disease, unspecified, without complications: Secondary | ICD-10-CM | POA: Diagnosis not present

## 2023-11-10 DIAGNOSIS — G4733 Obstructive sleep apnea (adult) (pediatric): Secondary | ICD-10-CM | POA: Diagnosis not present

## 2023-11-10 DIAGNOSIS — B957 Other staphylococcus as the cause of diseases classified elsewhere: Secondary | ICD-10-CM | POA: Diagnosis not present

## 2023-11-10 DIAGNOSIS — I10 Essential (primary) hypertension: Secondary | ICD-10-CM | POA: Diagnosis not present

## 2023-11-10 DIAGNOSIS — T8453XA Infection and inflammatory reaction due to internal right knee prosthesis, initial encounter: Secondary | ICD-10-CM | POA: Diagnosis not present

## 2023-11-10 DIAGNOSIS — K449 Diaphragmatic hernia without obstruction or gangrene: Secondary | ICD-10-CM | POA: Diagnosis not present

## 2023-11-10 DIAGNOSIS — E785 Hyperlipidemia, unspecified: Secondary | ICD-10-CM | POA: Diagnosis not present

## 2023-11-10 DIAGNOSIS — K2100 Gastro-esophageal reflux disease with esophagitis, without bleeding: Secondary | ICD-10-CM | POA: Diagnosis not present

## 2023-11-10 MED ORDER — LINEZOLID 600 MG PO TABS: 600.0000 mg | ORAL_TABLET | Freq: Two times a day (BID) | ORAL | 0 refills | Status: DC

## 2023-11-10 NOTE — Progress Notes (Signed)
Pull picc Blood C Linezolid x 7 days

## 2023-11-10 NOTE — Telephone Encounter (Signed)
Sanique, RN with [facility] called to report she is at patient home and patient PICC line has a rash and area is irritated, fluid leaking from PICC line and dressing is wet.   Per Dr.Singh - okay to remove PICC line and will send in Linezolid to complete abx course on 11/20. RX will be sent to [facility] in [place]. If arm looks infected from PICC line - blood cx will need to be done. Per RN she doesn't think arm is infected, thinks the rash and irritation is from the wet dressing. Patient aware to monitor arm to see if rash or redness worsens once PICC line is removed.    Shaine Mount Lesli Albee, CMA

## 2023-11-11 ENCOUNTER — Ambulatory Visit
Admission: EM | Admit: 2023-11-11 | Discharge: 2023-11-11 | Disposition: A | Discharge status: Home / Self Care | Payer: Medicare HMO | Attending: Family Medicine | Admitting: Family Medicine

## 2023-11-11 DIAGNOSIS — L853 Xerosis cutis: Secondary | ICD-10-CM

## 2023-11-11 DIAGNOSIS — L239 Allergic contact dermatitis, unspecified cause: Secondary | ICD-10-CM | POA: Diagnosis not present

## 2023-11-11 DIAGNOSIS — B86 Scabies: Secondary | ICD-10-CM | POA: Diagnosis not present

## 2023-11-11 MED ORDER — PERMETHRIN 5 % EX CREA: TOPICAL_CREAM | CUTANEOUS | 0 refills | Status: DC

## 2023-11-11 NOTE — Discharge Instructions (Addendum)
Read scabies information May use the triamcinolone to the rash on your arm and to the scabies patches.  2 x a day until clears For the dry skin on your knee I recommend a heavy lotion like Eucerin 2 x a day May take benadryl for the itch Call if not improving by the weekend

## 2023-11-11 NOTE — ED Provider Notes (Signed)
[facility]    CSN: [id number] Arrival date & time: 11/11/23  1001      History   Chief Complaint Chief Complaint  Patient presents with   Rash    HPI Tyler Gates is a 67 y.o. male.   HPI  I have seen Tyler Gates before for a rash on his right knee.  I determined that it was contact dermatitis from using Neosporin underneath a occlusive dressing postop.  He was treated with triamcinolone and did well.  He is back today for another rash.  He states that he had a PICC line inserted into his right arm for months to deliver antibiotics.  He had a staph infection in his knee replacement.  Where the PICC line was removed the adhesive is left a large area of rash on his arm.  There is a picture and record of this.  I believe it is a contact dermatitis.  Over the last couple of weeks has had a progressively worsening rash around his middle.  He states that it itches so bad he cannot stand it.  He has never had a rash itch this terribly.  He has had exposure to other people, no travel, he has been at home for his IV treatment.  He does not know where he might have gotten this rash  Past Medical History:  Diagnosis Date   Arthritis    Crohn's disease (HCC)    Depression    Dyspnea on exertion    GERD (gastroesophageal reflux disease)    Herpes    History of kidney stones    Hyperlipidemia    Hypertension    Instability of knee joint    Left displaced femoral neck fracture (HCC) 07/11/2017   OSA (obstructive sleep apnea)    mild   Snoring     Patient Active Problem List   Diagnosis Date Noted   Diarrhea 10/10/2023   Prosthetic joint infection (HCC) 10/10/2023   Medication management 10/10/2023   Hypotension due to hypovolemia 10/10/2023   Infection of total right knee replacement, subsequent encounter 10/01/2023   Infection of total right knee replacement (HCC) 08/27/2023   Rash 08/01/2023   Body aches 08/01/2023   Osteoarthritis of right knee 04/23/2023    Preoperative clearance 02/11/2023   Chronic sinusitis 03/12/2022   Visual disturbance 03/12/2022   Acute sinusitis 01/29/2022   Chronic kidney disease (CKD) stage G2/A2, mildly decreased glomerular filtration rate (GFR) between 60-89 mL/min/1.73 square meter and albuminuria creatinine ratio between 30-299 mg/g 10/22/2018   Hypogonadism male 10/21/2018   Screening PSA (prostate specific antigen) 10/21/2018   Chronic kidney disease due to benign hypertension 10/21/2018   Depression, major, recurrent, in remission (HCC) 10/21/2018   Statin intolerance 09/14/2018   Agatston coronary artery calcium score greater than 400 08/10/2018   Family history of coronary artery disease 08/10/2018   Crohn's disease of colon without complication (HCC) 05/09/2017   Mucosal abnormality of esophagus    Hiatal hernia    KNEE JOINT INSTABILITY 03/07/2007   Hyperlipidemia 12/30/2006   Essential hypertension 12/30/2006   GERD 12/30/2006    Past Surgical History:  Procedure Laterality Date   COLONOSCOPY  2007   Dr. Ozzie Hoyle normal TI, ascending colon/cecum with mild inflammation noted but biopses without signs of active inflammation, hyperpastic polyp, possible ischemia on path of left colon biopsy   COLONOSCOPY N/A 11/08/2015   Procedure: COLONOSCOPY;  Surgeon: Corbin Ade, MD;  Location: [facility];  Service: Endoscopy;  Laterality: N/A;  ESOPHAGOGASTRODUODENOSCOPY  2007   Dr. Ozzie Hoyle reflux esophagitis, hiatal hernia, negative H.pylori   ESOPHAGOGASTRODUODENOSCOPY N/A 11/08/2015   Procedure: ESOPHAGOGASTRODUODENOSCOPY (EGD);  Surgeon: Corbin Ade, MD;  Location: [facility];  Service: Endoscopy;  Laterality: N/A;  0830 - moved to 8:15 - office to notify   EXCISIONAL TOTAL KNEE ARTHROPLASTY WITH ANTIBIOTIC SPACERS Right 10/01/2023   Procedure: EXCISIONAL TOTAL KNEE ARTHROPLASTY WITH PLACEMENT OF ARTICULATING ANTIBIOTIC SPACER;  Surgeon: Samson Frederic, MD;  Location: [facility];  Service: Orthopedics;   Laterality: Right;   HIP PINNING,CANNULATED Left 07/12/2017   Procedure: CANNULATED HIP PINNING LEFT;  Surgeon: Samson Frederic, MD;  Location: [facility];  Service: Orthopedics;  Laterality: Left;   KNEE ARTHROPLASTY Right 04/23/2023   Procedure: COMPUTER ASSISTED TOTAL KNEE ARTHROPLASTY;  Surgeon: Samson Frederic, MD;  Location: [facility];  Service: Orthopedics;  Laterality: Right;  160       Home Medications    Prior to Admission medications   Medication Sig Start Date End Date Taking? Authorizing Provider  permethrin (ELIMITE) 5 % cream Apply to skin from the neck down.  Wash off after 8 hours 11/11/23  Yes Eustace Moore, MD  amLODipine (NORVASC) 5 MG tablet TAKE 2 TABLETS BY MOUTH EVERY DAY Patient taking differently: Take 7.5 mg by mouth daily. 02/25/23   Lewayne Bunting, MD  aspirin (ASPIRIN CHILDRENS) 81 MG chewable tablet Chew 1 tablet (81 mg total) by mouth 2 (two) times daily with a meal. 10/03/23 11/17/23  Hill, Alain Honey, PA-C  ezetimibe (ZETIA) 10 MG tablet TAKE 1 TABLET BY MOUTH EVERY DAY 10/22/23   Hilty, Lisette Abu, MD  meloxicam (MOBIC) 15 MG tablet Take 15 mg by mouth daily. 07/08/22   [provider]  olmesartan (BENICAR) 20 MG tablet TAKE 1 TABLET BY MOUTH EVERY DAY 08/14/23   Lewayne Bunting, MD  omeprazole (PRILOSEC) 40 MG capsule TAKE 1 CAPSULE (40 MG TOTAL) BY MOUTH IN THE MORNING AND AT BEDTIME. 05/12/23   Ashley Royalty, Selena Batten, DO  ondansetron (ZOFRAN) 4 MG tablet Take 1 tablet (4 mg total) by mouth every 8 (eight) hours as needed for nausea or vomiting. 10/03/23 10/02/24  Clois Dupes, PA-C  rosuvastatin (CRESTOR) 20 MG tablet TAKE 1 TABLET BY MOUTH EVERY DAY 10/27/23   Lewayne Bunting, MD  venlafaxine (EFFEXOR) 37.5 MG tablet TAKE 1 TABLET BY MOUTH TWICE A DAY 07/29/23   Everrett Coombe, DO    Family History Family History  Problem Relation Age of Onset   Melanoma Father    Heart disease Father    Healthy Mother    Colon cancer Neg Hx    Esophageal cancer Neg Hx      Social History Social History   Tobacco Use   Smoking status: Never   Smokeless tobacco: Never  Vaping Use   Vaping status: Never Used  Substance Use Topics   Alcohol use: Not Currently   Drug use: No     Allergies   Sulfasalazine and Statins   Review of Systems Review of Systems See HPI  Physical Exam Triage Vital Signs ED Triage Vitals  Encounter Vitals Group     BP 11/11/23 1007 (!) 139/90     Systolic BP Percentile --      Diastolic BP Percentile --      Pulse Rate 11/11/23 1007 (!) 113     Resp 11/11/23 1007 17     Temp 11/11/23 1007 97.7 F (36.5 C)     Temp Source 11/11/23  1007 Oral     SpO2 11/11/23 1007 98 %     Weight --      Height --      Head Circumference --      Peak Flow --      Pain Score 11/11/23 1009 0     Pain Loc --      Pain Education --      Exclude from Growth Chart --    No data found.  Updated Vital Signs BP (!) 139/90 (BP Location: Left Arm)   Pulse (!) 113   Temp 97.7 F (36.5 C) (Oral)   Resp 17   SpO2 98%      Physical Exam Vitals and nursing note reviewed.  Skin:         This top picture is the rash that is in a band around his abdominal wall and flanks  The bottom picture is the rash on his arm from the PICC line adhesive  UC Treatments / Results  Labs (all labs ordered are listed, but only abnormal results are displayed) Labs Reviewed - No data to display  EKG   Radiology No results found.  Procedures Procedures (including critical care time)  Medications Ordered in UC Medications - No data to display  Initial Impression / Assessment and Plan / UC Course  I have reviewed the triage vital signs and the nursing notes.  Pertinent labs & imaging results that were available during my care of the patient were reviewed by me and considered in my medical decision making (see chart for details).     The rash around his abdomen appears similar to a rash from scabies.  He has not his hands and  wrist or genital area.  He states that it itches terribly.  Think it prudent to offer treatment of Elimite.  Continue with steroids on his other rashes.  Follow-up with his dermatologist Final Clinical Impressions(s) / UC Diagnoses   Final diagnoses:  Allergic contact dermatitis, unspecified trigger  Xerosis of skin  Scabies     Discharge Instructions      Read scabies information May use the triamcinolone to the rash on your arm and to the scabies patches.  2 x a day until clears For the dry skin on your knee I recommend a heavy lotion like Eucerin 2 x a day May take benadryl for the itch Call if not improving by the weekend   ED Prescriptions     Medication Sig Dispense Auth. Provider   permethrin (ELIMITE) 5 % cream Apply to skin from the neck down.  Wash off after 8 hours 60 g Eustace Moore, MD      PDMP not reviewed this encounter.   Eustace Moore, MD 11/11/23 [id number]

## 2023-11-11 NOTE — ED Triage Notes (Signed)
Pt c/o rash to upper RT arm. Had a picc line in his arm that was taken out yesterday. Suspects he may have had a rxn to the dressing.  Also has separate rash on knee and abdomen.

## 2023-11-13 DIAGNOSIS — G4733 Obstructive sleep apnea (adult) (pediatric): Secondary | ICD-10-CM | POA: Diagnosis not present

## 2023-11-13 DIAGNOSIS — K2100 Gastro-esophageal reflux disease with esophagitis, without bleeding: Secondary | ICD-10-CM | POA: Diagnosis not present

## 2023-11-13 DIAGNOSIS — E785 Hyperlipidemia, unspecified: Secondary | ICD-10-CM | POA: Diagnosis not present

## 2023-11-13 DIAGNOSIS — K449 Diaphragmatic hernia without obstruction or gangrene: Secondary | ICD-10-CM | POA: Diagnosis not present

## 2023-11-13 DIAGNOSIS — K5090 Crohn's disease, unspecified, without complications: Secondary | ICD-10-CM | POA: Diagnosis not present

## 2023-11-13 DIAGNOSIS — F32A Depression, unspecified: Secondary | ICD-10-CM | POA: Diagnosis not present

## 2023-11-13 DIAGNOSIS — I10 Essential (primary) hypertension: Secondary | ICD-10-CM | POA: Diagnosis not present

## 2023-11-13 DIAGNOSIS — T8453XA Infection and inflammatory reaction due to internal right knee prosthesis, initial encounter: Secondary | ICD-10-CM | POA: Diagnosis not present

## 2023-11-13 DIAGNOSIS — B957 Other staphylococcus as the cause of diseases classified elsewhere: Secondary | ICD-10-CM | POA: Diagnosis not present

## 2023-11-18 DIAGNOSIS — T8459XD Infection and inflammatory reaction due to other internal joint prosthesis, subsequent encounter: Secondary | ICD-10-CM | POA: Diagnosis not present

## 2023-11-21 ENCOUNTER — Encounter: Payer: Self-pay | Admitting: Family Medicine

## 2023-11-21 DIAGNOSIS — L853 Xerosis cutis: Secondary | ICD-10-CM

## 2023-11-21 DIAGNOSIS — Z1283 Encounter for screening for malignant neoplasm of skin: Secondary | ICD-10-CM

## 2023-11-24 ENCOUNTER — Telehealth: Payer: Self-pay

## 2023-11-24 NOTE — Telephone Encounter (Signed)
Pt called requesting refill of permethrin. States his PCP has set up referral to dermatology. Dr Delton See ok'd one courtesy refill but must f/u with PCP or Derm if no improvement.

## 2023-11-26 ENCOUNTER — Encounter: Payer: Self-pay | Admitting: Family Medicine

## 2023-11-26 ENCOUNTER — Ambulatory Visit (INDEPENDENT_AMBULATORY_CARE_PROVIDER_SITE_OTHER): Payer: Medicare HMO | Admitting: Family Medicine

## 2023-11-26 VITALS — BP 108/71 | HR 101 | Ht 69.0 in | Wt 188.0 lb

## 2023-11-26 DIAGNOSIS — R21 Rash and other nonspecific skin eruption: Secondary | ICD-10-CM

## 2023-11-26 MED ORDER — MOMETASONE FUROATE 0.1 % EX CREA: 1.0000 | TOPICAL_CREAM | Freq: Every day | CUTANEOUS | 1 refills | Status: DC

## 2023-11-26 MED ORDER — PREDNISONE 20 MG PO TABS: 20.0000 mg | ORAL_TABLET | Freq: Two times a day (BID) | ORAL | 0 refills | Status: DC

## 2023-11-26 NOTE — Assessment & Plan Note (Signed)
I do not think that his rash is related to scabies as he feels that rash is similar to rash that he had prior to treatment for his Crohn's.  Additionally, he has not had any improvement with permethrin x 2.  Prescribing topical Elocon as well as short burst of prednisone.  If not improving we discussed getting him into have punch biopsy of one of his lesions.  I will go ahead and place a referral to dermatology as he would like to establish with a dermatologist in the [facility] system.

## 2023-11-26 NOTE — Progress Notes (Signed)
Tyler Gates - 67 y.o. male MRN [id number]  Date of birth: 1956-04-24  Subjective Chief Complaint  Patient presents with   Rash    HPI Tyler Gates is a 67 y.o. here today for follow-up of rash.  He has rash along the trunk.  He also had rash along the right bicep area where PICC line was placed which is thought to be related to adhesive.  He was seen in urgent care recently and started on permethrin due to concern about possible scabies.  He has completed 2 rounds of this without any significant change in his rash.  He does recall having similar rash when his Crohn's has been flared up.  He has been off of his biologic due to infected prosthetic.  He does have some itching associated with the rash.  Denies significant pain.  ROS:  A comprehensive ROS was completed and negative except as noted per HPI [facility] Allergies  Allergen Reactions   Sulfasalazine Other (See Comments)    Flu like feelings   Statins Other (See Comments)    REACTION: Myalgia    Past Medical History:  Diagnosis Date   Arthritis    Crohn's disease (HCC)    Depression    Dyspnea on exertion    GERD (gastroesophageal reflux disease)    Herpes    History of kidney stones    Hyperlipidemia    Hypertension    Instability of knee joint    Left displaced femoral neck fracture (HCC) 07/11/2017   OSA (obstructive sleep apnea)    mild   Snoring     Past Surgical History:  Procedure Laterality Date   COLONOSCOPY  2007   Dr. Ozzie Hoyle normal TI, ascending colon/cecum with mild inflammation noted but biopses without signs of active inflammation, hyperpastic polyp, possible ischemia on path of left colon biopsy   COLONOSCOPY N/A 11/08/2015   Procedure: COLONOSCOPY;  Surgeon: Corbin Ade, MD;  Location: [facility];  Service: Endoscopy;  Laterality: N/A;   ESOPHAGOGASTRODUODENOSCOPY  2007   Dr. Ozzie Hoyle reflux esophagitis, hiatal hernia, negative H.pylori   ESOPHAGOGASTRODUODENOSCOPY N/A 11/08/2015   Procedure:  ESOPHAGOGASTRODUODENOSCOPY (EGD);  Surgeon: Corbin Ade, MD;  Location: [facility];  Service: Endoscopy;  Laterality: N/A;  0830 - moved to 8:15 - office to notify   EXCISIONAL TOTAL KNEE ARTHROPLASTY WITH ANTIBIOTIC SPACERS Right 10/01/2023   Procedure: EXCISIONAL TOTAL KNEE ARTHROPLASTY WITH PLACEMENT OF ARTICULATING ANTIBIOTIC SPACER;  Surgeon: Samson Frederic, MD;  Location: [facility];  Service: Orthopedics;  Laterality: Right;   HIP PINNING,CANNULATED Left 07/12/2017   Procedure: CANNULATED HIP PINNING LEFT;  Surgeon: Samson Frederic, MD;  Location: [facility];  Service: Orthopedics;  Laterality: Left;   KNEE ARTHROPLASTY Right 04/23/2023   Procedure: COMPUTER ASSISTED TOTAL KNEE ARTHROPLASTY;  Surgeon: Samson Frederic, MD;  Location: [facility];  Service: Orthopedics;  Laterality: Right;  160    Social History   Socioeconomic History   Marital status: Married    Spouse name: Jimin Brisco   Number of children: 2   Years of education: 14   Highest education level: Some college, no degree  Occupational History   Occupation: Retired  Tobacco Use   Smoking status: Never   Smokeless tobacco: Never  Vaping Use   Vaping status: Never Used  Substance and Sexual Activity   Alcohol use: Not Currently   Drug use: No   Sexual activity: Not Currently    Birth control/protection: None  Other Topics Concern   Not  on file  Social History Narrative   Lives with wife. He has two children. He enjoys cycling, kayaking and working on model cars.   Social Determinants of Health   Financial Resource Strain: Low Risk  (08/12/2023)   Overall Financial Resource Strain (CARDIA)    Difficulty of Paying Living Expenses: Not hard at all  Food Insecurity: No Food Insecurity (10/20/2023)   Hunger Vital Sign    Worried About Running Out of Food in the Last Year: Never true    Ran Out of Food in the Last Year: Never true  Transportation Needs: No Transportation Needs (10/20/2023)   PRAPARE - Therapist, art (Medical): No    Lack of Transportation (Non-Medical): No  Physical Activity: Sufficiently Active (08/12/2023)   Exercise Vital Sign    Days of Exercise per Week: 3 days    Minutes of Exercise per Session: 60 min  Stress: No Stress Concern Present (10/20/2023)   [employer] of Occupational Health - Occupational Stress Questionnaire    Feeling of Stress : Only a little  Social Connections: Moderately Isolated (10/20/2023)   Social Connection and Isolation Panel [NHANES]    Frequency of Communication with Friends and Family: Three times a week    Frequency of Social Gatherings with Friends and Family: Once a week    Attends Religious Services: Never    Database administrator or Organizations: No    Attends Engineer, structural: Never    Marital Status: Married    Family History  Problem Relation Age of Onset   Melanoma Father    Heart disease Father    Healthy Mother    Colon cancer Neg Hx    Esophageal cancer Neg Hx     Health Maintenance  Topic Date Due   DTaP/Tdap/Td (1 - Tdap) Never done   COVID-19 Vaccine (4 - 2023-24 season) 08/24/2023   INFLUENZA VACCINE  03/22/2024 (Originally 07/24/2023)   Zoster Vaccines- Shingrix (1 of 2) 05/11/2024 (Originally 11/17/1974)   Pneumonia Vaccine 46+ Years old (1 of 1 - PCV) 07/31/2024 (Originally 11/17/2020)   Medicare Annual Wellness (AWV)  08/11/2024   Colonoscopy  05/24/2030   Hepatitis C Screening  Completed   HPV VACCINES  Aged Out     ----------------------------------------------------------------------------------------------------------------------------------------------------------------------------------------------------------------- Physical Exam BP 108/71 (BP Location: Left Arm, Patient Position: Sitting, Cuff Size: Normal)   Pulse (!) 101   Ht 5\' 9"  (1.753 m)   Wt 188 lb (85.3 kg)   SpO2 98%   BMI 27.76 kg/m   Physical Exam Constitutional:      Appearance: Normal  appearance.  Skin:    Comments: He has papular rash along the back and flank area.  Neurological:     General: No focal deficit present.     Mental Status: He is alert.  Psychiatric:        Mood and Affect: Mood normal.        Behavior: Behavior normal.     ------------------------------------------------------------------------------------------------------------------------------------------------------------------------------------------------------------------- Assessment and Plan  Rash I do not think that his rash is related to scabies as he feels that rash is similar to rash that he had prior to treatment for his Crohn's.  Additionally, he has not had any improvement with permethrin x 2.  Prescribing topical Elocon as well as short burst of prednisone.  If not improving we discussed getting him into have punch biopsy of one of his lesions.  I will go ahead and place a referral to dermatology as  he would like to establish with a dermatologist in the [facility] system.   Meds ordered this encounter  Medications   mometasone (ELOCON) 0.1 % cream    Sig: Apply 1 Application topically daily.    Dispense:  45 g    Refill:  1   predniSONE (DELTASONE) 20 MG tablet    Sig: Take 1 tablet (20 mg total) by mouth 2 (two) times daily with a meal.    Dispense:  10 tablet    Refill:  0    No follow-ups on file.    This visit occurred during the SARS-CoV-2 public health emergency.  Safety protocols were in place, including screening questions prior to the visit, additional usage of staff PPE, and extensive cleaning of exam room while observing appropriate contact time as indicated for disinfecting solutions.

## 2023-11-28 ENCOUNTER — Ambulatory Visit: Payer: Medicare HMO | Admitting: Gastroenterology

## 2023-11-28 ENCOUNTER — Encounter: Payer: Self-pay | Admitting: Gastroenterology

## 2023-11-28 VITALS — BP 126/80 | HR 84 | Ht 69.0 in | Wt 191.0 lb

## 2023-11-28 DIAGNOSIS — R21 Rash and other nonspecific skin eruption: Secondary | ICD-10-CM

## 2023-11-28 DIAGNOSIS — K219 Gastro-esophageal reflux disease without esophagitis: Secondary | ICD-10-CM | POA: Diagnosis not present

## 2023-11-28 DIAGNOSIS — K5010 Crohn's disease of large intestine without complications: Secondary | ICD-10-CM | POA: Diagnosis not present

## 2023-11-28 NOTE — Patient Instructions (Addendum)
_______________________________________________________  If your blood pressure at your visit was 140/90 or greater, please contact your primary care physician to follow up on this. _______________________________________________________  If you are age 68 or older, your body mass index should be between 23-30. Your Body mass index is 28.21 kg/m. If this is out of the aforementioned range listed, please consider follow up with your Primary Care Provider. _______________________________________________________  The [facility] providers would like to encourage you to use [facility] to communicate with providers for non-urgent requests or questions.  Due to long hold times on the telephone, sending your provider a message by [facility] may be a faster and more efficient way to get a response.  Please allow 48 business hours for a response.  Please remember that this is for non-urgent requests.  _______________________________________________________'  You will be due to follow up in our office in 6 months.  We will contact you to schedule this appointment.  It was a pleasure to see you today!  Vito Cirigliano, D.O.

## 2023-11-28 NOTE — Progress Notes (Signed)
Chief Complaint:    Crohn's colitis  GI History: 67 year old male with a history of CKD (creatinine ~1.3), GERD, HTN, HLD, follows in the GI clinic for Crohn's Colitis and GERD.   1) Crohn's Disease: longstanding history of steroid responsive Crohn's colitis, previously followed by Dr. Jena Gauss at [facility]. Was treated with budesonide in 10/2019. Restarted Lialda 4.8 g/day in 03/2020 for breakthrough symptoms with good clinical response.  Had breakthrough when weaning to 2.4 g/day.  Responded with slight increase to 3.6 g/day.  Active colitis on colonoscopy in 05/2020, but patient preferred continued Lialda rather than escalation of therapy.  Had issues obtaining Lialda due to insurance.  Breakthrough with Apriso and Entocort.  Good response to repeat trial of Uceris, but no durable relief.  Finally able to get back on Lialda in 07/2021.  Continued breakthrough symptoms despite high-dose Lialda and eventually transition to Remicade in 04/2022 (Humira was unaffordable and did not receive assistance through [employer] assist). - ESR/CRP not reliably elevated during flare - Fecal calprotectin will elevate with flares - Has a sulfa allergy-cannot do sulfasalazine   He is an avid mountain biker.     IBD History:  Diagnosed in his 20's Index symptoms: Abdominal cramping, loose, nonbloody stools. No UGI sxs. No nocturnal stools or tenesmus.    Evaluation to date: - TPMT: N/A - TB testing: Negative QuantiFERON gold - HBV status: Negative.  Completed hepatitis A/B vaccine series - Pertinent Imaging: None for review - Last colonoscopy:  05/2020 - Small bowel imaging: None - History of EIMs: None, although does describe some vague skin rash on back that improves with steroids.  Has seen Dermatology   Current medication: Remicade Previous medications: Budesonide ER, Pentasa, Apriso, Lialda, Azulfidine, prednisone.     Health Maintenance: - DEXA: Declined - Vaccinations:      - Annual Flu Vaccine  -UTD      - Pneumococcal Vaccine if receiving immunosuppression: Prevnar 20 with PCM      - Zoster vaccine if over age 26: Obtained through [facility] - Micronutrient eval:      - Annual Vit D, B6, iron panel: Up-to-date      - Ileal disease: B12, fat soluble vitamins: Up-to-date - Surveillance colonoscopy: Will discuss again at follow-up given all of his other more acute pressing medical issues - Surveillance labs for immunomodulators: N/A - Annual depression screening: None - Annual Dermatology/Skin exam: Has seen previously.  Appointment scheduled for next week   Endoscopic Hx: -Colonoscopy (05/2020, Dr. Barron Alvine): Moderate, active colitis (erythema, edema, aphthous ulcers) from transverse colon to cecum.  Mild inflammation in the left colon in a patchy distribution with rectal sparing.  Sigmoid diverticulosis.  Internal hemorrhoids.  Normal TI.  Benign rectal hyperplastic polyps -Colonoscopy (10/2015, Dr. Jena Gauss): Mild inflammatory changes, mild to moderate chronic colitis predominantly on the right sided endoscopically, for biopsies throughout colon with Crohn's disease, aside from rectal sparing.  Normal TI -EGD (02/01/2015, Dr. Jena Gauss): 1 cm salmon-colored tongue x2 (biopsy: Gastritis), patulous EG junction with 2-3 cm HH, normal stomach and duodenum -Colonoscopy (2007, Dr. Ozzie Hoyle): Normal TI, ascending colon/cecum mild inflammation but biopsies without active inflammation, hyperplastic polyp, possible ischemia on path left colon biopsy   2) GERD: History of GERD and hiatal hernia.  Reflux symptoms well controlled on Nexium 40 mg BID.  Was previously treated with omeprazole 40 mg bid.    HPI:     Patient is a 67 y.o. male presenting to the Gastroenterology Clinic for follow-up.  Was last  seen by me in the office on 04/12/2023.  Was feeling well at that time and Crohn's in clinical remission on infliximab.  Since that time underwent right TKA on 04/23/2023 c/b infected hardware with MSSE on right  knee aspiration on 08/03/2023.  Admitted to the hospital on 09/29/2023 with resection of right TKA and spacer placement on 10/9.  Was treated with cefazolin x 6 weeks.  Remicade has been on hold since 07/17/2023 due to infection.  Did develop diarrhea in October, but infectious panel was negative.  Started probiotic with improvement. Cefazolin completed on 11/20.  Today, he states he has no GI symptoms.  Tolerating p.o. intake and has been having normal, formed stools.  Currently on Prednisone 40 mg/day x5 days for pruritic rash on back with rapid improvement in pruritus.  Interestingly, he describes having similar rashes on his back over the years, always improving with steroids.  Has an appointment with Dermatology on Monday.  Scheduled for follow-up with Orthopedic Surgery next week with repeat knee aspiration and ID follow-up on 12/16.  He is hoping that if aspiration negative for infection that he will undergo knee replacement surgery in the very near future.    Review of systems:     No chest pain, no SOB, no fevers, no urinary sx   Past Medical History:  Diagnosis Date   Arthritis    Crohn's disease (HCC)    Depression    Dyspnea on exertion    GERD (gastroesophageal reflux disease)    Herpes    History of kidney stones    Hyperlipidemia    Hypertension    Instability of knee joint    Left displaced femoral neck fracture (HCC) 07/11/2017   OSA (obstructive sleep apnea)    mild   Snoring     Patient's surgical history, family medical history, social history, medications and allergies were all reviewed in Epic    Current Outpatient Medications  Medication Sig Dispense Refill   amLODipine (NORVASC) 5 MG tablet TAKE 2 TABLETS BY MOUTH EVERY DAY (Patient taking differently: Take 7.5 mg by mouth daily.) 180 tablet 0   ezetimibe (ZETIA) 10 MG tablet TAKE 1 TABLET BY MOUTH EVERY DAY 90 tablet 3   meloxicam (MOBIC) 15 MG tablet Take 15 mg by mouth daily.     mometasone (ELOCON) 0.1 %  cream Apply 1 Application topically daily. 45 g 1   olmesartan (BENICAR) 20 MG tablet TAKE 1 TABLET BY MOUTH EVERY DAY 90 tablet 3   ondansetron (ZOFRAN) 4 MG tablet Take 1 tablet (4 mg total) by mouth every 8 (eight) hours as needed for nausea or vomiting. 30 tablet 0   predniSONE (DELTASONE) 20 MG tablet Take 1 tablet (20 mg total) by mouth 2 (two) times daily with a meal. 10 tablet 0   rosuvastatin (CRESTOR) 20 MG tablet TAKE 1 TABLET BY MOUTH EVERY DAY 90 tablet 0   venlafaxine (EFFEXOR) 37.5 MG tablet TAKE 1 TABLET BY MOUTH TWICE A DAY 180 tablet 0   ceFAZolin (ANCEF) 10 g injection  (Patient not taking: Reported on 11/28/2023)     doxycycline (VIBRA-TABS) 100 MG tablet Take 100 mg by mouth 2 (two) times daily. (Patient not taking: Reported on 11/28/2023)     hydrOXYzine (ATARAX) 25 MG tablet Take 25 mg by mouth 3 (three) times daily. (Patient not taking: Reported on 11/28/2023)     mupirocin ointment (BACTROBAN) 2 % Apply 1 Application topically daily. (Patient not taking: Reported on 11/28/2023)  omeprazole (PRILOSEC) 40 MG capsule TAKE 1 CAPSULE (40 MG TOTAL) BY MOUTH IN THE MORNING AND AT BEDTIME. (Patient not taking: Reported on 11/28/2023) 180 capsule 3   permethrin (ELIMITE) 5 % cream Apply to skin from the neck down.  Wash off after 8 hours (Patient not taking: Reported on 11/28/2023) 60 g 0   No current facility-administered medications for this visit.    Physical Exam:     BP 126/80   Pulse 84   Ht 5\' 9"  (1.753 m)   Wt 191 lb (86.6 kg)   BMI 28.21 kg/m   GENERAL:  Pleasant male in NAD PSYCH: : Cooperative, normal affect Musculoskeletal:  Normal muscle tone, normal strength NEURO: Alert and oriented x 3, no focal neurologic deficits   IMPRESSION and PLAN:    1) Crohn's colitis 67 year old male with longstanding history of steroid responsive Crohn's colitis.  Previously responsive to Lialda, but eventually lost efficacy, and transitioned to Remicade in 04/2022 with good  response.    Remicade stopped due to infected right TKA hardware over the summer.  Has been without any IBD therapy since 06/2023, but thankfully currently without any GI symptoms.  Interestingly, he does have rashes on his back that are improving since starting prednisone, and he states tends to respond to steroids (and IBD therapy?) in the past.  Possibly atypical pyoderma gangrenosum?  Has appoint with Dermatology on Monday.  Additionally, states that he may have knee replacement in the near future depending on whether or not repeat knee aspiration next week is sterile.  Given that and his current lack of Crohn's GI symptoms, we mutually agreed to hold off on reinitiating Remicade right now in favor of having follow-up with Orthopedic Surgery and ID this month as scheduled.  - I asked patient to update me after he is seen by Orthopedic Surgery and ID - As above, holding reinitiation of Remicade for the time being in case he does in fact go for repeat TKR soon - To follow-up with Dermatology - Will eventually need DEXA scan after latest round of steroids - Will eventually need repeat colonoscopy to assess for deep remission  2) Skin rash - Has follow-up with Dermatology next week.  I asked him to update me after that appointment  3) GERD - Well-controlled on current therapy  I spent 40 minutes of time, including in depth chart review, independent review of results as outlined above, communicating results with the patient directly, face-to-face time with the patient, coordinating care, ordering studies and medications as appropriate, and documentation.       Verlin Dike Nealy Karapetian ,DO, FACG 11/28/2023, 11:08 AM

## 2023-11-30 ENCOUNTER — Other Ambulatory Visit: Payer: Self-pay | Admitting: Family Medicine

## 2023-11-30 DIAGNOSIS — F3340 Major depressive disorder, recurrent, in remission, unspecified: Secondary | ICD-10-CM

## 2023-12-01 DIAGNOSIS — L400 Psoriasis vulgaris: Secondary | ICD-10-CM | POA: Diagnosis not present

## 2023-12-02 ENCOUNTER — Inpatient Hospital Stay: Payer: Medicare HMO | Admitting: Internal Medicine

## 2023-12-02 DIAGNOSIS — M25561 Pain in right knee: Secondary | ICD-10-CM | POA: Diagnosis not present

## 2023-12-02 DIAGNOSIS — T8452XD Infection and inflammatory reaction due to internal left hip prosthesis, subsequent encounter: Secondary | ICD-10-CM | POA: Diagnosis not present

## 2023-12-02 DIAGNOSIS — M25469 Effusion, unspecified knee: Secondary | ICD-10-CM | POA: Diagnosis not present

## 2023-12-03 ENCOUNTER — Encounter: Payer: Self-pay | Admitting: Gastroenterology

## 2023-12-03 ENCOUNTER — Encounter: Payer: Self-pay | Admitting: Family Medicine

## 2023-12-03 DIAGNOSIS — M25561 Pain in right knee: Secondary | ICD-10-CM | POA: Diagnosis not present

## 2023-12-08 ENCOUNTER — Ambulatory Visit: Payer: Medicare HMO | Admitting: Internal Medicine

## 2023-12-08 ENCOUNTER — Encounter: Payer: Self-pay | Admitting: Internal Medicine

## 2023-12-08 ENCOUNTER — Other Ambulatory Visit: Payer: Self-pay

## 2023-12-08 VITALS — BP 124/83 | HR 99 | Temp 97.8°F | Ht 69.0 in | Wt 188.0 lb

## 2023-12-08 DIAGNOSIS — T8453XD Infection and inflammatory reaction due to internal right knee prosthesis, subsequent encounter: Secondary | ICD-10-CM

## 2023-12-08 NOTE — Patient Instructions (Signed)
Monitor your knee for signs of infection and follow up with Orthopedics regarding the fluid taken from your knee. prophylactic antibiotics for your dental procedure not indicated if labs stable. We will check your labs today for inflammatory markers. Follow up as needed; no scheduled appointment is necessary.

## 2023-12-08 NOTE — Progress Notes (Signed)
Patient Active Problem List   Diagnosis Date Noted   Diarrhea 10/10/2023   Prosthetic joint infection (HCC) 10/10/2023   Medication management 10/10/2023   Hypotension due to hypovolemia 10/10/2023   Infection of total right knee replacement, subsequent encounter 10/01/2023   Infection of prosthetic right knee joint (HCC) 09/22/2023   Infection of total right knee replacement (HCC) 08/27/2023   Rash 08/01/2023   Body aches 08/01/2023   Osteoarthritis of right knee 04/23/2023   Preoperative clearance 02/11/2023   Chronic sinusitis 03/12/2022   Visual disturbance 03/12/2022   Acute sinusitis 01/29/2022   Chronic kidney disease (CKD) stage G2/A2, mildly decreased glomerular filtration rate (GFR) between 60-89 mL/min/1.73 square meter and albuminuria creatinine ratio between 30-299 mg/g 10/22/2018   Hypogonadism male 10/21/2018   Screening PSA (prostate specific antigen) 10/21/2018   Chronic kidney disease due to benign hypertension 10/21/2018   Depression, major, recurrent, in remission (HCC) 10/21/2018   Statin intolerance 09/14/2018   Agatston coronary artery calcium score greater than 400 08/10/2018   Family history of coronary artery disease 08/10/2018   Crohn's disease of colon without complication (HCC) 05/09/2017   Mucosal abnormality of esophagus    Hiatal hernia    KNEE JOINT INSTABILITY 03/07/2007   Hyperlipidemia 12/30/2006   Essential hypertension 12/30/2006   GERD 12/30/2006    Patient's Medications  New Prescriptions   No medications on file  Previous Medications   AMLODIPINE (NORVASC) 5 MG TABLET    TAKE 2 TABLETS BY MOUTH EVERY DAY   CEFAZOLIN (ANCEF) 10 G INJECTION       DOXYCYCLINE (VIBRA-TABS) 100 MG TABLET    Take 100 mg by mouth 2 (two) times daily.   EZETIMIBE (ZETIA) 10 MG TABLET    TAKE 1 TABLET BY MOUTH EVERY DAY   HYDROXYZINE (ATARAX) 25 MG TABLET    Take 25 mg by mouth 3 (three) times daily.   MELOXICAM (MOBIC) 15 MG TABLET    Take 15  mg by mouth daily.   MOMETASONE (ELOCON) 0.1 % CREAM    Apply 1 Application topically daily.   MUPIROCIN OINTMENT (BACTROBAN) 2 %    Apply 1 Application topically daily.   OLMESARTAN (BENICAR) 20 MG TABLET    TAKE 1 TABLET BY MOUTH EVERY DAY   OMEPRAZOLE (PRILOSEC) 40 MG CAPSULE    TAKE 1 CAPSULE (40 MG TOTAL) BY MOUTH IN THE MORNING AND AT BEDTIME.   ONDANSETRON (ZOFRAN) 4 MG TABLET    Take 1 tablet (4 mg total) by mouth every 8 (eight) hours as needed for nausea or vomiting.   PERMETHRIN (ELIMITE) 5 % CREAM    Apply to skin from the neck down.  Wash off after 8 hours   PREDNISONE (DELTASONE) 20 MG TABLET    Take 1 tablet (20 mg total) by mouth 2 (two) times daily with a meal.   ROSUVASTATIN (CRESTOR) 20 MG TABLET    TAKE 1 TABLET BY MOUTH EVERY DAY   VENLAFAXINE (EFFEXOR) 37.5 MG TABLET    TAKE 1 TABLET BY MOUTH TWICE A DAY  Modified Medications   No medications on file  Discontinued Medications   No medications on file    Subjective: 47ym male with past medical history as below including Crohn's disease on Remicade, arthritis, GERD, hyperlipidemia, hypertension, underwent primary right knee TKA on 04/23/2023 with Dr. Linna Caprice presents from PCP office for concern about infected right knee.  He underwent index case for right knee arthritis.Seen by  Elmarie Shiley, DO on 9/4 who noted patient was having some increased knee pain as well as rash and bodyaches after yellowjacket sting.  Noted that orthopedist had withdrew fluid from the knee came back positive Staph epidermidis resistant to tetracycline clindamycin sensitive to oxacillin.  CRP and ESR elevated.  Initially given Levaquin and then placed on Keflex.  He was told that he will likely need surgery to have possible replacement. Pt reprots he devloped a rash around incsion about a mmonth after surgery. The rash was not on incsion and wood ooze clear fluid. Was Rx ceflex about 2 months after srugery. He did not satrt taking it. He went urgent care  and was Rx steroid cream which cleared up rash.  He had aspiration of knee on 8/13 with 37,260 wvc, 88% neutorphils, synavasure + staph, Cx + stpah epi S oxacillin. Pt has spacer placement scheduled on Oct 9th 09/10/23: He reports knee is not painful/swollen. He has a small rash on lateral knee, non pruritic. Steroid cream has not resolved it.  11/03/23 Presents and has PICC SP TKA resection and spacer placement. He has been on amox about 2 days prior to OR. His diarrhea has resolved  Today 12/16 : Discussed the use of AI scribe software for clinical note transcription with the patient, who gave verbal consent to proceed.  History of Present Illness   The patient, with a history of autoimmune disease and Crohn's disease,dermatitis rash. The rash, believed to be an autoimmune reaction seen by Derm.  Rash has been treated with prednisone, which cleared it up temporarily. He completed abx on 11/18 due to reaction around PICC site(likely autoimmune), original EOT 11/20. PT did into take linezolid.   In addition to the rash, the patient is also dealing with concerns about future knee infections. The patient recently had a knee infection, which was treated and the cultures were negative. The patient is worried about the possibility of future infections and is seeking advice on prevention.  The patient is also considering restarting Remicade, a medication for Crohn's disease.   The patient also mentions a dental procedure and the need for amoxicillin.        Review of Systems: Review of Systems  All other systems reviewed and are negative.   Past Medical History:  Diagnosis Date   Arthritis    Crohn's disease (HCC)    Depression    Dyspnea on exertion    GERD (gastroesophageal reflux disease)    Herpes    History of kidney stones    Hyperlipidemia    Hypertension    Instability of knee joint    Left displaced femoral neck fracture (HCC) 07/11/2017   OSA (obstructive sleep apnea)    mild    Snoring     Social History   Tobacco Use   Smoking status: Never   Smokeless tobacco: Never  Vaping Use   Vaping status: Never Used  Substance Use Topics   Alcohol use: Not Currently   Drug use: No    Family History  Problem Relation Age of Onset   Melanoma Father    Heart disease Father    Healthy Mother    Colon cancer Neg Hx    Esophageal cancer Neg Hx     Allergies  Allergen Reactions   Sulfasalazine Other (See Comments)    Flu like feelings   Statins Other (See Comments)    REACTION: Myalgia    Health Maintenance  Topic Date Due   DTaP/Tdap/Td (1 -  Tdap) Never done   COVID-19 Vaccine (4 - 2024-25 season) 08/24/2023   INFLUENZA VACCINE  03/22/2024 (Originally 07/24/2023)   Zoster Vaccines- Shingrix (1 of 2) 05/11/2024 (Originally 11/17/1974)   Pneumonia Vaccine 29+ Years old (1 of 2 - PCV) 07/31/2024 (Originally 11/17/1961)   Medicare Annual Wellness (AWV)  08/11/2024   Colonoscopy  05/24/2030   Hepatitis C Screening  Completed   HPV VACCINES  Aged Out    Objective:  Vitals:   12/08/23 1011  BP: 124/83  Pulse: 99  Temp: 97.8 F (36.6 C)  TempSrc: Temporal  SpO2: 99%  Weight: 188 lb (85.3 kg)  Height: 5\' 9"  (1.753 m)   Body mass index is 27.76 kg/m.  Physical Exam Constitutional:      General: He is not in acute distress.    Appearance: He is normal weight. He is not toxic-appearing.  HENT:     Head: Normocephalic and atraumatic.     Right Ear: External ear normal.     Left Ear: External ear normal.     Nose: No congestion or rhinorrhea.     Mouth/Throat:     Mouth: Mucous membranes are moist.     Pharynx: Oropharynx is clear.  Eyes:     Extraocular Movements: Extraocular movements intact.     Conjunctiva/sclera: Conjunctivae normal.     Pupils: Pupils are equal, round, and reactive to light.  Cardiovascular:     Rate and Rhythm: Normal rate and regular rhythm.     Heart sounds: No murmur heard.    No friction rub. No gallop.   Pulmonary:     Effort: Pulmonary effort is normal.     Breath sounds: Normal breath sounds.  Abdominal:     General: Abdomen is flat. Bowel sounds are normal.     Palpations: Abdomen is soft.  Musculoskeletal:        General: No swelling. Normal range of motion.     Cervical back: Normal range of motion and neck supple.  Skin:    General: Skin is warm and dry.  Neurological:     General: No focal deficit present.     Mental Status: He is oriented to person, place, and time.  Psychiatric:        Mood and Affect: Mood normal.     Lab Results Lab Results  Component Value Date   WBC 9.0 10/10/2023   HGB 9.8 (L) 10/10/2023   HCT 30.0 (L) 10/10/2023   MCV 90.1 10/10/2023   PLT 477 (H) 10/10/2023    Lab Results  Component Value Date   CREATININE 1.43 (H) 10/10/2023   BUN 15 10/10/2023   NA 140 10/10/2023   K 4.0 10/10/2023   CL 105 10/10/2023   CO2 24 10/10/2023    Lab Results  Component Value Date   ALT 54 (H) 09/10/2023   AST 29 09/10/2023   ALKPHOS 84 08/01/2023   BILITOT 0.4 09/10/2023    Lab Results  Component Value Date   CHOL 165 11/06/2022   HDL 40 11/06/2022   LDLCALC 85 11/06/2022   TRIG 239 (H) 11/06/2022   CHOLHDL 4.1 11/06/2022   No results found for: "LABRPR", "RPRTITER" No results found for: "HIV1RNAQUANT", "HIV1RNAVL", "CD4TABS"   Problem List Items Addressed This Visit       Musculoskeletal and Integument   Infection of total right knee replacement, subsequent encounter - Primary   Relevant Orders   Sed Rate (ESR)   CRP (C-Reactive Protein)   CBC with Differential/Platelet  COMPLETE METABOLIC PANEL WITH GFR   Results   PATHOLOGY Knee fluid culture: Negative (12/01/2023)      Assessment/Plan #R knee PJI SP resection RTKA and spacer placement on 10/9 - Purulent fluid noted in OR, cultures with no growth -Cefazolin tll  11/20. He had issues with PICC, cefazolin staopped on 11/18 and RX linezolid x 1 week(pt did not take).  PICC line  removed due to rash(autommimune). No current signs of infection. -Order ESR and CRP to assess for inflammation. -Continue to monitor knee for signs of infection. -Follow up with Orthopedics regarding fluid taken from knee on 12/01/2023.  Autoimmune Dermatitis Recent rash on back and leg, initially improved with a 5-day course of prednisone but now returning. Dermatology is managing with a new non-steroidal cream. -Continue to follow up with Dermatology for management. -Start new non-steroidal cream as directed by Dermatology.  Crohn's Disease Currently asymptomatic, discussion of restarting Remicade. -Decision to restart Remicade to be made in conjunction with Gastroenterology.   Dental Care -Advised patient that there is no current indication for prophylactic antibiotics for dental procedures if labs today stable.                 Danelle Earthly, MD [facility] 12/08/2023, 2:24 PM   I have personally spent 45 minutes involved in face-to-face and non-face-to-face activities for this patient on the day of the visit. Professional time spent includes the following activities: Preparing to see the patient (review of tests), Obtaining and/or reviewing separately obtained history (admission/discharge record), Performing a medically appropriate examination and/or evaluation , Ordering medications/tests/procedures, referring and communicating with other health care professionals, Documenting clinical information in the EMR, Independently interpreting results (not separately reported), Communicating results to the patient/family/caregiver, Counseling and educating the patient/family/caregiver and Care coordination (not separately reported).

## 2023-12-09 ENCOUNTER — Other Ambulatory Visit: Payer: Self-pay | Admitting: Family Medicine

## 2023-12-09 LAB — COMPLETE METABOLIC PANEL WITH GFR
AG Ratio: 1.2 (calc) (ref 1.0–2.5)
ALT: 23 U/L (ref 9–46)
AST: 19 U/L (ref 10–35)
Albumin: 4.3 g/dL (ref 3.6–5.1)
Alkaline phosphatase (APISO): 74 U/L (ref 35–144)
BUN: 20 mg/dL (ref 7–25)
CO2: 26 mmol/L (ref 20–32)
Calcium: 9.8 mg/dL (ref 8.6–10.3)
Chloride: 102 mmol/L (ref 98–110)
Creat: 1.3 mg/dL (ref 0.70–1.35)
Globulin: 3.6 g/dL (ref 1.9–3.7)
Glucose, Bld: 104 mg/dL — ABNORMAL HIGH (ref 65–99)
Potassium: 4.3 mmol/L (ref 3.5–5.3)
Sodium: 138 mmol/L (ref 135–146)
Total Bilirubin: 0.5 mg/dL (ref 0.2–1.2)
Total Protein: 7.9 g/dL (ref 6.1–8.1)
eGFR: 60 mL/min/{1.73_m2} (ref >=60)

## 2023-12-09 LAB — CBC WITH DIFFERENTIAL/PLATELET
Absolute Lymphocytes: 1137 {cells}/uL (ref 850–3900)
Absolute Monocytes: 847 {cells}/uL (ref 200–950)
Basophils Absolute: 33 {cells}/uL (ref 0–200)
Basophils Relative: 0.4 %
Eosinophils Absolute: 208 {cells}/uL (ref 15–500)
Eosinophils Relative: 2.5 %
HCT: 42.9 % (ref 38.5–50.0)
Hemoglobin: 13.9 g/dL (ref 13.2–17.1)
MCH: 29.2 pg (ref 27.0–33.0)
MCHC: 32.4 g/dL (ref 32.0–36.0)
MCV: 90.1 fL (ref 80.0–100.0)
MPV: 11.1 fL (ref 7.5–12.5)
Monocytes Relative: 10.2 %
Neutro Abs: 6076 {cells}/uL (ref 1500–7800)
Neutrophils Relative %: 73.2 %
Platelets: 290 10*3/uL (ref 140–400)
RBC: 4.76 10*6/uL (ref 4.20–5.80)
RDW: 12.7 % (ref 11.0–15.0)
Total Lymphocyte: 13.7 %
WBC: 8.3 10*3/uL (ref 3.8–10.8)

## 2023-12-09 LAB — C-REACTIVE PROTEIN: CRP: 5.7 mg/L (ref <8.0)

## 2023-12-09 LAB — SEDIMENTATION RATE: Sed Rate: 28 mm/h — ABNORMAL HIGH (ref 0–20)

## 2023-12-30 DIAGNOSIS — T8453XD Infection and inflammatory reaction due to internal right knee prosthesis, subsequent encounter: Secondary | ICD-10-CM | POA: Diagnosis not present

## 2023-12-31 ENCOUNTER — Telehealth: Payer: Self-pay

## 2023-12-31 NOTE — Telephone Encounter (Signed)
 Preop televisit scheduled, med rec and consent done

## 2023-12-31 NOTE — Telephone Encounter (Signed)
   Name: Tyler Gates  DOB: July 30, 1956  MRN: [id number]  Primary Cardiologist: Redell Shallow, MD   Preoperative team, please contact this patient and set up a phone call appointment for further preoperative risk assessment. Please obtain consent and complete medication review. Thank you for your help.  I confirm that guidance regarding antiplatelet and oral anticoagulation therapy has been completed and, if necessary, noted below.  None requested.  I also confirmed the patient resides in the state of [place] . As per [facility] Medical Board telemedicine laws, the patient must reside in the state in which the provider is licensed.   Barnie Hila, NP 12/31/2023, 11:39 AM [facility]

## 2023-12-31 NOTE — Telephone Encounter (Signed)
  Patient Consent for Virtual Visit        Tyler Gates has provided verbal consent on 12/31/2023 for a virtual visit (video or telephone).   CONSENT FOR VIRTUAL VISIT FOR:  Tyler Gates  By participating in this virtual visit I agree to the following:  I hereby voluntarily request, consent and authorize [facility] and its employed or contracted physicians, physician assistants, nurse practitioners or other licensed health care professionals (the Practitioner), to provide me with telemedicine health care services (the "Services) as deemed necessary by the treating Practitioner. I acknowledge and consent to receive the Services by the Practitioner via telemedicine. I understand that the telemedicine visit will involve communicating with the Practitioner through live audiovisual communication technology and the disclosure of certain medical information by electronic transmission. I acknowledge that I have been given the opportunity to request an in-person assessment or other available alternative prior to the telemedicine visit and am voluntarily participating in the telemedicine visit.  I understand that I have the right to withhold or withdraw my consent to the use of telemedicine in the course of my care at any time, without affecting my right to future care or treatment, and that the Practitioner or I may terminate the telemedicine visit at any time. I understand that I have the right to inspect all information obtained and/or recorded in the course of the telemedicine visit and may receive copies of available information for a reasonable fee.  I understand that some of the potential risks of receiving the Services via telemedicine include:  Delay or interruption in medical evaluation due to technological equipment failure or disruption; Information transmitted may not be sufficient (e.g. poor resolution of images) to allow for appropriate medical decision making by the Practitioner;  and/or  In rare instances, security protocols could fail, causing a breach of personal health information.  Furthermore, I acknowledge that it is my responsibility to provide information about my medical history, conditions and care that is complete and accurate to the best of my ability. I acknowledge that Practitioner's advice, recommendations, and/or decision may be based on factors not within their control, such as incomplete or inaccurate data provided by me or distortions of diagnostic images or specimens that may result from electronic transmissions. I understand that the practice of medicine is not an exact science and that Practitioner makes no warranties or guarantees regarding treatment outcomes. I acknowledge that a copy of this consent can be made available to me via my patient portal [facility] MyChart), or I can request a printed copy by calling the office of [facility].    I understand that my insurance will be billed for this visit.   I have read or had this consent read to me. I understand the contents of this consent, which adequately explains the benefits and risks of the Services being provided via telemedicine.  I have been provided ample opportunity to ask questions regarding this consent and the Services and have had my questions answered to my satisfaction. I give my informed consent for the services to be provided through the use of telemedicine in my medical care

## 2023-12-31 NOTE — Telephone Encounter (Signed)
   Pre-operative Risk Assessment    Patient Name: Tyler Gates  DOB: 07/25/1956 MRN: [id number]   Date of last office visit: 09/24/2023 Date of next office visit: Not Scheduled   Request for Surgical Clearance    Procedure:   Reimplantation of right total knee arthroplasty  Date of Surgery:  Clearance TBD                                Surgeon:  Dr. Redell Shoals Surgeon's Group or Practice Name:  [facility] Phone number:  [phone] Fax number:  [phone]   Type of Clearance Requested:   - Medical    Type of Anesthesia:  Spinal   Additional requests/questions:    Bonney Ival LOISE Gerome   12/31/2023, 8:18 AM

## 2024-01-13 ENCOUNTER — Ambulatory Visit: Payer: Medicare HMO | Attending: Cardiology | Admitting: Emergency Medicine

## 2024-01-13 ENCOUNTER — Encounter: Payer: Self-pay | Admitting: Family Medicine

## 2024-01-13 DIAGNOSIS — Z01810 Encounter for preprocedural cardiovascular examination: Secondary | ICD-10-CM | POA: Diagnosis not present

## 2024-01-13 NOTE — Progress Notes (Signed)
Virtual Visit via Telephone Note   Because of Tyler Gates's co-morbid illnesses, he is at least at moderate risk for complications without adequate follow up.  This format is felt to be most appropriate for this patient at this time.  The patient did not have access to video technology/had technical difficulties with video requiring transitioning to audio format only (telephone).  All issues noted in this document were discussed and addressed.  No physical exam could be performed with this format.  Please refer to the patient's chart for his consent to telehealth for [facility].  Evaluation Performed:  Preoperative cardiovascular risk assessment _____________   Date:  01/13/2024   Patient ID:  Tyler Gates, DOB 1956-01-14, MRN [id number] Patient Location:  Home Provider location:   Office  Primary Care Provider:  Everrett Coombe, DO Primary Cardiologist:  Olga Millers, MD  Chief Complaint / Patient Profile   68 y.o. y/o male with a h/o CAD, HTN, HLD who is pending reimplantation of right total knee arthroplasty with [facility] by Dr. Linna Caprice on date TBD and presents today for telephonic preoperative cardiovascular risk assessment.  History of Present Illness    Tyler Gates is a 68 y.o. male who presents via audio/video conferencing for a telehealth visit today.  Pt was last seen in cardiology clinic on 09/24/2023 by Dr. Jens Som.  At that time Tyler Gates was doing well.  The patient is now pending procedure as outlined above. Since his last visit, he denies chest pain, shortness of breath, lower extremity edema, fatigue, palpitations, melena, hematuria, hemoptysis, diaphoresis, weakness, presyncope, syncope, orthopnea, and PND.  Past Medical History    Past Medical History:  Diagnosis Date   Arthritis    Crohn's disease (HCC)    Depression    Dyspnea on exertion    GERD (gastroesophageal reflux disease)    Herpes    History of kidney stones     Hyperlipidemia    Hypertension    Instability of knee joint    Left displaced femoral neck fracture (HCC) 07/11/2017   OSA (obstructive sleep apnea)    mild   Snoring    Past Surgical History:  Procedure Laterality Date   COLONOSCOPY  2007   Dr. Ozzie Hoyle normal TI, ascending colon/cecum with mild inflammation noted but biopses without signs of active inflammation, hyperpastic polyp, possible ischemia on path of left colon biopsy   COLONOSCOPY N/A 11/08/2015   Procedure: COLONOSCOPY;  Surgeon: Corbin Ade, MD;  Location: [facility];  Service: Endoscopy;  Laterality: N/A;   ESOPHAGOGASTRODUODENOSCOPY  2007   Dr. Ozzie Hoyle reflux esophagitis, hiatal hernia, negative H.pylori   ESOPHAGOGASTRODUODENOSCOPY N/A 11/08/2015   Procedure: ESOPHAGOGASTRODUODENOSCOPY (EGD);  Surgeon: Corbin Ade, MD;  Location: [facility];  Service: Endoscopy;  Laterality: N/A;  0830 - moved to 8:15 - office to notify   EXCISIONAL TOTAL KNEE ARTHROPLASTY WITH ANTIBIOTIC SPACERS Right 10/01/2023   Procedure: EXCISIONAL TOTAL KNEE ARTHROPLASTY WITH PLACEMENT OF ARTICULATING ANTIBIOTIC SPACER;  Surgeon: Samson Frederic, MD;  Location: [facility];  Service: Orthopedics;  Laterality: Right;   HIP PINNING,CANNULATED Left 07/12/2017   Procedure: CANNULATED HIP PINNING LEFT;  Surgeon: Samson Frederic, MD;  Location: [facility];  Service: Orthopedics;  Laterality: Left;   KNEE ARTHROPLASTY Right 04/23/2023   Procedure: COMPUTER ASSISTED TOTAL KNEE ARTHROPLASTY;  Surgeon: Samson Frederic, MD;  Location: [facility];  Service: Orthopedics;  Laterality: Right;  160    Allergies  Allergies  Allergen Reactions  Sulfasalazine Other (See Comments)    Flu like feelings   Statins Other (See Comments)    REACTION: Myalgia    Home Medications    Prior to Admission medications   Medication Sig Start Date End Date Taking? Authorizing Provider  amLODipine (NORVASC) 5 MG tablet TAKE 2 TABLETS BY MOUTH EVERY DAY 02/25/23   Lewayne Bunting, MD   ezetimibe (ZETIA) 10 MG tablet TAKE 1 TABLET BY MOUTH EVERY DAY 10/22/23   Hilty, Lisette Abu, MD  meloxicam (MOBIC) 15 MG tablet Take 15 mg by mouth daily. 07/08/22   [provider]  mometasone (ELOCON) 0.1 % cream Apply 1 Application topically daily. 11/26/23   Everrett Coombe, DO  mupirocin ointment (BACTROBAN) 2 % Apply 1 Application topically daily. 10/22/23   [provider]  olmesartan (BENICAR) 20 MG tablet TAKE 1 TABLET BY MOUTH EVERY DAY 11/12/23   Lewayne Bunting, MD  omeprazole (PRILOSEC) 40 MG capsule TAKE 1 CAPSULE (40 MG TOTAL) BY MOUTH IN THE MORNING AND AT BEDTIME. 05/12/23   Everrett Coombe, DO  rosuvastatin (CRESTOR) 20 MG tablet TAKE 1 TABLET BY MOUTH EVERY DAY 10/27/23   Lewayne Bunting, MD  venlafaxine (EFFEXOR) 37.5 MG tablet TAKE 1 TABLET BY MOUTH TWICE A DAY 12/01/23   Everrett Coombe, DO    Physical Exam    Vital Signs:  Tyler Gates does not have vital signs available for review today.  Given telephonic nature of communication, physical exam is limited. AAOx3. NAD. Normal affect.  Speech and respirations are unlabored.  Accessory Clinical Findings    None  Assessment & Plan    1.  Preoperative Cardiovascular Risk Assessment: According to the Revised Cardiac Risk Index (RCRI), his Perioperative Risk of Major Cardiac Event is (%): 0.4. His Functional Capacity in METs is: 6.36 according to the Duke Activity Status Index (DASI). Therefore, based on ACC/AHA guidelines, patient would be at acceptable risk for the planned procedure without further cardiovascular testing.   The patient was advised that if he develops new symptoms prior to surgery to contact our office to arrange for a follow-up visit, and he verbalized understanding.  A copy of this note will be routed to requesting surgeon.  Time:   Today, I have spent 8 minutes with the patient with telehealth technology discussing medical history, symptoms, and management plan.     Denyce Robert, NP  01/13/2024, 8:37 AM

## 2024-01-13 NOTE — Telephone Encounter (Signed)
Copied from CRM [id number]. Topic: General - Other >> Jan 13, 2024 10:47 AM Nila Nephew wrote: Reason for CRM: Patient calling to request that when we get his surgical clearance letter from [facility] through [facility], we give him a call to let him know we have received it, as he needs it to be completed ASAP and several have been sent but not received already.  Do you have the surgical clearance? Please contact patient

## 2024-01-19 ENCOUNTER — Encounter: Payer: Self-pay | Admitting: Gastroenterology

## 2024-01-19 ENCOUNTER — Ambulatory Visit: Payer: Medicare HMO | Admitting: Family Medicine

## 2024-01-19 DIAGNOSIS — K5010 Crohn's disease of large intestine without complications: Secondary | ICD-10-CM

## 2024-01-21 ENCOUNTER — Other Ambulatory Visit: Payer: Medicare HMO

## 2024-01-21 ENCOUNTER — Other Ambulatory Visit: Payer: Self-pay | Admitting: *Deleted

## 2024-01-21 ENCOUNTER — Telehealth: Payer: Self-pay | Admitting: Gastroenterology

## 2024-01-21 DIAGNOSIS — K5010 Crohn's disease of large intestine without complications: Secondary | ICD-10-CM

## 2024-01-21 MED ORDER — PREDNISONE 10 MG PO TABS: ORAL_TABLET | ORAL | 0 refills | Status: AC

## 2024-01-21 NOTE — Telephone Encounter (Addendum)
Spoke to [employer] with [facility] in regards to moving forward with re-induction. She indicates that best course of action would be initiate a "new therapy plan" in patient's chart. She states that he was previously approved for free drug, so additional authorization from insurance would not be needed and current 01/27/24 appointment can be kept. New therapy plan has been added to chart and previous therapy plan has been discontinued.  I have spoken to patient to advise that while we are okay with him restarting Remicade, he does need a re-induction as it has been 6 months since any previous treatment. Advised that we also need him to come for remicade antibody testing to insure that it is still safe for him to move forward with re induction.  Patient tells me that he is scheduled for another knee surgery on 02/26/24 and his surgeon is against biologics; wants surgery to be about 4 weeks between dosing. With reinduction schedule, this would not be feasible. Patient states that he cannot wait until after 02/26/24 surgery to restart remicade because he is having severe skin issues/crohns issues. States if he has to wait to start remicade until after surgery, he needs alternate treatment options.  Says he will go ahead and come to the office tomorrow for remicade antibody testing as ordered.

## 2024-01-21 NOTE — Addendum Note (Signed)
Addended by: Richardson Chiquito on: 01/21/2024 04:02 PM   Modules accepted: Orders

## 2024-01-21 NOTE — Telephone Encounter (Signed)
I have spoken to patient to advise of Dr Frankey Shown recommendation. Advised prednisone taper with eventual return to remicade induction dosing 4 weeks post op. Patient's 1st re induction remicade infusion has been moved to 03/25/24 at 9 am. Prednisone has been sent to patient's pharmacy.

## 2024-01-21 NOTE — Telephone Encounter (Signed)
See patient message dated 01/19/24 for additional details.

## 2024-01-21 NOTE — Telephone Encounter (Signed)
Patient is requesting to speak with a nurse from Chart message to discuss further.

## 2024-01-26 DIAGNOSIS — L400 Psoriasis vulgaris: Secondary | ICD-10-CM | POA: Diagnosis not present

## 2024-01-27 ENCOUNTER — Other Ambulatory Visit: Payer: Self-pay | Admitting: Cardiology

## 2024-01-27 ENCOUNTER — Ambulatory Visit: Payer: Medicare HMO

## 2024-01-27 DIAGNOSIS — E785 Hyperlipidemia, unspecified: Secondary | ICD-10-CM

## 2024-02-04 LAB — SERIAL MONITORING

## 2024-02-05 LAB — INFLIXIMAB+AB (SERIAL MONITOR): Anti-Infliximab Antibody: 53 ng/mL

## 2024-02-09 ENCOUNTER — Encounter: Payer: Self-pay | Admitting: Gastroenterology

## 2024-02-09 DIAGNOSIS — L400 Psoriasis vulgaris: Secondary | ICD-10-CM | POA: Diagnosis not present

## 2024-02-09 NOTE — Progress Notes (Signed)
 Surgery orders requested via Epic inbox.

## 2024-02-11 ENCOUNTER — Ambulatory Visit: Payer: Self-pay | Admitting: Student

## 2024-02-13 NOTE — Patient Instructions (Signed)
SURGICAL WAITING ROOM VISITATION  Patients having surgery or a procedure may have no more than 2 support people in the waiting area - these visitors may rotate.    Children under the age of 5 must have an adult with them who is not the patient.  Due to an increase in RSV and influenza rates and associated hospitalizations, children ages 91 and under may not visit patients in [facility] hospitals.  Visitors with respiratory illnesses are discouraged from visiting and should remain at home.  If the patient needs to stay at the hospital during part of their recovery, the visitor guidelines for inpatient rooms apply. Pre-op nurse will coordinate an appropriate time for 1 support person to accompany patient in pre-op.  This support person may not rotate.    Please refer to the [facility] website for the visitor guidelines for Inpatients (after your surgery is over and you are in a regular room).       Your procedure is scheduled on:  02/26/2024    Report to [facility] Main Entrance    Report to admitting at  0730AM   Call this number if you have problems the morning of surgery [phone]   Do not eat food :After Midnight.   After Midnight you may have the following liquids until __0700____ AM DAY OF SURGERY  Water Non-Citrus Juices (without pulp, NO RED-Apple, White grape, White cranberry) Black Coffee (NO MILK/CREAM OR CREAMERS, sugar ok)  Clear Tea (NO MILK/CREAM OR CREAMERS, sugar ok) regular and decaf                             Plain Jell-O (NO RED)                                           Fruit ices (not with fruit pulp, NO RED)                                     Popsicles (NO RED)                                                               Sports drinks like Gatorade (NO RED)                    The day of surgery:  Drink ONE (1) Pre-Surgery Clear Ensure or G2 at    0700 AM ( havE completed by )  the morning of surgery. Drink in one sitting. Do not sip.   This drink was given to you during your hospital  pre-op appointment visit. Nothing else to drink after completing the  Pre-Surgery Clear Ensure or G2.          If you have questions, please contact your surgeon's office.       Oral Hygiene is also important to reduce your risk of infection.  Remember - BRUSH YOUR TEETH THE MORNING OF SURGERY WITH YOUR REGULAR TOOTHPASTE  DENTURES WILL BE REMOVED PRIOR TO SURGERY PLEASE DO NOT APPLY "Poly grip" OR ADHESIVES!!!   Do NOT smoke after Midnight   Stop all vitamins and herbal supplements 7 days before surgery.   Take these medicines the morning of surgery with A SIP OF WATER:  omeprazole, prednisone, effexor   DO NOT TAKE ANY ORAL DIABETIC MEDICATIONS DAY OF YOUR SURGERY  Bring CPAP mask and tubing day of surgery.                              You may not have any metal on your body including hair pins, jewelry, and body piercing             Do not wear make-up, lotions, powders, perfumes/cologne, or deodorant  Do not wear nail polish including gel and S&S, artificial/acrylic nails, or any other type of covering on natural nails including finger and toenails. If you have artificial nails, gel coating, etc. that needs to be removed by a nail salon please have this removed prior to surgery or surgery may need to be canceled/ delayed if the surgeon/ anesthesia feels like they are unable to be safely monitored.   Do not shave  48 hours prior to surgery.               Men may shave face and neck.   Do not bring valuables to the hospital. [facility] IS NOT             RESPONSIBLE   FOR VALUABLES.   Contacts, glasses, dentures or bridgework may not be worn into surgery.   Bring small overnight bag day of surgery.   DO NOT BRING YOUR HOME MEDICATIONS TO THE HOSPITAL. PHARMACY WILL DISPENSE MEDICATIONS LISTED ON YOUR MEDICATION LIST TO YOU DURING YOUR ADMISSION IN THE HOSPITAL!    Patients discharged  on the day of surgery will not be allowed to drive home.  Someone NEEDS to stay with you for the first 24 hours after anesthesia.   Special Instructions: Bring a copy of your healthcare power of attorney and living will documents the day of surgery if you haven't scanned them before.              Please read over the following fact sheets you were given: IF YOU HAVE QUESTIONS ABOUT YOUR PRE-OP INSTRUCTIONS PLEASE CALL [phone]   If you received a COVID test during your pre-op visit  it is requested that you wear a mask when out in public, stay away from anyone that may not be feeling well and notify your surgeon if you develop symptoms. If you test positive for Covid or have been in contact with anyone that has tested positive in the last 10 days please notify you surgeon.      Pre-operative 5 CHG Bath Instructions   You can play a key role in reducing the risk of infection after surgery. Your skin needs to be as free of germs as possible. You can reduce the number of germs on your skin by washing with CHG (chlorhexidine gluconate) soap before surgery. CHG is an antiseptic soap that kills germs and continues to kill germs even after washing.   DO NOT use if you have an allergy to chlorhexidine/CHG or antibacterial soaps. If your skin becomes reddened or irritated, stop using the CHG and notify one of our  RNs at [phone].   Please shower with the CHG soap starting 4 days before surgery using the following schedule:     Please keep in mind the following:  DO NOT shave, including legs and underarms, starting the day of your first shower.   You may shave your face at any point before/day of surgery.  Place clean sheets on your bed the day you start using CHG soap. Use a clean washcloth (not used since being washed) for each shower. DO NOT sleep with pets once you start using the CHG.   CHG Shower Instructions:  If you choose to wash your hair and private area, wash first with your  normal shampoo/soap.  After you use shampoo/soap, rinse your hair and body thoroughly to remove shampoo/soap residue.  Turn the water OFF and apply about 3 tablespoons (45 ml) of CHG soap to a CLEAN washcloth.  Apply CHG soap ONLY FROM YOUR NECK DOWN TO YOUR TOES (washing for 3-5 minutes)  DO NOT use CHG soap on face, private areas, open wounds, or sores.  Pay special attention to the area where your surgery is being performed.  If you are having back surgery, having someone wash your back for you may be helpful. Wait 2 minutes after CHG soap is applied, then you may rinse off the CHG soap.  Pat dry with a clean towel  Put on clean clothes/pajamas   If you choose to wear lotion, please use ONLY the CHG-compatible lotions on the back of this paper.     Additional instructions for the day of surgery: DO NOT APPLY any lotions, deodorants, cologne, or perfumes.   Put on clean/comfortable clothes.  Brush your teeth.  Ask your nurse before applying any prescription medications to the skin.      CHG Compatible Lotions   Aveeno Moisturizing lotion  Cetaphil Moisturizing Cream  Cetaphil Moisturizing Lotion  Clairol Herbal Essence Moisturizing Lotion, Dry Skin  Clairol Herbal Essence Moisturizing Lotion, Extra Dry Skin  Clairol Herbal Essence Moisturizing Lotion, Normal Skin  Curel Age Defying Therapeutic Moisturizing Lotion with Alpha Hydroxy  Curel Extreme Care Body Lotion  Curel Soothing Hands Moisturizing Hand Lotion  Curel Therapeutic Moisturizing Cream, Fragrance-Free  Curel Therapeutic Moisturizing Lotion, Fragrance-Free  Curel Therapeutic Moisturizing Lotion, Original Formula  Eucerin Daily Replenishing Lotion  Eucerin Dry Skin Therapy Plus Alpha Hydroxy Crme  Eucerin Dry Skin Therapy Plus Alpha Hydroxy Lotion  Eucerin Original Crme  Eucerin Original Lotion  Eucerin Plus Crme Eucerin Plus Lotion  Eucerin TriLipid Replenishing Lotion  Keri Anti-Bacterial Hand Lotion  Keri  Deep Conditioning Original Lotion Dry Skin Formula Softly Scented  Keri Deep Conditioning Original Lotion, Fragrance Free Sensitive Skin Formula  Keri Lotion Fast Absorbing Fragrance Free Sensitive Skin Formula  Keri Lotion Fast Absorbing Softly Scented Dry Skin Formula  Keri Original Lotion  Keri Skin Renewal Lotion Keri Silky Smooth Lotion  Keri Silky Smooth Sensitive Skin Lotion  Nivea Body Creamy Conditioning Oil  Nivea Body Extra Enriched Teacher, adult education Moisturizing Lotion Nivea Crme  Nivea Skin Firming Lotion  NutraDerm 30 Skin Lotion  NutraDerm Skin Lotion  NutraDerm Therapeutic Skin Cream  NutraDerm Therapeutic Skin Lotion  ProShield Protective Hand Cream  Provon moisturizing lotion

## 2024-02-13 NOTE — Progress Notes (Addendum)
 Anesthesia Review:  PCP: Everrett Coombe  Clearance in Media dated 01/16/24  Cardiologist : Charna Archer, NP Telephone visit on 01/13/24  Chest x-ray : EKG : 10/02/23 and 09/22/23  Echo : 04/2022  Ct cors- 2023  Stress test: 2019  CT Card- 2019  Cardiac Cath :  Activity level: can do a flight of stairs without difficulty  Sleep Study/ CPAP : none  Fasting Blood Sugar :      / Checks Blood Sugar -- times a day:   Blood Thinner/ Instructions /Last Dose: ASA / Instructions/ Last Dose :    CXBC done 02/16/24 with white count of 12.6 rotued to DR Swinteck on 02/16/24.

## 2024-02-16 ENCOUNTER — Encounter (HOSPITAL_COMMUNITY)
Admission: RE | Admit: 2024-02-16 | Discharge: 2024-02-16 | Disposition: A | Discharge status: Home / Self Care | Payer: Medicare HMO | Source: Ambulatory Visit | Attending: Orthopedic Surgery | Admitting: Orthopedic Surgery

## 2024-02-16 ENCOUNTER — Other Ambulatory Visit: Payer: Self-pay

## 2024-02-16 ENCOUNTER — Encounter (HOSPITAL_COMMUNITY): Payer: Self-pay

## 2024-02-16 VITALS — BP 129/86 | HR 83 | Temp 97.9°F | Resp 16 | Ht 69.0 in | Wt 187.4 lb

## 2024-02-16 DIAGNOSIS — I700 Atherosclerosis of aorta: Secondary | ICD-10-CM | POA: Insufficient documentation

## 2024-02-16 DIAGNOSIS — I491 Atrial premature depolarization: Secondary | ICD-10-CM | POA: Insufficient documentation

## 2024-02-16 DIAGNOSIS — Z01818 Encounter for other preprocedural examination: Secondary | ICD-10-CM | POA: Insufficient documentation

## 2024-02-16 DIAGNOSIS — I2510 Atherosclerotic heart disease of native coronary artery without angina pectoris: Secondary | ICD-10-CM | POA: Insufficient documentation

## 2024-02-16 LAB — CBC
HCT: 45.2 % (ref 39.0–52.0)
Hemoglobin: 14.1 g/dL (ref 13.0–17.0)
MCH: 28.5 pg (ref 26.0–34.0)
MCHC: 31.2 g/dL (ref 30.0–36.0)
MCV: 91.5 fL (ref 80.0–100.0)
Platelets: 234 10*3/uL (ref 150–400)
RBC: 4.94 MIL/uL (ref 4.22–5.81)
RDW: 14.9 % (ref 11.5–15.5)
WBC: 12.6 10*3/uL — ABNORMAL HIGH (ref 4.0–10.5)
nRBC: 0 % (ref 0.0–0.2)

## 2024-02-16 LAB — BASIC METABOLIC PANEL
Anion gap: 9 (ref 5–15)
BUN: 23 mg/dL (ref 8–23)
CO2: 24 mmol/L (ref 22–32)
Calcium: 9 mg/dL (ref 8.9–10.3)
Chloride: 105 mmol/L (ref 98–111)
Creatinine, Ser: 1.15 mg/dL (ref 0.61–1.24)
GFR, Estimated: >60 mL/min (ref >60)
Glucose, Bld: 85 mg/dL (ref 70–99)
Potassium: 4.1 mmol/L (ref 3.5–5.1)
Sodium: 138 mmol/L (ref 135–145)

## 2024-02-16 LAB — SURGICAL PCR SCREEN
MRSA, PCR: NEGATIVE
Staphylococcus aureus: NEGATIVE

## 2024-02-17 NOTE — Progress Notes (Signed)
 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 PMH of HTN, HLD, mild, non-obstructive CAD (by CTA), mild OSA (no CPAP use), GERD, Crohn's disease on Remicade, depression, arthritis s/p R TKA on 04/23/2023 c/b septic joint.  He is now s/p antibiotic spacer on 09/30/23. He had PICC inserted and was started on Cefazolin per ID with end date of 11/12/23. He has had issues with autoimmune dermatitis since being off Remicade which has been managed by PCP and dermatology. Medical clearance for surgery that patient is low risk was obtained (scanned in media 01/21/24)  Patient was follows with GI for Crohn's disease. He was on Remicade but his has been paused due to ongoing issues with septic joint. Plans to restart in 03/2024.  He follows with Cardiology for non obstructive CAD. Last seen in office on 09/24/23 for pre-op clearance prior to antibiotic spacer and was cleared. He was evaluated again via tele visit on 01/13/24 and was cleared again:   "Preoperative Cardiovascular Risk Assessment: According to the Revised Cardiac Risk Index (RCRI), his Perioperative Risk of Major Cardiac Event is (%): 0.4. His Functional Capacity in METs is: 6.36 according to the Duke Activity Status Index (DASI). Therefore, based on ACC/AHA guidelines, patient would be at acceptable risk for the planned procedure without further cardiovascular testing."  VS: BP 129/86   Pulse 83   Temp 36.6 C (Oral)   Resp 16   Ht 5\' 9"  (1.753 m)   Wt 85 kg   SpO2 99%   BMI 27.67 kg/m   PROVIDERS: Everrett Coombe, DO Cardiology: Olga Millers, MD ID: Danelle Earthly,  MD  LABS: Labs reviewed: Acceptable for surgery. (all labs ordered are listed, but only abnormal results are displayed)  Labs Reviewed  CBC - Abnormal; Notable for the following components:      Result Value   WBC 12.6 (*)    All other components within normal limits  SURGICAL PCR SCREEN  BASIC METABOLIC PANEL  TYPE AND SCREEN     IMAGES:   CTA Chest 05/30/2022:   IMPRESSION: No acute findings in the imaged extracardiac chest.   Esophageal air fluid level suggests dysmotility or gastroesophageal reflux.     EKG 09/22/23:   Sinus rhythm with Premature atrial complexes Incomplete right bundle branch block Minimal voltage criteria for LVH, may be normal variant ( R in aVL )     CV:   CTA Chest 05/30/2022: IMPRESSION: 1. Coronary calcium score of 538. This was 68 percentile for age-, sex, and race-matched controls.   2. Normal coronary origin with right dominance.   3. Mild (25-49) CAD in the LAD, D1 and D2.   4. Aortic atherosclerosis.   RECOMMENDATIONS: CAD-RADS 2: Mild non-obstructive CAD (25-49%). Consider non-atherosclerotic causes of chest pain. Consider preventive therapy and risk factor modification.   Echo 05/15/2022:   IMPRESSIONS     1. Left ventricular ejection fraction, by estimation, is 55 to 60%. The  left ventricle has normal function. The left ventricle has no regional  wall motion abnormalities. There is mild asymmetric left  ventricular  hypertrophy of the septal segment. Left  ventricular diastolic parameters are consistent with Grade I diastolic  dysfunction (impaired relaxation).   2. Right ventricular systolic function is low normal. The right  ventricular size is normal. There is normal pulmonary artery systolic  pressure. The estimated right ventricular systolic pressure is 18.2 mmHg.   3. The mitral valve is grossly normal. Trivial mitral valve  regurgitation.   4. The aortic valve is tricuspid. There is mild calcification of the   aortic valve. Aortic valve regurgitation is not visualized.   5. The inferior vena cava is normal in size with greater than 50%  respiratory variability, suggesting right atrial pressure of 3 mmHg.  Past Medical History:  Diagnosis Date   Arthritis    Crohn's disease (HCC)    Depression    Dyspnea on exertion    GERD (gastroesophageal reflux disease)    Herpes    History of kidney stones    Hyperlipidemia    Hypertension    Instability of knee joint    Left displaced femoral neck fracture (HCC) 07/11/2017   OSA (obstructive sleep apnea)    mild   Snoring     Past Surgical History:  Procedure Laterality Date   COLONOSCOPY  2007   Dr. Ozzie Hoyle normal TI, ascending colon/cecum with mild inflammation noted but biopses without signs of active inflammation, hyperpastic polyp, possible ischemia on path of left colon biopsy   COLONOSCOPY N/A 11/08/2015   Procedure: COLONOSCOPY;  Surgeon: Corbin Ade, MD;  [facility];  Service: Endoscopy;  Laterality: N/A;   ESOPHAGOGASTRODUODENOSCOPY  2007   Dr. Ozzie Hoyle reflux esophagitis, hiatal hernia, negative H.pylori   ESOPHAGOGASTRODUODENOSCOPY N/A 11/08/2015   Procedure: ESOPHAGOGASTRODUODENOSCOPY (EGD);  Surgeon: Corbin Ade, MD;  [facility];  Service: Endoscopy;  Laterality: N/A;  0830 - moved to 8:15 - office to notify   EXCISIONAL TOTAL KNEE ARTHROPLASTY WITH ANTIBIOTIC SPACERS Right 10/01/2023   Procedure: EXCISIONAL TOTAL KNEE ARTHROPLASTY WITH PLACEMENT OF ARTICULATING ANTIBIOTIC SPACER;  Surgeon: Samson Frederic, MD;  Location[facility];  Service: Orthopedics;  Laterality: Right;   HIP PINNING,CANNULATED Left 07/12/2017   Procedure: CANNULATED HIP PINNING LEFT;  Surgeon: Samson Frederic, MD;  Location[facility];  Service: Orthopedics;  Laterality: Left;   KNEE ARTHROPLASTY Right 04/23/2023   Procedure: COMPUTER ASSISTED TOTAL KNEE ARTHROPLASTY;  Surgeon: Samson Frederic, MD;  Location[facility];  Service: Orthopedics;   Laterality: Right;  160    MEDICATIONS:  amLODipine (NORVASC) 5 MG tablet   ezetimibe (ZETIA) 10 MG tablet   meloxicam (MOBIC) 15 MG tablet   olmesartan (BENICAR) 20 MG tablet   omeprazole (PRILOSEC) 40 MG capsule   predniSONE (DELTASONE) 10 MG tablet   rosuvastatin (CRESTOR) 20 MG tablet   venlafaxine (EFFEXOR) 37.5 MG tablet   No current facility-administered medications for this encounter.   Marcille Blanco [facility] Short Stay/Anesthesiology [facility] Phone [phone] 02/17/2024 10:37 AM

## 2024-02-17 NOTE — Anesthesia Preprocedure Evaluation (Addendum)
 Anesthesia Evaluation    Airway        Dental   Pulmonary sleep apnea           Cardiovascular hypertension, Pt. on medications + CAD (mild, non-obstructive by CT)    '23 ECHO: EF 55 to 60%.  1. The LV has normal function, no regional wall motion abnormalities. There is mild asymmetric left ventricular hypertrophy of the septal segment. Grade I diastolic  dysfunction (impaired relaxation).   2. RVF is low normal. The right ventricular size is normal. There is normal pulmonary artery systolic pressure. The estimated right ventricular systolic pressure is 18.2 mmHg.   3. The mitral valve is grossly normal. Trivial mitral valve regurgitation.   4. The aortic valve is tricuspid. There is mild calcification of the aortic valve. Aortic valve regurgitation is not visualized.     Neuro/Psych    GI/Hepatic ,GERD  Medicated,,  Endo/Other    Renal/GU Renal InsufficiencyRenal disease     Musculoskeletal  (+) Arthritis , Osteoarthritis,    Abdominal   Peds  Hematology   Anesthesia Other Findings   Reproductive/Obstetrics                             Anesthesia Physical Anesthesia Plan  ASA:   Anesthesia Plan:    Post-op Pain Management:    Induction:   PONV Risk Score and Plan:   Airway Management Planned:   Additional Equipment:   Intra-op Plan:   Post-operative Plan:   Informed Consent:   Plan Discussed with:   Anesthesia Plan Comments: (See PAT note from 2/24 by Sherlie Ban PA-C )        Anesthesia Quick Evaluation

## 2024-02-20 ENCOUNTER — Ambulatory Visit: Payer: Self-pay | Admitting: Student

## 2024-02-20 NOTE — H&P (Signed)
 TOTAL KNEE REVISION ADMISSION H&P  Patient is being admitted for right revision total knee arthroplasty.  Subjective:  Chief Complaint:right knee pain.  HPI: Tyler Gates, 68 y.o. male, has a history of pain and functional disability in the right knee(s) due to  periprosthetic joint infection, s/p resection of right total knee arthroplasty and placement of antibiotic spacer 10/01/23.   He has completed IV antibiotics with ID, current cultures negative. Onset of symptoms was gradual starting 1 years ago with stable course since that time.  Prior procedures on the right knee(s) include arthroplasty and right TKA resection and placement of antibiotic spacer .  Patient currently rates pain in the right knee(s) at 2 out of 10 with activity.  There is no current active infection.  Patient Active Problem List   Diagnosis Date Noted   Diarrhea 10/10/2023   Prosthetic joint infection (HCC) 10/10/2023   Medication management 10/10/2023   Hypotension due to hypovolemia 10/10/2023   Infection of total right knee replacement, subsequent encounter 10/01/2023   Infection of prosthetic right knee joint (HCC) 09/22/2023   Infection of total right knee replacement (HCC) 08/27/2023   Rash 08/01/2023   Body aches 08/01/2023   Osteoarthritis of right knee 04/23/2023   Preoperative clearance 02/11/2023   Chronic sinusitis 03/12/2022   Visual disturbance 03/12/2022   Acute sinusitis 01/29/2022   Chronic kidney disease (CKD) stage G2/A2, mildly decreased glomerular filtration rate (GFR) between 60-89 mL/min/1.73 square meter and albuminuria creatinine ratio between 30-299 mg/g 10/22/2018   Hypogonadism male 10/21/2018   Screening PSA (prostate specific antigen) 10/21/2018   Chronic kidney disease due to benign hypertension 10/21/2018   Depression, major, recurrent, in remission (HCC) 10/21/2018   Statin intolerance 09/14/2018   Agatston coronary artery calcium score greater than 400 08/10/2018   Family  history of coronary artery disease 08/10/2018   Crohn's disease of colon without complication (HCC) 05/09/2017   Mucosal abnormality of esophagus    Hiatal hernia    KNEE JOINT INSTABILITY 03/07/2007   Hyperlipidemia 12/30/2006   Essential hypertension 12/30/2006   GERD 12/30/2006   Past Medical History:  Diagnosis Date   Arthritis    Crohn's disease (HCC)    Depression    Dyspnea on exertion    GERD (gastroesophageal reflux disease)    Herpes    History of kidney stones    Hyperlipidemia    Hypertension    Instability of knee joint    Left displaced femoral neck fracture (HCC) 07/11/2017   OSA (obstructive sleep apnea)    mild   Snoring     Past Surgical History:  Procedure Laterality Date   COLONOSCOPY  2007   Dr. Ozzie Hoyle normal TI, ascending colon/cecum with mild inflammation noted but biopses without signs of active inflammation, hyperpastic polyp, possible ischemia on path of left colon biopsy   COLONOSCOPY N/A 11/08/2015   Procedure: COLONOSCOPY;  Surgeon: Corbin Ade, MD;  Location: [facility];  Service: Endoscopy;  Laterality: N/A;   ESOPHAGOGASTRODUODENOSCOPY  2007   Dr. Ozzie Hoyle reflux esophagitis, hiatal hernia, negative H.pylori   ESOPHAGOGASTRODUODENOSCOPY N/A 11/08/2015   Procedure: ESOPHAGOGASTRODUODENOSCOPY (EGD);  Surgeon: Corbin Ade, MD;  Location: [facility];  Service: Endoscopy;  Laterality: N/A;  0830 - moved to 8:15 - office to notify   EXCISIONAL TOTAL KNEE ARTHROPLASTY WITH ANTIBIOTIC SPACERS Right 10/01/2023   Procedure: EXCISIONAL TOTAL KNEE ARTHROPLASTY WITH PLACEMENT OF ARTICULATING ANTIBIOTIC SPACER;  Surgeon: Samson Frederic, MD;  Location: [facility];  Service: Orthopedics;  Laterality: Right;   HIP PINNING,CANNULATED Left 07/12/2017   Procedure: CANNULATED HIP PINNING LEFT;  Surgeon: Samson Frederic, MD;  Location: [facility];  Service: Orthopedics;  Laterality: Left;   KNEE ARTHROPLASTY Right 04/23/2023   Procedure: COMPUTER ASSISTED TOTAL KNEE  ARTHROPLASTY;  Surgeon: Samson Frederic, MD;  Location: [facility];  Service: Orthopedics;  Laterality: Right;  160    Current Outpatient Medications  Medication Sig Dispense Refill Last Dose/Taking   amLODipine (NORVASC) 5 MG tablet TAKE 2 TABLETS BY MOUTH EVERY DAY (Patient taking differently: Take 5 mg by mouth every evening.) 180 tablet 0    ezetimibe (ZETIA) 10 MG tablet TAKE 1 TABLET BY MOUTH EVERY DAY 90 tablet 3    meloxicam (MOBIC) 15 MG tablet Take 15 mg by mouth in the morning.      olmesartan (BENICAR) 20 MG tablet TAKE 1 TABLET BY MOUTH EVERY DAY 90 tablet 3    omeprazole (PRILOSEC) 40 MG capsule TAKE 1 CAPSULE (40 MG TOTAL) BY MOUTH IN THE MORNING AND AT BEDTIME. 180 capsule 3    predniSONE (DELTASONE) 10 MG tablet Take 4 tablets (40 mg total) by mouth daily with breakfast for 14 days, THEN 3.5 tablets (35 mg total) daily with breakfast for 7 days, THEN 3 tablets (30 mg total) daily with breakfast for 7 days, THEN 2.5 tablets (25 mg total) daily with breakfast for 7 days, THEN 2 tablets (20 mg total) daily with breakfast for 7 days, THEN 1.5 tablets (15 mg total) daily with breakfast for 7 days, THEN 1 tablet (10 mg total) daily with breakfast for 7 days, THEN 0.5 tablets (5 mg total) daily with breakfast for 7 days. 154 tablet 0    rosuvastatin (CRESTOR) 20 MG tablet TAKE 1 TABLET BY MOUTH EVERY DAY (Patient taking differently: Take 20 mg by mouth daily with lunch.) 90 tablet 3    venlafaxine (EFFEXOR) 37.5 MG tablet TAKE 1 TABLET BY MOUTH TWICE A DAY 180 tablet 0    No current facility-administered medications for this visit.   Allergies  Allergen Reactions   Sulfasalazine Other (See Comments)    Flu like feelings   Statins Other (See Comments)    REACTION: Myalgia    Social History   Tobacco Use   Smoking status: Never   Smokeless tobacco: Never  Substance Use Topics   Alcohol use: Not Currently    Family History  Problem Relation Age of Onset   Melanoma Father    Heart  disease Father    Healthy Mother    Colon cancer Neg Hx    Esophageal cancer Neg Hx       Review of Systems  Musculoskeletal:  Positive for arthralgias, gait problem and joint swelling.  All other systems reviewed and are negative.    Objective:  Physical Exam Constitutional:      Appearance: Normal appearance.  HENT:     Head: Normocephalic and atraumatic.     Nose: Nose normal.     Mouth/Throat:     Mouth: Mucous membranes are moist.     Pharynx: Oropharynx is clear.  Eyes:     Conjunctiva/sclera: Conjunctivae normal.  Cardiovascular:     Rate and Rhythm: Normal rate and regular rhythm.     Pulses: Normal pulses.     Heart sounds: Normal heart sounds.  Pulmonary:     Effort: Pulmonary effort is normal.     Breath sounds: Normal breath sounds.  Abdominal:     General: Abdomen is flat.  Palpations: Abdomen is soft.  Genitourinary:    Comments: Deferred.  Musculoskeletal:     Cervical back: Normal range of motion and neck supple.     Comments: Examination of the right knee reveals well healed incision. No drainage or erythema. No wound dehiscence. Rash lateral to incision cleared. He can perform a straight leg raise.   Rash over left flank cleared. Eruptions over toes cleared. Rash on upper thigh at groin cleared.   Sensory and motor function intact in LE bilaterally. Distal pedal pulses 2+ bilaterally.  Mild expected pedal edema. Calves soft and non-tender.  Skin:    General: Skin is warm and dry.     Capillary Refill: Capillary refill takes less than 2 seconds.  Neurological:     General: No focal deficit present.     Mental Status: He is alert and oriented to person, place, and time.  Psychiatric:        Mood and Affect: Mood normal.        Behavior: Behavior normal.        Thought Content: Thought content normal.        Judgment: Judgment normal.     Vital signs in last 24 hours: @VSRANGES @  Labs:  Estimated body mass index is 27.67 kg/m as  calculated from the following:   Height as of 02/16/24: 5\' 9"  (1.753 m).   Weight as of 02/16/24: 85 kg.  Imaging Review Plain radiographs demonstrate right knee articulating antibiotic spacer. The overall alignment is neutral. The bone quality appears to be adequate for age and reported activity level.    Assessment/Plan:  End stage arthritis, right knee(s) with failed previous arthroplasty.   The patient history, physical examination, clinical judgment of the provider and imaging studies are consistent with end stage degenerative joint disease of the right knee(s), previous total knee arthroplasty. Revision total knee arthroplasty is deemed medically necessary. The treatment options including medical management, injection therapy, arthroscopy and revision arthroplasty were discussed at length. The risks and benefits of revision total knee arthroplasty were presented and reviewed. The risks due to aseptic loosening, infection, stiffness, patella tracking problems, thromboembolic complications and other imponderables were discussed. The patient acknowledged the explanation, agreed to proceed with the plan and consent was signed. Patient is being admitted for inpatient treatment for surgery, pain control, PT, OT, prophylactic antibiotics, VTE prophylaxis, progressive ambulation and ADL's and discharge planning.The patient is planning to be discharged home with OPPT after an overnight stay.   Therapy Plans: outpatient physical therapy.  Disposition: Home with wife.  Planned DVT Prophylaxis: aspirin 81mg  BID.  DME needed: Has rolling walker. Has ice machine.  PCP: Cleared.  Cardiology: Cleared.  Rheumatology: Cleared.  TXA: IV Allergies:  - Sulfa antibiotics - aches and pains.  - Sulfasalazine - dizziness - Statins - diarrhea.  Anesthesia Concerns: None.  BMI: 27.5. Last HgbA1c: Not diabetic.  Other: - Crohns disease, Remicade infusions last dose in July, 2024. He will hold Remicade until  he has adequate wound healing and completion of IV antibiotics. Not currently on abx. On prednisone x1 month to clear up rash. Also using clobetasol. Discussed to stop the prednisone 02/21/24, and clobetasol today.  - Patient will let us know if rash appears postoperative and we will restart prednisone. Patient aware not to use topical steroids postoperative.  - Patient agrees to take levaquin once daily postoperative. - Oxycodone, zofran, has meloxicam. - 02/16/24: Hgb 14.1, K+ 4.1, Cr. 1.15.

## 2024-02-26 ENCOUNTER — Encounter (HOSPITAL_COMMUNITY): Payer: Self-pay | Admitting: Orthopedic Surgery

## 2024-02-26 ENCOUNTER — Encounter (HOSPITAL_COMMUNITY)
Admission: RE | Disposition: A | Discharge status: Home / Self Care | Payer: Self-pay | Source: Home / Self Care | Attending: Orthopedic Surgery

## 2024-02-26 ENCOUNTER — Ambulatory Visit (HOSPITAL_COMMUNITY)
Admission: RE | Admit: 2024-02-26 | Discharge: 2024-02-26 | Disposition: A | Discharge status: Home / Self Care | Payer: Medicare HMO | Attending: Orthopedic Surgery | Admitting: Orthopedic Surgery

## 2024-02-26 ENCOUNTER — Encounter (HOSPITAL_COMMUNITY): Payer: Self-pay | Admitting: Certified Registered"

## 2024-02-26 ENCOUNTER — Encounter (HOSPITAL_COMMUNITY): Payer: Self-pay | Admitting: Medical

## 2024-02-26 DIAGNOSIS — Z5309 Procedure and treatment not carried out because of other contraindication: Secondary | ICD-10-CM | POA: Insufficient documentation

## 2024-02-26 DIAGNOSIS — Z01818 Encounter for other preprocedural examination: Secondary | ICD-10-CM | POA: Diagnosis not present

## 2024-02-26 DIAGNOSIS — T8453XA Infection and inflammatory reaction due to internal right knee prosthesis, initial encounter: Secondary | ICD-10-CM | POA: Insufficient documentation

## 2024-02-26 DIAGNOSIS — M3589 Other specified systemic involvement of connective tissue: Secondary | ICD-10-CM | POA: Insufficient documentation

## 2024-02-26 LAB — TYPE AND SCREEN
ABO/RH(D): O POS
Antibody Screen: NEGATIVE

## 2024-02-26 SURGERY — REMOVAL, SPACER, KNEE
Anesthesia: Spinal | Site: Knee | Laterality: Right

## 2024-02-26 MED ORDER — LACTATED RINGERS IV SOLN: INTRAVENOUS | Status: DC

## 2024-02-26 MED ORDER — TRANEXAMIC ACID-NACL 1000-0.7 MG/100ML-% IV SOLN
1000.0000 mg | INTRAVENOUS | Status: DC
  Filled 2024-02-26: qty 100

## 2024-02-26 MED ORDER — FENTANYL CITRATE PF 50 MCG/ML IJ SOSY: 50.0000 ug | PREFILLED_SYRINGE | INTRAMUSCULAR | Status: DC

## 2024-02-26 MED ORDER — POVIDONE-IODINE 10 % EX SWAB: 2.0000 | Freq: Once | CUTANEOUS | Status: DC

## 2024-02-26 MED ORDER — ORAL CARE MOUTH RINSE: 15.0000 mL | Freq: Once | OROMUCOSAL | Status: AC

## 2024-02-26 MED ORDER — ACETAMINOPHEN 500 MG PO TABS
1000.0000 mg | ORAL_TABLET | Freq: Once | ORAL | Status: AC
  Administered 2024-02-26: 1000 mg via ORAL
  Filled 2024-02-26: qty 2

## 2024-02-26 MED ORDER — CHLORHEXIDINE GLUCONATE 0.12 % MT SOLN
15.0000 mL | Freq: Once | OROMUCOSAL | Status: AC
  Administered 2024-02-26: 15 mL via OROMUCOSAL

## 2024-02-26 MED ORDER — VANCOMYCIN HCL IN DEXTROSE 1-5 GM/200ML-% IV SOLN
1000.0000 mg | Freq: Once | INTRAVENOUS | Status: DC
  Filled 2024-02-26: qty 200

## 2024-02-26 MED ORDER — CEFAZOLIN SODIUM-DEXTROSE 2-4 GM/100ML-% IV SOLN
2.0000 g | INTRAVENOUS | Status: DC
  Filled 2024-02-26: qty 100

## 2024-02-26 MED ORDER — MIDAZOLAM HCL 2 MG/2ML IJ SOLN
1.0000 mg | INTRAMUSCULAR | Status: DC
Start: 2024-02-26 — End: 2024-02-26

## 2024-02-26 NOTE — Progress Notes (Signed)
 Patient has flareup of autoimmune rash on R knee, RUE, and trunk since stopping prednisone last week. Surgery cancelled due to infection risk. Will resume prednisone 20mg  follow up in the office 2-3 weeks.

## 2024-02-27 ENCOUNTER — Other Ambulatory Visit: Payer: Self-pay | Admitting: Family Medicine

## 2024-02-27 DIAGNOSIS — F3340 Major depressive disorder, recurrent, in remission, unspecified: Secondary | ICD-10-CM

## 2024-03-08 ENCOUNTER — Ambulatory Visit (INDEPENDENT_AMBULATORY_CARE_PROVIDER_SITE_OTHER)

## 2024-03-08 VITALS — BP 124/90 | HR 91 | Temp 98.0°F | Resp 18 | Ht 69.0 in | Wt 191.0 lb

## 2024-03-08 DIAGNOSIS — K5010 Crohn's disease of large intestine without complications: Secondary | ICD-10-CM

## 2024-03-08 MED ORDER — METHYLPREDNISOLONE SODIUM SUCC 40 MG IJ SOLR
40.0000 mg | Freq: Once | INTRAMUSCULAR | Status: AC
  Administered 2024-03-08: 40 mg via INTRAVENOUS
  Filled 2024-03-08: qty 1

## 2024-03-08 MED ORDER — INFLIXIMAB 100 MG IV SOLR
5.0000 mg/kg | Freq: Once | INTRAVENOUS | Status: AC
  Administered 2024-03-08: 400 mg via INTRAVENOUS
  Filled 2024-03-08: qty 40

## 2024-03-08 MED ORDER — ACETAMINOPHEN 325 MG PO TABS
650.0000 mg | ORAL_TABLET | Freq: Once | ORAL | Status: AC
  Administered 2024-03-08: 650 mg via ORAL
  Filled 2024-03-08: qty 2

## 2024-03-08 MED ORDER — DIPHENHYDRAMINE HCL 25 MG PO CAPS: 25.0000 mg | ORAL_CAPSULE | Freq: Once | ORAL | Status: DC

## 2024-03-08 NOTE — Progress Notes (Signed)
 Diagnosis: Crohn's Disease  Provider:  Chilton Greathouse MD  Procedure: IV Infusion  IV Type: Peripheral, IV Location: L Hand  Remicade (Infliximab), Dose: 400 mg  Infusion Start Time: 1405  Infusion Stop Time: 1616  Post Infusion IV Care: Patient declined observation and Peripheral IV Discontinued  Discharge: Condition: Good, Destination: Home . AVS Declined  Performed by:  Loney Hering, LPN

## 2024-03-20 ENCOUNTER — Other Ambulatory Visit: Payer: Self-pay | Admitting: Gastroenterology

## 2024-03-23 ENCOUNTER — Ambulatory Visit (INDEPENDENT_AMBULATORY_CARE_PROVIDER_SITE_OTHER)

## 2024-03-23 VITALS — BP 136/93 | HR 72 | Temp 98.1°F | Resp 16 | Ht 69.0 in | Wt 191.0 lb

## 2024-03-23 DIAGNOSIS — K5010 Crohn's disease of large intestine without complications: Secondary | ICD-10-CM

## 2024-03-23 MED ORDER — ACETAMINOPHEN 325 MG PO TABS
650.0000 mg | ORAL_TABLET | Freq: Once | ORAL | Status: AC
  Administered 2024-03-23: 650 mg via ORAL
  Filled 2024-03-23: qty 2

## 2024-03-23 MED ORDER — METHYLPREDNISOLONE SODIUM SUCC 40 MG IJ SOLR
40.0000 mg | Freq: Once | INTRAMUSCULAR | Status: AC
  Administered 2024-03-23: 40 mg via INTRAVENOUS
  Filled 2024-03-23: qty 1

## 2024-03-23 MED ORDER — SODIUM CHLORIDE 0.9 % IV SOLN
5.0000 mg/kg | Freq: Once | INTRAVENOUS | Status: AC
  Administered 2024-03-23: 400 mg via INTRAVENOUS
  Filled 2024-03-23: qty 40

## 2024-03-23 MED ORDER — DIPHENHYDRAMINE HCL 25 MG PO CAPS: 25.0000 mg | ORAL_CAPSULE | Freq: Once | ORAL | Status: DC

## 2024-03-23 NOTE — Progress Notes (Signed)
 Diagnosis: Crohn's Disease  Provider:  Chilton Greathouse MD  Procedure: IV Infusion  IV Type: Peripheral, IV Location: L Hand  Remicade (Infliximab), Dose: 400 mg  Infusion Start Time: 0935  Infusion Stop Time: 1147  Post Infusion IV Care: Peripheral IV Discontinued  Discharge: Condition: Good, Destination: Home . AVS Declined  Performed by:  Loney Hering, LPN

## 2024-03-25 ENCOUNTER — Ambulatory Visit: Payer: Medicare HMO

## 2024-04-20 ENCOUNTER — Ambulatory Visit (INDEPENDENT_AMBULATORY_CARE_PROVIDER_SITE_OTHER)

## 2024-04-20 VITALS — BP 142/88 | HR 97 | Temp 98.0°F | Resp 16 | Ht 69.0 in | Wt 195.8 lb

## 2024-04-20 DIAGNOSIS — K5010 Crohn's disease of large intestine without complications: Secondary | ICD-10-CM

## 2024-04-20 MED ORDER — SODIUM CHLORIDE 0.9 % IV SOLN
5.0000 mg/kg | Freq: Once | INTRAVENOUS | Status: AC
  Administered 2024-04-20: 400 mg via INTRAVENOUS
  Filled 2024-04-20: qty 40

## 2024-04-20 MED ORDER — DIPHENHYDRAMINE HCL 25 MG PO CAPS: 25.0000 mg | ORAL_CAPSULE | Freq: Once | ORAL | Status: DC

## 2024-04-20 MED ORDER — METHYLPREDNISOLONE SODIUM SUCC 40 MG IJ SOLR
40.0000 mg | Freq: Once | INTRAMUSCULAR | Status: AC
  Administered 2024-04-20: 40 mg via INTRAVENOUS
  Filled 2024-04-20: qty 1

## 2024-04-20 MED ORDER — ACETAMINOPHEN 325 MG PO TABS
650.0000 mg | ORAL_TABLET | Freq: Once | ORAL | Status: AC
  Administered 2024-04-20: 650 mg via ORAL
  Filled 2024-04-20: qty 2

## 2024-04-20 NOTE — Progress Notes (Signed)
 Diagnosis: Crohn's Disease  Provider:  Praveen Mannam MD  Procedure: IV Infusion  IV Type: Peripheral, IV Location: L Antecubital  Remicade  (Infliximab ), Dose: 400 mg  Infusion Start Time: 1004  Infusion Stop Time: 1220  Post Infusion IV Care: Peripheral IV Discontinued  Discharge: Condition: Good, Destination: Home . AVS Declined  Performed by:  Lauran Pollard, LPN

## 2024-05-09 ENCOUNTER — Other Ambulatory Visit: Payer: Self-pay | Admitting: Family Medicine

## 2024-05-25 ENCOUNTER — Other Ambulatory Visit: Payer: Self-pay | Admitting: Family Medicine

## 2024-05-25 DIAGNOSIS — F3340 Major depressive disorder, recurrent, in remission, unspecified: Secondary | ICD-10-CM

## 2024-05-26 ENCOUNTER — Ambulatory Visit: Admitting: Gastroenterology

## 2024-05-26 ENCOUNTER — Encounter: Payer: Self-pay | Admitting: Gastroenterology

## 2024-05-26 VITALS — BP 122/82 | HR 91 | Ht 69.0 in | Wt 197.0 lb

## 2024-05-26 DIAGNOSIS — K5010 Crohn's disease of large intestine without complications: Secondary | ICD-10-CM | POA: Diagnosis not present

## 2024-05-26 DIAGNOSIS — K219 Gastro-esophageal reflux disease without esophagitis: Secondary | ICD-10-CM

## 2024-05-26 NOTE — Patient Instructions (Addendum)
 _______________________________________________________  If your blood pressure at your visit was 140/90 or greater, please contact your primary care physician to follow up on this. _______________________________________________________  If you are age 69 or older, your body mass index should be between 23-30. Your Body mass index is 29.09 kg/m. If this is out of the aforementioned range listed, please consider follow up with your Primary Care Provider.  If you are age 50 or younger, your body mass index should be between 19-25. Your Body mass index is 29.09 kg/m. If this is out of the aformentioned range listed, please consider follow up with your Primary Care Provider.  ________________________________________________________  Tyler Gates will need lab work in October 2025. We will contact you to remind you of lab work.  The [facility] providers would like to encourage you to use MYCHART to communicate with providers for non-urgent requests or questions.  Due to long hold times on the telephone, sending your provider a message by [facility] may be a faster and more efficient way to get a response.  Please allow 48 business hours for a response.  Please remember that this is for non-urgent requests.  _______________________________________________________  It was a pleasure to see you today!  Vito Cirigliano, D.O.

## 2024-05-26 NOTE — Progress Notes (Signed)
 Chief Complaint:    Crohn's disease  GI History: 68 year old male with a history of CKD (creatinine ~1.3), GERD, HTN, HLD, follows in the GI clinic for Crohn's Colitis and GERD.   1) Crohn's Disease: longstanding history of steroid responsive Crohn's colitis, previously followed by Dr. Riley Cheadle at [facility]. Was treated with budesonide  in 10/2019. Restarted Lialda  4.8 g/day in 03/2020 for breakthrough symptoms with good clinical response.  Had breakthrough when weaning to 2.4 g/day.  Responded with slight increase to 3.6 g/day.  Active colitis on colonoscopy in 05/2020, but patient preferred continued Lialda  rather than escalation of therapy.  Had issues obtaining Lialda  due to insurance.  Breakthrough with Apriso  and Entocort.  Good response to repeat trial of Uceris , but no durable relief.  Finally able to get back on Lialda  in 07/2021.  Continued breakthrough symptoms despite high-dose Lialda  and eventually transition to Remicade  in 04/2022 (Humira  was unaffordable and did not receive assistance through [employer] assist). - ESR/CRP not reliably elevated during flare - Fecal calprotectin will elevate with flares - Has a sulfa allergy-cannot do sulfasalazine  - Remicade  held in 06/2023 due to right TKA with subsequent infection, and reinitiated therapy in 02/2024.   He is an avid Gaffer.     IBD History:  Diagnosed in his 20's Index symptoms: Abdominal cramping, loose, nonbloody stools. No UGI sxs. No nocturnal stools or tenesmus.    Evaluation to date: - TPMT: N/A - TB testing: Negative QuantiFERON gold - HBV status: Negative.  Completed hepatitis A/B vaccine series - Pertinent Imaging: None for review - Last colonoscopy:  05/2020 - Small bowel imaging: None - History of EIMs: None, although does describe some vague skin rash on back that improves with steroids.  Has seen Dermatology   Current medication: Remicade  Previous medications: Budesonide  ER, Pentasa , Apriso , Lialda ,  Azulfidine , prednisone .     Health Maintenance: - DEXA: Declined - Vaccinations:      - Annual Flu Vaccine -UTD      - Pneumococcal Vaccine if receiving immunosuppression: Prevnar 20 with PCM      - Zoster vaccine if over age 86: Obtained through [facility] - Micronutrient eval:      - Annual Vit D, B6, iron panel: Up-to-date      - Ileal disease: B12, fat soluble vitamins: Up-to-date - Surveillance colonoscopy: Will discuss again at follow-up given all of his other more acute pressing medical issues - Surveillance labs for immunomodulators: N/A - Annual depression screening: None - Annual Dermatology/Skin exam: Has seen previously.  Appointment scheduled for next week   Endoscopic Hx: -Colonoscopy (05/2020, Dr. Karene Oto): Moderate, active colitis (erythema, edema, aphthous ulcers) from transverse colon to cecum.  Mild inflammation in the left colon in a patchy distribution with rectal sparing.  Sigmoid diverticulosis.  Internal hemorrhoids.  Normal TI.  Benign rectal hyperplastic polyps -Colonoscopy (10/2015, Dr. Riley Cheadle): Mild inflammatory changes, mild to moderate chronic colitis predominantly on the right sided endoscopically, for biopsies throughout colon with Crohn's disease, aside from rectal sparing.  Normal TI -EGD (02/01/2015, Dr. Riley Cheadle): 1 cm salmon-colored tongue x2 (biopsy: Gastritis), patulous EG junction with 2-3 cm HH, normal stomach and duodenum -Colonoscopy (2007, Dr. Bettie Bruce): Normal TI, ascending colon/cecum mild inflammation but biopsies without active inflammation, hyperplastic polyp, possible ischemia on path left colon biopsy   2) GERD: History of GERD and hiatal hernia.  Reflux symptoms well controlled on Nexium  40 mg BID.  Was previously treated with omeprazole  40 mg bid.  HPI:     Patient  is a 68 y.o. male presenting to the Gastroenterology Clinic for follow-up.  Was last seen by me on 11/28/2023.  Was doing well from a GI standpoint at that time while off IBD therapy due to  perioperative issues with his right TKA hardware.  Start developing Crohn's symptoms again in January, but needed to postpone reinduction of Remicade  due to planned repeat knee surgery on 02/26/2024, so he was treated with prednisone  in the interim with plan to reinitiate Remicade  4 weeks postop.  Unfortunately, knee surgery was then canceled due to skin infection and reinitiated Remicade  on 03/08/2024.  Today, he states he is feeling better since completing loading phase of Remicade .  Next dose scheduled for 06/15/2024 which is start of maintenance therapy.  Currently without any GI symptoms.  Previous skin rash has since cleared up since restarting Remicade .  For now, he is planning on keeping his right knee spacer in place without plan for repeat TKR for the time being.  Review of systems:     No chest pain, no SOB, no fevers, no urinary sx   Past Medical History:  Diagnosis Date   Arthritis    Crohn's disease (HCC)    Depression    Dyspnea on exertion    GERD (gastroesophageal reflux disease)    Herpes    History of kidney stones    Hyperlipidemia    Hypertension    Instability of knee joint    Left displaced femoral neck fracture (HCC) 07/11/2017   OSA (obstructive sleep apnea)    mild   Snoring     Patient's surgical history, family medical history, social history, medications and allergies were all reviewed in Epic    Current Outpatient Medications  Medication Sig Dispense Refill   amLODipine  (NORVASC ) 5 MG tablet TAKE 2 TABLETS BY MOUTH EVERY DAY (Patient taking differently: Take 5 mg by mouth every evening.) 180 tablet 0   ezetimibe  (ZETIA ) 10 MG tablet TAKE 1 TABLET BY MOUTH EVERY DAY 90 tablet 3   meloxicam (MOBIC) 15 MG tablet Take 15 mg by mouth in the morning.     olmesartan  (BENICAR ) 20 MG tablet TAKE 1 TABLET BY MOUTH EVERY DAY 90 tablet 3   omeprazole  (PRILOSEC) 40 MG capsule TAKE 1 CAPSULE (40 MG TOTAL) BY MOUTH IN THE MORNING AND AT BEDTIME. 180 capsule 3    rosuvastatin  (CRESTOR ) 20 MG tablet TAKE 1 TABLET BY MOUTH EVERY DAY (Patient taking differently: Take 20 mg by mouth daily with lunch.) 90 tablet 3   venlafaxine  (EFFEXOR ) 37.5 MG tablet TAKE 1 TABLET BY MOUTH TWICE A DAY 180 tablet 0   No current facility-administered medications for this visit.    Physical Exam:     BP 122/82   Pulse 91   Ht 5\' 9"  (1.753 m)   Wt 197 lb (89.4 kg)   BMI 29.09 kg/m   GENERAL:  Pleasant male in NAD PSYCH: : Cooperative, normal affect NEURO: Alert and oriented x 3   IMPRESSION and PLAN:    1) Crohn's colitis 68 year old male with longstanding history of steroid responsive Crohn's colitis.  Previously responsive to Lialda , but eventually lost efficacy, and transitioned to Remicade  in 04/2022 with good response.   Remicade  stopped in summer 2024 due to infected right TKA hardware.  This was restarted earlier this year and just completed reload with first maintenance dose due later this month.  Again with excellent response to Remicade  and currently without any GI symptoms.  - Continue Remicade  as scheduled -  Plan to recheck Remicade  trough and antibody level 1 week prior to October dose for continued proactive monitoring - DEXA scan in about 6 months given recent steroid courses - UTD on labs - Continue regular Dermatology follow-up  2) GERD - Well-controlled on current therapy  RTC in 1 year or sooner prn  I spent 35 minutes of time, including in depth chart review, independent review of results as outlined above, communicating results with the patient directly, face-to-face time with the patient, coordinating care, and ordering studies and medications as appropriate, and documentation.           Annis Kinder ,DO, FACG 05/26/2024, 9:34 AM

## 2024-06-15 ENCOUNTER — Ambulatory Visit

## 2024-06-17 ENCOUNTER — Ambulatory Visit (INDEPENDENT_AMBULATORY_CARE_PROVIDER_SITE_OTHER)

## 2024-06-17 VITALS — BP 129/88 | HR 77 | Temp 98.0°F | Resp 18 | Ht 68.0 in | Wt 194.6 lb

## 2024-06-17 DIAGNOSIS — K5010 Crohn's disease of large intestine without complications: Secondary | ICD-10-CM

## 2024-06-17 MED ORDER — SODIUM CHLORIDE 0.9 % IV SOLN
5.0000 mg/kg | Freq: Once | INTRAVENOUS | Status: AC
  Administered 2024-06-17: 400 mg via INTRAVENOUS
  Filled 2024-06-17: qty 40

## 2024-06-17 MED ORDER — DIPHENHYDRAMINE HCL 25 MG PO CAPS: 25.0000 mg | ORAL_CAPSULE | Freq: Once | ORAL | Status: DC

## 2024-06-17 MED ORDER — METHYLPREDNISOLONE SODIUM SUCC 40 MG IJ SOLR
40.0000 mg | Freq: Once | INTRAMUSCULAR | Status: AC
  Administered 2024-06-17: 40 mg via INTRAVENOUS
  Filled 2024-06-17: qty 1

## 2024-06-17 MED ORDER — ACETAMINOPHEN 325 MG PO TABS
650.0000 mg | ORAL_TABLET | Freq: Once | ORAL | Status: AC
  Administered 2024-06-17: 650 mg via ORAL
  Filled 2024-06-17: qty 2

## 2024-06-17 NOTE — Progress Notes (Signed)
 Diagnosis: Crohn's Disease  Provider:  Praveen Mannam MD  Procedure: IV Infusion  IV Type: Peripheral, IV Location: L Hand  Remicade  (Infliximab ), Dose: 400 mg  Infusion Start Time: 1455  Infusion Stop Time: 1706  Post Infusion IV Care: Peripheral IV Discontinued  Discharge: Condition: Good, Destination: Home . AVS Declined  Performed by:  Leita FORBES Miles, LPN

## 2024-06-21 ENCOUNTER — Encounter: Payer: Self-pay | Admitting: Cardiology

## 2024-06-21 MED ORDER — AMLODIPINE BESYLATE 5 MG PO TABS: 10.0000 mg | ORAL_TABLET | Freq: Every day | ORAL | 0 refills | Status: DC

## 2024-07-09 ENCOUNTER — Encounter: Payer: Self-pay | Admitting: Advanced Practice Midwife

## 2024-08-12 ENCOUNTER — Ambulatory Visit

## 2024-08-12 VITALS — BP 134/87 | HR 78 | Temp 97.9°F | Resp 18 | Ht 69.0 in | Wt 193.4 lb

## 2024-08-12 DIAGNOSIS — K5010 Crohn's disease of large intestine without complications: Secondary | ICD-10-CM

## 2024-08-12 MED ORDER — METHYLPREDNISOLONE SODIUM SUCC 40 MG IJ SOLR
40.0000 mg | Freq: Once | INTRAMUSCULAR | Status: AC
Start: 2024-08-12 — End: 2024-08-12
  Administered 2024-08-12: 40 mg via INTRAVENOUS
  Filled 2024-08-12: qty 1

## 2024-08-12 MED ORDER — ACETAMINOPHEN 325 MG PO TABS
650.0000 mg | ORAL_TABLET | Freq: Once | ORAL | Status: AC
Start: 2024-08-12 — End: 2024-08-12
  Administered 2024-08-12: 650 mg via ORAL
  Filled 2024-08-12: qty 2

## 2024-08-12 MED ORDER — SODIUM CHLORIDE 0.9 % IV SOLN
5.0000 mg/kg | Freq: Once | INTRAVENOUS | Status: AC
  Administered 2024-08-12: 400 mg via INTRAVENOUS
  Filled 2024-08-12: qty 40

## 2024-08-12 MED ORDER — DIPHENHYDRAMINE HCL 25 MG PO CAPS: 25.0000 mg | ORAL_CAPSULE | Freq: Once | ORAL | Status: DC

## 2024-08-12 NOTE — Progress Notes (Signed)
 Diagnosis: Crohn's Disease  Provider:  Praveen Mannam MD  Procedure: IV Infusion  IV Type: Peripheral, IV Location: R Hand  Remicade  (Infliximab ), Dose: 400 mg  Infusion Start Time: 0936  Infusion Stop Time: 1205  Post Infusion IV Care: Peripheral IV Discontinued  Discharge: Condition: Good, Destination: Home . AVS Declined  Performed by:  Janelys Glassner, RN

## 2024-08-19 ENCOUNTER — Ambulatory Visit

## 2024-08-19 VITALS — Ht 69.0 in | Wt 190.0 lb

## 2024-08-19 DIAGNOSIS — Z0000 Encounter for general adult medical examination without abnormal findings: Secondary | ICD-10-CM

## 2024-08-19 NOTE — Progress Notes (Signed)
 Subjective:   Tyler Gates is a 68 y.o. male who presents for Medicare Annual/Subsequent preventive examination.  Visit Complete: Virtual I connected with  Tyler Gates on 08/19/24 by a audio enabled telemedicine application and verified that I am speaking with the correct person using two identifiers.  Patient Location: Home  Provider Location: Office/Clinic  I discussed the limitations of evaluation and management by telemedicine. The patient expressed understanding and agreed to proceed.  Vital Signs: Because this visit was a virtual/telehealth visit, some criteria may be missing or patient reported. Any vitals not documented were not able to be obtained and vitals that have been documented are patient reported.  Patient Medicare AWV questionnaire was completed by the patient on n/a; I have confirmed that all information answered by patient is correct and no changes since this date.  Cardiac Risk Factors include: advanced age (>82men, >92 women);male gender;dyslipidemia;hypertension;family history of premature cardiovascular disease     Objective:    Today's Vitals   08/19/24 0807  Weight: 190 lb (86.2 kg)  Height: 5' 9 (1.753 m)   Body mass index is 28.06 kg/m.     08/19/2024    8:15 AM 03/08/2024    1:19 PM 02/16/2024   11:16 AM 10/01/2023    6:02 PM 09/22/2023   10:08 AM 08/12/2023    8:09 AM 04/28/2023    9:34 AM  Advanced Directives  Does Patient Have a Medical Advance Directive? Yes Yes Yes Yes Yes Yes Yes  Type of Estate agent of [place];Living will Healthcare Power of [place];Living will Healthcare Power of [place];Living will Healthcare Power of [employer] Power of Attorney Living will Healthcare Power of [place];Living will  Does patient want to make changes to medical advance directive? No - Patient declined   No - Patient declined  No - Patient declined   Copy of Healthcare Power of Attorney in Chart?       Yes - validated  most recent copy scanned in chart (See row information)    Current Medications (verified) Outpatient Encounter Medications as of 08/19/2024  Medication Sig   amLODipine  (NORVASC ) 5 MG tablet Take 2 tablets (10 mg total) by mouth daily.   ezetimibe  (ZETIA ) 10 MG tablet TAKE 1 TABLET BY MOUTH EVERY DAY   meloxicam (MOBIC) 15 MG tablet Take 15 mg by mouth in the morning.   olmesartan  (BENICAR ) 20 MG tablet TAKE 1 TABLET BY MOUTH EVERY DAY   omeprazole  (PRILOSEC) 40 MG capsule TAKE 1 CAPSULE (40 MG TOTAL) BY MOUTH IN THE MORNING AND AT BEDTIME.   rosuvastatin  (CRESTOR ) 20 MG tablet TAKE 1 TABLET BY MOUTH EVERY DAY   venlafaxine  (EFFEXOR ) 37.5 MG tablet TAKE 1 TABLET BY MOUTH TWICE A DAY   No facility-administered encounter medications on file as of 08/19/2024.    Allergies (verified) Sulfasalazine  and Statins   History: Past Medical History:  Diagnosis Date   Arthritis    Crohn's disease (HCC)    Depression    Dyspnea on exertion    GERD (gastroesophageal reflux disease)    Herpes    History of kidney stones    Hyperlipidemia    Hypertension    Instability of knee joint    Left displaced femoral neck fracture (HCC) 07/11/2017   OSA (obstructive sleep apnea)    mild   Snoring    Past Surgical History:  Procedure Laterality Date   COLONOSCOPY  2007   Dr. Zena normal TI, ascending colon/cecum with mild inflammation noted  but biopses without signs of active inflammation, hyperpastic polyp, possible ischemia on path of left colon biopsy   COLONOSCOPY N/A 11/08/2015   Procedure: COLONOSCOPY;  Surgeon: Lamar CHRISTELLA Hollingshead, MD;  Location: [facility];  Service: Endoscopy;  Laterality: N/A;   ESOPHAGOGASTRODUODENOSCOPY  2007   Dr. Zena reflux esophagitis, hiatal hernia, negative H.pylori   ESOPHAGOGASTRODUODENOSCOPY N/A 11/08/2015   Procedure: ESOPHAGOGASTRODUODENOSCOPY (EGD);  Surgeon: Lamar CHRISTELLA Hollingshead, MD;  Location: [facility];  Service: Endoscopy;  Laterality: N/A;  0830 - moved to  8:15 - office to notify   EXCISIONAL TOTAL KNEE ARTHROPLASTY WITH ANTIBIOTIC SPACERS Right 10/01/2023   Procedure: EXCISIONAL TOTAL KNEE ARTHROPLASTY WITH PLACEMENT OF ARTICULATING ANTIBIOTIC SPACER;  Surgeon: Fidel Rogue, MD;  Location: [facility];  Service: Orthopedics;  Laterality: Right;   HIP PINNING,CANNULATED Left 07/12/2017   Procedure: CANNULATED HIP PINNING LEFT;  Surgeon: Fidel Rogue, MD;  Location: [facility];  Service: Orthopedics;  Laterality: Left;   KNEE ARTHROPLASTY Right 04/23/2023   Procedure: COMPUTER ASSISTED TOTAL KNEE ARTHROPLASTY;  Surgeon: Fidel Rogue, MD;  Location: [facility];  Service: Orthopedics;  Laterality: Right;  160   Family History  Problem Relation Age of Onset   Melanoma Father    Heart disease Father    Healthy Mother    Colon cancer Neg Hx    Esophageal cancer Neg Hx    Social History   Socioeconomic History   Marital status: Married    Spouse name: Wesson Stith   Number of children: 2   Years of education: 14   Highest education level: Some college, no degree  Occupational History   Occupation: Retired  Tobacco Use   Smoking status: Never   Smokeless tobacco: Never  Vaping Use   Vaping status: Never Used  Substance and Sexual Activity   Alcohol  use: Not Currently   Drug use: No   Sexual activity: Not Currently    Birth control/protection: None  Other Topics Concern   Not on file  Social History Narrative   Lives with wife. He has two children. He enjoys cycling, kayaking and working on model cars.   Social Drivers of Corporate investment banker Strain: Low Risk  (08/19/2024)   Overall Financial Resource Strain (CARDIA)    Difficulty of Paying Living Expenses: Not hard at all  Food Insecurity: No Food Insecurity (08/19/2024)   Hunger Vital Sign    Worried About Running Out of Food in the Last Year: Never true    Ran Out of Food in the Last Year: Never true  Transportation Needs: No Transportation Needs (08/19/2024)   PRAPARE -  Administrator, Civil Service (Medical): No    Lack of Transportation (Non-Medical): No  Physical Activity: Sufficiently Active (08/19/2024)   Exercise Vital Sign    Days of Exercise per Week: 3 days    Minutes of Exercise per Session: 50 min  Stress: No Stress Concern Present (08/19/2024)   [employer] of Occupational Health - Occupational Stress Questionnaire    Feeling of Stress: Only a little  Social Connections: Moderately Integrated (08/19/2024)   Social Connection and Isolation Panel    Frequency of Communication with Friends and Family: More than three times a week    Frequency of Social Gatherings with Friends and Family: Once a week    Attends Religious Services: Never    Database administrator or Organizations: Yes    Attends Engineer, structural: More than 4 times per year  Marital Status: Married    Tobacco Counseling Counseling given: Not Answered   Clinical Intake:  Pre-visit preparation completed: Yes  Pain : No/denies pain     BMI - recorded: 28.06 Nutritional Status: BMI 25 -29 Overweight Nutritional Risks: None Diabetes: No  How often do you need to have someone help you when you read instructions, pamphlets, or other written materials from your doctor or pharmacy?: 1 - Never What is the last grade level you completed in school?: 14  Interpreter Needed?: No      Activities of Daily Living    08/19/2024    8:09 AM 02/16/2024   11:18 AM  In your present state of health, do you have any difficulty performing the following activities:  Hearing? 1   Vision? 0   Difficulty concentrating or making decisions? 1   Walking or climbing stairs? 1   Dressing or bathing? 0   Doing errands, shopping? 0 0  Preparing Food and eating ? N   Using the Toilet? N   In the past six months, have you accidently leaked urine? Y   Do you have problems with loss of bowel control? N   Managing your Medications? N   Managing your Finances? N    Housekeeping or managing your Housekeeping? N     Patient Care Team: Alvia Bring, DO as PCP - General (Family Medicine) Pietro Redell RAMAN, MD as PCP - Cardiology (Cardiology) Shaaron Lamar HERO, MD as Attending Physician (Gastroenterology)  Indicate any recent Medical Services you may have received from other than [facility] providers in the past year (date may be approximate).     Assessment:   This is a routine wellness examination for Tyler Gates.  Hearing/Vision screen No results found.   Goals Addressed             This Visit's Progress    Patient Stated       Patient states he would like to have his knee replaced.        Depression Screen    08/19/2024    8:14 AM 03/08/2024    1:20 PM 09/10/2023   12:49 PM 08/12/2023    8:11 AM 08/01/2023    8:36 AM 02/11/2023    3:05 PM 03/27/2022   10:04 AM  PHQ 2/9 Scores  PHQ - 2 Score 0 0 0 0 0 0 0  PHQ- 9 Score     0 0 0    Fall Risk    08/19/2024    8:15 AM 03/08/2024    1:19 PM 12/08/2023   10:13 AM 11/03/2023    1:26 PM 09/10/2023   12:49 PM  Fall Risk   Falls in the past year? 0 0 0 0 0  Number falls in past yr: 0 0   0  Injury with Fall? 0    0  Risk for fall due to : No Fall Risks No Fall Risks Impaired balance/gait No Fall Risks   Follow up Falls evaluation completed Falls evaluation completed Falls evaluation completed Falls evaluation completed     MEDICARE RISK AT HOME: Medicare Risk at Home Any stairs in or around the home?: Yes If so, are there any without handrails?: Yes Home free of loose throw rugs in walkways, pet beds, electrical cords, etc?: Yes Adequate lighting in your home to reduce risk of falls?: Yes Life alert?: No Use of a cane, walker or w/c?: Yes Grab bars in the bathroom?: Yes Shower chair or bench in shower?: No Elevated  toilet seat or a handicapped toilet?: No  TIMED UP AND GO:  Was the test performed?  No    Cognitive Function:        08/19/2024    8:16 AM 08/12/2023    8:17 AM  03/27/2022   10:13 AM  6CIT Screen  What Year? 0 points 0 points 0 points  What month? 0 points 0 points 0 points  What time? 0 points 0 points 0 points  Count back from 20 0 points 0 points 0 points  Months in reverse 0 points 0 points 0 points  Repeat phrase 0 points 2 points 2 points  Total Score 0 points 2 points 2 points    Immunizations Immunization History  Administered Date(s) Administered   Hepatitis A, Adult 01/24/2022, 07/29/2022   Hepb-cpg 01/24/2022, 02/21/2022   INFLUENZA, HIGH DOSE SEASONAL PF 10/15/2021   Influenza Whole 10/07/2008   Influenza,inj,Quad PF,6+ Mos 10/21/2018, 09/02/2019, 11/08/2020   Influenza-Unspecified 09/22/2022   PFIZER(Purple Top)SARS-COV-2 Vaccination 04/01/2020, 04/24/2020, 09/22/2022    TDAP status: Due, Education has been provided regarding the importance of this vaccine. Advised may receive this vaccine at local pharmacy or Health Dept. Aware to provide a copy of the vaccination record if obtained from local pharmacy or Health Dept. Verbalized acceptance and understanding.  Flu Vaccine status: Declined, Education has been provided regarding the importance of this vaccine but patient still declined. Advised may receive this vaccine at local pharmacy or Health Dept. Aware to provide a copy of the vaccination record if obtained from local pharmacy or Health Dept. Verbalized acceptance and understanding.  Pneumococcal vaccine status: Declined,  Education has been provided regarding the importance of this vaccine but patient still declined. Advised may receive this vaccine at local pharmacy or Health Dept. Aware to provide a copy of the vaccination record if obtained from local pharmacy or Health Dept. Verbalized acceptance and understanding.   Covid-19 vaccine status: Information provided on how to obtain vaccines.   Qualifies for Shingles Vaccine? Yes   Zostavax completed No   Shingrix Completed?: No.    Education has been provided regarding the  importance of this vaccine. Patient has been advised to call insurance company to determine out of pocket expense if they have not yet received this vaccine. Advised may also receive vaccine at local pharmacy or Health Dept. Verbalized acceptance and understanding.  Screening Tests Health Maintenance  Topic Date Due   DTaP/Tdap/Td (1 - Tdap) Never done   Pneumococcal Vaccine: 50+ Years (1 of 2 - PCV) Never done   Zoster Vaccines- Shingrix (1 of 2) Never done   COVID-19 Vaccine (4 - 2024-25 season) 08/24/2023   INFLUENZA VACCINE  07/23/2024   Medicare Annual Wellness (AWV)  08/19/2025   Colonoscopy  05/24/2030   Hepatitis C Screening  Completed   HPV VACCINES  Aged Out   Meningococcal B Vaccine  Aged Out   Hepatitis B Vaccines 19-59 Average Risk  Discontinued    Health Maintenance  Health Maintenance Due  Topic Date Due   DTaP/Tdap/Td (1 - Tdap) Never done   Pneumococcal Vaccine: 50+ Years (1 of 2 - PCV) Never done   Zoster Vaccines- Shingrix (1 of 2) Never done   COVID-19 Vaccine (4 - 2024-25 season) 08/24/2023   INFLUENZA VACCINE  07/23/2024    Colorectal cancer screening: Type of screening: Colonoscopy. Completed 05/24/2020. Repeat every 10 years  Lung Cancer Screening: (Low Dose CT Chest recommended if Age 52-80 years, 20 pack-year currently smoking OR have quit w/in  15years.) does not qualify.   Lung Cancer Screening Referral: n/a  Additional Screening:  Hepatitis C Screening: does qualify; Completed 12/27/2021  Vision Screening: Recommended annual ophthalmology exams for early detection of glaucoma and other disorders of the eye. Is the patient up to date with their annual eye exam?  Yes  Who is the provider or what is the name of the office in which the patient attends annual eye exams? [facility] If pt is not established with a provider, would they like to be referred to a provider to establish care? N/a.   Dental Screening: Recommended annual dental exams for  proper oral hygiene   Community Resource Referral / Chronic Care Management: CRR required this visit?  No   CCM required this visit?  No     Plan:     I have personally reviewed and noted the following in the patient's chart:   Medical and social history Use of alcohol , tobacco or illicit drugs  Current medications and supplements including opioid prescriptions. Patient is not currently taking opioid prescriptions. Functional ability and status Nutritional status Physical activity Advanced directives List of other physicians Hospitalizations # 0, surgeries # 1, and ER # 0 visits in previous 12 months Vitals Screenings to include cognitive, depression, and falls Referrals and appointments  In addition, I have reviewed and discussed with patient certain preventive protocols, quality metrics, and best practice recommendations. A written personalized care plan for preventive services as well as general preventive health recommendations were provided to patient.     Tyler Gates, CMA   08/19/2024   After Visit Summary: (MyChart) Due to this being a telephonic visit, the after visit summary with patients personalized plan was offered to patient via MyChart   Nurse Notes:   Tyler Gates is a 68 y.o. male patient of Alvia Bring, DO who had a Medicare Annual Wellness Visit today via telephone. Tyler Gates is Retired and lives with their spouse. He has 2 children. He reports that he is socially active and does interact with friends/family regularly. He is moderately physically active and enjoys cycling, kayaking and working on model cars.

## 2024-08-19 NOTE — Patient Instructions (Signed)
  Tyler Gates , Thank you for taking time to come for your Medicare Wellness Visit. I appreciate your ongoing commitment to your health goals. Please review the following plan we discussed and let me know if I can assist you in the future.   These are the goals we discussed:  Goals       care coordination activities      Interventions Today    Flowsheet Row Most Recent Value  Chronic Disease   Chronic disease during today's visit Other  [recent knee replacement]  General Interventions   General Interventions Discussed/Reviewed --  [pt shared that he will require a 3rd knee surgery, currenlty has a  picc line working with [facility] nurse to change -currenlty on IV antibiotic 3x per day for  6 weeks]  Doctor Visits Discussed/Reviewed Doctor Visits Reviewed  quintella 10/29 to remove stitches from surgery]  Mental Health Interventions   Mental Health Discussed/Reviewed Mental Health Discussed, Coping Strategies, Depression  [Assessed for mental health needs-active prior to surgery- now working towards  building his energy back up daily-continues to adjust to his medical condition, confirms having postive supports, denies need for mental health follow up at this time.]               Patient Stated (pt-stated)      He has been having some knee issues and would love to resolve that quickly to help maintain his current health.      Patient Stated (pt-stated)      Patient stated that he would like to be more active than last year.      Patient Stated      Patient states he would like to have his knee replaced.         This is a list of the screening recommended for you and due dates:  Health Maintenance  Topic Date Due   DTaP/Tdap/Td vaccine (1 - Tdap) Never done   Pneumococcal Vaccine for age over 62 (1 of 2 - PCV) Never done   Zoster (Shingles) Vaccine (1 of 2) Never done   COVID-19 Vaccine (4 - 2024-25 season) 08/24/2023   Flu Shot  07/23/2024   Medicare Annual Wellness Visit   08/19/2025   Colon Cancer Screening  05/24/2030   Hepatitis C Screening  Completed   HPV Vaccine  Aged Out   Meningitis B Vaccine  Aged Out   Hepatitis B Vaccine  Discontinued

## 2024-08-20 DIAGNOSIS — T8459XD Infection and inflammatory reaction due to other internal joint prosthesis, subsequent encounter: Secondary | ICD-10-CM | POA: Diagnosis not present

## 2024-08-22 ENCOUNTER — Other Ambulatory Visit: Payer: Self-pay | Admitting: Family Medicine

## 2024-08-22 DIAGNOSIS — F3340 Major depressive disorder, recurrent, in remission, unspecified: Secondary | ICD-10-CM

## 2024-08-24 NOTE — Telephone Encounter (Signed)
 Pls contact pt to schedule appt with Dr. Alvia with fasting labs. Sending 30 day med refill. No additional. Thanks

## 2024-08-24 NOTE — Telephone Encounter (Signed)
 Patient is scheduled for Friday September 5th

## 2024-08-27 ENCOUNTER — Ambulatory Visit (INDEPENDENT_AMBULATORY_CARE_PROVIDER_SITE_OTHER): Admitting: Family Medicine

## 2024-08-27 ENCOUNTER — Encounter: Payer: Self-pay | Admitting: Family Medicine

## 2024-08-27 VITALS — BP 122/80 | HR 77 | Ht 69.0 in | Wt 193.0 lb

## 2024-08-27 DIAGNOSIS — I10 Essential (primary) hypertension: Secondary | ICD-10-CM | POA: Diagnosis not present

## 2024-08-27 DIAGNOSIS — K5010 Crohn's disease of large intestine without complications: Secondary | ICD-10-CM | POA: Diagnosis not present

## 2024-08-27 DIAGNOSIS — E782 Mixed hyperlipidemia: Secondary | ICD-10-CM | POA: Diagnosis not present

## 2024-08-27 DIAGNOSIS — B079 Viral wart, unspecified: Secondary | ICD-10-CM | POA: Diagnosis not present

## 2024-08-27 DIAGNOSIS — T8453XD Infection and inflammatory reaction due to internal right knee prosthesis, subsequent encounter: Secondary | ICD-10-CM

## 2024-08-27 DIAGNOSIS — F3340 Major depressive disorder, recurrent, in remission, unspecified: Secondary | ICD-10-CM

## 2024-08-27 MED ORDER — VENLAFAXINE HCL 37.5 MG PO TABS: 37.5000 mg | ORAL_TABLET | Freq: Two times a day (BID) | ORAL | 1 refills | Status: AC

## 2024-08-27 MED ORDER — TRIAMCINOLONE ACETONIDE 0.1 % EX OINT: 1.0000 | TOPICAL_OINTMENT | Freq: Two times a day (BID) | CUTANEOUS | 1 refills | Status: AC

## 2024-08-27 NOTE — Progress Notes (Signed)
 Tyler Gates - 68 y.o. male MRN [id number]  Date of birth: [birth year]  Subjective Chief Complaint  Patient presents with   Medication Refill    HPI Tyler Gates is a 68 y.o. male here today for follow-up visit.  Patient is doing pretty well.  Continues on amlodipine  and olmesartan  for management of hypertension.  Blood pressure is well-controlled at this time.  Denies side effects from medication at current strength.  He has not had chest pain, shortness of breath, palpitations, headaches or vision changes.  Tolerating Crestor  well for management of hyperlipidemia.  He is back on Remicade  for management of his Crohn's disease.  He had experienced some extraintestinal manifestations including eczematous type rashes which have improved since adding Remicade  back on.  He continues with spacer in his right knee.  He is eager to get this out however his surgeon recommended he wait an additional 6 months to be sure infection is eradicated prior to reinsertion of new prosthesis.  Mood is stable with Effexor  at current strength.  ROS:  A comprehensive ROS was completed and negative except as noted per HPI  Allergies  Allergen Reactions   Sulfasalazine  Other (See Comments)    Flu like feelings   Statins Other (See Comments)    REACTION: Myalgia    Past Medical History:  Diagnosis Date   Arthritis    Crohn's disease (HCC)    Depression    Dyspnea on exertion    GERD (gastroesophageal reflux disease)    Herpes    History of kidney stones    Hyperlipidemia    Hypertension    Instability of knee joint    Left displaced femoral neck fracture (HCC) 07/11/2017   OSA (obstructive sleep apnea)    mild   Snoring     Past Surgical History:  Procedure Laterality Date   COLONOSCOPY  2007   Dr. Zena normal TI, ascending colon/cecum with mild inflammation noted but biopses without signs of active inflammation, hyperpastic polyp, possible ischemia on path of left colon biopsy    COLONOSCOPY N/A 11/08/2015   Procedure: COLONOSCOPY;  Surgeon: Lamar CHRISTELLA Hollingshead, MD;  Location: [facility];  Service: Endoscopy;  Laterality: N/A;   ESOPHAGOGASTRODUODENOSCOPY  2007   Dr. Zena reflux esophagitis, hiatal hernia, negative H.pylori   ESOPHAGOGASTRODUODENOSCOPY N/A 11/08/2015   Procedure: ESOPHAGOGASTRODUODENOSCOPY (EGD);  Surgeon: Lamar CHRISTELLA Hollingshead, MD;  Location: [facility];  Service: Endoscopy;  Laterality: N/A;  0830 - moved to 8:15 - office to notify   EXCISIONAL TOTAL KNEE ARTHROPLASTY WITH ANTIBIOTIC SPACERS Right 10/01/2023   Procedure: EXCISIONAL TOTAL KNEE ARTHROPLASTY WITH PLACEMENT OF ARTICULATING ANTIBIOTIC SPACER;  Surgeon: Fidel Rogue, MD;  Location: [facility];  Service: Orthopedics;  Laterality: Right;   HIP PINNING,CANNULATED Left 07/12/2017   Procedure: CANNULATED HIP PINNING LEFT;  Surgeon: Fidel Rogue, MD;  Location: [facility];  Service: Orthopedics;  Laterality: Left;   KNEE ARTHROPLASTY Right 04/23/2023   Procedure: COMPUTER ASSISTED TOTAL KNEE ARTHROPLASTY;  Surgeon: Fidel Rogue, MD;  Location: [facility];  Service: Orthopedics;  Laterality: Right;  160    Social History   Socioeconomic History   Marital status: Married    Spouse name: Christobal Morado   Number of children: 2   Years of education: 14   Highest education level: Some college, no degree  Occupational History   Occupation: Retired  Tobacco Use   Smoking status: Never   Smokeless tobacco: Never  Vaping Use   Vaping status: Never Used  Substance  and Sexual Activity   Alcohol  use: Not Currently   Drug use: No   Sexual activity: Not Currently    Birth control/protection: None  Other Topics Concern   Not on file  Social History Narrative   Lives with wife. He has two children. He enjoys cycling, kayaking and working on model cars.   Social Drivers of Corporate investment banker Strain: Low Risk  (08/19/2024)   Overall Financial Resource Strain (CARDIA)    Difficulty of Paying Living  Expenses: Not hard at all  Food Insecurity: No Food Insecurity (08/19/2024)   Hunger Vital Sign    Worried About Running Out of Food in the Last Year: Never true    Ran Out of Food in the Last Year: Never true  Transportation Needs: No Transportation Needs (08/19/2024)   PRAPARE - Administrator, Civil Service (Medical): No    Lack of Transportation (Non-Medical): No  Physical Activity: Sufficiently Active (08/19/2024)   Exercise Vital Sign    Days of Exercise per Week: 3 days    Minutes of Exercise per Session: 50 min  Stress: No Stress Concern Present (08/19/2024)   [employer] of Occupational Health - Occupational Stress Questionnaire    Feeling of Stress: Only a little  Social Connections: Moderately Integrated (08/19/2024)   Social Connection and Isolation Panel    Frequency of Communication with Friends and Family: More than three times a week    Frequency of Social Gatherings with Friends and Family: Once a week    Attends Religious Services: Never    Database administrator or Organizations: Yes    Attends Engineer, structural: More than 4 times per year    Marital Status: Married    Family History  Problem Relation Age of Onset   Melanoma Father    Heart disease Father    Healthy Mother    Colon cancer Neg Hx    Esophageal cancer Neg Hx     Health Maintenance  Topic Date Due   Pneumococcal Vaccine: 50+ Years (1 of 2 - PCV) Never done   COVID-19 Vaccine (4 - 2025-26 season) 08/23/2024   Zoster Vaccines- Shingrix (1 of 2) 11/26/2024 (Originally 11/17/1975)   Influenza Vaccine  03/22/2025 (Originally 07/23/2024)   DTaP/Tdap/Td (1 - Tdap) 08/27/2025 (Originally 11/17/1975)   Medicare Annual Wellness (AWV)  08/19/2025   Colonoscopy  05/24/2030   Hepatitis C Screening  Completed   HPV VACCINES  Aged Out   Meningococcal B Vaccine  Aged Out   Hepatitis B Vaccines 19-59 Average Risk  Discontinued      ----------------------------------------------------------------------------------------------------------------------------------------------------------------------------------------------------------------- Physical Exam BP 122/80 (BP Location: Left Arm, Patient Position: Sitting, Cuff Size: Normal)   Pulse 77   Ht 5' 9 (1.753 m)   Wt 193 lb (87.5 kg)   SpO2 98%   BMI 28.50 kg/m   Physical Exam Constitutional:      Appearance: Normal appearance.  HENT:     Head: Normocephalic and atraumatic.  Eyes:     General: No scleral icterus. Cardiovascular:     Rate and Rhythm: Normal rate and regular rhythm.  Pulmonary:     Effort: Pulmonary effort is normal.     Breath sounds: Normal breath sounds.  Neurological:     Mental Status: He is alert.  Psychiatric:        Mood and Affect: Mood normal.        Behavior: Behavior normal.     ------------------------------------------------------------------------------------------------------------------------------------------------------------------------------------------------------------------- Assessment and Plan  Depression, major, recurrent, in remission (HCC) He remains on Effexor .  This is still working pretty well for him.  Will plan to continue.  Hyperlipidemia Lab Results  Component Value Date   LDLCALC 85 11/06/2022   Tolerating Crestor  well at current strength.  Will plan to continue.  Essential hypertension BP remains well controlled.  We'll plan to continue current antihypertensive regimen.   Crohn's disease of colon without complication (HCC) Followed by gastroenterology.  Remains on Remicade .  Stable with this at this time.  Infection of prosthetic right knee joint (HCC) Status post removal of hardware.  Continues with antibiotic spacer.  Plan is to leave that in for additional 6 months   Meds ordered this encounter  Medications   triamcinolone  ointment (KENALOG ) 0.1 %    Sig: Apply 1 Application  topically 2 (two) times daily.    Dispense:  80 g    Refill:  1   venlafaxine  (EFFEXOR ) 37.5 MG tablet    Sig: Take 1 tablet (37.5 mg total) by mouth 2 (two) times daily.    Dispense:  180 tablet    Refill:  1    No follow-ups on file.

## 2024-08-29 NOTE — Assessment & Plan Note (Signed)
 He remains on Effexor .  This is still working pretty well for him.  Will plan to continue.

## 2024-08-29 NOTE — Assessment & Plan Note (Signed)
 Lab Results  Component Value Date   LDLCALC 85 11/06/2022   Tolerating Crestor  well at current strength.  Will plan to continue.

## 2024-08-29 NOTE — Assessment & Plan Note (Signed)
BP remains well controlled.  We'll plan to continue current antihypertensive regimen.

## 2024-08-29 NOTE — Assessment & Plan Note (Signed)
 Status post removal of hardware.  Continues with antibiotic spacer.  Plan is to leave that in for additional 6 months

## 2024-08-29 NOTE — Assessment & Plan Note (Signed)
 Followed by gastroenterology.  Remains on Remicade .  Stable with this at this time.

## 2024-09-14 ENCOUNTER — Other Ambulatory Visit: Payer: Self-pay

## 2024-09-14 DIAGNOSIS — Z96651 Presence of right artificial knee joint: Secondary | ICD-10-CM | POA: Diagnosis not present

## 2024-09-14 DIAGNOSIS — M25461 Effusion, right knee: Secondary | ICD-10-CM | POA: Diagnosis not present

## 2024-09-14 DIAGNOSIS — T8453XD Infection and inflammatory reaction due to internal right knee prosthesis, subsequent encounter: Secondary | ICD-10-CM | POA: Diagnosis not present

## 2024-09-14 DIAGNOSIS — K5010 Crohn's disease of large intestine without complications: Secondary | ICD-10-CM

## 2024-09-16 ENCOUNTER — Other Ambulatory Visit: Payer: Self-pay | Admitting: Cardiology

## 2024-09-23 DIAGNOSIS — L400 Psoriasis vulgaris: Secondary | ICD-10-CM | POA: Diagnosis not present

## 2024-09-23 DIAGNOSIS — B079 Viral wart, unspecified: Secondary | ICD-10-CM | POA: Diagnosis not present

## 2024-09-26 ENCOUNTER — Other Ambulatory Visit: Payer: Self-pay | Admitting: Internal Medicine

## 2024-09-26 DIAGNOSIS — E785 Hyperlipidemia, unspecified: Secondary | ICD-10-CM

## 2024-09-28 ENCOUNTER — Other Ambulatory Visit: Payer: Self-pay

## 2024-09-28 ENCOUNTER — Other Ambulatory Visit

## 2024-09-28 DIAGNOSIS — K5010 Crohn's disease of large intestine without complications: Secondary | ICD-10-CM

## 2024-09-28 DIAGNOSIS — T8459XD Infection and inflammatory reaction due to other internal joint prosthesis, subsequent encounter: Secondary | ICD-10-CM | POA: Diagnosis not present

## 2024-10-04 ENCOUNTER — Encounter: Payer: Self-pay | Admitting: Gastroenterology

## 2024-10-05 ENCOUNTER — Telehealth: Payer: Self-pay

## 2024-10-05 ENCOUNTER — Encounter: Payer: Self-pay | Admitting: Family Medicine

## 2024-10-05 NOTE — Telephone Encounter (Signed)
 Patients wife dropped off paperwork for preoperative clearance and I've placed it in your workbox. Patient is aware that this paperwork can take 3-5 business days.

## 2024-10-06 LAB — SERIAL MONITORING

## 2024-10-06 LAB — INFLIXIMAB+AB (SERIAL MONITOR)
Anti-Infliximab Antibody: 146 ng/mL
Infliximab Drug Level: 9.9 ug/mL

## 2024-10-07 ENCOUNTER — Ambulatory Visit

## 2024-10-08 ENCOUNTER — Telehealth: Payer: Self-pay | Admitting: Pharmacy Technician

## 2024-10-08 NOTE — Telephone Encounter (Signed)
 Dr. Jerie, The patient assistance program (PAP) will no longer offer Remicade  to patients.  They have changed to Avsola  or Inflectra . Would you like to use Avsola  or Inflectra ?? Please provide dose and frequency.  Thanks Luke

## 2024-10-08 NOTE — Telephone Encounter (Signed)
Document has been faxed. 

## 2024-10-08 NOTE — Telephone Encounter (Signed)
 Patient is aware that the paperwork is completed.

## 2024-10-11 ENCOUNTER — Ambulatory Visit: Payer: Self-pay | Admitting: Gastroenterology

## 2024-10-14 ENCOUNTER — Telehealth: Payer: Self-pay | Admitting: Gastroenterology

## 2024-10-14 NOTE — Telephone Encounter (Signed)
 [facility] is working on the enrollment process, if anything else is needed I will f/u with you. Thanks [facility]

## 2024-10-14 NOTE — Telephone Encounter (Signed)
 Call to [facility] and requested clearance form be faxed to [phone].

## 2024-10-14 NOTE — Telephone Encounter (Signed)
 Inbound call from patient stating that [facility] send over a medical clearance form for Dr. San to fill out and fax back over to them. Patient is just wanting to know if we have received that fax and that it is being fill out. Patient was advised that Dr. San fax number was given to [facility]. Patient would like for a mychart message to be sent to him confirm we have it and that he is filling it out in order for him to have his surgery. Please advise.

## 2024-10-15 ENCOUNTER — Ambulatory Visit (INDEPENDENT_AMBULATORY_CARE_PROVIDER_SITE_OTHER)

## 2024-10-15 VITALS — BP 142/88 | HR 72 | Temp 97.8°F | Resp 18 | Ht 69.0 in | Wt 196.8 lb

## 2024-10-15 DIAGNOSIS — K5010 Crohn's disease of large intestine without complications: Secondary | ICD-10-CM

## 2024-10-15 MED ORDER — DIPHENHYDRAMINE HCL 25 MG PO CAPS: 25.0000 mg | ORAL_CAPSULE | Freq: Once | ORAL | Status: DC

## 2024-10-15 MED ORDER — SODIUM CHLORIDE 0.9 % IV SOLN
5.0000 mg/kg | Freq: Once | INTRAVENOUS | Status: AC
  Administered 2024-10-15: 400 mg via INTRAVENOUS
  Filled 2024-10-15: qty 40

## 2024-10-15 MED ORDER — METHYLPREDNISOLONE SODIUM SUCC 40 MG IJ SOLR
40.0000 mg | Freq: Once | INTRAMUSCULAR | Status: AC
  Administered 2024-10-15: 40 mg via INTRAVENOUS
  Filled 2024-10-15: qty 1

## 2024-10-15 MED ORDER — ACETAMINOPHEN 325 MG PO TABS
650.0000 mg | ORAL_TABLET | Freq: Once | ORAL | Status: AC
  Administered 2024-10-15: 650 mg via ORAL
  Filled 2024-10-15: qty 2

## 2024-10-15 NOTE — Telephone Encounter (Signed)
 Patient aware that preop clearance letter has been completed by Dr San and faxed to [facility].  Patient will come by the office to pick up original paper work.

## 2024-10-15 NOTE — Progress Notes (Signed)
 Diagnosis: Crohn's Disease  Provider:  Praveen Mannam MD  Procedure: IV Infusion  IV Type: Peripheral, IV Location: R Hand  Remicade  (Infliximab ), Dose: 400 mg  Infusion Start Time: 0921  Infusion Stop Time: 1141  Post Infusion IV Care: Peripheral IV Discontinued  Discharge: Condition: Good, Destination: Home . AVS Declined  Performed by:  Eleanor DELENA Bloch, RN

## 2024-10-15 NOTE — Telephone Encounter (Signed)
 From brought to the office by wife and will make Dr San aware to see if he will complete the form.

## 2024-10-22 ENCOUNTER — Telehealth: Payer: Self-pay | Admitting: Pharmacy Technician

## 2024-10-22 NOTE — Telephone Encounter (Signed)
 Auth Submission: APPROVED Site of care: Site of care: [facility] Payer: [employer] MEDICARE Medication & CPT/J Code(s) submitted: Remicade  (Infliximab ) J1745 Diagnosis Code: K50.10 Route of submission (phone, fax, portal):  Phone # Fax # Auth type: FREE DRUG Units/visits requested: 5MG /KG [id number] Reference number: [id number] Approval from: 12/24/23 to 12/22/25

## 2024-10-25 DIAGNOSIS — H524 Presbyopia: Secondary | ICD-10-CM | POA: Diagnosis not present

## 2024-10-25 DIAGNOSIS — H353131 Nonexudative age-related macular degeneration, bilateral, early dry stage: Secondary | ICD-10-CM | POA: Diagnosis not present

## 2024-10-28 ENCOUNTER — Ambulatory Visit: Payer: Self-pay | Admitting: Student

## 2024-10-28 MED ORDER — VANCOMYCIN HCL 10 G IV SOLR
1000.0000 mg | Freq: Once | INTRAVENOUS | Status: AC
  Administered 2024-11-11: 1000 mg via INTRAVENOUS

## 2024-10-28 NOTE — Progress Notes (Signed)
 Sent message, via epic in basket, requesting orders in epic from Careers adviser.

## 2024-10-30 ENCOUNTER — Other Ambulatory Visit: Payer: Self-pay | Admitting: Cardiology

## 2024-10-30 DIAGNOSIS — E785 Hyperlipidemia, unspecified: Secondary | ICD-10-CM

## 2024-11-02 NOTE — Patient Instructions (Signed)
 SURGICAL WAITING ROOM VISITATION  Patients having surgery or a procedure may have no more than 2 support people in the waiting area - these visitors may rotate.    Children under the age of 39 must have an adult with them who is not the patient.  Visitors with respiratory illnesses are discouraged from visiting and should remain at home.  If the patient needs to stay at the hospital during part of their recovery, the visitor guidelines for inpatient rooms apply. Pre-op nurse will coordinate an appropriate time for 1 support person to accompany patient in pre-op.  This support person may not rotate.    Please refer to the [facility] website for the visitor guidelines for Inpatients (after your surgery is over and you are in a regular room).       Your procedure is scheduled on: 11/11/24   Report to [facility] Main Entrance    Report to admitting at 5:15 AM   Call this number if you have problems the morning of surgery [phone]   Do not eat food :After Midnight.   After Midnight you may have the following liquids until 4:30 AM DAY OF SURGERY  Water  Non-Citrus Juices (without pulp, NO RED-Apple, White grape, White cranberry) Black Coffee (NO MILK/CREAM OR CREAMERS, sugar ok)  Clear Tea (NO MILK/CREAM OR CREAMERS, sugar ok) regular and decaf                             Plain Jell-O (NO RED)                                           Fruit ices (not with fruit pulp, NO RED)                                     Popsicles (NO RED)                                                               Sports drinks like Gatorade (NO RED)                 The day of surgery:  Drink ONE (1) Pre-Surgery Clear Ensure  at 4:30 AM the morning of surgery. Drink in one sitting. Do not sip.  This drink was given to you during your hospital  pre-op appointment visit. Nothing else to drink after completing the  Pre-Surgery Clear Ensure.    Oral Hygiene is also important to reduce your  risk of infection.                                    Remember - BRUSH YOUR TEETH THE MORNING OF SURGERY WITH YOUR REGULAR TOOTHPASTE  DENTURES WILL BE REMOVED PRIOR TO SURGERY PLEASE DO NOT APPLY Poly grip OR ADHESIVES!!!   Stop all vitamins and herbal supplements 7 days before surgery.   Take these medicines the morning of surgery with A SIP OF WATER : Amlodipine , exetimibe, omeprazole , rosuvastatin , Venlafaxine (effexor )  Do not take olmesartan (benicar ) the morning of surgery.   Bring CPAP mask and tubing day of surgery.                              You may not have any metal on your body including hair pins, jewelry, and body piercing             Do not wear make-up, lotions, powders, perfumes/cologne, or deodorant              Men may shave face and neck.   Do not bring valuables to the hospital. [place] IS NOT             RESPONSIBLE   FOR VALUABLES.   Contacts, glasses, dentures or bridgework may not be worn into surgery.   Bring small overnight bag day of surgery.   DO NOT BRING YOUR HOME MEDICATIONS TO THE HOSPITAL. PHARMACY WILL DISPENSE MEDICATIONS LISTED ON YOUR MEDICATION LIST TO YOU DURING YOUR ADMISSION IN THE HOSPITAL!    Patients discharged on the day of surgery will not be allowed to drive home.  Someone NEEDS to stay with you for the first 24 hours after anesthesia.   Special Instructions: Bring a copy of your healthcare power of attorney and living will documents the day of surgery if you haven't scanned them before.              Please read over the following fact sheets you were given: IF YOU HAVE QUESTIONS ABOUT YOUR PRE-OP INSTRUCTIONS PLEASE CALL [phone] [place]   If you received a COVID test during your pre-op visit  it is requested that you wear a mask when out in public, stay away from anyone that may not be feeling well and notify your surgeon if you develop symptoms. If you test positive for Covid or have been in contact with  anyone that has tested positive in the last 10 days please notify you surgeon.      Pre-operative 4 CHG Bath Instructions  DYNA-Hex 4 Chlorhexidine  Gluconate 4% Solution Antiseptic 4 fl. oz   You can play a key role in reducing the risk of infection after surgery. Your skin needs to be as free of germs as possible. You can reduce the number of germs on your skin by washing with CHG (chlorhexidine  gluconate) soap before surgery. CHG is an antiseptic soap that kills germs and continues to kill germs even after washing.   DO NOT use if you have an allergy to chlorhexidine /CHG or antibacterial soaps. If your skin becomes reddened or irritated, stop using the CHG and notify one of our RNs at   Please shower with the CHG soap starting 4 days before surgery using the following schedule:     Please keep in mind the following:  DO NOT shave, including legs and underarms, starting the day of your first shower.   You may shave your face at any point before/day of surgery.  Place clean sheets on your bed the day you start using CHG soap. Use a clean washcloth (not used since being washed) for each shower. DO NOT sleep with pets once you start using the CHG.  CHG Shower Instructions:  If you choose to wash your hair and private area, wash first with your normal shampoo/soap.  After you use shampoo/soap, rinse your hair and body thoroughly to remove shampoo/soap residue.  Turn the water  OFF and apply about 3 tablespoons (  45 ml) of CHG soap to a CLEAN washcloth.  Apply CHG soap ONLY FROM YOUR NECK DOWN TO YOUR TOES (washing for 3-5 minutes)  DO NOT use CHG soap on face, private areas, open wounds, or sores.  Pay special attention to the area where your surgery is being performed.  If you are having back surgery, having someone wash your back for you may be helpful. Wait 2 minutes after CHG soap is applied, then you may rinse off the CHG soap.  Pat dry with a clean towel  Put on clean clothes/pajamas    If you choose to wear lotion, please use ONLY the CHG-compatible lotions on the back of this paper.     Additional instructions for the day of surgery: DO NOT APPLY any lotions, deodorants, cologne, or perfumes.   Put on clean/comfortable clothes.  Brush your teeth.  Ask your nurse before applying any prescription medications to the skin.   CHG Compatible Lotions   Aveeno Moisturizing lotion  Cetaphil Moisturizing Cream  Cetaphil Moisturizing Lotion  Clairol Herbal Essence Moisturizing Lotion, Dry Skin  Clairol Herbal Essence Moisturizing Lotion, Extra Dry Skin  Clairol Herbal Essence Moisturizing Lotion, Normal Skin  Curel Age Defying Therapeutic Moisturizing Lotion with Alpha Hydroxy  Curel Extreme Care Body Lotion  Curel Soothing Hands Moisturizing Hand Lotion  Curel Therapeutic Moisturizing Cream, Fragrance-Free  Curel Therapeutic Moisturizing Lotion, Fragrance-Free  Curel Therapeutic Moisturizing Lotion, Original Formula  Eucerin Daily Replenishing Lotion  Eucerin Dry Skin Therapy Plus Alpha Hydroxy Crme  Eucerin Dry Skin Therapy Plus Alpha Hydroxy Lotion  Eucerin Original Crme  Eucerin Original Lotion  Eucerin Plus Crme Eucerin Plus Lotion  Eucerin TriLipid Replenishing Lotion  Keri Anti-Bacterial Hand Lotion  Keri Deep Conditioning Original Lotion Dry Skin Formula Softly Scented  Keri Deep Conditioning Original Lotion, Fragrance Free Sensitive Skin Formula  Keri Lotion Fast Absorbing Fragrance Free Sensitive Skin Formula  Keri Lotion Fast Absorbing Softly Scented Dry Skin Formula  Keri Original Lotion  Keri Skin Renewal Lotion Keri Silky Smooth Lotion  Keri Silky Smooth Sensitive Skin Lotion  Nivea Body Creamy Conditioning Oil  Nivea Body Extra Enriched Lotion  Nivea Body Original Lotion  Nivea Body Sheer Moisturizing Lotion Nivea Crme  Nivea Skin Firming Lotion  NutraDerm 30 Skin Lotion  NutraDerm Skin Lotion  NutraDerm Therapeutic Skin Cream  NutraDerm  Therapeutic Skin Lotion  ProShield Protective Hand Cream  Incentive Spirometer  An incentive spirometer is a tool that can help keep your lungs clear and active. This tool measures how well you are filling your lungs with each breath. Taking long deep breaths may help reverse or decrease the chance of developing breathing (pulmonary) problems (especially infection) following: A long period of time when you are unable to move or be active. BEFORE THE PROCEDURE  If the spirometer includes an indicator to show your best effort, your nurse or respiratory therapist will set it to a desired goal. If possible, sit up straight or lean slightly forward. Try not to slouch. Hold the incentive spirometer in an upright position. INSTRUCTIONS FOR USE  Sit on the edge of your bed if possible, or sit up as far as you can in bed or on a chair. Hold the incentive spirometer in an upright position. Breathe out normally. Place the mouthpiece in your mouth and seal your lips tightly around it. Breathe in slowly and as deeply as possible, raising the piston or the ball toward the top of the column. Hold your breath for 3-5 seconds  or for as long as possible. Allow the piston or ball to fall to the bottom of the column. Remove the mouthpiece from your mouth and breathe out normally. Rest for a few seconds and repeat Steps 1 through 7 at least 10 times every 1-2 hours when you are awake. Take your time and take a few normal breaths between deep breaths. The spirometer may include an indicator to show your best effort. Use the indicator as a goal to work toward during each repetition. After each set of 10 deep breaths, practice coughing to be sure your lungs are clear. If you have an incision (the cut made at the time of surgery), support your incision when coughing by placing a pillow or rolled up towels firmly against it. Once you are able to get out of bed, walk around indoors and cough well. You may stop using the  incentive spirometer when instructed by your caregiver.  RISKS AND COMPLICATIONS Take your time so you do not get dizzy or light-headed. If you are in pain, you may need to take or ask for pain medication before doing incentive spirometry. It is harder to take a deep breath if you are having pain. AFTER USE Rest and breathe slowly and easily. It can be helpful to keep track of a log of your progress. Your caregiver can provide you with a simple table to help with this. If you are using the spirometer at home, follow these instructions: SEEK MEDICAL CARE IF:  You are having difficultly using the spirometer. You have trouble using the spirometer as often as instructed. Your pain medication is not giving enough relief while using the spirometer. You develop fever of 100.5 F (38.1 C) or higher. SEEK IMMEDIATE MEDICAL CARE IF:  You cough up bloody sputum that had not been present before. You develop fever of 102 F (38.9 C) or greater. You develop worsening pain at or near the incision site. MAKE SURE YOU:  Understand these instructions. Will watch your condition. Will get help right away if you are not doing well or get worse.  WHAT IS A BLOOD TRANSFUSION? Blood Transfusion Information  A transfusion is the replacement of blood or some of its parts. Blood is made up of multiple cells which provide different functions. Red blood cells carry oxygen and are used for blood loss replacement. White blood cells fight against infection. Platelets control bleeding. Plasma helps clot blood. Other blood products are available for specialized needs, such as hemophilia or other clotting disorders. BEFORE THE TRANSFUSION  Who gives blood for transfusions?  Healthy volunteers who are fully evaluated to make sure their blood is safe. This is blood bank blood. Transfusion therapy is the safest it has ever been in the practice of medicine. Before blood is taken from a donor, a complete history is taken  to make sure that person has no history of diseases nor engages in risky social behavior (examples are intravenous drug use or sexual activity with multiple partners). The donor's travel history is screened to minimize risk of transmitting infections, such as malaria. The donated blood is tested for signs of infectious diseases, such as HIV and hepatitis. The blood is then tested to be sure it is compatible with you in order to minimize the chance of a transfusion reaction. If you or a relative donates blood, this is often done in anticipation of surgery and is not appropriate for emergency situations. It takes many days to process the donated blood. RISKS AND COMPLICATIONS Although  transfusion therapy is very safe and saves many lives, the main dangers of transfusion include:  Getting an infectious disease. Developing a transfusion reaction. This is an allergic reaction to something in the blood you were given. Every precaution is taken to prevent this. The decision to have a blood transfusion has been considered carefully by your caregiver before blood is given. Blood is not given unless the benefits outweigh the risks. AFTER THE TRANSFUSION Right after receiving a blood transfusion, you will usually feel much better and more energetic. This is especially true if your red blood cells have gotten low (anemic). The transfusion raises the level of the red blood cells which carry oxygen, and this usually causes an energy increase. The nurse administering the transfusion will monitor you carefully for complications. HOME CARE INSTRUCTIONS  No special instructions are needed after a transfusion. You may find your energy is better. Speak with your caregiver about any limitations on activity for underlying diseases you may have. SEEK MEDICAL CARE IF:  Your condition is not improving after your transfusion. You develop redness or irritation at the intravenous (IV) site. SEEK IMMEDIATE MEDICAL CARE IF:  Any  of the following symptoms occur over the next 12 hours: Shaking chills. You have a temperature by mouth above 102 F (38.9 C), not controlled by medicine. Chest, back, or muscle pain. People around you feel you are not acting correctly or are confused. Shortness of breath or difficulty breathing. Dizziness and fainting. You get a rash or develop hives. You have a decrease in urine output. Your urine turns a dark color or changes to pink, red, or brown. Any of the following symptoms occur over the next 10 days: You have a temperature by mouth above 102 F (38.9 C), not controlled by medicine. Shortness of breath. Weakness after normal activity. The white part of the eye turns yellow (jaundice). You have a decrease in the amount of urine or are urinating less often. Your urine turns a dark color or changes to pink, red, or brown. Document Released: 12/06/2000 Document Revised: 03/02/2012 Document Reviewed: 07/25/2008 [facility] Patient Information 2014 [place], [place].

## 2024-11-02 NOTE — Progress Notes (Addendum)
 COVID Vaccine received:  []  No [x]  Yes Date of any COVID positive Test in last 90 days: no PCP - Velma Ku DO Cardiologist - Redell Shallow MD  Chest x-ray -  EKG -   Stress Test - 08/19/18 Epic ECHO - 05/15/22 Epic Cardiac Cath -   Bowel Prep - [x]  No  []   Yes ______  Pacemaker / ICD device [x]  No []  Yes   Spinal Cord Stimulator:[x]  No []  Yes       History of Sleep Apnea? [x]  No []  Yes   CPAP used?- [x]  No []  Yes    Does the patient monitor blood sugar?          [x]  No []  Yes  []  N/A  Patient has: [x]  NO Hx DM   []  Pre-DM                 []  DM1  []   DM2 Does patient have a [employer] or Dexacom? []  No []  Yes   Fasting Blood Sugar Ranges-  Checks Blood Sugar _____ times a day  GLP1 agonist / usual dose - no GLP1 instructions:  SGLT-2 inhibitors / usual dose - no SGLT-2 instructions:   Blood Thinner / Instructions:no Aspirin  Instructions:no  Comments:   Activity level: Patient is able  to climb a flight of stairs without difficulty; [x]  No CP  [x]  No SOB, Patient can perform ADLs without assistance.   Anesthesia review:   Patient denies shortness of breath, fever, cough and chest pain at PAT appointment.  Patient verbalized understanding and agreement to the Pre-Surgical Instructions that were given to them at this PAT appointment. Patient was also educated of the need to review these PAT instructions again prior to his/her surgery.I reviewed the appropriate phone numbers to call if they have any and questions or concerns.

## 2024-11-04 ENCOUNTER — Encounter (HOSPITAL_COMMUNITY): Payer: Self-pay

## 2024-11-04 ENCOUNTER — Encounter (HOSPITAL_COMMUNITY)
Admission: RE | Admit: 2024-11-04 | Discharge: 2024-11-04 | Disposition: A | Discharge status: Home / Self Care | Source: Ambulatory Visit | Attending: Orthopedic Surgery | Admitting: Orthopedic Surgery

## 2024-11-04 ENCOUNTER — Other Ambulatory Visit: Payer: Self-pay

## 2024-11-04 VITALS — BP 141/90 | HR 98 | Temp 98.0°F | Resp 18 | Ht 69.0 in | Wt 195.0 lb

## 2024-11-04 DIAGNOSIS — Z01818 Encounter for other preprocedural examination: Secondary | ICD-10-CM | POA: Insufficient documentation

## 2024-11-04 DIAGNOSIS — I10 Essential (primary) hypertension: Secondary | ICD-10-CM | POA: Insufficient documentation

## 2024-11-04 LAB — SURGICAL PCR SCREEN
MRSA, PCR: NEGATIVE
Staphylococcus aureus: NEGATIVE

## 2024-11-04 LAB — BASIC METABOLIC PANEL WITH GFR
Anion gap: 9 (ref 5–15)
BUN: 19 mg/dL (ref 8–23)
CO2: 25 mmol/L (ref 22–32)
Calcium: 9.7 mg/dL (ref 8.9–10.3)
Chloride: 105 mmol/L (ref 98–111)
Creatinine, Ser: 1.28 mg/dL — ABNORMAL HIGH (ref 0.61–1.24)
GFR, Estimated: >60 mL/min (ref >60)
Glucose, Bld: 95 mg/dL (ref 70–99)
Potassium: 4.2 mmol/L (ref 3.5–5.1)
Sodium: 139 mmol/L (ref 135–145)

## 2024-11-04 LAB — CBC
HCT: 45.8 % (ref 39.0–52.0)
Hemoglobin: 15.1 g/dL (ref 13.0–17.0)
MCH: 30.9 pg (ref 26.0–34.0)
MCHC: 33 g/dL (ref 30.0–36.0)
MCV: 93.7 fL (ref 80.0–100.0)
Platelets: 239 K/uL (ref 150–400)
RBC: 4.89 MIL/uL (ref 4.22–5.81)
RDW: 12.7 % (ref 11.5–15.5)
WBC: 7.9 K/uL (ref 4.0–10.5)
nRBC: 0 % (ref 0.0–0.2)

## 2024-11-10 ENCOUNTER — Ambulatory Visit: Payer: Self-pay | Admitting: Student

## 2024-11-10 ENCOUNTER — Other Ambulatory Visit: Payer: Self-pay | Admitting: Cardiology

## 2024-11-10 NOTE — H&P (View-Only) (Signed)
 TOTAL KNEE REVISION ADMISSION H&P  Patient is being admitted for right revision total knee arthroplasty.  Subjective:  Chief Complaint:right knee pain.  HPI: Tyler Gates, 68 y.o. male, has a history of pain and functional disability in the right knee(s) due to periprosthetic joint infection, s/p resection of right total knee arthroplasty and placement of antibiotic spacer 10/01/23.  He has completed IV antibiotics with ID, current cultures negative. Onset of symptoms was gradual starting 1 years ago with stable course since that time.  Prior procedures on the right knee(s) include arthroplasty and right TKA resection and placement of antibiotic spacer.  Patient currently rates pain in the right knee(s) at 2 out of 10 with activity.  There is no current active infection.   Patient Active Problem List   Diagnosis Date Noted   Diarrhea 10/10/2023   Prosthetic joint infection 10/10/2023   Medication management 10/10/2023   Hypotension due to hypovolemia 10/10/2023   Infection of total right knee replacement, subsequent encounter 10/01/2023   Infection of prosthetic right knee joint 09/22/2023   Infection of total right knee replacement 08/27/2023   Rash 08/01/2023   Body aches 08/01/2023   Osteoarthritis of right knee 04/23/2023   Preoperative clearance 02/11/2023   Chronic sinusitis 03/12/2022   Visual disturbance 03/12/2022   Acute sinusitis 01/29/2022   Chronic kidney disease (CKD) stage G2/A2, mildly decreased glomerular filtration rate (GFR) between 60-89 mL/min/1.73 square meter and albuminuria creatinine ratio between 30-299 mg/g 10/22/2018   Hypogonadism male 10/21/2018   Screening PSA (prostate specific antigen) 10/21/2018   Chronic kidney disease due to benign hypertension 10/21/2018   Depression, major, recurrent, in remission 10/21/2018   Statin intolerance 09/14/2018   Agatston coronary artery calcium  score greater than 400 08/10/2018   Family history of coronary artery  disease 08/10/2018   Crohn's disease of colon without complication (HCC) 05/09/2017   Mucosal abnormality of esophagus    Hiatal hernia    KNEE JOINT INSTABILITY 03/07/2007   Hyperlipidemia 12/30/2006   Essential hypertension 12/30/2006   GERD 12/30/2006   Past Medical History:  Diagnosis Date   Arthritis    Crohn's disease (HCC)    Depression    Dyspnea on exertion    GERD (gastroesophageal reflux disease)    Herpes    History of kidney stones    Hyperlipidemia    Hypertension    Instability of knee joint    Left displaced femoral neck fracture (HCC) 07/11/2017   OSA (obstructive sleep apnea)    mild   Snoring     Past Surgical History:  Procedure Laterality Date   COLONOSCOPY  2007   Dr. Zena normal TI, ascending colon/cecum with mild inflammation noted but biopses without signs of active inflammation, hyperpastic polyp, possible ischemia on path of left colon biopsy   COLONOSCOPY N/A 11/08/2015   Procedure: COLONOSCOPY;  Surgeon: Lamar CHRISTELLA Hollingshead, MD;  Location: [facility];  Service: Endoscopy;  Laterality: N/A;   ESOPHAGOGASTRODUODENOSCOPY  2007   Dr. Zena reflux esophagitis, hiatal hernia, negative H.pylori   ESOPHAGOGASTRODUODENOSCOPY N/A 11/08/2015   Procedure: ESOPHAGOGASTRODUODENOSCOPY (EGD);  Surgeon: Lamar CHRISTELLA Hollingshead, MD;  Location: [facility];  Service: Endoscopy;  Laterality: N/A;  0830 - moved to 8:15 - office to notify   EXCISIONAL TOTAL KNEE ARTHROPLASTY WITH ANTIBIOTIC SPACERS Right 10/01/2023   Procedure: EXCISIONAL TOTAL KNEE ARTHROPLASTY WITH PLACEMENT OF ARTICULATING ANTIBIOTIC SPACER;  Surgeon: Fidel Rogue, MD;  Location: [facility];  Service: Orthopedics;  Laterality: Right;   HIP PINNING,CANNULATED  Left 07/12/2017   Procedure: CANNULATED HIP PINNING LEFT;  Surgeon: Fidel Rogue, MD;  Location: [facility];  Service: Orthopedics;  Laterality: Left;   KNEE ARTHROPLASTY Right 04/23/2023   Procedure: COMPUTER ASSISTED TOTAL KNEE ARTHROPLASTY;  Surgeon: Fidel Rogue, MD;  Location: [facility];  Service: Orthopedics;  Laterality: Right;  160    Current Outpatient Medications  Medication Sig Dispense Refill Last Dose/Taking   amLODipine  (NORVASC ) 5 MG tablet TAKE 2 TABLETS BY MOUTH EVERY DAY 120 tablet 0    ezetimibe  (ZETIA ) 10 MG tablet TAKE 1 TABLET BY MOUTH EVERY DAY 15 tablet 0    inFLIXimab  (REMICADE ) 100 MG injection INFUSE 400MG  IV EVERY 8 WEEKS      meloxicam (MOBIC) 15 MG tablet Take 15 mg by mouth in the morning.      olmesartan  (BENICAR ) 20 MG tablet TAKE 1 TABLET BY MOUTH EVERY DAY 90 tablet 3    omeprazole  (PRILOSEC) 40 MG capsule TAKE 1 CAPSULE (40 MG TOTAL) BY MOUTH IN THE MORNING AND AT BEDTIME. 180 capsule 3    rosuvastatin  (CRESTOR ) 20 MG tablet TAKE 1 TABLET BY MOUTH EVERY DAY 90 tablet 3    triamcinolone  ointment (KENALOG ) 0.1 % Apply 1 Application topically 2 (two) times daily. (Patient taking differently: Apply 1 Application topically 2 (two) times daily as needed (irritation).) 80 g 1    venlafaxine  (EFFEXOR ) 37.5 MG tablet Take 1 tablet (37.5 mg total) by mouth 2 (two) times daily. 180 tablet 1    No current facility-administered medications for this visit.   Facility-Administered Medications Ordered in Other Visits  Medication Dose Route Frequency Provider Last Rate Last Admin   vancomycin  (VANCOCIN ) 1,000 mg in sodium chloride  0.9 % 500 mL IVPB  1,000 mg Intravenous Once Shadiyah Wernli S, PA-C       Allergies  Allergen Reactions   Sulfasalazine  Other (See Comments)    Flu like feelings   Statins Other (See Comments)    REACTION: Myalgia    Social History   Tobacco Use   Smoking status: Never   Smokeless tobacco: Never  Substance Use Topics   Alcohol  use: Yes    Alcohol /week: 1.0 standard drink of alcohol     Types: 1 Glasses of wine per week    Comment: occas    Family History  Problem Relation Age of Onset   Melanoma Father    Heart disease Father    Healthy Mother    Colon cancer Neg Hx    Esophageal cancer Neg  Hx       Review of Systems  Musculoskeletal:  Positive for arthralgias.  All other systems reviewed and are negative.    Objective:  Physical Exam Constitutional:      Appearance: Normal appearance.  HENT:     Head: Normocephalic and atraumatic.     Nose: Nose normal.     Mouth/Throat:     Mouth: Mucous membranes are moist.     Pharynx: Oropharynx is clear.  Eyes:     Conjunctiva/sclera: Conjunctivae normal.  Cardiovascular:     Rate and Rhythm: Normal rate and regular rhythm.     Pulses: Normal pulses.     Heart sounds: Normal heart sounds.  Pulmonary:     Effort: Pulmonary effort is normal.     Breath sounds: Normal breath sounds.  Abdominal:     General: Abdomen is flat.     Palpations: Abdomen is soft.  Genitourinary:    Comments: Deferred. Musculoskeletal:     Cervical  back: Normal range of motion and neck supple.     Comments: Examination of the right knee reveals well healed incision. No drainage or erythema. No wound dehiscence. Rash resolved. Small effusion noted. No warmth or erythema. No TTP. ROM 0 to 125 degrees. No instability, no extensor lag. Painless hip ROM.    Sensory and motor function intact in LE bilaterally. Distal pedal pulses 2+ bilaterally.   Skin:    General: Skin is warm and dry.     Capillary Refill: Capillary refill takes less than 2 seconds.  Neurological:     General: No focal deficit present.     Mental Status: He is alert and oriented to person, place, and time.  Psychiatric:        Mood and Affect: Mood normal.        Behavior: Behavior normal.        Thought Content: Thought content normal.        Judgment: Judgment normal.     Vital signs in last 24 hours: @VSRANGES @  Labs:  Estimated body mass index is 28.8 kg/m as calculated from the following:   Height as of 11/04/24: 5' 9 (1.753 m).   Weight as of 11/04/24: 88.5 kg.  Imaging Review Plain radiographs demonstrate  right knee articulating antibiotic spacer. The  overall alignment is neutral. The bone quality appears to be adequate for age and reported activity level.     Assessment/Plan:  End stage arthritis, right knee(s) with failed previous arthroplasty.    The patient history, physical examination, clinical judgment of the provider and imaging studies are consistent with end stage degenerative joint disease of the right knee(s), previous total knee arthroplasty. Revision total knee arthroplasty is deemed medically necessary. The treatment options including medical management, injection therapy, arthroscopy and revision arthroplasty were discussed at length. The risks and benefits of revision total knee arthroplasty were presented and reviewed. The risks due to aseptic loosening, infection, stiffness, patella tracking problems, thromboembolic complications and other imponderables were discussed. The patient acknowledged the explanation, agreed to proceed with the plan and consent was signed. Patient is being admitted for inpatient treatment for surgery, pain control, PT, OT, prophylactic antibiotics, VTE prophylaxis, progressive ambulation and ADL's and discharge planning.The patient is planning to be discharged home with OPPT after an overnight stay.    Therapy Plans: Outpatient physical therapy. [facility] will start 2 weeks after surgery at PO visit.  Disposition: Home with wife.  Planned DVT Prophylaxis: aspirin  81mg  BID.  DME needed: Has rolling walker. Has ice machine.  PCP: Cleared.  Cardiology: Cleared.  TXA: IV Allergies:  - Sulfa antibiotics - aches and pains.  - Sulfasalazine  - dizziness - Statins - diarrhea. Okay with rosuvastatin .  Anesthesia Concerns: None.  BMI: 28.8 Last HgbA1c: Not diabetic.  - Crohns disease, Remicade  infusions last dose in 10/17/24. He will hold Remicade  until he has adequate wound healing likely 4-6 weeks.  - Patient to continue topical steroids postoperative.  - 12 months of p.o.  antibiotics after surgery. He agrees to Ciprofloxacin  250mg  twice a day.  - Staples.** - Prevena incisional dressing. KI x2 weeks no bending. Then will start PT.  - Oxycodone , zofran , has meloxicam. - 11/04/24: Hgb 15.1, K+ 4.2, Cr. 1.28.

## 2024-11-10 NOTE — H&P (Signed)
 TOTAL KNEE REVISION ADMISSION H&P  Patient is being admitted for right revision total knee arthroplasty.  Subjective:  Chief Complaint:right knee pain.  HPI: Tyler Gates, 68 y.o. male, has a history of pain and functional disability in the right knee(s) due to periprosthetic joint infection, s/p resection of right total knee arthroplasty and placement of antibiotic spacer 10/01/23.  He has completed IV antibiotics with ID, current cultures negative. Onset of symptoms was gradual starting 1 years ago with stable course since that time.  Prior procedures on the right knee(s) include arthroplasty and right TKA resection and placement of antibiotic spacer.  Patient currently rates pain in the right knee(s) at 2 out of 10 with activity.  There is no current active infection.   Patient Active Problem List   Diagnosis Date Noted   Diarrhea 10/10/2023   Prosthetic joint infection 10/10/2023   Medication management 10/10/2023   Hypotension due to hypovolemia 10/10/2023   Infection of total right knee replacement, subsequent encounter 10/01/2023   Infection of prosthetic right knee joint 09/22/2023   Infection of total right knee replacement 08/27/2023   Rash 08/01/2023   Body aches 08/01/2023   Osteoarthritis of right knee 04/23/2023   Preoperative clearance 02/11/2023   Chronic sinusitis 03/12/2022   Visual disturbance 03/12/2022   Acute sinusitis 01/29/2022   Chronic kidney disease (CKD) stage G2/A2, mildly decreased glomerular filtration rate (GFR) between 60-89 mL/min/1.73 square meter and albuminuria creatinine ratio between 30-299 mg/g 10/22/2018   Hypogonadism male 10/21/2018   Screening PSA (prostate specific antigen) 10/21/2018   Chronic kidney disease due to benign hypertension 10/21/2018   Depression, major, recurrent, in remission 10/21/2018   Statin intolerance 09/14/2018   Agatston coronary artery calcium  score greater than 400 08/10/2018   Family history of coronary artery  disease 08/10/2018   Crohn's disease of colon without complication (HCC) 05/09/2017   Mucosal abnormality of esophagus    Hiatal hernia    KNEE JOINT INSTABILITY 03/07/2007   Hyperlipidemia 12/30/2006   Essential hypertension 12/30/2006   GERD 12/30/2006   Past Medical History:  Diagnosis Date   Arthritis    Crohn's disease (HCC)    Depression    Dyspnea on exertion    GERD (gastroesophageal reflux disease)    Herpes    History of kidney stones    Hyperlipidemia    Hypertension    Instability of knee joint    Left displaced femoral neck fracture (HCC) 07/11/2017   OSA (obstructive sleep apnea)    mild   Snoring     Past Surgical History:  Procedure Laterality Date   COLONOSCOPY  2007   Dr. Zena normal TI, ascending colon/cecum with mild inflammation noted but biopses without signs of active inflammation, hyperpastic polyp, possible ischemia on path of left colon biopsy   COLONOSCOPY N/A 11/08/2015   Procedure: COLONOSCOPY;  Surgeon: Lamar CHRISTELLA Hollingshead, MD;  Location: [facility];  Service: Endoscopy;  Laterality: N/A;   ESOPHAGOGASTRODUODENOSCOPY  2007   Dr. Zena reflux esophagitis, hiatal hernia, negative H.pylori   ESOPHAGOGASTRODUODENOSCOPY N/A 11/08/2015   Procedure: ESOPHAGOGASTRODUODENOSCOPY (EGD);  Surgeon: Lamar CHRISTELLA Hollingshead, MD;  Location: [facility];  Service: Endoscopy;  Laterality: N/A;  0830 - moved to 8:15 - office to notify   EXCISIONAL TOTAL KNEE ARTHROPLASTY WITH ANTIBIOTIC SPACERS Right 10/01/2023   Procedure: EXCISIONAL TOTAL KNEE ARTHROPLASTY WITH PLACEMENT OF ARTICULATING ANTIBIOTIC SPACER;  Surgeon: Fidel Rogue, MD;  Location: [facility];  Service: Orthopedics;  Laterality: Right;   HIP PINNING,CANNULATED  Left 07/12/2017   Procedure: CANNULATED HIP PINNING LEFT;  Surgeon: Fidel Rogue, MD;  Location: [facility];  Service: Orthopedics;  Laterality: Left;   KNEE ARTHROPLASTY Right 04/23/2023   Procedure: COMPUTER ASSISTED TOTAL KNEE ARTHROPLASTY;  Surgeon: Fidel Rogue, MD;  Location: [facility];  Service: Orthopedics;  Laterality: Right;  160    Current Outpatient Medications  Medication Sig Dispense Refill Last Dose/Taking   amLODipine  (NORVASC ) 5 MG tablet TAKE 2 TABLETS BY MOUTH EVERY DAY 120 tablet 0    ezetimibe  (ZETIA ) 10 MG tablet TAKE 1 TABLET BY MOUTH EVERY DAY 15 tablet 0    inFLIXimab  (REMICADE ) 100 MG injection INFUSE 400MG  IV EVERY 8 WEEKS      meloxicam (MOBIC) 15 MG tablet Take 15 mg by mouth in the morning.      olmesartan  (BENICAR ) 20 MG tablet TAKE 1 TABLET BY MOUTH EVERY DAY 90 tablet 3    omeprazole  (PRILOSEC) 40 MG capsule TAKE 1 CAPSULE (40 MG TOTAL) BY MOUTH IN THE MORNING AND AT BEDTIME. 180 capsule 3    rosuvastatin  (CRESTOR ) 20 MG tablet TAKE 1 TABLET BY MOUTH EVERY DAY 90 tablet 3    triamcinolone  ointment (KENALOG ) 0.1 % Apply 1 Application topically 2 (two) times daily. (Patient taking differently: Apply 1 Application topically 2 (two) times daily as needed (irritation).) 80 g 1    venlafaxine  (EFFEXOR ) 37.5 MG tablet Take 1 tablet (37.5 mg total) by mouth 2 (two) times daily. 180 tablet 1    No current facility-administered medications for this visit.   Facility-Administered Medications Ordered in Other Visits  Medication Dose Route Frequency Provider Last Rate Last Admin   vancomycin  (VANCOCIN ) 1,000 mg in sodium chloride  0.9 % 500 mL IVPB  1,000 mg Intravenous Once Shadiyah Wernli S, PA-C       Allergies  Allergen Reactions   Sulfasalazine  Other (See Comments)    Flu like feelings   Statins Other (See Comments)    REACTION: Myalgia    Social History   Tobacco Use   Smoking status: Never   Smokeless tobacco: Never  Substance Use Topics   Alcohol  use: Yes    Alcohol /week: 1.0 standard drink of alcohol     Types: 1 Glasses of wine per week    Comment: occas    Family History  Problem Relation Age of Onset   Melanoma Father    Heart disease Father    Healthy Mother    Colon cancer Neg Hx    Esophageal cancer Neg  Hx       Review of Systems  Musculoskeletal:  Positive for arthralgias.  All other systems reviewed and are negative.    Objective:  Physical Exam Constitutional:      Appearance: Normal appearance.  HENT:     Head: Normocephalic and atraumatic.     Nose: Nose normal.     Mouth/Throat:     Mouth: Mucous membranes are moist.     Pharynx: Oropharynx is clear.  Eyes:     Conjunctiva/sclera: Conjunctivae normal.  Cardiovascular:     Rate and Rhythm: Normal rate and regular rhythm.     Pulses: Normal pulses.     Heart sounds: Normal heart sounds.  Pulmonary:     Effort: Pulmonary effort is normal.     Breath sounds: Normal breath sounds.  Abdominal:     General: Abdomen is flat.     Palpations: Abdomen is soft.  Genitourinary:    Comments: Deferred. Musculoskeletal:     Cervical  back: Normal range of motion and neck supple.     Comments: Examination of the right knee reveals well healed incision. No drainage or erythema. No wound dehiscence. Rash resolved. Small effusion noted. No warmth or erythema. No TTP. ROM 0 to 125 degrees. No instability, no extensor lag. Painless hip ROM.    Sensory and motor function intact in LE bilaterally. Distal pedal pulses 2+ bilaterally.   Skin:    General: Skin is warm and dry.     Capillary Refill: Capillary refill takes less than 2 seconds.  Neurological:     General: No focal deficit present.     Mental Status: He is alert and oriented to person, place, and time.  Psychiatric:        Mood and Affect: Mood normal.        Behavior: Behavior normal.        Thought Content: Thought content normal.        Judgment: Judgment normal.     Vital signs in last 24 hours: @VSRANGES @  Labs:  Estimated body mass index is 28.8 kg/m as calculated from the following:   Height as of 11/04/24: 5' 9 (1.753 m).   Weight as of 11/04/24: 88.5 kg.  Imaging Review Plain radiographs demonstrate  right knee articulating antibiotic spacer. The  overall alignment is neutral. The bone quality appears to be adequate for age and reported activity level.     Assessment/Plan:  End stage arthritis, right knee(s) with failed previous arthroplasty.    The patient history, physical examination, clinical judgment of the provider and imaging studies are consistent with end stage degenerative joint disease of the right knee(s), previous total knee arthroplasty. Revision total knee arthroplasty is deemed medically necessary. The treatment options including medical management, injection therapy, arthroscopy and revision arthroplasty were discussed at length. The risks and benefits of revision total knee arthroplasty were presented and reviewed. The risks due to aseptic loosening, infection, stiffness, patella tracking problems, thromboembolic complications and other imponderables were discussed. The patient acknowledged the explanation, agreed to proceed with the plan and consent was signed. Patient is being admitted for inpatient treatment for surgery, pain control, PT, OT, prophylactic antibiotics, VTE prophylaxis, progressive ambulation and ADL's and discharge planning.The patient is planning to be discharged home with OPPT after an overnight stay.    Therapy Plans: Outpatient physical therapy. [facility] will start 2 weeks after surgery at PO visit.  Disposition: Home with wife.  Planned DVT Prophylaxis: aspirin  81mg  BID.  DME needed: Has rolling walker. Has ice machine.  PCP: Cleared.  Cardiology: Cleared.  TXA: IV Allergies:  - Sulfa antibiotics - aches and pains.  - Sulfasalazine  - dizziness - Statins - diarrhea. Okay with rosuvastatin .  Anesthesia Concerns: None.  BMI: 28.8 Last HgbA1c: Not diabetic.  - Crohns disease, Remicade  infusions last dose in 10/17/24. He will hold Remicade  until he has adequate wound healing likely 4-6 weeks.  - Patient to continue topical steroids postoperative.  - 12 months of p.o.  antibiotics after surgery. He agrees to Ciprofloxacin  250mg  twice a day.  - Staples.** - Prevena incisional dressing. KI x2 weeks no bending. Then will start PT.  - Oxycodone , zofran , has meloxicam. - 11/04/24: Hgb 15.1, K+ 4.2, Cr. 1.28.

## 2024-11-11 ENCOUNTER — Inpatient Hospital Stay (HOSPITAL_COMMUNITY)

## 2024-11-11 ENCOUNTER — Encounter (HOSPITAL_COMMUNITY): Payer: Self-pay | Admitting: Orthopedic Surgery

## 2024-11-11 ENCOUNTER — Inpatient Hospital Stay (HOSPITAL_COMMUNITY)
Admission: RE | Admit: 2024-11-11 | Discharge: 2024-11-12 | DRG: 467 | Disposition: A | Discharge status: Home / Self Care | Attending: Orthopedic Surgery | Admitting: Orthopedic Surgery

## 2024-11-11 ENCOUNTER — Other Ambulatory Visit: Payer: Self-pay

## 2024-11-11 ENCOUNTER — Inpatient Hospital Stay (HOSPITAL_COMMUNITY): Payer: Self-pay | Admitting: Anesthesiology

## 2024-11-11 ENCOUNTER — Inpatient Hospital Stay (HOSPITAL_COMMUNITY): Payer: Self-pay | Admitting: Medical

## 2024-11-11 ENCOUNTER — Encounter (HOSPITAL_COMMUNITY)
Admission: RE | Disposition: A | Discharge status: Home / Self Care | Payer: Self-pay | Source: Home / Self Care | Attending: Orthopedic Surgery

## 2024-11-11 DIAGNOSIS — I2510 Atherosclerotic heart disease of native coronary artery without angina pectoris: Secondary | ICD-10-CM | POA: Diagnosis present

## 2024-11-11 DIAGNOSIS — M1711 Unilateral primary osteoarthritis, right knee: Secondary | ICD-10-CM | POA: Diagnosis present

## 2024-11-11 DIAGNOSIS — Z96651 Presence of right artificial knee joint: Secondary | ICD-10-CM | POA: Diagnosis not present

## 2024-11-11 DIAGNOSIS — Z882 Allergy status to sulfonamides status: Secondary | ICD-10-CM

## 2024-11-11 DIAGNOSIS — Z87442 Personal history of urinary calculi: Secondary | ICD-10-CM

## 2024-11-11 DIAGNOSIS — Z79899 Other long term (current) drug therapy: Secondary | ICD-10-CM

## 2024-11-11 DIAGNOSIS — Z8249 Family history of ischemic heart disease and other diseases of the circulatory system: Secondary | ICD-10-CM

## 2024-11-11 DIAGNOSIS — G8918 Other acute postprocedural pain: Secondary | ICD-10-CM | POA: Diagnosis not present

## 2024-11-11 DIAGNOSIS — M25461 Effusion, right knee: Secondary | ICD-10-CM | POA: Diagnosis not present

## 2024-11-11 DIAGNOSIS — Y831 Surgical operation with implant of artificial internal device as the cause of abnormal reaction of the patient, or of later complication, without mention of misadventure at the time of the procedure: Secondary | ICD-10-CM | POA: Diagnosis present

## 2024-11-11 DIAGNOSIS — K5010 Crohn's disease of large intestine without complications: Secondary | ICD-10-CM | POA: Diagnosis present

## 2024-11-11 DIAGNOSIS — R944 Abnormal results of kidney function studies: Secondary | ICD-10-CM | POA: Diagnosis present

## 2024-11-11 DIAGNOSIS — T8459XA Infection and inflammatory reaction due to other internal joint prosthesis, initial encounter: Secondary | ICD-10-CM | POA: Diagnosis not present

## 2024-11-11 DIAGNOSIS — G4733 Obstructive sleep apnea (adult) (pediatric): Secondary | ICD-10-CM | POA: Diagnosis present

## 2024-11-11 DIAGNOSIS — Z96659 Presence of unspecified artificial knee joint: Secondary | ICD-10-CM | POA: Diagnosis not present

## 2024-11-11 DIAGNOSIS — Z808 Family history of malignant neoplasm of other organs or systems: Secondary | ICD-10-CM | POA: Diagnosis not present

## 2024-11-11 DIAGNOSIS — R609 Edema, unspecified: Secondary | ICD-10-CM | POA: Diagnosis not present

## 2024-11-11 DIAGNOSIS — Z79620 Long term (current) use of immunosuppressive biologic: Secondary | ICD-10-CM | POA: Diagnosis not present

## 2024-11-11 DIAGNOSIS — E785 Hyperlipidemia, unspecified: Secondary | ICD-10-CM | POA: Diagnosis not present

## 2024-11-11 DIAGNOSIS — T8453XA Infection and inflammatory reaction due to internal right knee prosthesis, initial encounter: Secondary | ICD-10-CM | POA: Diagnosis not present

## 2024-11-11 DIAGNOSIS — I129 Hypertensive chronic kidney disease with stage 1 through stage 4 chronic kidney disease, or unspecified chronic kidney disease: Secondary | ICD-10-CM | POA: Diagnosis present

## 2024-11-11 DIAGNOSIS — Z888 Allergy status to other drugs, medicaments and biological substances status: Secondary | ICD-10-CM

## 2024-11-11 DIAGNOSIS — N182 Chronic kidney disease, stage 2 (mild): Secondary | ICD-10-CM | POA: Diagnosis present

## 2024-11-11 DIAGNOSIS — T8459XD Infection and inflammatory reaction due to other internal joint prosthesis, subsequent encounter: Secondary | ICD-10-CM | POA: Diagnosis present

## 2024-11-11 HISTORY — PX: TOTAL KNEE REVISION: SHX996

## 2024-11-11 LAB — TYPE AND SCREEN
ABO/RH(D): O POS
Antibody Screen: NEGATIVE

## 2024-11-11 SURGERY — TOTAL KNEE REVISION
Anesthesia: General | Site: Knee | Laterality: Right

## 2024-11-11 MED ORDER — POVIDONE-IODINE 10 % EX SWAB: 2.0000 | Freq: Once | CUTANEOUS | Status: DC

## 2024-11-11 MED ORDER — ONDANSETRON HCL 4 MG/2ML IJ SOLN
INTRAMUSCULAR | Status: DC | PRN
  Administered 2024-11-11: 4 mg via INTRAVENOUS

## 2024-11-11 MED ORDER — FENTANYL CITRATE (PF) 100 MCG/2ML IJ SOLN
INTRAMUSCULAR | Status: AC
  Filled 2024-11-11: qty 2

## 2024-11-11 MED ORDER — VANCOMYCIN HCL 1000 MG IV SOLR
INTRAVENOUS | Status: AC
  Filled 2024-11-11: qty 120

## 2024-11-11 MED ORDER — OXYCODONE HCL 5 MG PO TABS: 10.0000 mg | ORAL_TABLET | ORAL | Status: DC | PRN

## 2024-11-11 MED ORDER — FENTANYL CITRATE (PF) 50 MCG/ML IJ SOSY
25.0000 ug | PREFILLED_SYRINGE | INTRAMUSCULAR | Status: DC | PRN
  Administered 2024-11-11: 25 ug via INTRAVENOUS

## 2024-11-11 MED ORDER — PROPOFOL 1000 MG/100ML IV EMUL
INTRAVENOUS | Status: AC
  Filled 2024-11-11: qty 100

## 2024-11-11 MED ORDER — EZETIMIBE 10 MG PO TABS
10.0000 mg | ORAL_TABLET | Freq: Every day | ORAL | Status: DC
  Administered 2024-11-12: 10 mg via ORAL
  Filled 2024-11-11: qty 1

## 2024-11-11 MED ORDER — BUPIVACAINE HCL (PF) 0.25 % IJ SOLN
INTRAMUSCULAR | Status: DC | PRN
  Administered 2024-11-11: 20 mL via PERINEURAL

## 2024-11-11 MED ORDER — PHENOL 1.4 % MT LIQD: 1.0000 | OROMUCOSAL | Status: DC | PRN

## 2024-11-11 MED ORDER — EPHEDRINE SULFATE-NACL 50-0.9 MG/10ML-% IV SOSY
PREFILLED_SYRINGE | INTRAVENOUS | Status: DC | PRN
  Administered 2024-11-11: 10 mg via INTRAVENOUS

## 2024-11-11 MED ORDER — DEXAMETHASONE SOD PHOSPHATE PF 10 MG/ML IJ SOLN
INTRAMUSCULAR | Status: DC | PRN
  Administered 2024-11-11: 10 mg via INTRAVENOUS

## 2024-11-11 MED ORDER — CEFAZOLIN SODIUM-DEXTROSE 2-4 GM/100ML-% IV SOLN
2.0000 g | Freq: Four times a day (QID) | INTRAVENOUS | Status: AC
  Administered 2024-11-11 (×2): 2 g via INTRAVENOUS
  Filled 2024-11-11 (×2): qty 100

## 2024-11-11 MED ORDER — LACTATED RINGERS IV SOLN: INTRAVENOUS | Status: DC

## 2024-11-11 MED ORDER — EPHEDRINE 5 MG/ML INJ
INTRAVENOUS | Status: AC
  Filled 2024-11-11: qty 5

## 2024-11-11 MED ORDER — SUGAMMADEX SODIUM 200 MG/2ML IV SOLN
INTRAVENOUS | Status: DC | PRN
  Administered 2024-11-11: 200 mg via INTRAVENOUS

## 2024-11-11 MED ORDER — METOCLOPRAMIDE HCL 5 MG/ML IJ SOLN: 5.0000 mg | Freq: Three times a day (TID) | INTRAMUSCULAR | Status: DC | PRN

## 2024-11-11 MED ORDER — DIPHENHYDRAMINE HCL 12.5 MG/5ML PO ELIX: 12.5000 mg | ORAL_SOLUTION | ORAL | Status: DC | PRN

## 2024-11-11 MED ORDER — VENLAFAXINE HCL 37.5 MG PO TABS
37.5000 mg | ORAL_TABLET | Freq: Two times a day (BID) | ORAL | Status: DC
  Administered 2024-11-11 – 2024-11-12 (×2): 37.5 mg via ORAL
  Filled 2024-11-11 (×3): qty 1

## 2024-11-11 MED ORDER — DOCUSATE SODIUM 100 MG PO CAPS
100.0000 mg | ORAL_CAPSULE | Freq: Two times a day (BID) | ORAL | Status: DC
  Administered 2024-11-11 – 2024-11-12 (×2): 100 mg via ORAL
  Filled 2024-11-11 (×2): qty 1

## 2024-11-11 MED ORDER — FENTANYL CITRATE (PF) 50 MCG/ML IJ SOSY
PREFILLED_SYRINGE | INTRAMUSCULAR | Status: AC
  Filled 2024-11-11: qty 1

## 2024-11-11 MED ORDER — SODIUM CHLORIDE 0.9 % IV SOLN: INTRAVENOUS | Status: DC

## 2024-11-11 MED ORDER — ISOPROPYL ALCOHOL 70 % SOLN
Status: DC | PRN
  Administered 2024-11-11: 1 via TOPICAL

## 2024-11-11 MED ORDER — LIDOCAINE HCL (CARDIAC) PF 100 MG/5ML IV SOSY
PREFILLED_SYRINGE | INTRAVENOUS | Status: DC | PRN
  Administered 2024-11-11: 80 mg via INTRAVENOUS

## 2024-11-11 MED ORDER — CEFAZOLIN SODIUM-DEXTROSE 2-4 GM/100ML-% IV SOLN
2.0000 g | INTRAVENOUS | Status: AC
  Administered 2024-11-11: 2 g via INTRAVENOUS
  Filled 2024-11-11: qty 100

## 2024-11-11 MED ORDER — ALBUMIN HUMAN 5 % IV SOLN
INTRAVENOUS | Status: AC
  Filled 2024-11-11: qty 250

## 2024-11-11 MED ORDER — VANCOMYCIN HCL 1000 MG IV SOLR
INTRAVENOUS | Status: DC | PRN
  Administered 2024-11-11: 5 g

## 2024-11-11 MED ORDER — MIDAZOLAM HCL 2 MG/2ML IJ SOLN
INTRAMUSCULAR | Status: AC
  Filled 2024-11-11: qty 2

## 2024-11-11 MED ORDER — HYDROMORPHONE HCL 1 MG/ML IJ SOLN: 0.5000 mg | INTRAMUSCULAR | Status: DC | PRN

## 2024-11-11 MED ORDER — ORAL CARE MOUTH RINSE: 15.0000 mL | Freq: Once | OROMUCOSAL | Status: AC

## 2024-11-11 MED ORDER — METHOCARBAMOL 500 MG PO TABS
500.0000 mg | ORAL_TABLET | Freq: Four times a day (QID) | ORAL | Status: DC | PRN
  Administered 2024-11-11 – 2024-11-12 (×2): 500 mg via ORAL
  Filled 2024-11-11 (×2): qty 1

## 2024-11-11 MED ORDER — PROPOFOL 10 MG/ML IV BOLUS
INTRAVENOUS | Status: AC
  Filled 2024-11-11: qty 20

## 2024-11-11 MED ORDER — ONDANSETRON HCL 4 MG/2ML IJ SOLN: 4.0000 mg | Freq: Four times a day (QID) | INTRAMUSCULAR | Status: DC | PRN

## 2024-11-11 MED ORDER — ALBUMIN HUMAN 5 % IV SOLN: INTRAVENOUS | Status: DC | PRN

## 2024-11-11 MED ORDER — OXYCODONE HCL 5 MG PO TABS: 5.0000 mg | ORAL_TABLET | Freq: Once | ORAL | Status: DC | PRN

## 2024-11-11 MED ORDER — SENNA 8.6 MG PO TABS
1.0000 | ORAL_TABLET | Freq: Two times a day (BID) | ORAL | Status: DC
  Administered 2024-11-12: 8.6 mg via ORAL
  Filled 2024-11-11 (×2): qty 1

## 2024-11-11 MED ORDER — CIPROFLOXACIN HCL 500 MG PO TABS
250.0000 mg | ORAL_TABLET | Freq: Two times a day (BID) | ORAL | Status: DC
  Administered 2024-11-11 – 2024-11-12 (×2): 250 mg via ORAL
  Filled 2024-11-11 (×2): qty 1

## 2024-11-11 MED ORDER — PROPOFOL 10 MG/ML IV BOLUS
INTRAVENOUS | Status: DC | PRN
  Administered 2024-11-11: 160 mg via INTRAVENOUS

## 2024-11-11 MED ORDER — LIDOCAINE HCL (PF) 2 % IJ SOLN
INTRAMUSCULAR | Status: AC
  Filled 2024-11-11: qty 5

## 2024-11-11 MED ORDER — MENTHOL 3 MG MT LOZG: 1.0000 | LOZENGE | OROMUCOSAL | Status: DC | PRN

## 2024-11-11 MED ORDER — ONDANSETRON HCL 4 MG PO TABS: 4.0000 mg | ORAL_TABLET | Freq: Four times a day (QID) | ORAL | Status: DC | PRN

## 2024-11-11 MED ORDER — ASPIRIN 81 MG PO CHEW
81.0000 mg | CHEWABLE_TABLET | Freq: Two times a day (BID) | ORAL | Status: DC
  Administered 2024-11-11 – 2024-11-12 (×2): 81 mg via ORAL
  Filled 2024-11-11 (×2): qty 1

## 2024-11-11 MED ORDER — ONDANSETRON HCL 4 MG/2ML IJ SOLN
INTRAMUSCULAR | Status: AC
  Filled 2024-11-11: qty 4

## 2024-11-11 MED ORDER — ISOPROPYL ALCOHOL 70 % SOLN
Status: AC
  Filled 2024-11-11: qty 480

## 2024-11-11 MED ORDER — CHLORHEXIDINE GLUCONATE 0.12 % MT SOLN
15.0000 mL | Freq: Once | OROMUCOSAL | Status: AC
  Administered 2024-11-11: 15 mL via OROMUCOSAL

## 2024-11-11 MED ORDER — ROSUVASTATIN CALCIUM 20 MG PO TABS
20.0000 mg | ORAL_TABLET | Freq: Every day | ORAL | Status: DC
  Administered 2024-11-12: 20 mg via ORAL
  Filled 2024-11-11: qty 1

## 2024-11-11 MED ORDER — ACETAMINOPHEN 10 MG/ML IV SOLN: 1000.0000 mg | Freq: Once | INTRAVENOUS | Status: DC | PRN

## 2024-11-11 MED ORDER — PHENYLEPHRINE HCL-NACL 20-0.9 MG/250ML-% IV SOLN
INTRAVENOUS | Status: DC | PRN
  Administered 2024-11-11: 25 ug/min via INTRAVENOUS

## 2024-11-11 MED ORDER — SUGAMMADEX SODIUM 200 MG/2ML IV SOLN
INTRAVENOUS | Status: AC
  Filled 2024-11-11: qty 2

## 2024-11-11 MED ORDER — METHOCARBAMOL 1000 MG/10ML IJ SOLN: 500.0000 mg | Freq: Four times a day (QID) | INTRAMUSCULAR | Status: DC | PRN

## 2024-11-11 MED ORDER — 0.9 % SODIUM CHLORIDE (POUR BTL) OPTIME
TOPICAL | Status: DC | PRN
  Administered 2024-11-11: 1000 mL

## 2024-11-11 MED ORDER — ROCURONIUM BROMIDE 10 MG/ML (PF) SYRINGE
PREFILLED_SYRINGE | INTRAVENOUS | Status: AC
  Filled 2024-11-11: qty 10

## 2024-11-11 MED ORDER — TRIAMCINOLONE ACETONIDE 0.1 % EX OINT: 1.0000 | TOPICAL_OINTMENT | Freq: Two times a day (BID) | CUTANEOUS | Status: DC | PRN

## 2024-11-11 MED ORDER — MIDAZOLAM HCL (PF) 2 MG/2ML IJ SOLN
INTRAMUSCULAR | Status: DC | PRN
  Administered 2024-11-11: 2 mg via INTRAVENOUS

## 2024-11-11 MED ORDER — PANTOPRAZOLE SODIUM 40 MG PO TBEC
40.0000 mg | DELAYED_RELEASE_TABLET | Freq: Every day | ORAL | Status: DC
  Administered 2024-11-11 – 2024-11-12 (×2): 40 mg via ORAL
  Filled 2024-11-11 (×2): qty 1

## 2024-11-11 MED ORDER — ACETAMINOPHEN 325 MG PO TABS: 325.0000 mg | ORAL_TABLET | Freq: Four times a day (QID) | ORAL | Status: DC | PRN

## 2024-11-11 MED ORDER — STERILE WATER FOR IRRIGATION IR SOLN
Status: DC | PRN
Start: 2024-11-11 — End: 2024-11-11
  Administered 2024-11-11: 2000 mL

## 2024-11-11 MED ORDER — BISACODYL 10 MG RE SUPP: 10.0000 mg | Freq: Every day | RECTAL | Status: DC | PRN

## 2024-11-11 MED ORDER — VANCOMYCIN HCL IN DEXTROSE 1-5 GM/200ML-% IV SOLN
INTRAVENOUS | Status: AC
  Filled 2024-11-11: qty 200

## 2024-11-11 MED ORDER — ACETAMINOPHEN 500 MG PO TABS
1000.0000 mg | ORAL_TABLET | Freq: Once | ORAL | Status: AC
  Administered 2024-11-11: 1000 mg via ORAL
  Filled 2024-11-11: qty 2

## 2024-11-11 MED ORDER — HYDROMORPHONE HCL 1 MG/ML IJ SOLN
0.5000 mg | INTRAMUSCULAR | Status: DC | PRN
  Administered 2024-11-11: 1 mg via INTRAVENOUS
  Filled 2024-11-11: qty 1

## 2024-11-11 MED ORDER — SODIUM CHLORIDE (PF) 0.9 % IJ SOLN
INTRAMUSCULAR | Status: AC
Start: 2024-11-11 — End: 2024-11-11
  Filled 2024-11-11: qty 10

## 2024-11-11 MED ORDER — ROCURONIUM BROMIDE 100 MG/10ML IV SOLN
INTRAVENOUS | Status: DC | PRN
  Administered 2024-11-11: 100 mg via INTRAVENOUS

## 2024-11-11 MED ORDER — FENTANYL CITRATE (PF) 100 MCG/2ML IJ SOLN
INTRAMUSCULAR | Status: DC | PRN
  Administered 2024-11-11 (×2): 50 ug via INTRAVENOUS
  Administered 2024-11-11: 25 ug via INTRAVENOUS
  Administered 2024-11-11: 75 ug via INTRAVENOUS

## 2024-11-11 MED ORDER — PHENYLEPHRINE 80 MCG/ML (10ML) SYRINGE FOR IV PUSH (FOR BLOOD PRESSURE SUPPORT)
PREFILLED_SYRINGE | INTRAVENOUS | Status: AC
  Filled 2024-11-11: qty 20

## 2024-11-11 MED ORDER — POLYETHYLENE GLYCOL 3350 17 G PO PACK
17.0000 g | PACK | Freq: Every day | ORAL | Status: DC | PRN
Start: 2024-11-11 — End: 2024-11-12

## 2024-11-11 MED ORDER — SODIUM CHLORIDE 0.9 % IR SOLN
Status: DC | PRN
  Administered 2024-11-11: 3000 mL

## 2024-11-11 MED ORDER — LIDOCAINE HCL (PF) 2 % IJ SOLN
INTRAMUSCULAR | Status: AC
  Filled 2024-11-11: qty 10

## 2024-11-11 MED ORDER — METOCLOPRAMIDE HCL 5 MG PO TABS: 5.0000 mg | ORAL_TABLET | Freq: Three times a day (TID) | ORAL | Status: DC | PRN

## 2024-11-11 MED ORDER — OXYCODONE HCL 5 MG/5ML PO SOLN: 5.0000 mg | Freq: Once | ORAL | Status: DC | PRN

## 2024-11-11 MED ORDER — ALUM & MAG HYDROXIDE-SIMETH 200-200-20 MG/5ML PO SUSP
30.0000 mL | ORAL | Status: DC | PRN
Start: 2024-11-11 — End: 2024-11-12
  Administered 2024-11-11 – 2024-11-12 (×2): 30 mL via ORAL
  Filled 2024-11-11 (×2): qty 30

## 2024-11-11 MED ORDER — ACETAMINOPHEN 500 MG PO TABS
1000.0000 mg | ORAL_TABLET | Freq: Four times a day (QID) | ORAL | Status: AC
  Administered 2024-11-11 – 2024-11-12 (×4): 1000 mg via ORAL
  Filled 2024-11-11 (×4): qty 2

## 2024-11-11 MED ORDER — TRANEXAMIC ACID-NACL 1000-0.7 MG/100ML-% IV SOLN
1000.0000 mg | INTRAVENOUS | Status: AC
  Administered 2024-11-11: 1000 mg via INTRAVENOUS
  Filled 2024-11-11: qty 100

## 2024-11-11 MED ORDER — HYDROMORPHONE HCL 2 MG/ML IJ SOLN
INTRAMUSCULAR | Status: AC
  Filled 2024-11-11: qty 1

## 2024-11-11 MED ORDER — DROPERIDOL 2.5 MG/ML IJ SOLN: 0.6250 mg | Freq: Once | INTRAMUSCULAR | Status: DC | PRN

## 2024-11-11 MED ORDER — OXYCODONE HCL 5 MG PO TABS
5.0000 mg | ORAL_TABLET | ORAL | Status: DC | PRN
  Administered 2024-11-11: 10 mg via ORAL
  Administered 2024-11-12: 5 mg via ORAL
  Filled 2024-11-11 (×2): qty 2

## 2024-11-11 MED ORDER — HYDROMORPHONE HCL 1 MG/ML IJ SOLN
INTRAMUSCULAR | Status: DC | PRN
  Administered 2024-11-11: 1 mg via INTRAVENOUS
  Administered 2024-11-11 (×2): .5 mg via INTRAVENOUS

## 2024-11-11 SURGICAL SUPPLY — 75 items
AUG TIB HALF BLOCK PS EF 5 RL (Joint) IMPLANT
AUGMENT FEM DIST KNEE REV 9 5 (Joint) IMPLANT
AUGMENT FEM TIB PS KNEE 9 5 (Joint) IMPLANT
AUGMENT TIB HLF BLC KNEE EF5RT (Joint) IMPLANT
BAG COUNTER SPONGE SURGICOUNT (BAG) IMPLANT
BAG ZIPLOCK 12X15 (MISCELLANEOUS) ×2 IMPLANT
BLADE SAGITTAL 11X90X1.07 (BLADE) IMPLANT
BLADE SAGITTAL AGGR TOOTH XLG (BLADE) ×2 IMPLANT
BLADE SAW SGTL 81X20 HD (BLADE) ×2 IMPLANT
BLADE SURG SZ10 CARB STEEL (BLADE) ×2 IMPLANT
BNDG ELASTIC 4INX 5YD STR LF (GAUZE/BANDAGES/DRESSINGS) ×2 IMPLANT
BNDG ELASTIC 6INX 5YD STR LF (GAUZE/BANDAGES/DRESSINGS) ×2 IMPLANT
CEMENT BONE R 1X40 (Cement) IMPLANT
CEMENT RESTRICTOR BONE PREP ST (Cement) IMPLANT
CHLORAPREP W/TINT 26 (MISCELLANEOUS) ×4 IMPLANT
COMPONENT FEM CMT SZ9+ RT (Miscellaneous) IMPLANT
COMPONENT TIB FIXED CMT E RT (Joint) IMPLANT
COVER SURGICAL LIGHT HANDLE (MISCELLANEOUS) ×2 IMPLANT
CUFF TRNQT CYL 34X4.125X (TOURNIQUET CUFF) ×2 IMPLANT
DERMABOND ADVANCED .7 DNX12 (GAUZE/BANDAGES/DRESSINGS) ×4 IMPLANT
DRAPE INCISE IOBAN 66X45 STRL (DRAPES) IMPLANT
DRAPE SHEET LG 3/4 BI-LAMINATE (DRAPES) ×6 IMPLANT
DRAPE U-SHAPE 47X51 STRL (DRAPES) ×2 IMPLANT
DRESSING PREVENA PLUS CUSTOM (GAUZE/BANDAGES/DRESSINGS) IMPLANT
DRSG AQUACEL AG ADV 3.5X10 (GAUZE/BANDAGES/DRESSINGS) IMPLANT
DRSG AQUACEL AG ADV 3.5X14 (GAUZE/BANDAGES/DRESSINGS) IMPLANT
DRSG TEGADERM 4X4.75 (GAUZE/BANDAGES/DRESSINGS) IMPLANT
ELECT BLADE TIP CTD 4 INCH (ELECTRODE) ×2 IMPLANT
ELECT PENCIL ROCKER SW 15FT (MISCELLANEOUS) ×2 IMPLANT
ELECT REM PT RETURN 15FT ADLT (MISCELLANEOUS) ×2 IMPLANT
EVACUATOR 1/8 PVC DRAIN (DRAIN) IMPLANT
EVACUATOR DRAINAGE 10X20 100CC (DRAIN) IMPLANT
GAUZE SPONGE 2X2 8PLY STRL LF (GAUZE/BANDAGES/DRESSINGS) IMPLANT
GAUZE SPONGE 4X4 12PLY STRL (GAUZE/BANDAGES/DRESSINGS) IMPLANT
GLOVE BIO SURGEON STRL SZ7 (GLOVE) ×4 IMPLANT
GLOVE BIO SURGEON STRL SZ8.5 (GLOVE) ×4 IMPLANT
GLOVE BIOGEL PI IND STRL 7.5 (GLOVE) ×2 IMPLANT
GLOVE BIOGEL PI IND STRL 8.5 (GLOVE) ×2 IMPLANT
GOWN SPEC L3 XXLG W/TWL (GOWN DISPOSABLE) ×2 IMPLANT
GOWN STRL REUS W/ TWL XL LVL3 (GOWN DISPOSABLE) ×2 IMPLANT
HOLDER FOLEY CATH W/STRAP (MISCELLANEOUS) IMPLANT
HOOD PEEL AWAY T7 (MISCELLANEOUS) ×6 IMPLANT
IMMOBILIZER KNEE 20 THIGH 36 (SOFTGOODS) IMPLANT
INSERT PSN CPS VE RIGHT 12M (Insert) IMPLANT
INSERT TIB PS CMTLS CC XSM (Insert) IMPLANT
INSTRUMENT SCRW HEX REV 3.5X48 (ORTHOPEDIC DISPOSABLE SUPPLIES) IMPLANT
KIT STIMULAN RAPID CURE 10CC (Orthopedic Implant) IMPLANT
KIT TURNOVER KIT A (KITS) ×2 IMPLANT
MANIFOLD NEPTUNE II (INSTRUMENTS) ×2 IMPLANT
MARKER SKIN DUAL TIP RULER LAB (MISCELLANEOUS) ×2 IMPLANT
PACK TOTAL KNEE CUSTOM (KITS) ×2 IMPLANT
PADDING CAST COTTON 6X4 STRL (CAST SUPPLIES) ×2 IMPLANT
PIN DRILL HDLS TROCAR 75 4PK (PIN) IMPLANT
PROTECTOR NERVE ULNAR (MISCELLANEOUS) ×2 IMPLANT
RESTRICTOR CEMENT SZ 5 C-STEM (Cement) IMPLANT
SEALER BIPOLAR AQUA 6.0 (INSTRUMENTS) ×2 IMPLANT
SET HNDPC FAN SPRY TIP SCT (DISPOSABLE) ×2 IMPLANT
SET PAD KNEE POSITIONER (MISCELLANEOUS) ×2 IMPLANT
SOLUTION PRONTOSAN WOUND 350ML (IRRIGATION / IRRIGATOR) ×2 IMPLANT
SPONGE DRAIN TRACH 4X4 STRL 2S (GAUZE/BANDAGES/DRESSINGS) IMPLANT
STAPLER SKIN PROX 35W (STAPLE) IMPLANT
STEM FEM 135L 14D 3 (Stem) IMPLANT
STEM STRT SPLINE PS 16X135 (Stem) IMPLANT
SUT ETHILON 3 0 PS 1 (SUTURE) IMPLANT
SUT MON AB 2-0 CT1 36 (SUTURE) ×4 IMPLANT
SUT NYLON 3 0 (SUTURE) IMPLANT
SUT STRATAFIX 14 PDO 48 VLT (SUTURE) ×2 IMPLANT
SUT VIC AB 1 CTX36XBRD ANBCTR (SUTURE) ×4 IMPLANT
SUT VIC AB 2-0 CT1 TAPERPNT 27 (SUTURE) ×2 IMPLANT
TOWEL GREEN STERILE FF (TOWEL DISPOSABLE) ×2 IMPLANT
TOWER CARTRIDGE SMART MIX (DISPOSABLE) ×4 IMPLANT
TRAY FOLEY MTR SLVR 16FR STAT (SET/KITS/TRAYS/PACK) ×2 IMPLANT
TUBE SUCTION HIGH CAP CLEAR NV (SUCTIONS) ×2 IMPLANT
WEDGE FEM F/ARTHRO 9X5 (Miscellaneous) IMPLANT
WRAP KNEE MAXI GEL POST OP (GAUZE/BANDAGES/DRESSINGS) ×2 IMPLANT

## 2024-11-11 NOTE — Plan of Care (Signed)
  Problem: Activity: Goal: Risk for activity intolerance will decrease Outcome: Progressing   Problem: Pain Managment: Goal: General experience of comfort will improve and/or be controlled Outcome: Progressing

## 2024-11-11 NOTE — Discharge Instructions (Addendum)
 Dr. Redell Shoals Total Joint Specialist [facility] [address], [address] [place], [place] [address] [phone]  TOTAL KNEE REPLACEMENT POSTOPERATIVE DIRECTIONS    Knee Rehabilitation, Guidelines Following Surgery  Results after knee surgery are often greatly improved when you follow the exercise, range of motion and muscle strengthening exercises prescribed by your doctor. Safety measures are also important to protect the knee from further injury. Any time any of these exercises cause you to have increased pain or swelling in your knee joint, decrease the amount until you are comfortable again and slowly increase them. If you have problems or questions, call your caregiver or physical therapist for advice.   WEIGHT BEARING Weight bearing as tolerated with assist device (walker, cane, etc) as directed, use it as long as suggested by your surgeon or therapist, typically at least 4-6 weeks.  HOME CARE INSTRUCTIONS  Remove items at home which could result in a fall. This includes throw rugs or furniture in walking pathways.  Continue medications as instructed at time of discharge. You may have some home medications which will be placed on hold until you complete the course of blood thinner medication.  Hold on showering until Prevena incisional wound vac removed. Do not submerge the incision under water . Just pat the incision dry and apply a dry gauze dressing on daily. Walk with walker as instructed.  You may resume a sexual relationship in one month or when given the OK by your doctor.  Use walker as long as suggested by your caregivers. Avoid periods of inactivity such as sitting longer than an hour when not asleep. This helps prevent blood clots.  You may put full weight on your legs and walk as much as is comfortable with walker.  You may return to work once you are cleared by your doctor.  Do not drive a car for 6 weeks or until released by you surgeon.  Do not  drive while taking narcotics.  Wear the elastic stockings for three weeks following surgery during the day but you may remove then at night. Make sure you keep all of your appointments after your operation with all of your doctors and caregivers. You should call the office at the above phone number and make an appointment for approximately two weeks after the date of your surgery. Do not remove your surgical dressing. Do not shower with this dressing on. Do not take tub baths or submerge the dressing. Please pick up a stool softener and laxative for home use as long as you are requiring pain medications. ICE to the affected knee every three hours for 30 minutes at a time and then as needed for pain and swelling.  Continue to use ice on the knee for pain and swelling from surgery. You may notice swelling that will progress down to the foot and ankle.  This is normal after surgery.  Elevate the leg when you are not up walking on it.   It is important for you to complete the blood thinner medication as prescribed by your doctor. Continue to use the breathing machine which will help keep your temperature down.  It is common for your temperature to cycle up and down following surgery, especially at night when you are not up moving around and exerting yourself.  The breathing machine keeps your lungs expanded and your temperature down.  RANGE OF MOTION AND STRENGTHENING EXERCISES  Rehabilitation of the knee is important following a knee injury or an operation. After just a  few days of immobilization, the muscles of the thigh which control the knee become weakened and shrink (atrophy). Knee exercises are designed to build up the tone and strength of the thigh muscles and to improve knee motion. Often times heat used for twenty to thirty minutes before working out will loosen up your tissues and help with improving the range of motion but do not use heat for the first two weeks following surgery. These exercises  can be done on a training (exercise) mat, on the floor, on a table or on a bed. Use what ever works the best and is most comfortable for you Knee exercises include:  Leg Lifts - While your knee is still immobilized in a splint or cast, you can do straight leg raises. Lift the leg to 60 degrees, hold for 3 sec, and slowly lower the leg. Repeat 10-20 times 2-3 times daily. Perform this exercise against resistance later as your knee gets better.  Quad and Hamstring Sets - Tighten up the muscle on the front of the thigh (Quad) and hold for 5-10 sec. Repeat this 10-20 times hourly. Hamstring sets are done by pushing the foot backward against an object and holding for 5-10 sec. Repeat as with quad sets.  A rehabilitation program following serious knee injuries can speed recovery and prevent re-injury in the future due to weakened muscles. Contact your doctor or a physical therapist for more information on knee rehabilitation.   POST-OPERATIVE OPIOID TAPER INSTRUCTIONS: It is important to wean off of your opioid medication as soon as possible. If you do not need pain medication after your surgery it is ok to stop day one. Opioids include: Codeine, Hydrocodone (Norco, Vicodin), Oxycodone (Percocet, oxycontin ) and hydromorphone  amongst others.  Long term and even short term use of opiods can cause: Increased pain response Dependence Constipation Depression Respiratory depression And more.  Withdrawal symptoms can include Flu like symptoms Nausea, vomiting And more Techniques to manage these symptoms Hydrate well Eat regular healthy meals Stay active Use relaxation techniques(deep breathing, meditating, yoga) Do Not substitute Alcohol  to help with tapering If you have been on opioids for less than two weeks and do not have pain than it is ok to stop all together.  Plan to wean off of opioids This plan should start within one week post op of your joint replacement. Maintain the same interval or time  between taking each dose and first decrease the dose.  Cut the total daily intake of opioids by one tablet each day Next start to increase the time between doses. The last dose that should be eliminated is the evening dose.    SKILLED REHAB INSTRUCTIONS: If the patient is transferred to a skilled rehab facility following release from the hospital, a list of the current medications will be sent to the facility for the patient to continue.  When discharged from the skilled rehab facility, please have the facility set up the patient's Home Health Physical Therapy prior to being released. Also, the skilled facility will be responsible for providing the patient with their medications at time of release from the facility to include their pain medication, the muscle relaxants, and their blood thinner medication. If the patient is still at the rehab facility at time of the two week follow up appointment, the skilled rehab facility will also need to assist the patient in arranging follow up appointment in our office and any transportation needs.  MAKE SURE YOU:  Understand these instructions.  Will watch your condition.  Will  get help right away if you are not doing well or get worse.    Pick up stool softner and laxative for home use following surgery while on pain medications. Do NOT remove your dressing. Keep dressing clean and dry.  Do not take tub baths or submerge incision under water . Please use a clean towel to pat the incision dry following showers. You may shower once Prevena incisional dressing removed.  Continue to use ice for pain and swelling after surgery. Do not use any lotions or creams on the incision until instructed by your surgeon. Please charge Prevena portable wound vac daily.  Keep dressing clean and dry.  Wear knee immobilizer until otherwise instructed. Do not bend your knee.  Take antibiotics as directed.  Empty and recharge JP drain bulb every 4 hours. Keep a log of drain  output daily. Bring log to your outpatient follow-up visit.

## 2024-11-11 NOTE — Anesthesia Procedure Notes (Signed)
 Procedure Name: Intubation Date/Time: 11/11/2024 7:45 AM  Performed by: Vincenzo Show, CRNAPre-anesthesia Checklist: Patient identified, Emergency Drugs available, Suction available, Patient being monitored and Timeout performed Patient Re-evaluated:Patient Re-evaluated prior to induction Oxygen Delivery Method: Circle system utilized Preoxygenation: Pre-oxygenation with 100% oxygen Induction Type: IV induction Ventilation: Mask ventilation without difficulty and Oral airway inserted - appropriate to patient size Laryngoscope Size: Mac and 4 Grade View: Grade II Tube type: Oral Tube size: 7.0 mm Number of attempts: 1 Airway Equipment and Method: Stylet Placement Confirmation: ETT inserted through vocal cords under direct vision, positive ETCO2, CO2 detector and breath sounds checked- equal and bilateral Secured at: 22 cm Tube secured with: Tape Dental Injury: Teeth and Oropharynx as per pre-operative assessment  Comments: ATOI

## 2024-11-11 NOTE — Anesthesia Postprocedure Evaluation (Signed)
 Anesthesia Post Note  Patient: Tyler Gates  Procedure(s) Performed: TOTAL KNEE REVISION (Right: Knee)     Patient location during evaluation: PACU Anesthesia Type: General Level of consciousness: awake and alert Pain management: pain level controlled Vital Signs Assessment: post-procedure vital signs reviewed and stable Respiratory status: spontaneous breathing, nonlabored ventilation, respiratory function stable and patient connected to nasal cannula oxygen Cardiovascular status: blood pressure returned to baseline and stable Postop Assessment: no apparent nausea or vomiting Anesthetic complications: no   No notable events documented.  Last Vitals:  Vitals:   11/11/24 1315 11/11/24 1330  BP: 122/71 109/70  Pulse: 83 92  Resp: 10 17  Temp:    SpO2: (!) 86% 99%    Last Pain:  Vitals:   11/11/24 1330  TempSrc:   PainSc: 0-No pain                 Rome Ade

## 2024-11-11 NOTE — Transfer of Care (Signed)
 Immediate Anesthesia Transfer of Care Note  Patient: Tyler Gates  Procedure(s) Performed: Procedure(s) with comments: TOTAL KNEE REVISION (Right) - Resection right knee scpart and Revision Right total knee arthroplasty  Patient Location: PACU  Anesthesia Type:General  Level of Consciousness:  sedated, patient cooperative and responds to stimulation  Airway & Oxygen Therapy:Patient Spontanous Breathing and Patient connected to face mask oxgen  Post-op Assessment:  Report given to PACU RN and Post -op Vital signs reviewed and stable  Post vital signs:  Reviewed and stable  Last Vitals:  Vitals:   11/11/24 0640  BP: (!) 139/91  Pulse: 93  Resp: 16  Temp: 36.6 C  SpO2: 96%    Complications: No apparent anesthesia complications

## 2024-11-11 NOTE — Anesthesia Procedure Notes (Signed)
 Anesthesia Regional Block: Adductor canal block   Pre-Anesthetic Checklist: , timeout performed,  Correct Patient, Correct Site, Correct Laterality,  Correct Procedure, Correct Position, site marked,  Risks and benefits discussed,  Surgical consent,  Pre-op evaluation,  At surgeon's request and post-op pain management  Laterality: Lower and Right  Prep: chloraprep       Needles:  Injection technique: Single-shot  Needle Type: Echogenic Needle     Needle Length: 9cm  Needle Gauge: 21     Additional Needles:   Procedures:,,,, ultrasound used (permanent image in chart),,    Narrative:  Start time: 11/11/2024 7:11 AM End time: 11/11/2024 7:13 AM Injection made incrementally with aspirations every 5 mL.  Performed by: Personally  Anesthesiologist: Boone Fess, MD  Additional Notes: Patient's chart reviewed and they were deemed appropriate candidate for procedure, per surgeon's request. Patient educated about risks, benefits, and alternatives of the block including but not limited to: temporary or permanent nerve damage, bleeding, infection, damage to surround tissues, block failure, local anesthetic toxicity. Patient expressed understanding. A formal time-out was conducted consistent with institution rules.  Monitors were applied, and minimal sedation used (see nursing record). The site was prepped with skin prep and allowed to dry, and sterile gloves were used. A high frequency linear ultrasound probe with probe cover was utilized throughout. Femoral artery visualized at mid-thigh level, local anesthetic injected anterolateral to it, and echogenic block needle trajectory was monitored throughout. Hydrodissection of saphenous nerve visualized and appeared anatomically normal. Aspiration performed every 5ml. Blood vessels were avoided. All injections were performed without resistance and free of blood and paresthesias. The patient tolerated the procedure well.  Injectate: 20ml 0.25%  bupivacaine 

## 2024-11-11 NOTE — Op Note (Signed)
 OPERATIVE REPORT  SURGEON: Redell Shoals, MD   ASSISTANT: Valery Potters, PA-C  PREOPERATIVE DIAGNOSIS: Status post resection right total knee arthroplasty with placement of articulating antibiotic spacer.  POSTOPERATIVE DIAGNOSIS: Same.  PROCEDURE: Resection articulating spacer right knee. Revision right total knee arthroplasty. Application of customizable negative pressure incisional dressing.  IMPLANTS: Zimmer Persona revision plus femur size 9+ with 5 mm posterior augment x 2, 5 mm distal augments x 2, and 16 x 135 mm straight splined stem. Persona revision fixed bearing tibia size E with 5 mm medial and lateral augments and 14 x 135 mm, 3 mm offset, splined stem. Trabecular metal central tibial cone, size X-small. Vivacit-E polyliner CPS, size 12 mm. 10 mm Stimulan beads with 1 g vancomycin . Biomet bone cement with 1 g vancomycin  per pack.  ANESTHESIA:  General and Regional  TOURNIQUET TIME: Not utilized.   ESTIMATED BLOOD LOSS:-500 mL    ANTIBIOTICS: 2 g Ancef . 1 g vancomycin .  TUBES AND DRAINS: 10 mm flat JP drain in the subcutaneous tissue. Prevena customizable negative pressure incisional dressing at 75 mmHg.  COMPLICATIONS: None   CONDITION: PACU - hemodynamically stable.   BRIEF CLINICAL NOTE: Tyler Gates is a 68 y.o. male who underwent primary right total knee arthroplasty 04/23/2023.  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  Patient has a history of Crohn's disease on Remicade .  His postoperative course was complicated by systemic autoimmune skin rash that took quite some time to resolve.  His infection cleared, confirmed with multiple right knee aspirations.  The most recent Synovasure analysis was done on 09/22/2024, yielding 1590 WBCs with 21.4% neutrophils, alpha defensin negative, microbial ID panel negative,  culture final negative.  Due to persistent effusions and knee pain, the patient was indicated for the above-mentioned procedure.  The risks, benefits, and alternatives to the procedure were explained, and the patient elected to proceed.  PROCEDURE IN DETAIL: Adductor canal block was obtained in the pre-op holding area. Once inside the operative room, general anesthesia was obtained, and a foley catheter was inserted. The patient was then positioned and the lower extremity was prepped and draped in the normal sterile surgical fashion.  A time-out was called verifying side and site of surgery. The patient received IV antibiotics within 60 minutes of beginning the procedure. A tourniquet was applied to the upper thigh but not utilized.   The previous anterior scar was excised with a #10 blade.  Full-thickness skin flaps were created.  Medial parapatellar arthrotomy was performed.  Joint fluid was grossly benign, unchanged from prior aspirations. A medial release was performed and the patellar scar was sharply excised.  The knee was brought into extension, and radical synovectomy was performed of the suprapatellar pouch, medial gutter, and lateral gutter.  I inspected the patella.  I was able to remove the cement spacer easily by hand.  The patella was curetted to a stable, bony base.  The knee was flexed, and I was able to easily remove the femoral component.  There was no bone loss.  I was able to easily remove the tibial component.  There was no bone loss.  I sequentially reamed up to a 14 mm reamer for the tibia.  Intramedullary cutting guide was placed, and a cleanup cut was made.  The intramedullary canal was debrided with back scratchers.  I sized the tibial component to an E and set the rotation.  3 mm offset was determined to maximize coverage.  The proximal tibia was prepared for a size extra small cone.  I elected to place 5 mm medial and lateral augments.  The proximal tibia was prepared.  The  trial cone was placed.  The trial tibial component was placed.  I then turned my attention to the femur.  I progressively reamed up to a size 16 mm reamer.  Distal femoral cleanup cut was made.  4-in-1 cutting guide was pinned into place while the flexion gap was tensioned at 90 degrees.  Distal cleanup cuts were made.  In order to optimize coverage, 5 mm posterior augments x 2 were utilized.  The trial femoral component was inserted.  The box cut was made.  Trial poly liner was placed.  At this point the trial femoral component was removed and I added 5 mm medial and lateral augments to distalize the joint surface.  The trial femoral component was replaced followed by the polyliner.  The knee was brought through a range of motion.  There was excellent varus/valgus balance throughout the range of motion.  The flexion and extension gaps were equal.  The patella tracked centrally using no thumbs technique.  All trial components were removed.  Radical synovectomy of the posterior compartment was performed.  The cut bony surfaces were irrigated with pulse lavage and dried with lap sponges.  The real components were opened and assembled on the back table.  The real tibial cone was impacted.  The plane bone cement was mixed with 1 g of vancomycin  per packed.  The components were placed using hybrid fixation technique.  The trial poly liner was placed, and the knee was brought into extension while the cement polymerized.  All excess cement was cleared.  Once the cement was hardened, the trial poly liner was exchanged for the real poly liner.   The wound was copiously irrigated with Prontosan solution and normal saline using pulse lavage.  The arthrotomy was repaired with #1 Vicryl and #1 STRATAFIX suture.  Through a separate stab incision over the lateral femur, I placed a 10 mm flat JP drain into the subcutaneous tissue.  The drain was sewn into place with 3-0 nylon suture.  The remainder of the wound was closed in  layers with 2-0 Vicryl for the subcutaneous fat, 2-0 Monocryl for the deep dermal layer, and skin staples.  A customizable Prevena negative pressure dressing was applied and hooked up to suction at 75 mmHg.  There was excellent seal without any leak.  A compressive dressing was applied followed by a knee immobilizer.  The patient was transported to the recovery room in stable condition.  Sponge, needle, and instrument counts were correct at the end of the case x2.  The patient tolerated the procedure well and there were no known complications.  Please note that a surgical assistant was a medical necessity for this procedure in order to perform it in a safe and expeditious manner. Surgical assistant was necessary to retract the ligaments and vital neurovascular structures to prevent injury to them and also necessary for proper positioning of the limb to allow for anatomic placement of the prosthesis.

## 2024-11-11 NOTE — Progress Notes (Signed)
 PT Cancellation Note  Patient Details Name: SAFWAN TOMEI MRN: [id number] DOB: 09/18/1956   Cancelled Treatment:     PT order received but eval deferred - RN reports pt with soft BP; pt with slow responses and states, I'm [place]  Will follow in am   Jessicah Croll 11/11/2024, 4:13 PM

## 2024-11-11 NOTE — Interval H&P Note (Signed)
 History and Physical Interval Note:  11/11/2024 7:16 AM  Tyler Gates  has presented today for surgery, with the diagnosis of Inefection associated with prothesis right joint.  The various methods of treatment have been discussed with the patient and family. After consideration of risks, benefits and other options for treatment, the patient has consented to  Procedure(s) with comments: TOTAL KNEE REVISION (Right) - Resection right knee scpart and Revision Right total knee arthroplasty as a surgical intervention.  The patient's history has been reviewed, patient examined, no change in status, stable for surgery.  I have reviewed the patient's chart and labs.  Questions were answered to the patient's satisfaction.    The risks, benefits, and alternatives were discussed with the patient. There are risks associated with the surgery including, but not limited to, problems with anesthesia (death), infection, instability (giving out of the joint), dislocation, differences in leg length/angulation/rotation, fracture of bones, loosening or failure of implants, hematoma (blood accumulation) which may require surgical drainage, blood clots, pulmonary embolism, nerve injury (foot drop and lateral thigh numbness), and blood vessel injury. The patient understands these risks and elects to proceed.    Tyler Gates Tyler Gates

## 2024-11-11 NOTE — Anesthesia Preprocedure Evaluation (Signed)
 Anesthesia Evaluation  Patient identified by MRN, date of birth, ID band Patient awake    Reviewed: Allergy & Precautions, NPO status , Patient's Chart, lab work & pertinent test results  History of Anesthesia Complications Negative for: history of anesthetic complications  Airway Mallampati: III  TM Distance: >3 FB Neck ROM: Full    Dental  (+) Missing, Chipped   Pulmonary sleep apnea , neg COPD, Patient abstained from smoking.Not current smoker   Pulmonary exam normal breath sounds clear to auscultation       Cardiovascular Exercise Tolerance: Good METShypertension, Pt. on medications + CAD (mild, non-obstructive by CT)  (-) Past MI (-) dysrhythmias  Rhythm:Regular Rate:Normal - Systolic murmurs '23 ECHO: EF 55 to 60%.  1. The LV has normal function, no regional wall motion abnormalities. There is mild asymmetric left ventricular hypertrophy of the septal segment. Grade I diastolic  dysfunction (impaired relaxation).   2. RVF is low normal. The right ventricular size is normal. There is normal pulmonary artery systolic pressure. The estimated right ventricular systolic pressure is 18.2 mmHg.   3. The mitral valve is grossly normal. Trivial mitral valve regurgitation.   4. The aortic valve is tricuspid. There is mild calcification of the aortic valve. Aortic valve regurgitation is not visualized.     Neuro/Psych  PSYCHIATRIC DISORDERS  Depression    negative neurological ROS     GI/Hepatic ,GERD  Medicated,,(+)     (-) substance abuse    Endo/Other  neg diabetes    Renal/GU Renal InsufficiencyRenal disease     Musculoskeletal  (+) Arthritis , Osteoarthritis,    Abdominal   Peds  Hematology Denies blood thinner use or bleeding disorders.    Anesthesia Other Findings Denies blood thinner use or bleeding diatheses. Recent labs reviewed. Past Medical History: No date: Arthritis No date: Crohn's disease (HCC) No  date: Depression No date: Dyspnea on exertion No date: GERD (gastroesophageal reflux disease) No date: Herpes No date: History of kidney stones No date: Hyperlipidemia No date: Hypertension No date: Instability of knee joint 07/11/2017: Left displaced femoral neck fracture (HCC) No date: OSA (obstructive sleep apnea)     Comment:  mild No date: Snoring   Reproductive/Obstetrics                              Anesthesia Physical Anesthesia Plan  ASA: 2  Anesthesia Plan: General   Post-op Pain Management: Tylenol  PO (pre-op)* and Dilaudid  IV   Induction: Intravenous  PONV Risk Score and Plan: 3 and Ondansetron , Dexamethasone  and Midazolam   Airway Management Planned: Oral ETT  Additional Equipment: None  Intra-op Plan:   Post-operative Plan: Extubation in OR  Informed Consent: I have reviewed the patients History and Physical, chart, labs and discussed the procedure including the risks, benefits and alternatives for the proposed anesthesia with the patient or authorized representative who has indicated his/her understanding and acceptance.     Dental advisory given  Plan Discussed with: CRNA and Surgeon  Anesthesia Plan Comments: (Discussed r/b/a of neuraxial vs GETA. Given posted length of case, advised patient theres a reasonable chance we would have to plce an ETT anyway if we did spinal. Plan for ETT at the outset. Discussed risks of anesthesia with patient, including PONV, sore throat, lip/dental/eye damage. Rare risks discussed as well, such as cardiorespiratory and neurological sequelae, and allergic reactions. Discussed the role of CRNA in patient's perioperative care. Patient understands. Discussed r/b/a of adductor  canal nerve block, including:  - bleeding, infection, nerve damage - poor or non functioning block. - reactions and toxicity to local anesthetic Patient understands. )         Anesthesia Quick Evaluation

## 2024-11-12 ENCOUNTER — Encounter: Payer: Self-pay | Admitting: Gastroenterology

## 2024-11-12 ENCOUNTER — Other Ambulatory Visit (HOSPITAL_COMMUNITY): Payer: Self-pay

## 2024-11-12 ENCOUNTER — Other Ambulatory Visit: Payer: Self-pay | Admitting: Cardiology

## 2024-11-12 DIAGNOSIS — E785 Hyperlipidemia, unspecified: Secondary | ICD-10-CM

## 2024-11-12 LAB — CBC
HCT: 29.1 % — ABNORMAL LOW (ref 39.0–52.0)
Hemoglobin: 9.7 g/dL — ABNORMAL LOW (ref 13.0–17.0)
MCH: 31.7 pg (ref 26.0–34.0)
MCHC: 33.3 g/dL (ref 30.0–36.0)
MCV: 95.1 fL (ref 80.0–100.0)
Platelets: 178 K/uL (ref 150–400)
RBC: 3.06 MIL/uL — ABNORMAL LOW (ref 4.22–5.81)
RDW: 12.9 % (ref 11.5–15.5)
WBC: 12.3 K/uL — ABNORMAL HIGH (ref 4.0–10.5)
nRBC: 0 % (ref 0.0–0.2)

## 2024-11-12 LAB — BASIC METABOLIC PANEL WITH GFR
Anion gap: 10 (ref 5–15)
Anion gap: 7 (ref 5–15)
BUN: 34 mg/dL — ABNORMAL HIGH (ref 8–23)
BUN: 32 mg/dL — ABNORMAL HIGH (ref 8–23)
CO2: 25 mmol/L (ref 22–32)
CO2: 25 mmol/L (ref 22–32)
Calcium: 8.8 mg/dL — ABNORMAL LOW (ref 8.9–10.3)
Calcium: 8.8 mg/dL — ABNORMAL LOW (ref 8.9–10.3)
Chloride: 103 mmol/L (ref 98–111)
Chloride: 103 mmol/L (ref 98–111)
Creatinine, Ser: 1.95 mg/dL — ABNORMAL HIGH (ref 0.61–1.24)
Creatinine, Ser: 1.69 mg/dL — ABNORMAL HIGH (ref 0.61–1.24)
GFR, Estimated: 37 mL/min — ABNORMAL LOW (ref >60)
GFR, Estimated: 44 mL/min — ABNORMAL LOW (ref >60)
Glucose, Bld: 138 mg/dL — ABNORMAL HIGH (ref 70–99)
Glucose, Bld: 177 mg/dL — ABNORMAL HIGH (ref 70–99)
Potassium: 4.7 mmol/L (ref 3.5–5.1)
Potassium: 4.5 mmol/L (ref 3.5–5.1)
Sodium: 137 mmol/L (ref 135–145)
Sodium: 135 mmol/L (ref 135–145)

## 2024-11-12 MED ORDER — ONDANSETRON HCL 4 MG PO TABS
4.0000 mg | ORAL_TABLET | Freq: Three times a day (TID) | ORAL | 0 refills | Status: DC | PRN
  Filled 2024-11-12: qty 30, 10d supply, fill #0

## 2024-11-12 MED ORDER — ASPIRIN 81 MG PO CHEW
81.0000 mg | CHEWABLE_TABLET | Freq: Two times a day (BID) | ORAL | 0 refills | Status: AC
  Filled 2024-11-12: qty 90, 45d supply, fill #0

## 2024-11-12 MED ORDER — DOCUSATE SODIUM 100 MG PO CAPS
100.0000 mg | ORAL_CAPSULE | Freq: Two times a day (BID) | ORAL | 0 refills | Status: AC
  Filled 2024-11-12: qty 60, 30d supply, fill #0

## 2024-11-12 MED ORDER — OXYCODONE HCL 5 MG PO TABS
5.0000 mg | ORAL_TABLET | ORAL | 0 refills | Status: DC | PRN
  Filled 2024-11-12: qty 42, 7d supply, fill #0

## 2024-11-12 MED ORDER — CIPROFLOXACIN HCL 250 MG PO TABS
250.0000 mg | ORAL_TABLET | Freq: Two times a day (BID) | ORAL | 11 refills | Status: AC
  Filled 2024-11-12: qty 60, 30d supply, fill #0

## 2024-11-12 MED ORDER — POLYETHYLENE GLYCOL 3350 17 GM/SCOOP PO POWD
17.0000 g | Freq: Every day | ORAL | 0 refills | Status: AC | PRN
  Filled 2024-11-12: qty 238, 14d supply, fill #0

## 2024-11-12 MED ORDER — SENNA 8.6 MG PO TABS
2.0000 | ORAL_TABLET | Freq: Every day | ORAL | 0 refills | Status: AC
  Filled 2024-11-12: qty 30, 15d supply, fill #0

## 2024-11-12 NOTE — Progress Notes (Signed)
 Afternoon creatinine improved. Ambulated well with PT. Voiding without difficulty. D/c home.

## 2024-11-12 NOTE — Discharge Summary (Signed)
 Physician Discharge Summary  Patient ID: NDREW Gates MRN: [id number] DOB/AGE: 1956/05/06 68 y.o.  Admit date: 11/11/2024 Discharge date: 11/12/2024  Admission Diagnoses:  Infection of total right knee replacement  Discharge Diagnoses:  Principal Problem:   Infection of total right knee replacement Active Problems:   S/P revision of total knee, right   Past Medical History:  Diagnosis Date   Arthritis    Crohn's disease (HCC)    Depression    Dyspnea on exertion    GERD (gastroesophageal reflux disease)    Herpes    History of kidney stones    Hyperlipidemia    Hypertension    Instability of knee joint    Left displaced femoral neck fracture (HCC) 07/11/2017   OSA (obstructive sleep apnea)    mild   Snoring     Surgeries: Procedure(s): TOTAL KNEE REVISION on 11/11/2024   Consultants (if any):   Discharged Condition: Improved  Hospital Course: Tyler Gates is an 68 y.o. male who was admitted 11/11/2024 with a diagnosis of Infection of total right knee replacement and went to the operating room on 11/11/2024 and underwent the above named procedures.    He was given perioperative antibiotics:  Anti-infectives (From admission, onward)    Start     Dose/Rate Route Frequency Ordered Stop   11/12/24 0000  ciprofloxacin  (CIPRO ) 250 MG tablet        250 mg Oral 2 times daily 11/12/24 1506     11/11/24 2000  ciprofloxacin  (CIPRO ) tablet 250 mg        250 mg Oral 2 times daily 11/11/24 1545     11/11/24 1600  ceFAZolin  (ANCEF ) IVPB 2g/100 mL premix        2 g 200 mL/hr over 30 Minutes Intravenous Every 6 hours 11/11/24 1545 11/11/24 2140   11/11/24 0836  vancomycin  (VANCOCIN ) powder  Status:  Discontinued          As needed 11/11/24 0837 11/11/24 1151   11/11/24 0720  vancomycin  (VANCOCIN ) 1-5 GM/200ML-% IVPB       Note to Pharmacy: Sonny Mays B: cabinet override      11/11/24 0720 11/11/24 1929   11/11/24 0600  ceFAZolin  (ANCEF ) IVPB 2g/100 mL premix         2 g 200 mL/hr over 30 Minutes Intravenous On call to O.R. 11/11/24 0553 11/11/24 0723       He was given sequential compression devices, early ambulation, and aspirin  for DVT prophylaxis.  POD#1 Patient ambulated well with PT 90 feet with KI. Morning creatinine elevated, afternoon creatinine improved. D/c home with HEP until 2 week follow-up then OPPT. Follow-up next Tuesday for removal of Prevena negative pressure incisional dressing.   He benefited maximally from the hospital stay and there were no complications.    Recent vital signs:  Vitals:   11/12/24 0105 11/12/24 0519  BP: 95/62 114/62  Pulse: 95 94  Resp: 15 15  Temp: 97.9 F (36.6 C) 98.4 F (36.9 C)  SpO2: 95% 93%    Recent laboratory studies:  Lab Results  Component Value Date   HGB 9.7 (L) 11/12/2024   HGB 15.1 11/04/2024   HGB 14.1 02/16/2024   Lab Results  Component Value Date   WBC 12.3 (H) 11/12/2024   PLT 178 11/12/2024   No results found for: INR Lab Results  Component Value Date   NA 135 11/12/2024   K 4.5 11/12/2024   CL 103 11/12/2024   CO2 25 11/12/2024  BUN 32 (H) 11/12/2024   CREATININE 1.69 (H) 11/12/2024   GLUCOSE 177 (H) 11/12/2024     Allergies as of 11/12/2024       Reactions   Sulfasalazine  Other (See Comments)   Flu like feelings   Statins Other (See Comments)   REACTION: Myalgia        Medication List     PAUSE taking these medications    Remicade  100 MG injection Wait to take this until your doctor or other care provider tells you to start again. Generic drug: inFLIXimab  INFUSE 400MG  IV EVERY 8 WEEKS       TAKE these medications    amLODipine  5 MG tablet Commonly known as: NORVASC  TAKE 2 TABLETS BY MOUTH EVERY DAY   aspirin  81 MG chewable tablet Commonly known as: Aspirin  Childrens Chew 1 tablet (81 mg total) by mouth 2 (two) times daily with a meal.   ciprofloxacin  250 MG tablet Commonly known as: CIPRO  Take 1 tablet (250 mg total) by mouth  2 (two) times daily.   docusate sodium  100 MG capsule Commonly known as: Colace Take 1 capsule (100 mg total) by mouth 2 (two) times daily.   ezetimibe  10 MG tablet Commonly known as: ZETIA  TAKE 1 TABLET BY MOUTH EVERY DAY   meloxicam 15 MG tablet Commonly known as: MOBIC Take 15 mg by mouth in the morning.   olmesartan  20 MG tablet Commonly known as: BENICAR  TAKE 1 TABLET BY MOUTH EVERY DAY   omeprazole  40 MG capsule Commonly known as: PRILOSEC TAKE 1 CAPSULE (40 MG TOTAL) BY MOUTH IN THE MORNING AND AT BEDTIME.   ondansetron  4 MG tablet Commonly known as: Zofran  Take 1 tablet (4 mg total) by mouth every 8 (eight) hours as needed for nausea or vomiting.   oxyCODONE  5 MG immediate release tablet Commonly known as: Roxicodone  Take 1 tablet (5 mg total) by mouth every 4 (four) hours as needed for severe pain (pain score 7-10).   polyethylene glycol 17 g packet Commonly known as: MiraLax  Take 17 g by mouth daily as needed for mild constipation or moderate constipation.   rosuvastatin  20 MG tablet Commonly known as: CRESTOR  TAKE 1 TABLET BY MOUTH EVERY DAY   senna 8.6 MG Tabs tablet Commonly known as: SENOKOT Take 2 tablets (17.2 mg total) by mouth at bedtime for 15 days.   triamcinolone  ointment 0.1 % Commonly known as: KENALOG  Apply 1 Application topically 2 (two) times daily. What changed:  when to take this reasons to take this   venlafaxine  37.5 MG tablet Commonly known as: EFFEXOR  Take 1 tablet (37.5 mg total) by mouth 2 (two) times daily.               Discharge Care Instructions  (From admission, onward)           Start     Ordered   11/12/24 0000  Weight bearing as tolerated       Comments: With knee immobilizer  Question Answer Comment  Laterality right   Extremity Lower      11/12/24 1506   11/12/24 0000  Change dressing       Comments: Do not remove your dressing.   11/12/24 1506              WEIGHT BEARING   Weight  bearing as tolerated with assist device (walker, cane, etc) as directed, use it as long as suggested by your surgeon or therapist, typically at least 4-6 weeks. With knee immobilizer.  EXERCISES  Results after joint replacement surgery are often greatly improved when you follow the exercise, range of motion and muscle strengthening exercises prescribed by your doctor. Safety measures are also important to protect the joint from further injury. Any time any of these exercises cause you to have increased pain or swelling, decrease what you are doing until you are comfortable again and then slowly increase them. If you have problems or questions, call your caregiver or physical therapist for advice.   Rehabilitation is important following a joint replacement. After just a few days of immobilization, the muscles of the leg can become weakened and shrink (atrophy).  These exercises are designed to build up the tone and strength of the thigh and leg muscles and to improve motion. Often times heat used for twenty to thirty minutes before working out will loosen up your tissues and help with improving the range of motion but do not use heat for the first two weeks following surgery (sometimes heat can increase post-operative swelling).   These exercises can be done on a training (exercise) mat, on the floor, on a table or on a bed. Use whatever works the best and is most comfortable for you.    Use music or television while you are exercising so that the exercises are a pleasant break in your day. This will make your life better with the exercises acting as a break in your routine that you can look forward to.   Perform all exercises about fifteen times, three times per day or as directed.  You should exercise both the operative leg and the other leg as well.  Exercises include:   Quad Sets - Tighten up the muscle on the front of the thigh (Quad) and hold for 5-10 seconds.   Straight Leg Raises - With your  knee straight (if you were given a brace, keep it on), lift the leg to 60 degrees, hold for 3 seconds, and slowly lower the leg.  Perform this exercise against resistance later as your leg gets stronger.  Leg Slides: Lying on your back, slowly slide your foot toward your buttocks, bending your knee up off the floor (only go as far as is comfortable). Then slowly slide your foot back down until your leg is flat on the floor again.  Angel Wings: Lying on your back spread your legs to the side as far apart as you can without causing discomfort.  Hamstring Strength:  Lying on your back, push your heel against the floor with your leg straight by tightening up the muscles of your buttocks.  Repeat, but this time bend your knee to a comfortable angle, and push your heel against the floor.  You may put a pillow under the heel to make it more comfortable if necessary.   A rehabilitation program following joint replacement surgery can speed recovery and prevent re-injury in the future due to weakened muscles. Contact your doctor or a physical therapist for more information on knee rehabilitation.    CONSTIPATION  Constipation is defined medically as fewer than three stools per week and severe constipation as less than one stool per week.  Even if you have a regular bowel pattern at home, your normal regimen is likely to be disrupted due to multiple reasons following surgery.  Combination of anesthesia, postoperative narcotics, change in appetite and fluid intake all can affect your bowels.   YOU MUST use at least one of the following options; they are listed in order of increasing  strength to get the job done.  They are all available over the counter, and you may need to use some, POSSIBLY even all of these options:    Drink plenty of fluids (prune juice may be helpful) and high fiber foods Colace 100 mg by mouth twice a day  Senokot for constipation as directed and as needed Dulcolax (bisacodyl ), take with  full glass of water   Miralax  (polyethylene glycol) once or twice a day as needed.  If you have tried all these things and are unable to have a bowel movement in the first 3-4 days after surgery call either your surgeon or your primary doctor.    If you experience loose stools or diarrhea, hold the medications until you stool forms back up.  If your symptoms do not get better within 1 week or if they get worse, check with your doctor.  If you experience the worst abdominal pain ever or develop nausea or vomiting, please contact the office immediately for further recommendations for treatment.   ITCHING:  If you experience itching with your medications, try taking only a single pain pill, or even half a pain pill at a time.  You can also use Benadryl  over the counter for itching or also to help with sleep.   TED HOSE STOCKINGS:  Use stockings on both legs until for at least 2 weeks or as directed by physician office. They may be removed at night for sleeping.  MEDICATIONS:  See your medication summary on the "After Visit Summary" that nursing will review with you.  You may have some home medications which will be placed on hold until you complete the course of blood thinner medication.  It is important for you to complete the blood thinner medication as prescribed.  PRECAUTIONS:  If you experience chest pain or shortness of breath - call 911 immediately for transfer to the hospital emergency department.   If you develop a fever greater that 101 F, purulent drainage from wound, increased redness or drainage from wound, foul odor from the wound/dressing, or calf pain - CONTACT YOUR SURGEON.                                                   FOLLOW-UP APPOINTMENTS:  If you do not already have a post-op appointment, please call the office for an appointment to be seen by your surgeon.  Guidelines for how soon to be seen are listed in your "After Visit Summary", but are typically between 1-4 weeks after  surgery.  OTHER INSTRUCTIONS:   Knee Replacement:  Do not place pillow under knee, focus on keeping the knee straight while resting. CPM instructions: 0-90 degrees, 2 hours in the morning, 2 hours in the afternoon, and 2 hours in the evening. Place foam block, curve side up under heel at all times except when in CPM or when walking.  DO NOT modify, tear, cut, or change the foam block in any way.   MAKE SURE YOU:  Understand these instructions.  Get help right away if you are not doing well or get worse.    Thank you for letting us  be a part of your medical care team.  It is a privilege we respect greatly.  We hope these instructions will help you stay on track for a fast and full recovery!   Diagnostic Studies:  DG Knee Right Port Result Date: 11/11/2024 EXAM: 1 or 2 VIEW(S) XRAY OF THE RIGHT KNEE 11/11/2024 12:40:00 PM COMPARISON: 10/01/2023 CLINICAL HISTORY: S/P revision of total knee, right FINDINGS: BONES AND JOINTS: Right total knee arthroplasty with long stem tibial and femoral components. Joint fluid. SOFT TISSUES: Postsurgical changes from right total knee arthroplasty with antibiotic beads in the soft tissue surrounding the distal femur. Surgical drain medial to the knee. Surgical staples anteriorly with anterior edema. IMPRESSION: 1. Expected appearance after redo knee arthroplasty. Electronically signed by: Rockey Kilts MD 11/11/2024 01:20 PM EST RP Workstation: [id number]    Disposition: Discharge disposition: 01-Home or Self Care       Discharge Instructions     Call MD / Call 911   Complete by: As directed    If you experience chest pain or shortness of breath, CALL 911 and be transported to the hospital emergency room.  If you develope a fever above 101 F, pus (white drainage) or increased drainage or redness at the wound, or calf pain, call your surgeon's office.   Change dressing   Complete by: As directed    Do not remove your dressing.   Constipation Prevention    Complete by: As directed    Drink plenty of fluids.  Prune juice may be helpful.  You may use a stool softener, such as Colace (over the counter) 100 mg twice a day.  Use MiraLax  (over the counter) for constipation as needed.   Diet - low sodium heart healthy   Complete by: As directed    Discharge instructions   Complete by: As directed    Elevate toes above nose. Use cryotherapy as needed for pain and swelling.   Do not put a pillow under the knee. Place it under the heel.   Complete by: As directed    Driving restrictions   Complete by: As directed    No driving for 6 weeks   Increase activity slowly as tolerated   Complete by: As directed    Lifting restrictions   Complete by: As directed    No lifting for 6 weeks   Post-operative opioid taper instructions:   Complete by: As directed    POST-OPERATIVE OPIOID TAPER INSTRUCTIONS: It is important to wean off of your opioid medication as soon as possible. If you do not need pain medication after your surgery it is ok to stop day one. Opioids include: Codeine, Hydrocodone (Norco, Vicodin), Oxycodone (Percocet, oxycontin ) and hydromorphone  amongst others.  Long term and even short term use of opiods can cause: Increased pain response Dependence Constipation Depression Respiratory depression And more.  Withdrawal symptoms can include Flu like symptoms Nausea, vomiting And more Techniques to manage these symptoms Hydrate well Eat regular healthy meals Stay active Use relaxation techniques(deep breathing, meditating, yoga) Do Not substitute Alcohol  to help with tapering If you have been on opioids for less than two weeks and do not have pain than it is ok to stop all together.  Plan to wean off of opioids This plan should start within one week post op of your joint replacement. Maintain the same interval or time between taking each dose and first decrease the dose.  Cut the total daily intake of opioids by one tablet each  day Next start to increase the time between doses. The last dose that should be eliminated is the evening dose.      TED hose   Complete by: As directed    Use stockings (TED hose) for 2  weeks on both leg(s).  You may remove them at night for sleeping.   Weight bearing as tolerated   Complete by: As directed    With knee immobilizer   Laterality: right   Extremity: Lower        Follow-up Information     Leigh Valery RAMAN, PA-C Follow up in 7 day(s).   Specialty: Orthopedic Surgery Why: within 7 days of discharge for removal of Prevena negative pressure dressing and wound re-check. Contact information: [address] [place] [address] [phone]                  Signed: Valery RAMAN Leigh 11/12/2024, 3:08 PM

## 2024-11-12 NOTE — Plan of Care (Signed)
  Problem: Education: Goal: Knowledge of General Education information will improve Description: Including pain rating scale, medication(s)/side effects and non-pharmacologic comfort measures Outcome: Progressing   Problem: Health Behavior/Discharge Planning: Goal: Ability to manage health-related needs will improve Outcome: Progressing   Problem: Clinical Measurements: Goal: Ability to maintain clinical measurements within normal limits will improve Outcome: Progressing Goal: Will remain free from infection Outcome: Progressing Goal: Diagnostic test results will improve Outcome: Progressing Goal: Respiratory complications will improve Outcome: Progressing Goal: Cardiovascular complication will be avoided Outcome: Progressing   Problem: Activity: Goal: Risk for activity intolerance will decrease Outcome: Progressing   Problem: Nutrition: Goal: Adequate nutrition will be maintained Outcome: Progressing   Problem: Coping: Goal: Level of anxiety will decrease Outcome: Progressing   Problem: Elimination: Goal: Will not experience complications related to bowel motility Outcome: Progressing Goal: Will not experience complications related to urinary retention Outcome: Progressing   Problem: Pain Managment: Goal: General experience of comfort will improve and/or be controlled Outcome: Progressing   Problem: Safety: Goal: Ability to remain free from injury will improve Outcome: Progressing   Problem: Skin Integrity: Goal: Risk for impaired skin integrity will decrease Outcome: Progressing   Problem: Activity: Goal: Ability to avoid complications of mobility impairment will improve Outcome: Progressing Goal: Range of joint motion will improve Outcome: Progressing   Problem: Pain Management: Goal: Pain level will decrease with appropriate interventions Outcome: Progressing   Problem: Skin Integrity: Goal: Will show signs of wound healing Outcome: Progressing

## 2024-11-12 NOTE — Progress Notes (Signed)
    Subjective:  Patient reports pain as mild.  Denies N/V/CP/SOB/Abd pain.  Void pending.  Discussed care plan and barriers to discharge.  Discussed to continue PO fluids due to kidney function.   Objective:   VITALS:   Vitals:   11/11/24 1845 11/11/24 2010 11/12/24 0105 11/12/24 0519  BP: 93/64 (!) 97/53 95/62 114/62  Pulse: 81 86 95 94  Resp: 15 15 15 15   Temp: 97.7 F (36.5 C) 97.7 F (36.5 C) 97.9 F (36.6 C) 98.4 F (36.9 C)  TempSrc: Oral Oral Oral Oral  SpO2:  96% 95% 93%  Weight:      Height:        NAD Neurologically intact ABD soft Neurovascular intact Sensation intact distally Intact pulses distally Dorsiflexion/Plantar flexion intact No cellulitis present Compartment soft KI on.  Prevena incisional dressing C/D/I. no leaks detected.  JP drain dressing C/D/I. Output 33 cc SS fluid. Continue drain.   Lab Results  Component Value Date   WBC 12.3 (H) 11/12/2024   HGB 9.7 (L) 11/12/2024   HCT 29.1 (L) 11/12/2024   MCV 95.1 11/12/2024   PLT 178 11/12/2024   BMET    Component Value Date/Time   NA 137 11/12/2024 0437   NA 139 08/01/2023 0843   K 4.7 11/12/2024 0437   CL 103 11/12/2024 0437   CO2 25 11/12/2024 0437   GLUCOSE 138 (H) 11/12/2024 0437   BUN 34 (H) 11/12/2024 0437   BUN 17 08/01/2023 0843   CREATININE 1.95 (H) 11/12/2024 0437   CREATININE 1.30 12/08/2023 1039   CALCIUM  8.8 (L) 11/12/2024 0437   EGFR 60 12/08/2023 1039   EGFR 56 (L) 08/01/2023 0843   GFRNONAA 37 (L) 11/12/2024 0437   GFRNONAA 58 (L) 05/28/2019 0842     Assessment/Plan: 1 Day Post-Op   Principal Problem:   Infection of total right knee replacement Active Problems:   S/P revision of total knee, right  ABLA. Hemoglobin 9.7. Asymptomatic. Continue to monitor.  Creatinine elevated 7.95 (1.28 prior to admission). Continue IV fluids, continue PO fluids.   WBAT with walker, KI do not bend knee.  DVT ppx: Aspirin , SCDs, TEDS PO pain control PT/OT: TO  come today.  Dispo:  - JP drain C/D/I. Can d/c drain when output less than 10 cc per shift. Okay to d/c home with.  - Prevena negative pressure incisional dressing C/D/I on house vac. Upon discharge nurse will convert house vac suction unit to portable prevena vac. Patient will follow-up within 7 days of discharge for repeat evaluation and removal of prevena incisional dressing.  - Recheck at 2:00pm. If improved and cleared PT can d/c home with HEP, will transition to OPPT in 2 weeks.    Tyler Gates Tyler Gates 11/12/2024, 7:59 AM   [facility]  [place] [address], [address], [place], [place] [address] Phone: [phone] [email or web address] Facebook  [employer]

## 2024-11-12 NOTE — Evaluation (Signed)
 Physical Therapy Evaluation Patient Details Name: Tyler Gates MRN: [id number] DOB: 03-27-1956 Today's Date: 11/12/2024  History of Present Illness  Pt s/p R TKR reimplantation following TKR excision with spacer placement ~ 1 year ago.  Clinical Impression  Pt admitted as above and presenting with functional mobility limitations 2* post op pain and NO flexion allowed to R knee with KI in place at all times.  Pt very motivated and should progress well to dc home with family assist.  Pt hopeful for dc home this date.      If plan is discharge home, recommend the following: A little help with walking and/or transfers;A little help with bathing/dressing/bathroom;Assistance with cooking/housework;Assist for transportation   Can travel by private vehicle        Equipment Recommendations None recommended by PT  Recommendations for Other Services       Functional Status Assessment Patient has had a recent decline in their functional status and demonstrates the ability to make significant improvements in function in a reasonable and predictable amount of time.     Precautions / Restrictions Precautions Precautions: Knee;Fall;Other (comment) Precaution/Restrictions Comments: NO R knee Flexion Required Braces or Orthoses: Knee Immobilizer - Right Knee Immobilizer - Right: On at all times Restrictions Weight Bearing Restrictions Per Provider Order: Yes RLE Weight Bearing Per Provider Order: Weight bearing as tolerated      Mobility  Bed Mobility Overal bed mobility: Needs Assistance Bed Mobility: Supine to Sit     Supine to sit: Contact guard     General bed mobility comments: use of gait belt to manage R LE    Transfers Overall transfer level: Needs assistance Equipment used: Rolling walker (2 wheels) Transfers: Sit to/from Stand Sit to Stand: Contact guard assist           General transfer comment: Steady assist with min cues for LE management and use of UEs to  self assist    Ambulation/Gait Ambulation/Gait assistance: Min assist, Contact guard assist Gait Distance (Feet): 90 Feet Assistive device: Rolling walker (2 wheels) Gait Pattern/deviations: Step-to pattern, Decreased step length - right, Decreased step length - left, Shuffle Gait velocity: decr     General Gait Details: min cues for sequence, posture and position from [employer]            Wheelchair Mobility     Tilt Bed    Modified Rankin (Stroke Patients Only)       Balance Overall balance assessment: Needs assistance Sitting-balance support: Feet supported, No upper extremity supported Sitting balance-Leahy Scale: Good     Standing balance support: Bilateral upper extremity supported Standing balance-Leahy Scale: Poor                               Pertinent Vitals/Pain      Home Living Family/patient expects to be discharged to:: Private residence Living Arrangements: Spouse/significant other Available Help at Discharge: Family;Available 24 hours/day Type of Home: House Home Access: Stairs to enter Entrance Stairs-Rails: Left Entrance Stairs-Number of Steps: 3   Home Layout: One level Home Equipment: Agricultural Consultant (2 wheels);Cane - single point;Crutches      Prior Function Prior Level of Function : Independent/Modified Independent             Mobility Comments: recently using RW for increased support       Extremity/Trunk Assessment   Upper Extremity Assessment Upper Extremity Assessment: Overall WFL for tasks assessed  Lower Extremity Assessment Lower Extremity Assessment: RLE deficits/detail RLE Deficits / Details: KI in place - no knee flex per DR RLE: Unable to fully assess due to immobilization    Cervical / Trunk Assessment Cervical / Trunk Assessment: Normal  Communication   Communication Communication: No apparent difficulties    Cognition Arousal: Alert Behavior During Therapy: WFL for tasks  assessed/performed   PT - Cognitive impairments: No apparent impairments                         Following commands: Intact       Cueing Cueing Techniques: Verbal cues     General Comments      Exercises General Exercises - Lower Extremity Ankle Circles/Pumps: AROM, Both, 15 reps, Supine   Assessment/Plan    PT Assessment Patient needs continued PT services  PT Problem List Decreased strength;Decreased range of motion;Decreased activity tolerance;Decreased balance;Decreased mobility;Decreased knowledge of use of DME;Pain       PT Treatment Interventions DME instruction;Gait training;Stair training;Functional mobility training;Therapeutic activities;Therapeutic exercise;Patient/family education    PT Goals (Current goals can be found in the Care Plan section)  Acute Rehab PT Goals Patient Stated Goal: Regain IND PT Goal Formulation: With patient Time For Goal Achievement: 11/19/24 Potential to Achieve Goals: Good    Frequency 7X/week     Co-evaluation               AM-PAC PT 6 Clicks Mobility  Outcome Measure Help needed turning from your back to your side while in a flat bed without using bedrails?: A Little Help needed moving from lying on your back to sitting on the side of a flat bed without using bedrails?: A Little Help needed moving to and from a bed to a chair (including a wheelchair)?: A Little Help needed standing up from a chair using your arms (e.g., wheelchair or bedside chair)?: A Little Help needed to walk in hospital room?: A Little Help needed climbing 3-5 steps with a railing? : A Little 6 Click Score: 18    End of Session Equipment Utilized During Treatment: Gait belt;Right knee immobilizer Activity Tolerance: Patient tolerated treatment well Patient left: in chair;with call bell/phone within reach;with chair alarm set Nurse Communication: Mobility status PT Visit Diagnosis: Difficulty in walking, not elsewhere classified  (R26.2)    Time: 8882-8855 PT Time Calculation (min) (ACUTE ONLY): 27 min   Charges:   PT Evaluation $PT Eval Low Complexity: 1 Low PT Treatments $Gait Training: 8-22 mins PT General Charges $$ ACUTE PT VISIT: 1 Visit         [facility] PT Acute Rehabilitation Services Office [phone]   Zahir Eisenhour 11/12/2024, 1:11 PM

## 2024-11-12 NOTE — Progress Notes (Signed)
 Discharge instructions discussed with patient and family, verbalized agreement and understanding

## 2024-11-12 NOTE — Progress Notes (Signed)
 Physical Therapy Treatment Patient Details Name: Tyler Gates MRN: [id number] DOB: 10-Apr-1956 Today's Date: 11/12/2024   History of Present Illness Pt s/p R TKR reimplantation following TKR excision with spacer placement ~ 1 year ago.    PT Comments  Pt motivated and progressing well with mobility including up to ambulate increased distance in hall and negotiated stairs.  Pt hopeful for dc home this date but dependent on blood work.  Will follow.    If plan is discharge home, recommend the following: A little help with walking and/or transfers;A little help with bathing/dressing/bathroom;Assistance with cooking/housework;Assist for transportation   Can travel by private vehicle        Equipment Recommendations  None recommended by PT    Recommendations for Other Services       Precautions / Restrictions Precautions Precautions: Knee;Fall;Other (comment) Precaution/Restrictions Comments: NO R knee Flexion Required Braces or Orthoses: Knee Immobilizer - Right Knee Immobilizer - Right: On at all times Restrictions Weight Bearing Restrictions Per Provider Order: Yes RLE Weight Bearing Per Provider Order: Weight bearing as tolerated     Mobility  Bed Mobility Overal bed mobility: Needs Assistance Bed Mobility: Sit to Supine     Supine to sit: Contact guard Sit to supine: Supervision   General bed mobility comments: use of gait belt to manage R LE    Transfers Overall transfer level: Needs assistance Equipment used: Rolling walker (2 wheels) Transfers: Sit to/from Stand Sit to Stand: Supervision           General transfer comment: min cues for LE management and use of UEs to self assist    Ambulation/Gait Ambulation/Gait assistance: Contact guard assist, Supervision Gait Distance (Feet): 100 Feet Assistive device: Rolling walker (2 wheels) Gait Pattern/deviations: Step-to pattern, Decreased step length - right, Decreased step length - left, Shuffle Gait  velocity: decr     General Gait Details: min cues for sequence, posture and position from RW   Stairs Stairs: Yes Stairs assistance: Contact guard assist Stair Management: One rail Left, Step to pattern, Forwards, With cane Number of Stairs: 3 General stair comments: min cues for sequence   Wheelchair Mobility     Tilt Bed    Modified Rankin (Stroke Patients Only)       Balance Overall balance assessment: Needs assistance Sitting-balance support: Feet supported, No upper extremity supported Sitting balance-Leahy Scale: Good     Standing balance support: No upper extremity supported Standing balance-Leahy Scale: Fair                              Hotel Manager: No apparent difficulties  Cognition Arousal: Alert Behavior During Therapy: WFL for tasks assessed/performed   PT - Cognitive impairments: No apparent impairments                         Following commands: Intact      Cueing Cueing Techniques: Verbal cues  Exercises General Exercises - Lower Extremity Ankle Circles/Pumps: AROM, Both, 15 reps, Supine    General Comments        Pertinent Vitals/Pain      Home Living Family/patient expects to be discharged to:: Private residence Living Arrangements: Spouse/significant other Available Help at Discharge: Family;Available 24 hours/day Type of Home: House Home Access: Stairs to enter Entrance Stairs-Rails: Left Entrance Stairs-Number of Steps: 3   Home Layout: One level Home Equipment: Agricultural Consultant (2 wheels);Cane - single point;Crutches  Prior Function            PT Goals (current goals can now be found in the care plan section) Acute Rehab PT Goals Patient Stated Goal: Regain IND PT Goal Formulation: With patient Time For Goal Achievement: 11/19/24 Potential to Achieve Goals: Good Progress towards PT goals: Progressing toward goals    Frequency    7X/week      PT Plan       Co-evaluation              AM-PAC PT 6 Clicks Mobility   Outcome Measure  Help needed turning from your back to your side while in a flat bed without using bedrails?: A Little Help needed moving from lying on your back to sitting on the side of a flat bed without using bedrails?: A Little Help needed moving to and from a bed to a chair (including a wheelchair)?: A Little Help needed standing up from a chair using your arms (e.g., wheelchair or bedside chair)?: A Little Help needed to walk in hospital room?: A Little Help needed climbing 3-5 steps with a railing? : A Little 6 Click Score: 18    End of Session Equipment Utilized During Treatment: Gait belt;Right knee immobilizer Activity Tolerance: Patient tolerated treatment well Patient left: with call bell/phone within reach;in bed;with family/visitor present Nurse Communication: Mobility status PT Visit Diagnosis: Difficulty in walking, not elsewhere classified (R26.2)     Time: 8594-8574 PT Time Calculation (min) (ACUTE ONLY): 20 min  Charges:    $Gait Training: 8-22 mins PT General Charges $$ ACUTE PT VISIT: 1 Visit                     [facility] PT Acute Rehabilitation Services Office [phone]    Ah Bott 11/12/2024, 2:47 PM

## 2024-11-12 NOTE — TOC Transition Note (Signed)
 Transition of Care [facility]) - Discharge Note   Patient Details  Name: Tyler Gates MRN: [id number] Date of Birth: 1956-08-15  [facility] CM/SW Contact:  Alfonse JONELLE Rex, RN Phone Number: 11/12/2024, 12:42 PM   Clinical Narrative:   Patient admitted for scheduled  Right TKA, dc plan [facility], has RW. No INPT CM needs.     Final next level of care: OP Rehab Barriers to Discharge: No Barriers Identified   Patient Goals and CMS Choice Patient states their goals for this hospitalization and ongoing recovery are:: return home          Discharge Placement                       Discharge Plan and Services Additional resources added to the After Visit Summary for                                       Social Drivers of Health (SDOH) Interventions SDOH Screenings   Food Insecurity: No Food Insecurity (11/11/2024)  Housing: Low Risk  (11/11/2024)  Transportation Needs: No Transportation Needs (11/11/2024)  Utilities: Not At Risk (11/11/2024)  Alcohol  Screen: Low Risk  (08/19/2024)  Depression (PHQ2-9): Low Risk  (08/27/2024)  Financial Resource Strain: Low Risk  (08/19/2024)  Physical Activity: Sufficiently Active (08/19/2024)  Social Connections: Moderately Isolated (11/11/2024)  Stress: No Stress Concern Present (08/19/2024)  Tobacco Use: Low Risk  (11/11/2024)  Health Literacy: Adequate Health Literacy (08/19/2024)     Readmission Risk Interventions    11/12/2024   12:41 PM 10/02/2023    2:37 PM  Readmission Risk Prevention Plan  Post Dischage Appt Complete Complete  Medication Screening Complete Complete  Transportation Screening Complete Complete

## 2024-11-17 ENCOUNTER — Encounter (HOSPITAL_COMMUNITY): Payer: Self-pay | Admitting: Orthopedic Surgery

## 2024-11-22 ENCOUNTER — Other Ambulatory Visit: Payer: Self-pay | Admitting: Cardiology

## 2024-11-23 ENCOUNTER — Encounter: Payer: Self-pay | Admitting: Family Medicine

## 2024-11-23 ENCOUNTER — Other Ambulatory Visit: Payer: Self-pay | Admitting: Cardiology

## 2024-11-23 DIAGNOSIS — E785 Hyperlipidemia, unspecified: Secondary | ICD-10-CM

## 2024-11-24 ENCOUNTER — Encounter: Payer: Self-pay | Admitting: Cardiology

## 2024-11-24 DIAGNOSIS — E785 Hyperlipidemia, unspecified: Secondary | ICD-10-CM

## 2024-11-24 MED ORDER — EZETIMIBE 10 MG PO TABS: 10.0000 mg | ORAL_TABLET | Freq: Every day | ORAL | 2 refills | Status: AC

## 2024-11-24 MED ORDER — OLMESARTAN MEDOXOMIL 20 MG PO TABS: 20.0000 mg | ORAL_TABLET | Freq: Every day | ORAL | 2 refills | Status: AC

## 2024-12-01 ENCOUNTER — Other Ambulatory Visit: Payer: Self-pay | Admitting: Cardiology

## 2024-12-01 ENCOUNTER — Other Ambulatory Visit (HOSPITAL_COMMUNITY): Payer: Self-pay

## 2024-12-03 ENCOUNTER — Other Ambulatory Visit: Payer: Self-pay | Admitting: Cardiology

## 2024-12-10 ENCOUNTER — Ambulatory Visit (INDEPENDENT_AMBULATORY_CARE_PROVIDER_SITE_OTHER): Admitting: *Deleted

## 2024-12-10 VITALS — BP 129/76 | HR 92 | Temp 98.3°F | Resp 16 | Ht 69.0 in | Wt 197.4 lb

## 2024-12-10 DIAGNOSIS — K5010 Crohn's disease of large intestine without complications: Secondary | ICD-10-CM | POA: Diagnosis not present

## 2024-12-10 MED ORDER — SODIUM CHLORIDE 0.9 % IV SOLN
5.0000 mg/kg | Freq: Once | INTRAVENOUS | Status: AC
  Administered 2024-12-10: 400 mg via INTRAVENOUS
  Filled 2024-12-10: qty 40

## 2024-12-10 MED ORDER — METHYLPREDNISOLONE SODIUM SUCC 40 MG IJ SOLR
40.0000 mg | Freq: Once | INTRAMUSCULAR | Status: AC
  Administered 2024-12-10: 40 mg via INTRAVENOUS
  Filled 2024-12-10: qty 1

## 2024-12-10 MED ORDER — ACETAMINOPHEN 325 MG PO TABS
650.0000 mg | ORAL_TABLET | Freq: Once | ORAL | Status: AC
  Administered 2024-12-10: 650 mg via ORAL
  Filled 2024-12-10: qty 2

## 2024-12-10 MED ORDER — DIPHENHYDRAMINE HCL 25 MG PO CAPS: 25.0000 mg | ORAL_CAPSULE | Freq: Once | ORAL | Status: DC

## 2024-12-10 NOTE — Progress Notes (Cosign Needed)
 Diagnosis:  Crohn's Disease  Provider:  Mannam, Praveen MD  Procedure: IV Infusion  IV Type: Peripheral, IV Location: L Hand   Remicade  (Infliximab ), Dose: 400 mg  Infusion Start Time: 0917 am  Infusion Stop Time: 1145 am  Post Infusion IV Care: Observation period completed and Peripheral IV Discontinued  Discharge: Condition: Good, Destination: Home . AVS Declined  Performed by:  Trudy Lamarr LABOR, RN

## 2024-12-17 ENCOUNTER — Telehealth: Payer: Self-pay

## 2024-12-17 NOTE — Telephone Encounter (Signed)
 Dentist office advised

## 2024-12-17 NOTE — Telephone Encounter (Signed)
 Copied from CRM #[id number]. Topic: Clinical - Prescription Issue >> Dec 17, 2024 11:30 AM [place] MATSU wrote: Reason for CRM: Tiffany from [facility] called in stating that patient has an oral infection and they are wanting to prescriber Clindamycin but patient takes cipro  and they want to know will he hold the cipro  or take both. They are requesting a call back with further instructions.   May you please assist.

## 2024-12-28 ENCOUNTER — Encounter: Payer: Self-pay | Admitting: Family Medicine

## 2024-12-28 ENCOUNTER — Ambulatory Visit: Admitting: Family Medicine

## 2024-12-28 VITALS — BP 135/79 | HR 97 | Ht 69.0 in | Wt 198.0 lb

## 2024-12-28 DIAGNOSIS — L308 Other specified dermatitis: Secondary | ICD-10-CM | POA: Diagnosis not present

## 2024-12-28 DIAGNOSIS — T8453XD Infection and inflammatory reaction due to internal right knee prosthesis, subsequent encounter: Secondary | ICD-10-CM

## 2024-12-28 DIAGNOSIS — L309 Dermatitis, unspecified: Secondary | ICD-10-CM | POA: Insufficient documentation

## 2024-12-28 DIAGNOSIS — T8450XD Infection and inflammatory reaction due to unspecified internal joint prosthesis, subsequent encounter: Secondary | ICD-10-CM

## 2024-12-28 MED ORDER — CLOBETASOL PROPIONATE 0.05 % EX CREA: 1.0000 | TOPICAL_CREAM | Freq: Two times a day (BID) | CUTANEOUS | 2 refills | Status: AC

## 2024-12-28 NOTE — Assessment & Plan Note (Signed)
 History of right knee prosthesis infection.  Since status post revision.  He was on Cipro  however experienced potential side effects related to this including neuropathic symptoms.  He has discontinued this.  Will reach out to his orthopedic surgeon as well infectious disease to see what alternatives they would prefer.

## 2024-12-28 NOTE — Progress Notes (Signed)
 " Tyler Gates. - 69 y.o. male MRN [id number]  Date of birth: 1956-12-02  Subjective Chief Complaint  Patient presents with   Rash   Medication Reaction    HPI Tyler Gates. Is a 69 y.o. male here today for follow up.  History of knee prosthesis infection.  Had antibiotic spacer for quite some time and recently had revision.  Was placed on cipro  for extended course to prevent re-infection.  He reports that he has developed numbness and tingling in his fingers over the past several weeks so he discontinued cipro  as of 12/23/24.  Some improvement since stopping this.    He also has had rash on hands.  Rash is itchy with dry, cracked area.  Denies significant pain.  No weeping of drainage from areas.   ROS:  A comprehensive ROS was completed and negative except as noted per HPI  Allergies[1]  Past Medical History:  Diagnosis Date   Arthritis    Crohn's disease (HCC)    Depression    Dyspnea on exertion    GERD (gastroesophageal reflux disease)    Herpes    History of kidney stones    Hyperlipidemia    Hypertension    Instability of knee joint    Left displaced femoral neck fracture (HCC) 07/11/2017   OSA (obstructive sleep apnea)    mild   Snoring     Past Surgical History:  Procedure Laterality Date   COLONOSCOPY  2007   Dr. Zena normal TI, ascending colon/cecum with mild inflammation noted but biopses without signs of active inflammation, hyperpastic polyp, possible ischemia on path of left colon biopsy   COLONOSCOPY N/A 11/08/2015   Procedure: COLONOSCOPY;  Surgeon: Lamar CHRISTELLA Hollingshead, MD;  Location: [facility];  Service: Endoscopy;  Laterality: N/A;   ESOPHAGOGASTRODUODENOSCOPY  2007   Dr. Zena reflux esophagitis, hiatal hernia, negative H.pylori   ESOPHAGOGASTRODUODENOSCOPY N/A 11/08/2015   Procedure: ESOPHAGOGASTRODUODENOSCOPY (EGD);  Surgeon: Lamar CHRISTELLA Hollingshead, MD;  Location: [facility];  Service: Endoscopy;  Laterality: N/A;  0830 - moved to 8:15 -  office to notify   EXCISIONAL TOTAL KNEE ARTHROPLASTY WITH ANTIBIOTIC SPACERS Right 10/01/2023   Procedure: EXCISIONAL TOTAL KNEE ARTHROPLASTY WITH PLACEMENT OF ARTICULATING ANTIBIOTIC SPACER;  Surgeon: Fidel Rogue, MD;  Location: [facility];  Service: Orthopedics;  Laterality: Right;   HIP PINNING,CANNULATED Left 07/12/2017   Procedure: CANNULATED HIP PINNING LEFT;  Surgeon: Fidel Rogue, MD;  Location: [facility];  Service: Orthopedics;  Laterality: Left;   KNEE ARTHROPLASTY Right 04/23/2023   Procedure: COMPUTER ASSISTED TOTAL KNEE ARTHROPLASTY;  Surgeon: Fidel Rogue, MD;  Location: [facility];  Service: Orthopedics;  Laterality: Right;  160   TOTAL KNEE REVISION Right 11/11/2024   Procedure: TOTAL KNEE REVISION;  Surgeon: Fidel Rogue, MD;  Location: [facility];  Service: Orthopedics;  Laterality: Right;  Resection right knee scpart and Revision Right total knee arthroplasty    Social History   Socioeconomic History   Marital status: Married    Spouse name: Tyler Gates   Number of children: 2   Years of education: 14   Highest education level: Some college, no degree  Occupational History   Occupation: Retired  Tobacco Use   Smoking status: Never   Smokeless tobacco: Never  Vaping Use   Vaping status: Never Used  Substance and Sexual Activity   Alcohol  use: Yes    Alcohol /week: 1.0 standard drink of alcohol     Types: 1 Glasses of wine per week  Comment: occas   Drug use: No   Sexual activity: Not Currently    Birth control/protection: None  Other Topics Concern   Not on file  Social History Narrative   Lives with wife. He has two children. He enjoys cycling, kayaking and working on model cars.   Social Drivers of Health   Tobacco Use: Low Risk (11/11/2024)   Patient History    Smoking Tobacco Use: Never    Smokeless Tobacco Use: Never    Passive Exposure: Not on file  Financial Resource Strain: Low Risk (08/19/2024)   Overall Financial Resource Strain (CARDIA)     Difficulty of Paying Living Expenses: Not hard at all  Food Insecurity: No Food Insecurity (11/11/2024)   Epic    Worried About Programme Researcher, Broadcasting/film/video in the Last Year: Never true    Ran Out of Food in the Last Year: Never true  Transportation Needs: No Transportation Needs (11/11/2024)   Epic    Lack of Transportation (Medical): No    Lack of Transportation (Non-Medical): No  Physical Activity: Sufficiently Active (08/19/2024)   Exercise Vital Sign    Days of Exercise per Week: 3 days    Minutes of Exercise per Session: 50 min  Stress: No Stress Concern Present (08/19/2024)   [employer] of Occupational Health - Occupational Stress Questionnaire    Feeling of Stress: Only a little  Social Connections: Moderately Isolated (11/11/2024)   Social Connection and Isolation Panel    Frequency of Communication with Friends and Family: Twice a week    Frequency of Social Gatherings with Friends and Family: Twice a week    Attends Religious Services: Never    Database Administrator or Organizations: No    Attends Banker Meetings: Never    Marital Status: Married  Depression (PHQ2-9): Low Risk (12/28/2024)   Depression (PHQ2-9)    PHQ-2 Score: 2  Alcohol  Screen: Low Risk (08/19/2024)   Alcohol  Screen    Last Alcohol  Screening Score (AUDIT): 0  Housing: Low Risk (11/11/2024)   Epic    Unable to Pay for Housing in the Last Year: No    Number of Times Moved in the Last Year: 0    Homeless in the Last Year: No  Utilities: Not At Risk (11/11/2024)   Epic    Threatened with loss of utilities: No  Health Literacy: Adequate Health Literacy (08/19/2024)   B1300 Health Literacy    Frequency of need for help with medical instructions: Never    Family History  Problem Relation Age of Onset   Melanoma Father    Heart disease Father    Healthy Mother    Colon cancer Neg Hx    Esophageal cancer Neg Hx     Health Maintenance  Topic Date Due   Influenza Vaccine  03/22/2025  (Originally 07/23/2024)   Zoster Vaccines- Shingrix (1 of 2) 03/28/2025 (Originally 11/17/1974)   COVID-19 Vaccine (4 - 2025-26 season) 08/22/2025 (Originally 08/23/2024)   DTaP/Tdap/Td (1 - Tdap) 08/27/2025 (Originally 11/17/1974)   Pneumococcal Vaccine: 50+ Years (1 of 2 - PCV) 12/28/2025 (Originally 11/17/1974)   Medicare Annual Wellness (AWV)  08/19/2025   Colonoscopy  05/24/2030   Hepatitis C Screening  Completed   Meningococcal B Vaccine  Aged Out   Hepatitis B Vaccines 19-59 Average Risk  Discontinued     ----------------------------------------------------------------------------------------------------------------------------------------------------------------------------------------------------------------- Physical Exam BP 135/79 (BP Location: Left Arm, Patient Position: Sitting, Cuff Size: Normal)   Pulse 97   Ht 5' 9 (1.753  m)   Wt 198 lb (89.8 kg)   SpO2 99%   BMI 29.24 kg/m   Physical Exam Constitutional:      Appearance: Normal appearance.  HENT:     Head: Normocephalic and atraumatic.  Eyes:     General: No scleral icterus. Cardiovascular:     Rate and Rhythm: Normal rate and regular rhythm.  Pulmonary:     Effort: Pulmonary effort is normal.     Breath sounds: Normal breath sounds.  Skin:    Comments: Dried, cracked areas on b/l hands.   Neurological:     General: No focal deficit present.     Mental Status: He is alert.     ------------------------------------------------------------------------------------------------------------------------------------------------------------------------------------------------------------------- Assessment and Plan  Infection of total right knee replacement History of right knee prosthesis infection.  Since status post revision.  He was on Cipro  however experienced potential side effects related to this including neuropathic symptoms.  He has discontinued this.  Will reach out to his orthopedic surgeon as well  infectious disease to see what alternatives they would prefer.  Eczema Prescription for clobetasol  to use on hands.  Ask him to let me know if symptoms or not improving with this.   Meds ordered this encounter  Medications   clobetasol  cream (TEMOVATE ) 0.05 %    Sig: Apply 1 Application topically 2 (two) times daily.    Dispense:  60 g    Refill:  2    No follow-ups on file.         [1]  Allergies Allergen Reactions   Sulfasalazine  Other (See Comments)    Flu like feelings   Statins Other (See Comments)    REACTION: Myalgia   "

## 2024-12-28 NOTE — Assessment & Plan Note (Signed)
 Prescription for clobetasol  to use on hands.  Ask him to let me know if symptoms or not improving with this.

## 2024-12-28 NOTE — Patient Instructions (Signed)
 Itchy, Irritated Skin (Eczema): What to Know Eczema is a group of skin conditions that cause rough and inflamed skin. There are different types of eczema. They each have different triggers, symptoms, and treatments. Eczema is not contagious, so it doesn't spread from person to person. It can appear on different parts of your body at different times. It doesn't look the same on everyone. What are the causes? The exact cause of eczema isn't known. Things that can make it worse includes: Irritants. Allergens. What are the signs or symptoms? Symptoms depend on the type of eczema you have. They can range from mild to very bad. Symptoms often include: Itchiness. Dry skin. Rash or skin bumps. Swelling. Crusty, flaky, or scaly patches. Thick patches of skin. Oozing skin or blisters. How is this diagnosed? This condition may be diagnosed based on: Symptoms. Physical exam. Medical history. Skin patch tests that use allergen patches on your back to check for allergic reactions. You may need to see a skin specialist called a dermatologist. This specialist can help diagnose and treat this condition. How is this treated? There's no cure for eczema, but you can manage your symptoms. Treatment depends on the type of eczema you have. Options may include: Medicines to lessen itching (antihistamines). Medicine to put on your skin to lessen swelling and irritation (corticosteroid creams or ointments). Light therapy, also called phototherapy. The affected skin is put under ultraviolet (UV) light. Medicines may be prescribed or purchased at the store. This will depend on the strength that's needed. Follow these instructions at home: Skin Care Use skin creams or lotions as told. Do not scratch your skin. This can make your rash worse. Keep fingernails short to avoid scratching open the skin. General instructions Take or apply your medicines as told. Avoid triggers and allergens. Treat symptoms fast if  you have a flare-up. Keep all follow-up visits to make sure your treatment plan is working. Where to find more information American Academy of Dermatology: MarketingSheets.si National Eczema Association: nationaleczema.org The Society for Pediatric Dermatology: pedsderm.net Contact a health care provider if: You have very bad itching even with treatment. You often scratch your skin until it bleeds. Your rash looks different than normal. You have a fever. You have symptoms that don't go away with treatment. You have more redness, pain, or swelling over the affected skin. You have warmth or pus coming from the affected skin. This information is not intended to replace advice given to you by your health care provider. Make sure you discuss any questions you have with your health care provider. Document Revised: 05/13/2023 Document Reviewed: 05/13/2023 Elsevier Patient Education  2024 [employer].

## 2024-12-31 NOTE — Progress Notes (Unsigned)
 " OUTPATIENT PHYSICAL THERAPY LOWER EXTREMITY EVALUATION   Patient Name: Tyler Gates. MRN: [id number] DOB:09/02/1956, 69 y.o., male Today's Date: 12/31/2024  END OF SESSION:   Past Medical History:  Diagnosis Date   Arthritis    Crohn's disease (HCC)    Depression    Dyspnea on exertion    GERD (gastroesophageal reflux disease)    Herpes    History of kidney stones    Hyperlipidemia    Hypertension    Instability of knee joint    Left displaced femoral neck fracture (HCC) 07/11/2017   OSA (obstructive sleep apnea)    mild   Snoring    Past Surgical History:  Procedure Laterality Date   COLONOSCOPY  2007   Dr. Zena normal TI, ascending colon/cecum with mild inflammation noted but biopses without signs of active inflammation, hyperpastic polyp, possible ischemia on path of left colon biopsy   COLONOSCOPY N/A 11/08/2015   Procedure: COLONOSCOPY;  Surgeon: Lamar CHRISTELLA Hollingshead, MD;  Location: [facility];  Service: Endoscopy;  Laterality: N/A;   ESOPHAGOGASTRODUODENOSCOPY  2007   Dr. Zena reflux esophagitis, hiatal hernia, negative H.pylori   ESOPHAGOGASTRODUODENOSCOPY N/A 11/08/2015   Procedure: ESOPHAGOGASTRODUODENOSCOPY (EGD);  Surgeon: Lamar CHRISTELLA Hollingshead, MD;  [facility];  Service: Endoscopy;  Laterality: N/A;  0830 - moved to 8:15 - office to notify   EXCISIONAL TOTAL KNEE ARTHROPLASTY WITH ANTIBIOTIC SPACERS Right 10/01/2023   Procedure: EXCISIONAL TOTAL KNEE ARTHROPLASTY WITH PLACEMENT OF ARTICULATING ANTIBIOTIC SPACER;  Surgeon: Fidel Rogue, MD;  Location: [facility];  Service: Orthopedics;  Laterality: Right;   HIP PINNING,CANNULATED Left 07/12/2017   Procedure: CANNULATED HIP PINNING LEFT;  Surgeon: Fidel Rogue, MD;  Location: [facility];  Service: Orthopedics;  Laterality: Left;   KNEE ARTHROPLASTY Right 04/23/2023   Procedure: COMPUTER ASSISTED TOTAL KNEE ARTHROPLASTY;  Surgeon: Fidel Rogue, MD;  Location: [facility];  Service: Orthopedics;  Laterality:  Right;  160   TOTAL KNEE REVISION Right 11/11/2024   Procedure: TOTAL KNEE REVISION;  Surgeon: Fidel Rogue, MD;  Location: [facility];  Service: Orthopedics;  Laterality: Right;  Resection right knee scpart and Revision Right total knee arthroplasty   Patient Active Problem List   Diagnosis Date Noted   Eczema 12/28/2024   S/P revision of total knee, right 11/11/2024   Diarrhea 10/10/2023   Prosthetic joint infection 10/10/2023   Medication management 10/10/2023   Hypotension due to hypovolemia 10/10/2023   Infection of total right knee replacement, subsequent encounter 10/01/2023   Infection of prosthetic right knee joint 09/22/2023   Infection of total right knee replacement 08/27/2023   Rash 08/01/2023   Body aches 08/01/2023   Osteoarthritis of right knee 04/23/2023   Preoperative clearance 02/11/2023   Chronic sinusitis 03/12/2022   Visual disturbance 03/12/2022   Acute sinusitis 01/29/2022   Chronic kidney disease (CKD) stage G2/A2, mildly decreased glomerular filtration rate (GFR) between 60-89 mL/min/1.73 square meter and albuminuria creatinine ratio between 30-299 mg/g 10/22/2018   Hypogonadism male 10/21/2018   Screening PSA (prostate specific antigen) 10/21/2018   Chronic kidney disease due to benign hypertension 10/21/2018   Depression, major, recurrent, in remission 10/21/2018   Statin intolerance 09/14/2018   Agatston coronary artery calcium  score greater than 400 08/10/2018   Family history of coronary artery disease 08/10/2018   Crohn's disease of colon without complication (HCC) 05/09/2017   Mucosal abnormality of esophagus    Hiatal hernia    KNEE JOINT INSTABILITY 03/07/2007   Hyperlipidemia 12/30/2006   Essential  hypertension 12/30/2006   GERD 12/30/2006    PCP: Dr Velma Ku  REFERRING PROVIDER: Dr Redell Shoals  REFERRING DIAG: aftercare following R TKA  THERAPY DIAG:  No diagnosis found.  Rationale for Evaluation and Treatment:  Rehabilitation  ONSET DATE: 11/11/24  SUBJECTIVE:   SUBJECTIVE STATEMENT: Patient underwent R TKA 04/23/23 and was seen for rehab through 06/05/23. He was independent in HEP and riding his bike 8 miles a day when is finished therapy. He developed infection in R knee and underwent TKA revision 11/11/24  PERTINENT HISTORY: HTN; Crones disease; fx of Lt femoral neck with ORIF ~ 5 yrs ago  PAIN:  Are you having pain? Yes: NPRS scale: *** Pain location: *** Pain description: *** Aggravating factors: *** Relieving factors: ***  PRECAUTIONS: {Therapy precautions:24002}  RED FLAGS: {PT Red Flags:29287}   WEIGHT BEARING RESTRICTIONS: No  FALLS:  Has patient fallen in last 6 months? 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  DIAGNOSTIC FINDINGS: xray 11/11/24 - Expected appearance after redo knee arthroplasty.   PATIENT SURVEYS:  {rehab surveys:24030}  COGNITION: Overall cognitive status: Within functional limits for tasks assessed     SENSATION: {sensation:27233}  EDEMA:  {edema:24020}  MUSCLE LENGTH: Hamstrings: Right *** deg; Left *** deg Debby test: Right *** deg; Left *** deg  POSTURE: rounded shoulders, forward head, flexed trunk , and weight shift left  PALPATION: ***  LOWER EXTREMITY ROM:  Active ROM Right eval Left eval  Hip flexion    Hip extension    Hip abduction    Hip adduction    Hip internal rotation    Hip external rotation    Knee  flexion    Knee extension    Ankle dorsiflexion    Ankle plantarflexion    Ankle inversion    Ankle eversion     (Blank rows = not tested)  LOWER EXTREMITY MMT:  MMT Right eval Left eval  Hip flexion    Hip extension    Hip abduction    Hip adduction    Hip internal rotation    Hip external rotation    Knee flexion    Knee extension    Ankle dorsiflexion    Ankle plantarflexion    Ankle inversion    Ankle eversion     (Blank rows = not tested)  FUNCTIONAL TESTS:  5 times sit to stand: *** SLS:   GAIT: Distance walked: 40 ft Assistive device utilized: {Assistive devices:23999} Level of assistance: {Levels of assistance:24026} Comments: ***  TREATMENT DATE: evaluation; POC; HEP as noted below     PATIENT EDUCATION:  Education details: POC; HEP  Person educated: Patient Education method: Explanation, Demonstration, Tactile cues, Verbal cues, and Handouts Education comprehension: verbalized understanding, returned demonstration, verbal cues required, tactile cues required, and needs further education  HOME EXERCISE PROGRAM: ***  ASSESSMENT:  CLINICAL IMPRESSION: Patient is a 69 y.o. male who was seen today for physical therapy evaluation and treatment s/p R TKA revision 11/11/24.   OBJECTIVE IMPAIRMENTS: {opptimpairments:25111}.   ACTIVITY LIMITATIONS: {activitylimitations:27494}  PARTICIPATION LIMITATIONS: {participationrestrictions:25113}  PERSONAL FACTORS: {Personal factors:25162} are also affecting patient's functional outcome.   REHAB POTENTIAL: Good  CLINICAL DECISION MAKING: Evolving/moderate complexity  EVALUATION COMPLEXITY: Moderate   GOALS: Goals reviewed with patient? Yes  SHORT TERM GOALS: Target date: *** Independent in initial HEP  Baseline: Goal status: INITIAL  2.  *** Baseline:  Goal status:  INITIAL  3.  *** Baseline:  Goal status: INITIAL   LONG TERM GOALS: Target date: ***  *** Baseline:  Goal status: INITIAL  2.  *** Baseline:  Goal status: INITIAL  3.  *** Baseline:  Goal status: INITIAL  4.  *** Baseline:  Goal status: INITIAL  5.  *** Baseline:  Goal status: INITIAL  6.  *** Baseline:  Goal status: INITIAL   PLAN:  PT FREQUENCY: 1-2x/week  PT DURATION: 8 weeks  PLANNED INTERVENTIONS: 97164- PT Re-evaluation, 97110-Therapeutic exercises, 97530- Therapeutic activity, 97112- Neuromuscular re-education, 97535- Self Care, 02859- Manual therapy, [id number]- Gait training, [phone]- Aquatic Therapy, [phone]- Vasopneumatic device, Patient/Family education, Balance training, Stair training, Taping, and Joint mobilization  PLAN FOR NEXT SESSION: review and progress exercises; gait training and stair training as indicated   Myesha Stillion SHAUNNA Baptist, PT 12/31/2024, 8:23 PM  "

## 2025-01-02 ENCOUNTER — Other Ambulatory Visit: Payer: Self-pay | Admitting: Cardiology

## 2025-01-02 DIAGNOSIS — E785 Hyperlipidemia, unspecified: Secondary | ICD-10-CM

## 2025-01-03 ENCOUNTER — Other Ambulatory Visit: Payer: Self-pay

## 2025-01-03 ENCOUNTER — Ambulatory Visit: Attending: Family Medicine | Admitting: Rehabilitative and Restorative Service Providers"

## 2025-01-03 ENCOUNTER — Encounter: Payer: Self-pay | Admitting: Rehabilitative and Restorative Service Providers"

## 2025-01-03 DIAGNOSIS — Z471 Aftercare following joint replacement surgery: Secondary | ICD-10-CM | POA: Diagnosis present

## 2025-01-03 DIAGNOSIS — M25561 Pain in right knee: Secondary | ICD-10-CM

## 2025-01-03 DIAGNOSIS — M6281 Muscle weakness (generalized): Secondary | ICD-10-CM

## 2025-01-03 DIAGNOSIS — R600 Localized edema: Secondary | ICD-10-CM

## 2025-01-03 DIAGNOSIS — R2689 Other abnormalities of gait and mobility: Secondary | ICD-10-CM

## 2025-01-03 DIAGNOSIS — M2560 Stiffness of unspecified joint, not elsewhere classified: Secondary | ICD-10-CM

## 2025-01-05 ENCOUNTER — Ambulatory Visit

## 2025-01-05 DIAGNOSIS — Z471 Aftercare following joint replacement surgery: Secondary | ICD-10-CM | POA: Diagnosis not present

## 2025-01-05 DIAGNOSIS — R2689 Other abnormalities of gait and mobility: Secondary | ICD-10-CM

## 2025-01-05 DIAGNOSIS — M2560 Stiffness of unspecified joint, not elsewhere classified: Secondary | ICD-10-CM

## 2025-01-05 DIAGNOSIS — M6281 Muscle weakness (generalized): Secondary | ICD-10-CM

## 2025-01-05 DIAGNOSIS — R600 Localized edema: Secondary | ICD-10-CM

## 2025-01-05 DIAGNOSIS — M25561 Pain in right knee: Secondary | ICD-10-CM

## 2025-01-05 NOTE — Therapy (Signed)
 " OUTPATIENT PHYSICAL THERAPY LOWER EXTREMITY TREATMENT     Patient Name: Tyler Gates. MRN: [id number] DOB:[birth year], 69 y.o., male Today's Date: 01/03/2025   END OF SESSION:  PT End of Session - 01/05/25 1010     Visit Number 2    Number of Visits 16    Date for Recertification  02/28/25    Authorization Type [employer] $[id number] auth required    Progress Note Due on Visit 10    PT Start Time 1015    PT Stop Time 1104    PT Time Calculation (min) 49 min    Activity Tolerance Patient tolerated treatment well    Behavior During Therapy WFL for tasks assessed/performed               Past Medical History:  Diagnosis Date   Arthritis     Crohn's disease (HCC)     Depression     Dyspnea on exertion     GERD (gastroesophageal reflux disease)     Herpes     History of kidney stones     Hyperlipidemia     Hypertension     Instability of knee joint     Left displaced femoral neck fracture (HCC) 07/11/2017   OSA (obstructive sleep apnea)      mild   Snoring               Past Surgical History:  Procedure Laterality Date   COLONOSCOPY   2007    Dr. Zena normal TI, ascending colon/cecum with mild inflammation noted but biopses without signs of active inflammation, hyperpastic polyp, possible ischemia on path of left colon biopsy   COLONOSCOPY N/A 11/08/2015    Procedure: COLONOSCOPY;  Surgeon: Lamar CHRISTELLA Hollingshead, MD;  Location: [facility];  Service: Endoscopy;  Laterality: N/A;   ESOPHAGOGASTRODUODENOSCOPY   2007    Dr. Zena reflux esophagitis, hiatal hernia, negative H.pylori   ESOPHAGOGASTRODUODENOSCOPY N/A 11/08/2015    Procedure: ESOPHAGOGASTRODUODENOSCOPY (EGD);  Surgeon: Lamar CHRISTELLA Hollingshead, MD;  Location: [facility];  Service: Endoscopy;  Laterality: N/A;  0830 - moved to 8:15 - office to notify   EXCISIONAL TOTAL KNEE ARTHROPLASTY WITH ANTIBIOTIC SPACERS Right 10/01/2023    Procedure: EXCISIONAL TOTAL KNEE ARTHROPLASTY WITH PLACEMENT OF ARTICULATING ANTIBIOTIC SPACER;   Surgeon: Fidel Rogue, MD;  Location: [facility];  Service: Orthopedics;  Laterality: Right;   HIP PINNING,CANNULATED Left 07/12/2017    Procedure: CANNULATED HIP PINNING LEFT;  Surgeon: Fidel Rogue, MD;  Location: [facility];  Service: Orthopedics;  Laterality: Left;   KNEE ARTHROPLASTY Right 04/23/2023    Procedure: COMPUTER ASSISTED TOTAL KNEE ARTHROPLASTY;  Surgeon: Fidel Rogue, MD;  Location: [facility];  Service: Orthopedics;  Laterality: Right;  160   TOTAL KNEE REVISION Right 11/11/2024    Procedure: TOTAL KNEE REVISION;  Surgeon: Fidel Rogue, MD;  Location: [facility];  Service: Orthopedics;  Laterality: Right;  Resection right knee scpart and Revision Right total knee arthroplasty            Patient Active Problem List    Diagnosis Date Noted   Eczema 12/28/2024   S/P revision of total knee, right 11/11/2024   Diarrhea 10/10/2023   Prosthetic joint infection 10/10/2023   Medication management 10/10/2023   Hypotension due to hypovolemia 10/10/2023   Infection of total right knee replacement, subsequent encounter 10/01/2023   Infection of prosthetic right knee joint 09/22/2023   Infection of total right knee replacement 08/27/2023   Rash 08/01/2023  Body aches 08/01/2023   Osteoarthritis of right knee 04/23/2023   Preoperative clearance 02/11/2023   Chronic sinusitis 03/12/2022   Visual disturbance 03/12/2022   Acute sinusitis 01/29/2022   Chronic kidney disease (CKD) stage G2/A2, mildly decreased glomerular filtration rate (GFR) between 60-89 mL/min/1.73 square meter and albuminuria creatinine ratio between 30-299 mg/g 10/22/2018   Hypogonadism male 10/21/2018   Screening PSA (prostate specific antigen) 10/21/2018   Chronic kidney disease due to benign hypertension 10/21/2018   Depression, major, recurrent, in remission 10/21/2018   Statin intolerance 09/14/2018   Agatston coronary artery calcium  score greater than 400 08/10/2018   Family history of coronary artery disease  08/10/2018   Crohn's disease of colon without complication (HCC) 05/09/2017   Mucosal abnormality of esophagus     Hiatal hernia     KNEE JOINT INSTABILITY 03/07/2007   Hyperlipidemia 12/30/2006   Essential hypertension 12/30/2006   GERD 12/30/2006      PCP: Dr Velma Ku   REFERRING PROVIDER: Dr Redell Shoals   REFERRING DIAG: aftercare following R TKA   THERAPY DIAG:  Acute pain of right knee   Stiffness in joint   Muscle weakness (generalized)   Localized edema   Other abnormalities of gait and mobility   Rationale for Evaluation and Treatment: Rehabilitation   ONSET DATE: 11/11/24   SUBJECTIVE:    SUBJECTIVE STATEMENT: Patient reports he feels weak in Rt leg; states he is able to step through when going up/down stairs. States he can have clicking in knee  EVAL: Patient underwent R TKA 04/23/23 and was seen for rehab through 06/05/23. He was independent in HEP and riding his bike 8 miles a day when is finished therapy. He developed infection in R knee and had articulating spacer placed and treated with antibiotics until 10/24; then underwent TKA revision 11/11/24. He is doing well following TKA. He is ambulating without assistive device and doing well. He does notice there is loss of muscle. He has been riding his some since recent surgery. He has hiked 2+ miles with a cane.   PERTINENT HISTORY: HTN; Crones disease; fx of Lt femoral neck with ORIF ~ 5 yrs ago  PAIN:  Are you having pain? Yes: NPRS scale: 0/10 Pain location: R knee  Pain description: tight with use Aggravating factors: increased standing and walking  Relieving factors: rest    PRECAUTIONS: None   RED FLAGS: None      WEIGHT BEARING RESTRICTIONS: No   FALLS:  Has patient fallen in last 6 months? Yes. 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   DIAGNOSTIC FINDINGS: xray 11/11/24 - Expected appearance after redo knee arthroplasty.    PATIENT SURVEYS:  LEFS: 47/80; 58.8%   COGNITION: Overall cognitive status: Within functional limits for tasks assessed  SENSATION: Slight numbness lateral knee intermittently    EDEMA:  Area of edema; pocketing of tissue medial R knee which has persisted through 3 surgeries    MUSCLE LENGTH: Hamstrings: Right 65 deg; Left 65 deg Tight hip flexors R > L    POSTURE: rounded shoulders, forward head, flexed trunk , and weight shift left   PALPATION: Note clicking and increased lateral and medial translation of patella. Note area of spongy tissue medial knee just medial to patella    LOWER EXTREMITY ROM:   Active ROM Right eval Left eval  Hip flexion [facility]  Hip extension [facility]  Hip abduction      Hip adduction      Hip internal rotation      Hip external rotation      Knee flexion 124 130  Knee extension -4 +5  Ankle dorsiflexion      Ankle plantarflexion      Ankle inversion      Ankle eversion       (Blank rows = not tested)   LOWER EXTREMITY MMT:                       Note extensor lag R LE with SLR ~ 10 deg  MMT Right eval Left eval  Hip flexion 4 5  Hip extension 4 5  Hip abduction 4 5  Hip adduction      Hip internal rotation      Hip external rotation      Knee flexion 4 5  Knee extension 4 5  Ankle dorsiflexion      Ankle  plantarflexion      Ankle inversion      Ankle eversion       (Blank rows = not tested)   FUNCTIONAL TESTS:  5 times sit to stand: 15.81 sec with weight shift to L  SLS: R LE 8 sec with initial UE support; L LE 10 sec    GAIT: Distance walked: 40 ft Assistive device utilized: None Level of assistance: Complete Independence Comments: slight limp R LE in wt bearing without full extension of R knee in stance to push off phase      [facility] Adult PT Treatment:                                                DATE: 01/05/2025 Therapeutic Exercise: Recumbent bike L1 x 4 min warm-up Prone quad stretch with strap + towel roll above knee Roller stick for self-massage --> quads Manual Therapy: Kinesiotape for edema (Rt) knee Neuromuscular re-ed: Quad set Small range SLR --> parallel & ER SAQ with bolster Standing TKE with ball  Therapeutic Activity: Seated heel slide for knee flexion Runner's step up on 4 step  Resisted hip abd + blue TB around thighs x12 Resisted hip extension + blue TB around thighs x12  TREATMENT DATE: evaluation; POC; HEP as noted below       PATIENT EDUCATION:  Education details: Updated HEP Person educated: Patient Education method: Explanation, Demonstration, Tactile cues, Verbal cues, and Handouts Education comprehension: verbalized understanding, returned demonstration, verbal cues required, tactile cues required, and needs further education   HOME EXERCISE PROGRAM: Access Code: [email or web address] URL: [email or web address] Date: 01/05/2025 Prepared by: Lamarr Price  Exercises - Prone Quadriceps Stretch with Strap  - 1 x daily - 7 x weekly - 1 sets - 3 reps - 30 sec  hold - Hooklying Hamstring Stretch with Strap  - 1 x daily - 7 x weekly - 1 sets - 3 reps - 30 sec  hold - Supine ITB Stretch with Strap  - 1 x daily - 7 x  weekly - 1 sets - 3 reps - 30 sec  hold - Supine Quad Set  - 2 x daily - 7 x weekly - 1 sets - 10 reps - 3 sec  hold - Supine Knee Extension Strengthening  - 2 x daily - 7 x weekly - 1-2 sets - 10 reps - 5 sec  hold - Small Range Straight Leg Raise  - 2 x daily - 7 x weekly - 1 sets - 10 reps - 5 sec  hold - Sit to Stand  - 2 x daily - 7 x weekly - 1 sets - 10 reps - 3-5 sec  hold - Standing Terminal Knee Extension at Wall with Ball  - 1 x daily - 7 x weekly - 1-2 sets - 10 reps - 5 sec hold   ASSESSMENT:   CLINICAL IMPRESSION: Reviewed HEP and recommended patient incorporate self-massage at home with roller stick to address quad cramping. Exercises focused on progressing quad activation and strength to improve total knee extension. Kinesiotape applied to address noted edema along medial knee on Rt LE; will follow-up at next visit on response with tape.    EVAL: Patient is a 69 y.o. male who was seen today for physical therapy evaluation and treatment s/p R TKA revision 11/11/24 following initial R TKA 04/23/23 with removal of replacement due to infection 10/24 with placement of articulating spaces until TKA 11/11/24. He presents with decreased ROM, strength, function R LE; extensor lag; decreased balance; abnormal gait pattern; decreased functional and recreational activity tolerance. Patient will benefit from PT treatment to address deficits identified.    OBJECTIVE IMPAIRMENTS: Abnormal gait, decreased balance, decreased mobility, decreased ROM, decreased strength, impaired flexibility, and pain.    ACTIVITY LIMITATIONS: carrying, lifting, bending, sitting, standing, squatting, and stairs   PARTICIPATION LIMITATIONS: community activity, yard work, and biking; hiking   PERSONAL FACTORS: Past/current experiences, Time since onset of injury/illness/exacerbation, and comorbidities as noted above are also affecting patient's functional outcome.    REHAB POTENTIAL: Good   CLINICAL DECISION  MAKING: Evolving/moderate complexity   EVALUATION COMPLEXITY: Moderate     GOALS: Goals reviewed with patient? Yes    SHORT TERM GOALS: Target date: 01/31/2025   Independent in initial HEP  Baseline: Goal status: INITIAL   2.  Increased strength R LE to 4+/5 to 5/5  Baseline:  Goal status: INITIAL   3.  No extensor lag R LE with knee extension supine  Baseline:  Goal status: INITIAL     LONG TERM GOALS: Target date: 02/28/2025   Normal gait pattern with equal wt bearing bilat LE's  Baseline:  Goal status: INITIAL   2.  Increase R knee flexion to 130 deg flexion and  o deg extension  Baseline:  Goal status: INITIAL   3.  5/5 strength R LE  Baseline:  Goal status: INITIAL   4.  Patient reports return to biking for 30-45 min and hiking ~ 5 miles with no increase in pain R knee  Baseline:  Goal status: INITIAL   5.  Improve LEFS by 10% indicating improved functional activity level  Baseline: 47/80; 58.8% Goal status: INITIAL   6.  Independent in advanced HEP  Baseline:  Goal status: INITIAL     PLAN:   PT FREQUENCY: 1-2x/week   PT DURATION: 8 weeks   PLANNED INTERVENTIONS: [id number]- PT Re-evaluation, 97110-Therapeutic exercises, 97530- Therapeutic activity, [id number]- Neuromuscular re-education, 97535- Self Care, [id number]- Manual therapy, [phone]- Gait training, [phone]- Aquatic Therapy, [phone]- Vasopneumatic device, Patient/Family education, Balance training, Stair training, Taping, and Joint mobilization   PLAN FOR NEXT SESSION: Response to kinesiotape? Progress total knee extension & quad strengthening; gait training and stair training as indicated     Lamarr Price, PTA 01/05/2025, 12:29 PM        "

## 2025-01-10 ENCOUNTER — Ambulatory Visit

## 2025-01-10 DIAGNOSIS — R600 Localized edema: Secondary | ICD-10-CM

## 2025-01-10 DIAGNOSIS — M6281 Muscle weakness (generalized): Secondary | ICD-10-CM

## 2025-01-10 DIAGNOSIS — M2560 Stiffness of unspecified joint, not elsewhere classified: Secondary | ICD-10-CM

## 2025-01-10 DIAGNOSIS — M25561 Pain in right knee: Secondary | ICD-10-CM

## 2025-01-10 DIAGNOSIS — R2689 Other abnormalities of gait and mobility: Secondary | ICD-10-CM

## 2025-01-10 DIAGNOSIS — Z471 Aftercare following joint replacement surgery: Secondary | ICD-10-CM | POA: Diagnosis not present

## 2025-01-10 NOTE — Therapy (Signed)
 " OUTPATIENT PHYSICAL THERAPY LOWER EXTREMITY TREATMENT     Patient Name: Tyler Gates. MRN: [id number] DOB:February 25, 1956, 69 y.o., male Today's Date: 01/03/2025   END OF SESSION:  PT End of Session - 01/10/25 1055     Visit Number 3    Number of Visits 16    Date for Recertification  02/28/25    Authorization Type humana    Authorization Time Period 12 visits approved for PT 01/03/2025-02/02/2025    Authorization - Visit Number 3    Authorization - Number of Visits 12    Progress Note Due on Visit 10    PT Start Time 1057    PT Stop Time 1152    PT Time Calculation (min) 55 min    Behavior During Therapy WFL for tasks assessed/performed               Past Medical History:  Diagnosis Date   Arthritis     Crohn's disease (HCC)     Depression     Dyspnea on exertion     GERD (gastroesophageal reflux disease)     Herpes     History of kidney stones     Hyperlipidemia     Hypertension     Instability of knee joint     Left displaced femoral neck fracture (HCC) 07/11/2017   OSA (obstructive sleep apnea)      mild   Snoring               Past Surgical History:  Procedure Laterality Date   COLONOSCOPY   2007    Dr. Zena normal TI, ascending colon/cecum with mild inflammation noted but biopses without signs of active inflammation, hyperpastic polyp, possible ischemia on path of left colon biopsy   COLONOSCOPY N/A 11/08/2015    Procedure: COLONOSCOPY;  Surgeon: Lamar CHRISTELLA Hollingshead, MD;  Location: [facility];  Service: Endoscopy;  Laterality: N/A;   ESOPHAGOGASTRODUODENOSCOPY   2007    Dr. Zena reflux esophagitis, hiatal hernia, negative H.pylori   ESOPHAGOGASTRODUODENOSCOPY N/A 11/08/2015    Procedure: ESOPHAGOGASTRODUODENOSCOPY (EGD);  Surgeon: Lamar CHRISTELLA Hollingshead, MD;  Location: [facility];  Service: Endoscopy;  Laterality: N/A;  0830 - moved to 8:15 - office to notify   EXCISIONAL TOTAL KNEE ARTHROPLASTY WITH ANTIBIOTIC SPACERS Right 10/01/2023    Procedure:  EXCISIONAL TOTAL KNEE ARTHROPLASTY WITH PLACEMENT OF ARTICULATING ANTIBIOTIC SPACER;  Surgeon: Fidel Rogue, MD;  Location: [facility];  Service: Orthopedics;  Laterality: Right;   HIP PINNING,CANNULATED Left 07/12/2017    Procedure: CANNULATED HIP PINNING LEFT;  Surgeon: Fidel Rogue, MD;  Location: [facility];  Service: Orthopedics;  Laterality: Left;   KNEE ARTHROPLASTY Right 04/23/2023    Procedure: COMPUTER ASSISTED TOTAL KNEE ARTHROPLASTY;  Surgeon: Fidel Rogue, MD;  Location: [facility];  Service: Orthopedics;  Laterality: Right;  160   TOTAL KNEE REVISION Right 11/11/2024    Procedure: TOTAL KNEE REVISION;  Surgeon: Fidel Rogue, MD;  Location: [facility];  Service: Orthopedics;  Laterality: Right;  Resection right knee scpart and Revision Right total knee arthroplasty            Patient Active Problem List    Diagnosis Date Noted   Eczema 12/28/2024   S/P revision of total knee, right 11/11/2024   Diarrhea 10/10/2023   Prosthetic joint infection 10/10/2023   Medication management 10/10/2023   Hypotension due to hypovolemia 10/10/2023   Infection of total right knee replacement, subsequent encounter 10/01/2023   Infection of prosthetic right knee  joint 09/22/2023   Infection of total right knee replacement 08/27/2023   Rash 08/01/2023   Body aches 08/01/2023   Osteoarthritis of right knee 04/23/2023   Preoperative clearance 02/11/2023   Chronic sinusitis 03/12/2022   Visual disturbance 03/12/2022   Acute sinusitis 01/29/2022   Chronic kidney disease (CKD) stage G2/A2, mildly decreased glomerular filtration rate (GFR) between 60-89 mL/min/1.73 square meter and albuminuria creatinine ratio between 30-299 mg/g 10/22/2018   Hypogonadism male 10/21/2018   Screening PSA (prostate specific antigen) 10/21/2018   Chronic kidney disease due to benign hypertension 10/21/2018   Depression, major, recurrent, in remission 10/21/2018   Statin intolerance 09/14/2018   Agatston coronary artery  calcium  score greater than 400 08/10/2018   Family history of coronary artery disease 08/10/2018   Crohn's disease of colon without complication (HCC) 05/09/2017   Mucosal abnormality of esophagus     Hiatal hernia     KNEE JOINT INSTABILITY 03/07/2007   Hyperlipidemia 12/30/2006   Essential hypertension 12/30/2006   GERD 12/30/2006      PCP: Dr Velma Ku   REFERRING PROVIDER: Dr Redell Shoals   REFERRING DIAG: aftercare following R TKA   THERAPY DIAG:  Acute pain of right knee   Stiffness in joint   Muscle weakness (generalized)   Localized edema   Other abnormalities of gait and mobility   Rationale for Evaluation and Treatment: Rehabilitation   ONSET DATE: 11/11/24   SUBJECTIVE:    SUBJECTIVE STATEMENT: Patient reports he saw PCP about fluid on knee and thinks it will resolve on itself; states he has appt with surgeon on 2/3. States he continued to ice and do HEP, states kinesiotape was cool but not sure it changed anything. States he did a 2 mile hike last week and had mild knee pain afterwards.   EVAL: Patient underwent R TKA 04/23/23 and was seen for rehab through 06/05/23. He was independent in HEP and riding his bike 8 miles a day when is finished therapy. He developed infection in R knee and had articulating spacer placed and treated with antibiotics until 10/24; then underwent TKA revision 11/11/24. He is doing well following TKA. He is ambulating without assistive device and doing well. He does notice there is loss of muscle. He has been riding his some since recent surgery. He has hiked 2+ miles with a cane.   PERTINENT HISTORY: HTN; Crones disease; fx of Lt femoral neck with ORIF ~ 5 yrs ago  PAIN:  Are you having pain? Yes: NPRS scale: 0/10 Pain location: R knee  Pain description: tight with use Aggravating factors: increased standing and walking  Relieving factors: rest    PRECAUTIONS: None   RED FLAGS: None      WEIGHT BEARING RESTRICTIONS:  No   FALLS:  Has patient fallen in last 6 months? Yes. 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   DIAGNOSTIC FINDINGS: xray 11/11/24 - Expected appearance after redo knee arthroplasty.    PATIENT SURVEYS:  LEFS: 47/80; 58.8%   COGNITION: Overall cognitive status: Within functional limits for tasks assessed                         SENSATION: Slight numbness lateral knee intermittently    EDEMA:  Area of edema; pocketing of tissue medial R knee which has persisted through 3 surgeries    MUSCLE LENGTH: Hamstrings: Right 65 deg; Left 65 deg Tight hip flexors R > L    POSTURE: rounded shoulders, forward head, flexed trunk , and weight shift left   PALPATION: Note clicking and increased lateral and medial translation of patella. Note area of spongy tissue medial knee just medial to patella    LOWER EXTREMITY ROM:   Active ROM Right eval Left eval Right 01/10/25  Hip flexion [facility]   Hip extension [facility]   Hip abduction       Hip adduction       Hip internal rotation       Hip external rotation       Knee flexion 124 130 127  Knee extension -4 +5 -2  Ankle dorsiflexion       Ankle plantarflexion       Ankle inversion       Ankle eversion        (Blank rows = not tested)   LOWER EXTREMITY MMT:                        Note extensor lag R LE with SLR ~ 10 deg  MMT Right eval Left eval  Hip flexion 4 5  Hip extension 4 5  Hip abduction 4 5  Hip adduction      Hip internal rotation      Hip external rotation      Knee flexion 4 5  Knee extension 4 5  Ankle dorsiflexion      Ankle plantarflexion      Ankle inversion      Ankle eversion       (Blank rows = not tested)   FUNCTIONAL TESTS:  5 times sit to stand: 15.81 sec with weight shift to L  SLS: R LE 8 sec with initial UE support; L LE 10 sec    GAIT: Distance walked: 40 ft Assistive device utilized: None Level of assistance: Complete Independence Comments: slight limp R LE in wt bearing without full extension of R knee in stance to push off phase       [facility] Adult PT Treatment:                                                DATE: 01/10/2025 Therapeutic Exercise: Recumbent bike L2 x 4 min + subjective intake Supine ITB stretch with strap Heel slide with orange PB in supine Prone quad stretch with strap --> towel under knee Neuromuscular re-ed: Quad set --> easy SAQ --> cramping at lateral thigh SAQ in slight ER Small range SLR in ER Supine bridges + block b/w knees x10 Bridges + blue TB around thighs x10  Side lying clamshells + blue TB x10 Side lying hip abduction -->  ER x10, IR x 10 Rt TKE in standing with black TB 10 x 5 sec Therapeutic Activity: Seated heel slides with stretch at end range Sit to stand in slightly staggered stance with Rt back x10 (arm reaching forward) 4 step runner's step up 2 step fwd heel tap down + bilateral railing support  Single leg balance 2 x 30 sec  Modalities: Vaso Rt knee, 34 degrees, med compression x 10 min    [facility] Adult PT Treatment:                                                DATE: 01/05/2025 Therapeutic Exercise: Recumbent bike L1 x 4 min warm-up Prone quad stretch with strap + towel roll above knee Roller stick for self-massage --> quads Manual Therapy: Kinesiotape for  edema (Rt) knee Neuromuscular re-ed: Quad set Small range SLR --> parallel & ER SAQ with bolster Standing TKE with ball  Therapeutic Activity: Seated heel slide for knee flexion Runner's step up on 4 step  Resisted hip abd + blue TB around thighs x12 Resisted hip extension + blue TB around thighs x12                                                                                                                                 PATIENT EDUCATION:  Education details: Updated HEP Person educated: Patient Education method: Explanation, Demonstration, Tactile cues, Verbal cues, and Handouts Education comprehension: verbalized understanding, returned demonstration, verbal cues required, tactile cues required, and needs further education   HOME EXERCISE PROGRAM: Access Code: [email or web address] URL: [email or web address] Date: 01/10/2025 Prepared by: Lamarr Price  Exercises - Prone Quadriceps Stretch with Strap  - 1 x daily - 7 x weekly - 1 sets - 3 reps - 30 sec  hold - Hooklying Hamstring Stretch with Strap  - 1 x daily - 7 x weekly - 1 sets - 3 reps - 30 sec  hold - Supine ITB Stretch with Strap  - 1 x daily - 7 x weekly - 1 sets - 3 reps - 30 sec  hold - Supine Quad Set  - 2 x daily - 7 x weekly - 1 sets - 10 reps - 3 sec  hold - Supine Knee Extension Strengthening  - 2 x daily - 7 x weekly - 1-2 sets - 10 reps - 5 sec  hold - Small Range Straight Leg Raise  - 2 x daily - 7 x weekly - 1 sets - 10 reps - 5 sec  hold - Sit to Stand Without Arm Support  - 2 x daily - 7 x weekly - 2 sets - 10 reps - Standing Terminal Knee Extension with Resistance  - 2 x daily - 7 x weekly - 1 sets - 10 reps - 5-10 sec hold -  Single Leg Stance  - 2 x daily - 7 x weekly - 1 sets - 3 reps - 30 sec hold   ASSESSMENT:   CLINICAL IMPRESSION:  Continued quad strengthening, gradually progression to upright weight bearing for closed-chain variations. Moderate unsteadiness demonstrated with single leg  balance, however no pain. Noted hip extension compensation with straight leg raise in supine; modifying with slight hip ER and breathing cue improved body mechanics. Steady improvement with knee AROM mobility.    EVAL: Patient is a 69 y.o. male who was seen today for physical therapy evaluation and treatment s/p R TKA revision 11/11/24 following initial R TKA 04/23/23 with removal of replacement due to infection 10/24 with placement of articulating spaces until TKA 11/11/24. He presents with decreased ROM, strength, function R LE; extensor lag; decreased balance; abnormal gait pattern; decreased functional and recreational activity tolerance. Patient will benefit from PT treatment to address deficits identified.    OBJECTIVE IMPAIRMENTS: Abnormal gait, decreased balance, decreased mobility, decreased ROM, decreased strength, impaired flexibility, and pain.    ACTIVITY LIMITATIONS: carrying, lifting, bending, sitting, standing, squatting, and stairs   PARTICIPATION LIMITATIONS: community activity, yard work, and biking; hiking   PERSONAL FACTORS: Past/current experiences, Time since onset of injury/illness/exacerbation, and comorbidities as noted above are also affecting patient's functional outcome.    REHAB POTENTIAL: Good   CLINICAL DECISION MAKING: Evolving/moderate complexity   EVALUATION COMPLEXITY: Moderate     GOALS: Goals reviewed with patient? Yes    SHORT TERM GOALS: Target date: 01/31/2025   Independent in initial HEP  Baseline: Goal status: INITIAL   2.  Increased strength R LE to 4+/5 to 5/5  Baseline:  Goal status: INITIAL   3.  No extensor lag R LE with knee extension supine  Baseline:  Goal status: INITIAL     LONG TERM GOALS: Target date: 02/28/2025   Normal gait pattern with equal wt bearing bilat LE's  Baseline:  Goal status: INITIAL   2.  Increase R knee flexion to 130 deg flexion and 0 deg extension  Baseline:  Goal status: INITIAL   3.  5/5 strength R  LE  Baseline:  Goal status: INITIAL   4.  Patient reports return to biking for 30-45 min and hiking ~ 5 miles with no increase in pain R knee  Baseline:  Goal status: INITIAL   5.  Improve LEFS by 10% indicating improved functional activity level  Baseline: 47/80; 58.8% Goal status: INITIAL   6.  Independent in advanced HEP  Baseline:  Goal status: INITIAL     PLAN:   PT FREQUENCY: 1-2x/week   PT DURATION: 8 weeks   PLANNED INTERVENTIONS: 97164- PT Re-evaluation, 97110-Therapeutic exercises, 97530- Therapeutic activity, 97112- Neuromuscular re-education, 97535- Self Care, [id number]- Manual therapy, [phone]- Gait training, [phone]- Aquatic Therapy, [phone]- Vasopneumatic device, Patient/Family education, Balance training, Stair training, Taping, and Joint mobilization   PLAN FOR NEXT SESSION: Progress quad strengthening; gait training and stair training as indicated     Lamarr Price, PTA 01/10/2025, 11:58 AM        "

## 2025-01-12 ENCOUNTER — Ambulatory Visit: Admitting: Rehabilitative and Restorative Service Providers"

## 2025-01-12 ENCOUNTER — Encounter: Payer: Self-pay | Admitting: Rehabilitative and Restorative Service Providers"

## 2025-01-12 DIAGNOSIS — Z471 Aftercare following joint replacement surgery: Secondary | ICD-10-CM | POA: Diagnosis not present

## 2025-01-12 DIAGNOSIS — R2689 Other abnormalities of gait and mobility: Secondary | ICD-10-CM

## 2025-01-12 DIAGNOSIS — M2560 Stiffness of unspecified joint, not elsewhere classified: Secondary | ICD-10-CM

## 2025-01-12 DIAGNOSIS — M6281 Muscle weakness (generalized): Secondary | ICD-10-CM

## 2025-01-12 DIAGNOSIS — M25561 Pain in right knee: Secondary | ICD-10-CM

## 2025-01-12 DIAGNOSIS — R600 Localized edema: Secondary | ICD-10-CM

## 2025-01-12 NOTE — Therapy (Signed)
 " OUTPATIENT PHYSICAL THERAPY LOWER EXTREMITY TREATMENT     Patient Name: Tyler Gates. MRN: [id number] DOB:1956/04/17, 69 y.o., male Today's Date: 01/03/2025   END OF SESSION:  PT End of Session - 01/12/25 1029     Visit Number 4    Number of Visits 16    Date for Recertification  02/28/25    Authorization Type humana    Authorization Time Period 12 visits approved for PT 01/03/2025-02/02/2025    Authorization - Visit Number 4    Authorization - Number of Visits 12    Progress Note Due on Visit 10    PT Start Time 1020    PT Stop Time 1110    PT Time Calculation (min) 50 min    Activity Tolerance Patient tolerated treatment well               Past Medical History:  Diagnosis Date   Arthritis     Crohn's disease (HCC)     Depression     Dyspnea on exertion     GERD (gastroesophageal reflux disease)     Herpes     History of kidney stones     Hyperlipidemia     Hypertension     Instability of knee joint     Left displaced femoral neck fracture (HCC) 07/11/2017   OSA (obstructive sleep apnea)      mild   Snoring               Past Surgical History:  Procedure Laterality Date   COLONOSCOPY   2007    Dr. Zena normal TI, ascending colon/cecum with mild inflammation noted but biopses without signs of active inflammation, hyperpastic polyp, possible ischemia on path of left colon biopsy   COLONOSCOPY N/A 11/08/2015    Procedure: COLONOSCOPY;  Surgeon: Lamar CHRISTELLA Hollingshead, MD;  Location: [facility];  Service: Endoscopy;  Laterality: N/A;   ESOPHAGOGASTRODUODENOSCOPY   2007    Dr. Zena reflux esophagitis, hiatal hernia, negative H.pylori   ESOPHAGOGASTRODUODENOSCOPY N/A 11/08/2015    Procedure: ESOPHAGOGASTRODUODENOSCOPY (EGD);  Surgeon: Lamar CHRISTELLA Hollingshead, MD;  Location: [facility];  Service: Endoscopy;  Laterality: N/A;  0830 - moved to 8:15 - office to notify   EXCISIONAL TOTAL KNEE ARTHROPLASTY WITH ANTIBIOTIC SPACERS Right 10/01/2023    Procedure: EXCISIONAL  TOTAL KNEE ARTHROPLASTY WITH PLACEMENT OF ARTICULATING ANTIBIOTIC SPACER;  Surgeon: Fidel Rogue, MD;  Location: [facility];  Service: Orthopedics;  Laterality: Right;   HIP PINNING,CANNULATED Left 07/12/2017    Procedure: CANNULATED HIP PINNING LEFT;  Surgeon: Fidel Rogue, MD;  Location: [facility];  Service: Orthopedics;  Laterality: Left;   KNEE ARTHROPLASTY Right 04/23/2023    Procedure: COMPUTER ASSISTED TOTAL KNEE ARTHROPLASTY;  Surgeon: Fidel Rogue, MD;  Location: [facility];  Service: Orthopedics;  Laterality: Right;  160   TOTAL KNEE REVISION Right 11/11/2024    Procedure: TOTAL KNEE REVISION;  Surgeon: Fidel Rogue, MD;  Location: [facility];  Service: Orthopedics;  Laterality: Right;  Resection right knee scpart and Revision Right total knee arthroplasty            Patient Active Problem List    Diagnosis Date Noted   Eczema 12/28/2024   S/P revision of total knee, right 11/11/2024   Diarrhea 10/10/2023   Prosthetic joint infection 10/10/2023   Medication management 10/10/2023   Hypotension due to hypovolemia 10/10/2023   Infection of total right knee replacement, subsequent encounter 10/01/2023   Infection of prosthetic right knee joint  09/22/2023   Infection of total right knee replacement 08/27/2023   Rash 08/01/2023   Body aches 08/01/2023   Osteoarthritis of right knee 04/23/2023   Preoperative clearance 02/11/2023   Chronic sinusitis 03/12/2022   Visual disturbance 03/12/2022   Acute sinusitis 01/29/2022   Chronic kidney disease (CKD) stage G2/A2, mildly decreased glomerular filtration rate (GFR) between 60-89 mL/min/1.73 square meter and albuminuria creatinine ratio between 30-299 mg/g 10/22/2018   Hypogonadism male 10/21/2018   Screening PSA (prostate specific antigen) 10/21/2018   Chronic kidney disease due to benign hypertension 10/21/2018   Depression, major, recurrent, in remission 10/21/2018   Statin intolerance 09/14/2018   Agatston coronary artery calcium  score  greater than 400 08/10/2018   Family history of coronary artery disease 08/10/2018   Crohn's disease of colon without complication (HCC) 05/09/2017   Mucosal abnormality of esophagus     Hiatal hernia     KNEE JOINT INSTABILITY 03/07/2007   Hyperlipidemia 12/30/2006   Essential hypertension 12/30/2006   GERD 12/30/2006      PCP: Dr Velma Ku   REFERRING PROVIDER: Dr Redell Shoals   REFERRING DIAG: aftercare following R TKA   THERAPY DIAG:  Acute pain of right knee   Stiffness in joint   Muscle weakness (generalized)   Localized edema   Other abnormalities of gait and mobility   Rationale for Evaluation and Treatment: Rehabilitation   ONSET DATE: 11/11/24   SUBJECTIVE:    SUBJECTIVE STATEMENT: Patient reports that he can tell he worked his knee the day after therapy and doesn't do much with his exercises that day. Still making progress. The hardest exercise was stepping down with the L LE - can tell the L leg is working and weak.    EVAL: Patient underwent R TKA 04/23/23 and was seen for rehab through 06/05/23. He was independent in HEP and riding his bike 8 miles a day when is finished therapy. He developed infection in R knee and had articulating spacer placed and treated with antibiotics until 10/24; then underwent TKA revision 11/11/24. He is doing well following TKA. He is ambulating without assistive device and doing well. He does notice there is loss of muscle. He has been riding his some since recent surgery. He has hiked 2+ miles with a cane.   PERTINENT HISTORY: HTN; Crones disease; fx of Lt femoral neck with ORIF ~ 5 yrs ago  PAIN:  Are you having pain? Yes: NPRS scale: 0/10 Pain location: R knee  Pain description: tight with use Aggravating factors: increased standing and walking  Relieving factors: rest    PRECAUTIONS: None     WEIGHT BEARING RESTRICTIONS: No   FALLS:  Has patient fallen in last 6 months? Yes. 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   DIAGNOSTIC FINDINGS: xray 11/11/24 - Expected  appearance after redo knee arthroplasty.    PATIENT SURVEYS:  LEFS: 47/80; 58.8%                SENSATION: Slight numbness lateral knee intermittently    EDEMA:  Area of edema; pocketing of tissue medial R knee which has persisted through 3 surgeries    MUSCLE LENGTH: Hamstrings: Right 65 deg; Left 65 deg Tight hip flexors R > L    POSTURE: rounded shoulders, forward head, flexed trunk , and weight shift left   PALPATION: Note clicking and increased lateral and medial translation of patella. Note area of spongy tissue medial knee just medial to patella    LOWER EXTREMITY ROM:   Active ROM Right eval Left eval Right 01/10/25  Hip flexion [facility]   Hip extension [facility]   Hip abduction       Hip adduction       Hip internal rotation       Hip external rotation       Knee flexion 124 130 127  Knee extension -4 +5 -2  Ankle dorsiflexion       Ankle plantarflexion       Ankle inversion       Ankle eversion        (Blank rows = not tested)   LOWER EXTREMITY MMT:                       Note extensor lag R LE with SLR ~ 10 deg  MMT Right eval Left eval  Hip flexion 4 5  Hip extension 4 5  Hip abduction 4 5  Hip adduction      Hip internal rotation      Hip external rotation       Knee flexion 4 5  Knee extension 4 5  Ankle dorsiflexion      Ankle plantarflexion      Ankle inversion      Ankle eversion       (Blank rows = not tested)   FUNCTIONAL TESTS:  5 times sit to stand: 15.81 sec with weight shift to L  SLS: R LE 8 sec with initial UE support; L LE 10 sec    GAIT: Distance walked: 40 ft Assistive device utilized: None Level of assistance: Complete Independence Comments: slight limp R LE in wt bearing without full extension of R knee in stance to push off phase      [facility] Adult PT Treatment:                                                DATE: 01/12/2025 Therapeutic Exercise: Recumbent bike L2 x 6 min for ROM and strength Knee flexion with green PB in supine Supine knee extension with LE's resting on green therapeutic ball  Neuromuscular re-ed: Quad set --> easy SAQ on bolster  Small range SLR in ER Supine bridges + block b/w knees x10 Bridges + blue TB around thighs x10  Hip abduction in hooklying blue TB alternating LE's x 10 R/L  Side lying hip abduction --> ER x10, IR x 10 Rt TKE in standing with black TB 10 x 5 sec Therapeutic Activity: Sit to stand in slightly staggered stance with Rt back x10 (arm reaching forward) 2 step fwd heel tap down + bilateral railing support x 5 Modalities:  Modalities: Vaso Rt knee, 34 degrees, med compression x 10 min   [facility] Adult PT Treatment:                                                DATE: 01/10/2025 Therapeutic Exercise: Recumbent bike L2 x 4 min + subjective intake Supine ITB stretch with strap Heel slide with orange PB in supine Prone quad stretch with strap --> towel under knee Neuromuscular re-ed: Quad set --> easy SAQ --> cramping at lateral thigh SAQ in slight ER Small range SLR in ER Supine bridges + block b/w knees x10 Bridges + blue TB around thighs x10  Side lying clamshells + blue TB x10 Side lying hip abduction --> ER x10, IR x 10 Rt TKE in standing with black TB 10 x 5  sec Therapeutic Activity: Seated heel slides with stretch at end range Sit to stand in slightly staggered stance with Rt back x10 (arm reaching forward) 4 step runner's step up 2 step fwd heel tap down + bilateral railing support  Single leg balance 2 x 30 sec  Modalities: Vaso Rt knee, 34 degrees, med compression x 10 min    [facility] Adult PT Treatment:                                                DATE: 01/05/2025 Therapeutic Exercise: Recumbent bike L1 x 4 min warm-up Prone quad stretch with strap + towel roll above knee Roller stick for self-massage --> quads Manual Therapy: Kinesiotape for edema (Rt) knee Neuromuscular re-ed: Quad set Small range SLR --> parallel & ER SAQ with bolster Standing TKE with ball  Therapeutic Activity: Seated heel slide for knee flexion Runner's step up on 4 step  Resisted hip abd + blue TB around thighs x12 Resisted hip extension + blue TB around thighs x12                                                                                                                                 PATIENT EDUCATION:  Education details: Updated HEP Person educated: Patient Education method: Explanation, Demonstration, Tactile cues, Verbal cues, and Handouts Education comprehension: verbalized understanding, returned demonstration, verbal cues required, tactile cues required, and needs further education   HOME EXERCISE PROGRAM: Access Code: [email or web address] URL: [email or web address] Date: 01/10/2025 Prepared by: Lamarr Price  Exercises - Prone Quadriceps Stretch with Strap  - 1 x daily - 7 x weekly - 1 sets - 3 reps - 30 sec  hold - Hooklying Hamstring Stretch with Strap  - 1 x daily - 7 x weekly - 1 sets - 3 reps - 30 sec  hold - Supine ITB Stretch  with Strap  - 1 x daily - 7 x weekly - 1 sets - 3 reps - 30 sec  hold - Supine Quad Set  - 2 x daily - 7 x weekly - 1 sets - 10 reps - 3 sec  hold - Supine Knee Extension Strengthening  - 2 x daily  - 7 x weekly - 1-2 sets - 10 reps - 5 sec  hold - Small Range Straight Leg Raise  - 2 x daily - 7 x weekly - 1 sets - 10 reps - 5 sec  hold - Sit to Stand Without Arm Support  - 2 x daily - 7 x weekly - 2 sets - 10 reps - Standing Terminal Knee Extension with Resistance  - 2 x daily - 7 x weekly - 1 sets - 10 reps - 5-10 sec hold - Single Leg Stance  - 2 x daily - 7 x weekly - 1 sets - 3 reps - 30 sec hold   ASSESSMENT:   CLINICAL IMPRESSION: Tyler Gates is progressing well with R TKA revision. He demonstrates increased ROM and strength in R LE. He does continue to have an extensor lag with R knee extension. Continued with quad strengthening in open and closed chain activities. Patient reports fatigue post exercises.     EVAL: Patient is a 69 y.o. male who was seen today for physical therapy evaluation and treatment s/p R TKA revision 11/11/24 following initial R TKA 04/23/23 with removal of replacement due to infection 10/24 with placement of articulating spaces until TKA 11/11/24. He presents with decreased ROM, strength, function R LE; extensor lag; decreased balance; abnormal gait pattern; decreased functional and recreational activity tolerance. Patient will benefit from PT treatment to address deficits identified.    OBJECTIVE IMPAIRMENTS: Abnormal gait, decreased balance, decreased mobility, decreased ROM, decreased strength, impaired flexibility, and pain.      GOALS: Goals reviewed with patient? Yes    SHORT TERM GOALS: Target date: 01/31/2025   Independent in initial HEP  Baseline: Goal status: INITIAL   2.  Increased strength R LE to 4+/5 to 5/5  Baseline:  Goal status: INITIAL   3.  No extensor lag R LE with knee extension supine  Baseline:  Goal status: INITIAL     LONG TERM GOALS: Target date: 02/28/2025   Normal gait pattern with equal wt bearing bilat LE's  Baseline:  Goal status: INITIAL   2.  Increase R knee flexion to 130 deg flexion and 0 deg extension  Baseline:   Goal status: INITIAL   3.  5/5 strength R LE  Baseline:  Goal status: INITIAL   4.  Patient reports return to biking for 30-45 min and hiking ~ 5 miles with no increase in pain R knee  Baseline:  Goal status: INITIAL   5.  Improve LEFS by 10% indicating improved functional activity level  Baseline: 47/80; 58.8% Goal status: INITIAL   6.  Independent in advanced HEP  Baseline:  Goal status: INITIAL     PLAN:   PT FREQUENCY: 1-2x/week   PT DURATION: 8 weeks   PLANNED INTERVENTIONS: 97164- PT Re-evaluation, 97110-Therapeutic exercises, 97530- Therapeutic activity, 97112- Neuromuscular re-education, 97535- Self Care, [id number]- Manual therapy, [phone]- Gait training, [phone]- Aquatic Therapy, [phone]- Vasopneumatic device, Patient/Family education, Balance training, Stair training, Taping, and Joint mobilization   PLAN FOR NEXT SESSION: Progress quad strengthening; gait training and stair training as indicated     Jolynn Bajorek P. Ina PT, MPH 01/12/25 11:04 AM       "

## 2025-01-17 ENCOUNTER — Ambulatory Visit

## 2025-01-18 ENCOUNTER — Telehealth: Payer: Self-pay | Admitting: Pharmacy Technician

## 2025-01-18 NOTE — Telephone Encounter (Signed)
 PAP;  APPROVED  Received notice from [facility] patient that have already been established on Remicade  will be grand-fathered in and will not have to change treatment. Patient will continue/remain on Remicade .  Tyler Gates

## 2025-01-19 ENCOUNTER — Ambulatory Visit: Admitting: Rehabilitative and Restorative Service Providers"

## 2025-01-24 ENCOUNTER — Encounter: Payer: Self-pay | Admitting: Gastroenterology

## 2025-01-24 ENCOUNTER — Ambulatory Visit

## 2025-01-26 ENCOUNTER — Ambulatory Visit: Admitting: Cardiology

## 2025-02-04 ENCOUNTER — Ambulatory Visit

## 2025-03-14 ENCOUNTER — Ambulatory Visit: Admitting: Cardiology

## 2025-08-23 ENCOUNTER — Ambulatory Visit
